# Patient Record
Sex: Female | Born: 1960 | Race: White | Hispanic: No | Marital: Married | Smoking: Former smoker
Health system: Southern US, Community
[De-identification: ages and names within clinical notes are randomized; demographics above are authoritative.]

## PROBLEM LIST (undated history)

## (undated) DIAGNOSIS — E739 Lactose intolerance, unspecified: Secondary | ICD-10-CM

## (undated) DIAGNOSIS — D649 Anemia, unspecified: Secondary | ICD-10-CM

## (undated) DIAGNOSIS — R0602 Shortness of breath: Secondary | ICD-10-CM

## (undated) DIAGNOSIS — E039 Hypothyroidism, unspecified: Secondary | ICD-10-CM

## (undated) DIAGNOSIS — E785 Hyperlipidemia, unspecified: Secondary | ICD-10-CM

## (undated) DIAGNOSIS — E538 Deficiency of other specified B group vitamins: Secondary | ICD-10-CM

## (undated) DIAGNOSIS — J189 Pneumonia, unspecified organism: Secondary | ICD-10-CM

## (undated) DIAGNOSIS — R011 Cardiac murmur, unspecified: Secondary | ICD-10-CM

## (undated) DIAGNOSIS — F410 Panic disorder [episodic paroxysmal anxiety] without agoraphobia: Secondary | ICD-10-CM

## (undated) DIAGNOSIS — E559 Vitamin D deficiency, unspecified: Secondary | ICD-10-CM

## (undated) DIAGNOSIS — R002 Palpitations: Secondary | ICD-10-CM

## (undated) DIAGNOSIS — I739 Peripheral vascular disease, unspecified: Secondary | ICD-10-CM

## (undated) DIAGNOSIS — M1990 Unspecified osteoarthritis, unspecified site: Secondary | ICD-10-CM

## (undated) DIAGNOSIS — I491 Atrial premature depolarization: Secondary | ICD-10-CM

## (undated) DIAGNOSIS — T4145XA Adverse effect of unspecified anesthetic, initial encounter: Secondary | ICD-10-CM

## (undated) DIAGNOSIS — K635 Polyp of colon: Secondary | ICD-10-CM

## (undated) DIAGNOSIS — G4730 Sleep apnea, unspecified: Secondary | ICD-10-CM

## (undated) DIAGNOSIS — I10 Essential (primary) hypertension: Secondary | ICD-10-CM

## (undated) HISTORY — DX: Palpitations: R00.2

## (undated) HISTORY — DX: Polyp of colon: K63.5

## (undated) HISTORY — DX: Hyperlipidemia, unspecified: E78.5

## (undated) HISTORY — PX: COLONOSCOPY: SHX174

## (undated) HISTORY — PX: OOPHORECTOMY: SHX86

## (undated) HISTORY — DX: Vitamin D deficiency, unspecified: E55.9

## (undated) HISTORY — DX: Deficiency of other specified B group vitamins: E53.8

## (undated) HISTORY — DX: Atrial premature depolarization: I49.1

---

## 1983-07-12 HISTORY — PX: APPENDECTOMY: SHX54

## 1989-07-11 DIAGNOSIS — T8859XA Other complications of anesthesia, initial encounter: Secondary | ICD-10-CM

## 1989-07-11 DIAGNOSIS — T4145XA Adverse effect of unspecified anesthetic, initial encounter: Secondary | ICD-10-CM

## 1989-07-11 HISTORY — DX: Adverse effect of unspecified anesthetic, initial encounter: T41.45XA

## 1989-07-11 HISTORY — DX: Other complications of anesthesia, initial encounter: T88.59XA

## 1997-07-11 HISTORY — PX: PLANTAR FASCIA SURGERY: SHX746

## 1999-07-12 HISTORY — PX: VAGINAL HYSTERECTOMY: SUR661

## 2005-04-28 ENCOUNTER — Ambulatory Visit: Payer: Self-pay | Admitting: Internal Medicine

## 2005-10-10 ENCOUNTER — Ambulatory Visit: Payer: Self-pay | Admitting: Internal Medicine

## 2006-03-02 ENCOUNTER — Ambulatory Visit: Payer: Self-pay | Admitting: General Practice

## 2006-04-20 ENCOUNTER — Ambulatory Visit: Payer: Self-pay | Admitting: Internal Medicine

## 2006-06-10 HISTORY — PX: ROUX-EN-Y GASTRIC BYPASS: SHX1104

## 2007-08-07 ENCOUNTER — Ambulatory Visit: Payer: Self-pay | Admitting: General Practice

## 2007-08-13 ENCOUNTER — Ambulatory Visit: Payer: Self-pay | Admitting: General Practice

## 2008-01-31 ENCOUNTER — Ambulatory Visit: Payer: Self-pay | Admitting: General Practice

## 2008-08-26 ENCOUNTER — Ambulatory Visit: Payer: Self-pay | Admitting: General Practice

## 2009-09-02 ENCOUNTER — Ambulatory Visit: Payer: Self-pay | Admitting: Internal Medicine

## 2010-03-24 ENCOUNTER — Ambulatory Visit: Payer: Self-pay | Admitting: Internal Medicine

## 2010-09-07 ENCOUNTER — Ambulatory Visit: Payer: Self-pay | Admitting: Internal Medicine

## 2010-12-09 ENCOUNTER — Ambulatory Visit: Payer: Self-pay | Admitting: General Practice

## 2010-12-30 ENCOUNTER — Encounter: Payer: Self-pay | Admitting: Rehabilitation

## 2011-01-09 ENCOUNTER — Encounter: Payer: Self-pay | Admitting: Rehabilitation

## 2011-02-09 ENCOUNTER — Encounter: Payer: Self-pay | Admitting: Rehabilitation

## 2011-05-03 ENCOUNTER — Ambulatory Visit: Payer: Self-pay | Admitting: Gastroenterology

## 2011-05-05 LAB — PATHOLOGY REPORT

## 2011-10-31 ENCOUNTER — Encounter: Payer: Self-pay | Admitting: Cardiovascular Disease

## 2011-11-14 ENCOUNTER — Encounter: Payer: Self-pay | Admitting: Cardiovascular Disease

## 2011-11-15 ENCOUNTER — Ambulatory Visit: Payer: Self-pay | Admitting: General Practice

## 2011-11-15 ENCOUNTER — Encounter: Payer: Self-pay | Admitting: Cardiovascular Disease

## 2011-11-17 ENCOUNTER — Encounter: Payer: Self-pay | Admitting: Cardiovascular Disease

## 2011-11-17 ENCOUNTER — Ambulatory Visit: Payer: Self-pay | Admitting: General Practice

## 2011-12-14 ENCOUNTER — Ambulatory Visit: Payer: Self-pay | Admitting: Internal Medicine

## 2012-01-02 ENCOUNTER — Encounter: Payer: Self-pay | Admitting: Cardiovascular Disease

## 2012-01-02 ENCOUNTER — Ambulatory Visit (INDEPENDENT_AMBULATORY_CARE_PROVIDER_SITE_OTHER): Payer: BC Managed Care – PPO | Admitting: Cardiovascular Disease

## 2012-01-02 VITALS — BP 122/78 | HR 67 | Ht 66.0 in | Wt 262.0 lb

## 2012-01-02 DIAGNOSIS — R609 Edema, unspecified: Secondary | ICD-10-CM

## 2012-01-02 DIAGNOSIS — R600 Localized edema: Secondary | ICD-10-CM

## 2012-01-02 DIAGNOSIS — R002 Palpitations: Secondary | ICD-10-CM

## 2012-01-02 NOTE — Progress Notes (Signed)
History of Present Illness: 51 yo WF with history of palpitations who is here today as a new patient for evaluation of her palpitations.  She has recently undergone cardiac testing arranged by her primary doctor in [place]. She has not seen cardiology at [facility]. She has undergone a Holter monitor on 11/17/11 which showed NSR with occasional PACs. Exercise stress myoview on 11/17/11 with no evidence of ischemia. LVEF was 72%. She exercised for 6 minutes achieving target heart rate. D-dimer was elevated at 0.7. Lower extremity venous dopplers were negative for DVT.   She endorses palpitations for many years. Feels her heart racing. Has worn several monitors over the years, most recently this showed PACs. No chest pain or SOB. No near syncope or syncope. She does endorse chronic, daily lower ext edema that improves at night.   Primary Care Physician: Jabier Mutton  Last Lipid Profile:10/31/11:  TC: 278 HDL: 73 LDL: 180 TG: 125  Past Medical History  Diagnosis Date  . Palpitations   . Hyperlipidemia     Past Surgical History  Procedure Date  . 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 Plantar fascia surgery 1999  . Cesarean section 1987    twins  . Appendectomy 1985  . Vaginal hysterectomy 2001    has 1 ovary left    Current Outpatient Prescriptions  Medication Sig Dispense Refill  . diltiazem (CARDIZEM CD) 180 MG 24 hr capsule Take 180 mg by mouth daily.      Marland Kitchen levothyroxine (SYNTHROID, LEVOTHROID) 100 MCG tablet Take 1 tablet by mouth daily.      . naproxen sodium (ANAPROX) 220 MG tablet Take 220 mg by mouth as needed.      . traMADol (ULTRAM) 50 MG tablet Take 1 tablet by mouth Three times daily as needed.      . Vitamin D, Ergocalciferol, (DRISDOL) 50000 UNITS CAPS Take 1 capsule by mouth Once a week.      . zolpidem (AMBIEN) 10 MG tablet Take 1 tablet by mouth as needed.        No Known Allergies  History   Social History  . Marital Status: Married    Spouse Name: N/A      Number of Children: 2  . Years of Education: N/A   Occupational History  . parking enforcement [employer]   Social History Main Topics  . Smoking status: Former Smoker -- 0.3 packs/day for 4 years    Quit date: 07/11/2002  . Smokeless tobacco: Not on file  . Alcohol Use: 0.5 oz/week    1 drink(s) per week     occasional beer and wine  . Drug Use: No  . Sexually Active: Not on file   Other Topics Concern  . Not on file   Social History Narrative  . No narrative on file    Family History  Problem Relation Age of Onset  . Heart disease Mother   . Heart disease Father   . Lung cancer Mother   . Arrhythmia Sister   . Diabetes Mother     later in life  . Coronary artery disease Mother     Review of Systems:  As stated in the HPI and otherwise negative.   BP 122/78  Pulse 67  Ht 5\' 6"  (1.676 m)  Wt 262 lb (118.842 kg)  BMI 42.29 kg/m2  Physical Examination: General: Well developed, well nourished, NAD HEENT: OP clear, mucus membranes moist SKIN: warm, dry. No rashes. Neuro: No focal deficits Musculoskeletal:  Muscle strength 5/5 all ext Psychiatric: Mood and affect normal Neck: No JVD, no carotid bruits, no thyromegaly, no lymphadenopathy. Lungs:Clear bilaterally, no wheezes, rhonci, crackles Cardiovascular: Regular rate and rhythm. No murmurs, gallops or rubs. Abdomen:Soft. Bowel sounds present. Non-tender.  Extremities: No lower extremity edema. Pulses are 2 + in the bilateral DP/PT.  EKG: NSR, rate 67 bpm.

## 2012-01-02 NOTE — Assessment & Plan Note (Signed)
Likely dependent edema. I have asked her to avoid salty foods, elevated legs and consider compression stockings. LE venous dopplers negative for DVT.

## 2012-01-02 NOTE — Patient Instructions (Addendum)
Your physician wants you to follow-up in:  6 months. You will receive a reminder letter in the mail two months in advance. If you don't receive a letter, please call our office to schedule the follow-up appointment.   

## 2012-01-02 NOTE — Assessment & Plan Note (Signed)
Likely due to PACs. She did not have any episodes while wearing her monitor so she could be having SVT. Symptoms are much better since she started Cardizem. LV function is normal on stress myoview. I have reassured her. She is asked to avoid any stimulants including caffeine, nicotine, over the conter medications with stimulants. If she has recurrence of symptoms, will set her up to wear a 21 day monitor.

## 2012-08-09 ENCOUNTER — Telehealth: Payer: Self-pay | Admitting: Cardiovascular Disease

## 2012-08-09 DIAGNOSIS — R002 Palpitations: Secondary | ICD-10-CM

## 2012-08-09 NOTE — Telephone Encounter (Signed)
Spoke with pt. She last saw Dr. Clifton James in June 2013. Plan was to have pt wear 21 day event monitor if symptoms reoccur and see her in 6 months.  She reports palpitations have increased in last couple of months.  Will arrange 21 day event monitor.  Will cancel appt scheduled for 6 month follow up on Feb. 5th and schedule for time when results of monitor will be available.

## 2012-08-09 NOTE — Telephone Encounter (Signed)
Agree. Thanks, chris 

## 2012-08-09 NOTE — Telephone Encounter (Signed)
Pt calling re poss having a event monitor, has dental appt at three so can be reached after 4p or before 300pm

## 2012-08-14 ENCOUNTER — Ambulatory Visit: Payer: BC Managed Care – PPO | Admitting: Cardiovascular Disease

## 2012-08-14 NOTE — Telephone Encounter (Signed)
Monitor appt --Feb. 6, 2014. Appt with Dr. Clifton James on September 17, 2012

## 2012-08-15 ENCOUNTER — Ambulatory Visit: Payer: BC Managed Care – PPO | Admitting: Cardiovascular Disease

## 2012-08-16 ENCOUNTER — Ambulatory Visit (INDEPENDENT_AMBULATORY_CARE_PROVIDER_SITE_OTHER): Payer: BC Managed Care – PPO

## 2012-08-16 DIAGNOSIS — R002 Palpitations: Secondary | ICD-10-CM

## 2012-08-16 NOTE — Progress Notes (Signed)
Placed a 30 day event monitor on patient and went over instructions on how to use it and when to return it 

## 2012-09-13 ENCOUNTER — Ambulatory Visit: Payer: BC Managed Care – PPO | Admitting: Cardiology

## 2012-09-17 ENCOUNTER — Ambulatory Visit (INDEPENDENT_AMBULATORY_CARE_PROVIDER_SITE_OTHER): Payer: BC Managed Care – PPO | Admitting: Cardiovascular Disease

## 2012-09-17 ENCOUNTER — Encounter: Payer: Self-pay | Admitting: Cardiovascular Disease

## 2012-09-17 VITALS — BP 118/80 | HR 64 | Ht 66.0 in | Wt 256.0 lb

## 2012-09-17 DIAGNOSIS — R600 Localized edema: Secondary | ICD-10-CM

## 2012-09-17 DIAGNOSIS — R002 Palpitations: Secondary | ICD-10-CM

## 2012-09-17 DIAGNOSIS — I491 Atrial premature depolarization: Secondary | ICD-10-CM

## 2012-09-17 DIAGNOSIS — R609 Edema, unspecified: Secondary | ICD-10-CM

## 2012-09-17 NOTE — Progress Notes (Signed)
History of Present Illness: 52 yo WF with history of palpitations who is here today for cardiac follow up. I saw her as a new patient for evaluation of her palpitations in June 2013. She had undergone cardiac testing arranged by her primary doctor in [place] in the spring of 2013. She had not seen cardiology at [facility]. Holter monitor on 11/17/11 showed NSR with occasional PACs. Exercise stress myoview on 11/17/11 with no evidence of ischemia. LVEF was 72%. She exercised for 6 minutes achieving target heart rate. D-dimer was elevated at 0.7. Lower extremity venous dopplers were negative for DVT.  She endorsed palpitations for many years. We planned to continue Cardizem and only pursue a 21 day event monitor if symptoms increased in frequency or severity.   She is here today for follow up. She called in last month with c/o worsened symptoms and we arranged an event monitor. This is not available yet for review. She describes almost daily symptoms, lasting 30 minutes with HR 140s. Mild chest tightness with palpitations. No near syncope or syncope.   Primary Care Physician: Jabier Mutton    Past Medical History  Diagnosis Date  . Palpitations   . Hyperlipidemia   . PAC (premature atrial contraction)     Past Surgical History  Procedure Laterality Date  . 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 Plantar fascia surgery  1999  . Cesarean section  1987    twins  . Appendectomy  1985  . Vaginal hysterectomy  2001    has 1 ovary left    Current Outpatient Prescriptions  Medication Sig Dispense Refill  . diltiazem (CARDIZEM CD) 180 MG 24 hr capsule Take 180 mg by mouth daily.      Marland Kitchen levothyroxine (SYNTHROID, LEVOTHROID) 100 MCG tablet Take 1 tablet by mouth daily.      . naproxen sodium (ANAPROX) 220 MG tablet Take 220 mg by mouth as needed.      . traMADol (ULTRAM) 50 MG tablet Take 1 tablet by mouth Three times daily as needed.      . traZODone (DESYREL) 50 MG tablet Take 50 mg by mouth  at bedtime.      . Vitamin D, Ergocalciferol, (DRISDOL) 50000 UNITS CAPS Take 1 capsule by mouth Once a week.      . zolpidem (AMBIEN) 5 MG tablet Take 5 mg by mouth at bedtime as needed for sleep.       No current facility-administered medications for this visit.    No Known Allergies  History   Social History  . Marital Status: Married    Spouse Name: N/A    Number of Children: 2  . Years of Education: N/A   Occupational History  . parking enforcement [employer]   Social History Main Topics  . Smoking status: Former Smoker -- 0.30 packs/day for 4 years    Quit date: 07/11/2002  . Smokeless tobacco: Not on file  . Alcohol Use: 0.5 oz/week    1 drink(s) per week     Comment: occasional beer and wine  . Drug Use: No  . Sexually Active: Not on file   Other Topics Concern  . Not on file   Social History Narrative  . No narrative on file    Family History  Problem Relation Age of Onset  . Heart disease Mother   . Heart disease Father   . Lung cancer Mother   . Arrhythmia Sister   . Diabetes Mother  later in life  . Coronary artery disease Mother     Review of Systems:  As stated in the HPI and otherwise negative.   BP 118/80  Pulse 64  Ht 5\' 6"  (1.676 m)  Wt 256 lb (116.121 kg)  BMI 41.34 kg/m2  Physical Examination: General: Well developed, well nourished, NAD HEENT: OP clear, mucus membranes moist SKIN: warm, dry. No rashes. Neuro: No focal deficits Musculoskeletal: Muscle strength 5/5 all ext Psychiatric: Mood and affect normal Neck: No JVD, no carotid bruits, no thyromegaly, no lymphadenopathy. Lungs:Clear bilaterally, no wheezes, rhonci, crackles Cardiovascular: Regular rate and rhythm. No murmurs, gallops or rubs. Abdomen:Soft. Bowel sounds present. Non-tender.  Extremities: No lower extremity edema. Pulses are 2 + in the bilateral DP/PT.  Assessment and Plan:   1. Palpitations: Felt to be due to PACs.  She has had recurrence of  symptoms with almost daily palpitations. Her pulse has been 140 to 150 when she is noticing palpitations. LV function is normal on stress myoview 2013. She called last month with c/o worsened symptoms and we arranged an event monitor. Unfortunately, she turned this in last week after multiple activations but the results are not available at this time. She will continue Cardizem for now. I will call her with the results of the monitor and we will plan medication adjustments or EP referral based on findings.   2. Lower extremity edema: Resolved. Likely dependent edema.  No charge today since she came to review her monitor but by our mistake it is not available.

## 2012-09-20 ENCOUNTER — Telehealth: Payer: Self-pay | Admitting: Cardiovascular Disease

## 2012-09-20 NOTE — Telephone Encounter (Signed)
New Problem:    Patient called in wanting to know if her Monitor Results were in.  Please call back

## 2012-09-20 NOTE — Telephone Encounter (Signed)
Thanks, chris 

## 2012-09-20 NOTE — Telephone Encounter (Signed)
I just received monitor results. I spoke with pt and gave her this information and told her Dr. Clifton James would need to review. I made pt aware Dr. Clifton James would be back in office on September 26, 2012. She would like Dr. Clifton James to call her after reviewing monitor.

## 2012-09-26 NOTE — Telephone Encounter (Signed)
Spoke with pt and told her Dr. Clifton James has report and will call her back later today

## 2012-09-26 NOTE — Telephone Encounter (Signed)
I spoke to the patient and gave her the results of her 21 day event monitor. cdm

## 2012-09-26 NOTE — Telephone Encounter (Signed)
Follow-up:    Patient called in wanting to know when Dr. Clifton James was going to call her back today.  Please call back.

## 2013-01-09 ENCOUNTER — Ambulatory Visit: Payer: Self-pay | Admitting: Internal Medicine

## 2013-03-18 ENCOUNTER — Ambulatory Visit: Payer: Self-pay | Admitting: General Practice

## 2013-04-05 ENCOUNTER — Ambulatory Visit: Payer: Self-pay | Admitting: General Practice

## 2013-04-10 ENCOUNTER — Ambulatory Visit: Payer: Self-pay | Admitting: Physician Assistant

## 2013-05-27 ENCOUNTER — Other Ambulatory Visit: Payer: Self-pay | Admitting: Neurological Surgery

## 2013-06-12 ENCOUNTER — Other Ambulatory Visit: Payer: Self-pay | Admitting: Neurological Surgery

## 2013-06-12 DIAGNOSIS — M4316 Spondylolisthesis, lumbar region: Secondary | ICD-10-CM

## 2013-06-14 ENCOUNTER — Ambulatory Visit
Admission: RE | Admit: 2013-06-14 | Discharge: 2013-06-14 | Disposition: A | Discharge status: Home / Self Care | Payer: BC Managed Care – PPO | Source: Ambulatory Visit | Attending: Neurological Surgery | Admitting: Neurological Surgery

## 2013-06-14 DIAGNOSIS — M4316 Spondylolisthesis, lumbar region: Secondary | ICD-10-CM

## 2013-06-26 ENCOUNTER — Encounter (HOSPITAL_COMMUNITY): Payer: Self-pay | Admitting: Pharmacy Technician

## 2013-07-01 ENCOUNTER — Encounter (HOSPITAL_COMMUNITY): Payer: Self-pay

## 2013-07-01 ENCOUNTER — Other Ambulatory Visit (HOSPITAL_COMMUNITY): Payer: Self-pay | Admitting: *Deleted

## 2013-07-01 ENCOUNTER — Encounter (HOSPITAL_COMMUNITY)
Admission: RE | Admit: 2013-07-01 | Discharge: 2013-07-01 | Disposition: A | Discharge status: Home / Self Care | Payer: BC Managed Care – PPO | Source: Ambulatory Visit | Attending: Neurological Surgery | Admitting: Neurological Surgery

## 2013-07-01 DIAGNOSIS — Z01810 Encounter for preprocedural cardiovascular examination: Secondary | ICD-10-CM | POA: Insufficient documentation

## 2013-07-01 DIAGNOSIS — Z01818 Encounter for other preprocedural examination: Secondary | ICD-10-CM | POA: Insufficient documentation

## 2013-07-01 DIAGNOSIS — Z01812 Encounter for preprocedural laboratory examination: Secondary | ICD-10-CM | POA: Insufficient documentation

## 2013-07-01 HISTORY — DX: Shortness of breath: R06.02

## 2013-07-01 HISTORY — DX: Peripheral vascular disease, unspecified: I73.9

## 2013-07-01 HISTORY — DX: Pneumonia, unspecified organism: J18.9

## 2013-07-01 HISTORY — DX: Sleep apnea, unspecified: G47.30

## 2013-07-01 HISTORY — DX: Hypothyroidism, unspecified: E03.9

## 2013-07-01 HISTORY — DX: Lactose intolerance, unspecified: E73.9

## 2013-07-01 HISTORY — DX: Unspecified osteoarthritis, unspecified site: M19.90

## 2013-07-01 HISTORY — DX: Anemia, unspecified: D64.9

## 2013-07-01 LAB — CBC WITH DIFFERENTIAL/PLATELET
Basophils Absolute: 0 10*3/uL (ref 0.0–0.1)
Basophils Relative: 0 % (ref 0–1)
Eosinophils Absolute: 0.1 10*3/uL (ref 0.0–0.7)
Eosinophils Relative: 1 % (ref 0–5)
HCT: 39.4 % (ref 36.0–46.0)
Hemoglobin: 12.4 g/dL (ref 12.0–15.0)
Lymphocytes Relative: 28 % (ref 12–46)
Lymphs Abs: 2.6 10*3/uL (ref 0.7–4.0)
MCH: 25.6 pg — ABNORMAL LOW (ref 26.0–34.0)
MCHC: 31.5 g/dL (ref 30.0–36.0)
MCV: 81.4 fL (ref 78.0–100.0)
Monocytes Absolute: 0.8 10*3/uL (ref 0.1–1.0)
Monocytes Relative: 8 % (ref 3–12)
Neutro Abs: 5.7 10*3/uL (ref 1.7–7.7)
Neutrophils Relative %: 62 % (ref 43–77)
Platelets: 270 10*3/uL (ref 150–400)
RBC: 4.84 MIL/uL (ref 3.87–5.11)
RDW: 16.8 % — ABNORMAL HIGH (ref 11.5–15.5)
WBC: 9.1 10*3/uL (ref 4.0–10.5)

## 2013-07-01 LAB — TYPE AND SCREEN
ABO/RH(D): B POS
Antibody Screen: NEGATIVE

## 2013-07-01 LAB — PROTIME-INR
INR: 0.97 (ref 0.00–1.49)
Prothrombin Time: 12.7 seconds (ref 11.6–15.2)

## 2013-07-01 LAB — BASIC METABOLIC PANEL
BUN: 13 mg/dL (ref 6–23)
CO2: 26 mEq/L (ref 19–32)
Calcium: 9 mg/dL (ref 8.4–10.5)
Chloride: 101 mEq/L (ref 96–112)
Creatinine, Ser: 0.67 mg/dL (ref 0.50–1.10)
GFR calc Af Amer: >90 mL/min (ref >90)
GFR calc non Af Amer: >90 mL/min (ref >90)
Glucose, Bld: 100 mg/dL — ABNORMAL HIGH (ref 70–99)
Potassium: 3.7 mEq/L (ref 3.5–5.1)
Sodium: 136 mEq/L (ref 135–145)

## 2013-07-01 LAB — ABO/RH: ABO/RH(D): B POS

## 2013-07-01 LAB — SURGICAL PCR SCREEN
MRSA, PCR: NEGATIVE
Staphylococcus aureus: NEGATIVE

## 2013-07-01 NOTE — Pre-Procedure Instructions (Signed)
Mary [name]  07/01/2013   Your procedure is scheduled on:  Wednesday, July 10, 2013 at 8:30 AM.   Report to [facility] Entrance "A" Admitting Office at 6:30 AM.   Call this number if you have problems the morning of surgery: [phone]   Remember:   Do not eat food or drink liquids after midnight Tuesday, 07/09/13.   Take these medicines the morning of surgery with A SIP OF WATER: levothyroxine (SYNTHROID, LEVOTHROID), traMADol (ULTRAM) - if needed.   Stop Mobic (Meloxicam) on Wednesday, 07/03/13.     Do not wear jewelry, make-up or nail polish.  Do not wear lotions, powders, or perfumes. You may wear deodorant.  Do not shave 48 hours prior to surgery.   Do not bring valuables to the hospital.  [facility] is not responsible                  for any belongings or valuables.               Contacts, dentures or bridgework may not be worn into surgery.  Leave suitcase in the car. After surgery it may be brought to your room.  For patients admitted to the hospital, discharge time is determined by your                treatment team.            Special Instructions: Shower using CHG 2 nights before surgery and the night before surgery.  If you shower the day of surgery use CHG.  Use special wash - you have one bottle of CHG for all showers.  You should use approximately 1/3 of the bottle for each shower.   Please read over the following fact sheets that you were given: Pain Booklet, Coughing and Deep Breathing, Blood Transfusion Information, MRSA Information and Surgical Site Infection Prevention

## 2013-07-09 MED ORDER — CEFAZOLIN SODIUM-DEXTROSE 2-3 GM-% IV SOLR
2.0000 g | INTRAVENOUS | Status: AC
  Administered 2013-07-10: 2 g via INTRAVENOUS
  Filled 2013-07-09: qty 50

## 2013-07-10 ENCOUNTER — Encounter (HOSPITAL_COMMUNITY): Payer: Self-pay | Admitting: *Deleted

## 2013-07-10 ENCOUNTER — Encounter (HOSPITAL_COMMUNITY)
Admission: RE | Disposition: A | Discharge status: Home / Self Care | Payer: Self-pay | Source: Ambulatory Visit | Attending: Neurological Surgery

## 2013-07-10 ENCOUNTER — Encounter (HOSPITAL_COMMUNITY): Payer: BC Managed Care – PPO | Admitting: Anesthesiology

## 2013-07-10 ENCOUNTER — Inpatient Hospital Stay (HOSPITAL_COMMUNITY)
Admission: RE | Admit: 2013-07-10 | Discharge: 2013-07-12 | DRG: 460 | Disposition: A | Discharge status: Home / Self Care | Payer: BC Managed Care – PPO | Source: Ambulatory Visit | Attending: Neurological Surgery | Admitting: Neurological Surgery

## 2013-07-10 ENCOUNTER — Inpatient Hospital Stay (HOSPITAL_COMMUNITY): Payer: BC Managed Care – PPO

## 2013-07-10 ENCOUNTER — Inpatient Hospital Stay (HOSPITAL_COMMUNITY): Payer: BC Managed Care – PPO | Admitting: Anesthesiology

## 2013-07-10 DIAGNOSIS — Z981 Arthrodesis status: Secondary | ICD-10-CM | POA: Insufficient documentation

## 2013-07-10 DIAGNOSIS — M4310 Spondylolisthesis, site unspecified: Principal | ICD-10-CM | POA: Diagnosis present

## 2013-07-10 HISTORY — DX: Panic disorder (episodic paroxysmal anxiety): F41.0

## 2013-07-10 HISTORY — PX: MAXIMUM ACCESS (MAS)POSTERIOR LUMBAR INTERBODY FUSION (PLIF) 1 LEVEL: SHX6368

## 2013-07-10 HISTORY — PX: LAMINECTOMY: SHX219

## 2013-07-10 HISTORY — DX: Adverse effect of unspecified anesthetic, initial encounter: T41.45XA

## 2013-07-10 SURGERY — FOR MAXIMUM ACCESS (MAS) POSTERIOR LUMBAR INTERBODY FUSION (PLIF) 1 LEVEL
Anesthesia: General | Site: Back

## 2013-07-10 MED ORDER — HYDROMORPHONE HCL PF 1 MG/ML IJ SOLN
INTRAMUSCULAR | Status: AC
  Administered 2013-07-10: 0.5 mg
  Filled 2013-07-10: qty 1

## 2013-07-10 MED ORDER — POTASSIUM CHLORIDE IN NACL 20-0.9 MEQ/L-% IV SOLN
INTRAVENOUS | Status: DC
  Administered 2013-07-10: 17:00:00 via INTRAVENOUS
  Filled 2013-07-10 (×5): qty 1000

## 2013-07-10 MED ORDER — ACETAMINOPHEN 650 MG RE SUPP: 650.0000 mg | RECTAL | Status: DC | PRN

## 2013-07-10 MED ORDER — LEVOTHYROXINE SODIUM 100 MCG PO TABS
100.0000 ug | ORAL_TABLET | Freq: Every day | ORAL | Status: DC
  Administered 2013-07-11 – 2013-07-12 (×2): 100 ug via ORAL
  Filled 2013-07-10 (×3): qty 1

## 2013-07-10 MED ORDER — THROMBIN 5000 UNITS EX SOLR
OROMUCOSAL | Status: DC | PRN
  Administered 2013-07-10: 09:00:00 via TOPICAL

## 2013-07-10 MED ORDER — CEFAZOLIN SODIUM 1-5 GM-% IV SOLN
1.0000 g | Freq: Three times a day (TID) | INTRAVENOUS | Status: AC
  Administered 2013-07-10 (×2): 1 g via INTRAVENOUS
  Filled 2013-07-10 (×2): qty 50

## 2013-07-10 MED ORDER — FENTANYL CITRATE 0.05 MG/ML IJ SOLN
INTRAMUSCULAR | Status: DC | PRN
  Administered 2013-07-10: 25 ug via INTRAVENOUS
  Administered 2013-07-10 (×2): 50 ug via INTRAVENOUS
  Administered 2013-07-10: 25 ug via INTRAVENOUS
  Administered 2013-07-10: 50 ug via INTRAVENOUS
  Administered 2013-07-10: 100 ug via INTRAVENOUS
  Administered 2013-07-10 (×2): 50 ug via INTRAVENOUS

## 2013-07-10 MED ORDER — SODIUM CHLORIDE 0.9 % IJ SOLN: 3.0000 mL | INTRAMUSCULAR | Status: DC | PRN

## 2013-07-10 MED ORDER — DEXAMETHASONE 4 MG PO TABS
4.0000 mg | ORAL_TABLET | Freq: Four times a day (QID) | ORAL | Status: DC
  Administered 2013-07-10 – 2013-07-12 (×4): 4 mg via ORAL
  Filled 2013-07-10 (×12): qty 1

## 2013-07-10 MED ORDER — PROPOFOL 10 MG/ML IV BOLUS
INTRAVENOUS | Status: DC | PRN
  Administered 2013-07-10: 180 mg via INTRAVENOUS

## 2013-07-10 MED ORDER — HYDROMORPHONE HCL PF 1 MG/ML IJ SOLN
INTRAMUSCULAR | Status: AC
  Filled 2013-07-10: qty 1

## 2013-07-10 MED ORDER — DEXAMETHASONE SODIUM PHOSPHATE 4 MG/ML IJ SOLN
4.0000 mg | Freq: Four times a day (QID) | INTRAMUSCULAR | Status: DC
  Administered 2013-07-11 – 2013-07-12 (×4): 4 mg via INTRAVENOUS
  Filled 2013-07-10 (×10): qty 1

## 2013-07-10 MED ORDER — LACTATED RINGERS IV SOLN
INTRAVENOUS | Status: DC | PRN
  Administered 2013-07-10 (×2): via INTRAVENOUS

## 2013-07-10 MED ORDER — ONDANSETRON HCL 4 MG/2ML IJ SOLN: 4.0000 mg | INTRAMUSCULAR | Status: DC | PRN

## 2013-07-10 MED ORDER — SUCCINYLCHOLINE CHLORIDE 20 MG/ML IJ SOLN
INTRAMUSCULAR | Status: DC | PRN
  Administered 2013-07-10: 100 mg via INTRAVENOUS

## 2013-07-10 MED ORDER — BUPIVACAINE HCL (PF) 0.25 % IJ SOLN
INTRAMUSCULAR | Status: DC | PRN
  Administered 2013-07-10: 5 mL

## 2013-07-10 MED ORDER — DEXAMETHASONE SODIUM PHOSPHATE 10 MG/ML IJ SOLN
10.0000 mg | INTRAMUSCULAR | Status: AC
  Administered 2013-07-10: 10 mg via INTRAVENOUS

## 2013-07-10 MED ORDER — PHENOL 1.4 % MT LIQD: 1.0000 | OROMUCOSAL | Status: DC | PRN

## 2013-07-10 MED ORDER — ONDANSETRON HCL 4 MG/2ML IJ SOLN: 4.0000 mg | Freq: Four times a day (QID) | INTRAMUSCULAR | Status: DC | PRN

## 2013-07-10 MED ORDER — DEXAMETHASONE SODIUM PHOSPHATE 10 MG/ML IJ SOLN
INTRAMUSCULAR | Status: AC
  Filled 2013-07-10: qty 1

## 2013-07-10 MED ORDER — CYCLOBENZAPRINE HCL 10 MG PO TABS
ORAL_TABLET | ORAL | Status: AC
  Filled 2013-07-10: qty 1

## 2013-07-10 MED ORDER — HYDROMORPHONE HCL PF 1 MG/ML IJ SOLN
0.5000 mg | INTRAMUSCULAR | Status: DC | PRN
  Administered 2013-07-10: 0.5 mg via INTRAVENOUS

## 2013-07-10 MED ORDER — SENNA 8.6 MG PO TABS
1.0000 | ORAL_TABLET | Freq: Two times a day (BID) | ORAL | Status: DC
  Administered 2013-07-10 – 2013-07-12 (×4): 8.6 mg via ORAL
  Filled 2013-07-10 (×5): qty 1

## 2013-07-10 MED ORDER — MORPHINE SULFATE 2 MG/ML IJ SOLN
1.0000 mg | INTRAMUSCULAR | Status: DC | PRN
  Administered 2013-07-10: 2 mg via INTRAVENOUS
  Filled 2013-07-10: qty 1

## 2013-07-10 MED ORDER — ARTIFICIAL TEARS OP OINT
TOPICAL_OINTMENT | OPHTHALMIC | Status: DC | PRN
  Administered 2013-07-10: 1 via OPHTHALMIC

## 2013-07-10 MED ORDER — ZOLPIDEM TARTRATE 5 MG PO TABS
5.0000 mg | ORAL_TABLET | Freq: Every day | ORAL | Status: DC
  Administered 2013-07-11: 5 mg via ORAL
  Filled 2013-07-10: qty 1

## 2013-07-10 MED ORDER — ACETAMINOPHEN 325 MG PO TABS
650.0000 mg | ORAL_TABLET | ORAL | Status: DC | PRN
  Administered 2013-07-11: 650 mg via ORAL
  Filled 2013-07-10: qty 2

## 2013-07-10 MED ORDER — OXYCODONE-ACETAMINOPHEN 5-325 MG PO TABS
1.0000 | ORAL_TABLET | ORAL | Status: DC | PRN
  Administered 2013-07-10 – 2013-07-12 (×7): 2 via ORAL
  Filled 2013-07-10 (×7): qty 2

## 2013-07-10 MED ORDER — OXYCODONE HCL 5 MG/5ML PO SOLN: 5.0000 mg | Freq: Once | ORAL | Status: AC | PRN

## 2013-07-10 MED ORDER — VITAMIN D (ERGOCALCIFEROL) 1.25 MG (50000 UNIT) PO CAPS: 50000.0000 [IU] | ORAL_CAPSULE | ORAL | Status: DC

## 2013-07-10 MED ORDER — HYDROMORPHONE HCL PF 1 MG/ML IJ SOLN
0.2500 mg | INTRAMUSCULAR | Status: DC | PRN
  Administered 2013-07-10 (×6): 0.5 mg via INTRAVENOUS

## 2013-07-10 MED ORDER — MIDAZOLAM HCL 5 MG/5ML IJ SOLN
INTRAMUSCULAR | Status: DC | PRN
  Administered 2013-07-10 (×2): 1 mg via INTRAVENOUS

## 2013-07-10 MED ORDER — SODIUM CHLORIDE 0.9 % IV SOLN: 250.0000 mL | INTRAVENOUS | Status: DC

## 2013-07-10 MED ORDER — CYCLOBENZAPRINE HCL 10 MG PO TABS
10.0000 mg | ORAL_TABLET | Freq: Three times a day (TID) | ORAL | Status: DC | PRN
  Administered 2013-07-10: 10 mg via ORAL
  Filled 2013-07-10: qty 1

## 2013-07-10 MED ORDER — CELECOXIB 200 MG PO CAPS
200.0000 mg | ORAL_CAPSULE | Freq: Two times a day (BID) | ORAL | Status: DC
  Administered 2013-07-10 – 2013-07-12 (×5): 200 mg via ORAL
  Filled 2013-07-10 (×6): qty 1

## 2013-07-10 MED ORDER — ONDANSETRON HCL 4 MG/2ML IJ SOLN
INTRAMUSCULAR | Status: DC | PRN
  Administered 2013-07-10: 4 mg via INTRAVENOUS

## 2013-07-10 MED ORDER — SODIUM CHLORIDE 0.9 % IJ SOLN
3.0000 mL | Freq: Two times a day (BID) | INTRAMUSCULAR | Status: DC
  Administered 2013-07-11: 3 mL via INTRAVENOUS

## 2013-07-10 MED ORDER — THROMBIN 20000 UNITS EX SOLR
CUTANEOUS | Status: DC | PRN
  Administered 2013-07-10: 09:00:00 via TOPICAL

## 2013-07-10 MED ORDER — MENTHOL 3 MG MT LOZG: 1.0000 | LOZENGE | OROMUCOSAL | Status: DC | PRN

## 2013-07-10 MED ORDER — LIDOCAINE HCL (CARDIAC) 20 MG/ML IV SOLN
INTRAVENOUS | Status: DC | PRN
  Administered 2013-07-10: 80 mg via INTRAVENOUS

## 2013-07-10 MED ORDER — ACETAMINOPHEN 10 MG/ML IV SOLN
INTRAVENOUS | Status: AC
  Administered 2013-07-10: 1000 mg via INTRAVENOUS
  Filled 2013-07-10: qty 100

## 2013-07-10 MED ORDER — TRAZODONE HCL 50 MG PO TABS
50.0000 mg | ORAL_TABLET | Freq: Every evening | ORAL | Status: DC | PRN
  Filled 2013-07-10: qty 1

## 2013-07-10 MED ORDER — 0.9 % SODIUM CHLORIDE (POUR BTL) OPTIME
TOPICAL | Status: DC | PRN
  Administered 2013-07-10: 1000 mL

## 2013-07-10 MED ORDER — OXYCODONE HCL 5 MG PO TABS
ORAL_TABLET | ORAL | Status: AC
  Filled 2013-07-10: qty 1

## 2013-07-10 MED ORDER — OXYCODONE HCL 5 MG PO TABS
5.0000 mg | ORAL_TABLET | Freq: Once | ORAL | Status: AC | PRN
  Administered 2013-07-10: 5 mg via ORAL

## 2013-07-10 MED ORDER — SODIUM CHLORIDE 0.9 % IR SOLN
Status: DC | PRN
  Administered 2013-07-10: 09:00:00

## 2013-07-10 SURGICAL SUPPLY — 67 items
BAG DECANTER FOR FLEXI CONT (MISCELLANEOUS) ×3 IMPLANT
BENZOIN TINCTURE PRP APPL 2/3 (GAUZE/BANDAGES/DRESSINGS) ×3 IMPLANT
BLADE SURG ROTATE 9660 (MISCELLANEOUS) IMPLANT
BONE MATRIX OSTEOCEL PRO MED (Bone Implant) ×3 IMPLANT
BUR MATCHSTICK NEURO 3.0 LAGG (BURR) ×3 IMPLANT
CAGE COROENT 9X9X23-4 (Cage) ×6 IMPLANT
CANISTER SUCT 3000ML (MISCELLANEOUS) ×3 IMPLANT
CLIP NEUROVISION LG (CLIP) ×3 IMPLANT
CONT SPEC 4OZ CLIKSEAL STRL BL (MISCELLANEOUS) ×6 IMPLANT
COVER BACK TABLE 24X17X13 BIG (DRAPES) IMPLANT
COVER TABLE BACK 60X90 (DRAPES) ×3 IMPLANT
DRAPE C-ARM 42X72 X-RAY (DRAPES) ×6 IMPLANT
DRAPE C-ARMOR (DRAPES) ×3 IMPLANT
DRAPE LAPAROTOMY 100X72X124 (DRAPES) ×3 IMPLANT
DRAPE POUCH INSTRU U-SHP 10X18 (DRAPES) ×3 IMPLANT
DRAPE SURG 17X23 STRL (DRAPES) ×3 IMPLANT
DRESSING TELFA 8X3 (GAUZE/BANDAGES/DRESSINGS) ×3 IMPLANT
DRSG OPSITE 4X5.5 SM (GAUZE/BANDAGES/DRESSINGS) ×3 IMPLANT
DURAPREP 26ML APPLICATOR (WOUND CARE) ×3 IMPLANT
ELECT REM PT RETURN 9FT ADLT (ELECTROSURGICAL) ×3
ELECTRODE REM PT RTRN 9FT ADLT (ELECTROSURGICAL) ×2 IMPLANT
EVACUATOR 1/8 PVC DRAIN (DRAIN) ×3 IMPLANT
GAUZE SPONGE 4X4 16PLY XRAY LF (GAUZE/BANDAGES/DRESSINGS) IMPLANT
GLOVE BIO SURGEON STRL SZ8 (GLOVE) ×6 IMPLANT
GLOVE BIOGEL PI IND STRL 7.0 (GLOVE) ×2 IMPLANT
GLOVE BIOGEL PI IND STRL 7.5 (GLOVE) ×4 IMPLANT
GLOVE BIOGEL PI IND STRL 8 (GLOVE) ×2 IMPLANT
GLOVE BIOGEL PI INDICATOR 7.0 (GLOVE) ×1
GLOVE BIOGEL PI INDICATOR 7.5 (GLOVE) ×2
GLOVE BIOGEL PI INDICATOR 8 (GLOVE) ×1
GLOVE ECLIPSE 7.0 STRL STRAW (GLOVE) ×3 IMPLANT
GLOVE ECLIPSE 7.5 STRL STRAW (GLOVE) ×9 IMPLANT
GLOVE SS BIOGEL STRL SZ 6.5 (GLOVE) ×6 IMPLANT
GLOVE SUPERSENSE BIOGEL SZ 6.5 (GLOVE) ×3
GOWN BRE IMP SLV AUR LG STRL (GOWN DISPOSABLE) ×6 IMPLANT
GOWN BRE IMP SLV AUR XL STRL (GOWN DISPOSABLE) ×3 IMPLANT
GOWN STRL REIN 2XL LVL4 (GOWN DISPOSABLE) IMPLANT
HEMOSTAT POWDER KIT SURGIFOAM (HEMOSTASIS) ×3 IMPLANT
KIT BASIN OR (CUSTOM PROCEDURE TRAY) ×3 IMPLANT
KIT NEEDLE NVM5 EMG ELECT (KITS) ×2 IMPLANT
KIT NEEDLE NVM5 EMG ELECTRODE (KITS) ×1
KIT ROOM TURNOVER OR (KITS) ×3 IMPLANT
MILL MEDIUM DISP (BLADE) ×3 IMPLANT
NEEDLE HYPO 25X1 1.5 SAFETY (NEEDLE) ×3 IMPLANT
NS IRRIG 1000ML POUR BTL (IV SOLUTION) ×3 IMPLANT
PACK LAMINECTOMY NEURO (CUSTOM PROCEDURE TRAY) ×3 IMPLANT
PAD ARMBOARD 7.5X6 YLW CONV (MISCELLANEOUS) ×9 IMPLANT
ROD 35MM (Rod) ×6 IMPLANT
SCREW LOCK (Screw) ×4 IMPLANT
SCREW LOCK FXNS SPNE MAS PL (Screw) ×8 IMPLANT
SCREW MAS PLIF 5.5X30 (Screw) ×2 IMPLANT
SCREW PAS PLIF 5X30 (Screw) ×4 IMPLANT
SCREW SHANK 5.0X35 (Screw) ×6 IMPLANT
SCREW TULIP 5.5 (Screw) ×6 IMPLANT
SPONGE LAP 4X18 X RAY DECT (DISPOSABLE) IMPLANT
SPONGE SURGIFOAM ABS GEL 100 (HEMOSTASIS) ×3 IMPLANT
STRIP CLOSURE SKIN 1/2X4 (GAUZE/BANDAGES/DRESSINGS) ×3 IMPLANT
SUT VIC AB 0 CT1 18XCR BRD8 (SUTURE) ×2 IMPLANT
SUT VIC AB 0 CT1 8-18 (SUTURE) ×1
SUT VIC AB 2-0 CP2 18 (SUTURE) ×3 IMPLANT
SUT VIC AB 3-0 SH 8-18 (SUTURE) ×3 IMPLANT
SYR 20ML ECCENTRIC (SYRINGE) ×3 IMPLANT
TOWEL OR 17X24 6PK STRL BLUE (TOWEL DISPOSABLE) ×3 IMPLANT
TOWEL OR 17X26 10 PK STRL BLUE (TOWEL DISPOSABLE) ×3 IMPLANT
TRAP SPECIMEN MUCOUS 40CC (MISCELLANEOUS) IMPLANT
TRAY FOLEY CATH 14FRSI W/METER (CATHETERS) ×3 IMPLANT
WATER STERILE IRR 1000ML POUR (IV SOLUTION) ×3 IMPLANT

## 2013-07-10 NOTE — Progress Notes (Signed)
Utilization Review Completed.Mary [name] T12/31/2014  

## 2013-07-10 NOTE — Anesthesia Postprocedure Evaluation (Signed)
Anesthesia Post Note  Patient: Mary [name]  Procedure(s) Performed: Procedure(s): FOR MAXIMUM ACCESS (MAS) POSTERIOR LUMBAR INTERBODY FUSION (PLIF) LUMBAR FOUR-FIVE  Anesthesia type: General  Patient location: PACU  Post pain: Pain level controlled and Adequate analgesia  Post assessment: Post-op Vital signs reviewed, Patient's Cardiovascular Status Stable, Respiratory Function Stable, Patent Airway and Pain level controlled  Last Vitals:  Filed Vitals:   07/10/13 1134  BP: 121/75  Pulse: 70  Temp:   Resp: 12    Post vital signs: Reviewed and stable  Level of consciousness: awake, alert  and oriented  Complications: No apparent anesthesia complications

## 2013-07-10 NOTE — Anesthesia Procedure Notes (Signed)
Procedure Name: Intubation Date/Time: 07/10/2013 8:24 AM Performed by: Gayla Medicus Pre-anesthesia Checklist: Patient identified, Patient being monitored, Emergency Drugs available, Timeout performed and Suction available Patient Re-evaluated:Patient Re-evaluated prior to inductionOxygen Delivery Method: Circle system utilized Preoxygenation: Pre-oxygenation with 100% oxygen Intubation Type: IV induction Ventilation: Mask ventilation without difficulty and Oral airway inserted - appropriate to patient size Laryngoscope Size: Mac and 3 Grade View: Grade I Tube type: Oral Tube size: 7.5 mm Number of attempts: 1 Airway Equipment and Method: Stylet Placement Confirmation: ETT inserted through vocal cords under direct vision,  positive ETCO2 and breath sounds checked- equal and bilateral Secured at: 21 cm Tube secured with: Tape Dental Injury: Teeth and Oropharynx as per pre-operative assessment

## 2013-07-10 NOTE — Transfer of Care (Signed)
Immediate Anesthesia Transfer of Care Note  Patient: Mary [name]  Procedure(s) Performed: Procedure(s) with comments: FOR MAXIMUM ACCESS (MAS) POSTERIOR LUMBAR INTERBODY FUSION (PLIF) LUMBAR FOUR-FIVE - FOR MAXIMUM ACCESS (MAS) POSTERIOR LUMBAR INTERBODY FUSION (PLIF) LUMBAR FOUR-FIVE  Patient Location: PACU  Anesthesia Type:General  Level of Consciousness: awake, alert  and oriented  Airway & Oxygen Therapy: Patient Spontanous Breathing and Patient connected to nasal cannula oxygen  Post-op Assessment: Report given to PACU RN, Post -op Vital signs reviewed and stable and Patient moving all extremities X 4  Post vital signs: Reviewed and stable  Complications: No apparent anesthesia complications

## 2013-07-10 NOTE — Anesthesia Preprocedure Evaluation (Signed)
Anesthesia Evaluation  Patient identified by MRN, date of birth, ID band Patient awake    Reviewed: Allergy & Precautions, H&P , NPO status , Patient's Chart, lab work & pertinent test results  Airway Mallampati: III  Neck ROM: full    Dental   Pulmonary shortness of breath, sleep apnea , former smoker,          Cardiovascular + Peripheral Vascular Disease     Neuro/Psych Anxiety    GI/Hepatic   Endo/Other  Hypothyroidism Morbid obesity  Renal/GU      Musculoskeletal   Abdominal   Peds  Hematology   Anesthesia Other Findings   Reproductive/Obstetrics                           Anesthesia Physical Anesthesia Plan  ASA: II  Anesthesia Plan: General   Post-op Pain Management:    Induction: Intravenous  Airway Management Planned: Oral ETT  Additional Equipment:   Intra-op Plan:   Post-operative Plan: Extubation in OR  Informed Consent: I have reviewed the patients History and Physical, chart, labs and discussed the procedure including the risks, benefits and alternatives for the proposed anesthesia with the patient or authorized representative who has indicated his/her understanding and acceptance.     Plan Discussed with: CRNA, Anesthesiologist and Surgeon  Anesthesia Plan Comments:         Anesthesia Quick Evaluation

## 2013-07-10 NOTE — Preoperative (Signed)
Beta Blockers   Reason not to administer Beta Blockers:Not Applicable 

## 2013-07-10 NOTE — Evaluation (Signed)
Occupational Therapy Evaluation Patient Details Name: Mary [name] MRN: [id number] DOB: 07-21-1960 Today's Date: 07/10/2013 Time: 4098-1191 OT Time Calculation (min): 23 min  OT Assessment / Plan / Recommendation History of present illness 52 y.o. s/p FOR MAXIMUM ACCESS (MAS) POSTERIOR LUMBAR INTERBODY FUSION (PLIF) LUMBAR FOUR-FIVE   Clinical Impression   Pt presents with below problem list. Pt independent with ADLs, PTA. Pt will benefit from acute OT to increase independence prior to d/c.     OT Assessment  Patient needs continued OT Services    Follow Up Recommendations  No OT follow up;Supervision/Assistance - 24 hour    Barriers to Discharge      Equipment Recommendations  3 in 1 bedside comode;Other (comment) (AE kit)    Recommendations for Other Services    Frequency  Min 2X/week    Precautions / Restrictions Precautions Precautions: Back;Fall Precaution Booklet Issued: Yes (comment) Precaution Comments: Reviewed precautions with pt Required Braces or Orthoses: Spinal Brace Spinal Brace: Lumbar corset;Applied in sitting position   Pertinent Vitals/Pain Pain 8/10 at end of session. Repositioned.     ADL  Eating/Feeding: Independent Where Assessed - Eating/Feeding: Chair Grooming: Set up;Supervision/safety Where Assessed - Grooming: Supported sitting Lower Body Dressing: Minimal assistance Where Assessed - Lower Body Dressing: Supported sit to stand Toilet Transfer: Hydrographic surveyor Method: Sit to Barista: Comfort height toilet;Grab bars Toileting - Architect and Hygiene: Minimal assistance Where Assessed - Engineer, mining and Hygiene: Sit to stand from 3-in-1 or toilet Tub/Shower Transfer Method: Not assessed Equipment Used: Back brace;Gait belt;Sock aid;Reacher;Long-handled shoe horn;Long-handled sponge Transfers/Ambulation Related to ADLs: Pt using IV pole for ambulation. Min A for sit to stand  from bed and Min guard for sit <> stand to/from toilet. ADL Comments: Educated on AE for LB ADLs. Pt practiced with reacher and sockaid to don/doff sock. Educated on use of two cups for teeth care and placing items on right side of sink to avoid breaking precautions. Educated on donning back brace. Also, showed pt toilet aid for hygiene. Min A to help pt clean more thoroughly after hygiene-cues for precautions.    OT Diagnosis: Acute pain  OT Problem List: Decreased strength;Decreased range of motion;Decreased activity tolerance;Impaired balance (sitting and/or standing);Decreased knowledge of use of DME or AE;Decreased knowledge of precautions;Pain OT Treatment Interventions: Self-care/ADL training;DME and/or AE instruction;Therapeutic activities;Patient/family education;Balance training   OT Goals(Current goals can be found in the care plan section) Acute Rehab OT Goals Patient Stated Goal: not stated OT Goal Formulation: With patient Time For Goal Achievement: 07/17/13 Potential to Achieve Goals: Good ADL Goals Pt Will Perform Grooming: with modified independence;standing Pt Will Perform Lower Body Bathing: with modified independence;sit to/from stand;with adaptive equipment Pt Will Perform Lower Body Dressing: with modified independence;with adaptive equipment;sit to/from stand Pt Will Transfer to Toilet: with modified independence;ambulating (3 in 1 over commode) Pt Will Perform Toileting - Clothing Manipulation and hygiene: with modified independence;sit to/from stand Pt Will Perform Tub/Shower Transfer: Tub transfer;with supervision;ambulating;rolling walker (tub equipment tbd) Additional ADL Goal #1: Pt will don/doff brace independently while maintaining back precautions.  Additional ADL Goal #2: Pt will independently verbalize and demonstrate 3/3 back precautions.   Visit Information  Last OT Received On: 07/10/13 Assistance Needed: +1 History of Present Illness: 52 y.o. s/p FOR  MAXIMUM ACCESS (MAS) POSTERIOR LUMBAR INTERBODY FUSION (PLIF) LUMBAR FOUR-FIVE       Prior Functioning     Home Living Family/patient expects to be discharged to:: Private residence Living  Arrangements: Spouse/significant other Available Help at Discharge: Family;Available 24 hours/day Type of Home: House Home Access: Stairs to enter [employer] of Steps: 3 Entrance Stairs-Rails: Right;Left;Can reach both Home Layout: One level Home Equipment:  (reports she has access to some DME) Prior Function Level of Independence: Independent Communication Communication: No difficulties Dominant Hand: Right         Vision/Perception     Cognition  Cognition Arousal/Alertness: Awake/alert Behavior During Therapy: WFL for tasks assessed/performed Overall Cognitive Status: Within Functional Limits for tasks assessed    Extremity/Trunk Assessment Upper Extremity Assessment Upper Extremity Assessment: Overall WFL for tasks assessed Lower Extremity Assessment Lower Extremity Assessment: Defer to PT evaluation     Mobility Bed Mobility Bed Mobility: Rolling Left;Left Sidelying to Sit;Sitting - Scoot to Edge of Bed Rolling Left: 4: Min assist;With rail Left Sidelying to Sit: 4: Min assist;HOB flat Sitting - Scoot to Edge of Bed: 4: Min guard Details for Bed Mobility Assistance: Cues for technique. Assist to lift trunk and to help roll. Transfers Transfers: Sit to Stand;Stand to Sit Sit to Stand: 4: Min assist;4: Min guard;With upper extremity assist;From bed;From toilet Stand to Sit: 4: Min guard;With upper extremity assist;To chair/3-in-1;To toilet Details for Transfer Assistance: Min A for sit to stand from bed. Cues for technique.     Exercise     Balance     End of Session OT - End of Session Equipment Utilized During Treatment: Gait belt Activity Tolerance: Patient tolerated treatment well Patient left: in chair;with call bell/phone within reach;with  family/visitor present  GO     Earlie Raveling OTR/L [phone] 07/10/2013, 5:49 PM

## 2013-07-10 NOTE — H&P (Signed)
Subjective: Patient is a 52 y.o. female admitted for PLIF L4-5. Onset of symptoms was several months ago, gradually worsening since that time.  The pain is rated severe, and is located at the across the lower back and radiates to legs. The pain is described as aching and occurs all day. The symptoms have been progressive. Symptoms are exacerbated by exercise. 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   Past Medical History  Diagnosis Date  . Palpitations   . Hyperlipidemia   . PAC (premature atrial contraction)   . Hypothyroidism   . Pneumonia     "walking pneumonia"  . Shortness of breath     going upstairs (due to weight)  . Sleep apnea     after weight loss does not have to use cpap  . Peripheral vascular disease     "small" clot after thermal ablasion  . Arthritis   . Anemia     low iron at times  . Lactose intolerance   . Complication of anesthesia     Pt reports she awoke during plantar fascia surgery and felt "everything"  . Panic attacks     Pt reports restrictive clothing/areas and dry mouth cause her to have attack.    Past Surgical History  Procedure Laterality Date  . 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 Plantar fascia surgery  1999  . Cesarean section  1987    twins  . Appendectomy  1985  . Vaginal hysterectomy  2001    has 1 ovary left  . Colonoscopy      Prior to Admission medications   Medication Sig Start Date End Date Taking? Authorizing Provider  levothyroxine (SYNTHROID, LEVOTHROID) 100 MCG tablet Take 100 mcg by mouth daily before breakfast.   Yes Historical Provider, MD  meloxicam (MOBIC) 7.5 MG tablet Take 7.5 mg by mouth 2 (two) times daily as needed for pain.   Yes Historical Provider, MD  oxyCODONE-acetaminophen (PERCOCET/ROXICET) 5-325 MG per tablet Take 1 tablet by mouth at bedtime as needed for severe pain.   Yes Historical Provider, MD  traMADol (ULTRAM) 50 MG tablet Take by mouth every 6 (six) hours as needed.   Yes Historical  Provider, MD  traMADol (ULTRAM) 50 MG tablet Take 50 mg by mouth 3 (three) times daily as needed for moderate pain.   Yes Historical Provider, MD  traZODone (DESYREL) 50 MG tablet Take 50 mg by mouth at bedtime as needed for sleep.   Yes Historical Provider, MD  Vitamin D, Ergocalciferol, (DRISDOL) 50000 UNITS CAPS capsule Take 50,000 Units by mouth every Monday.   Yes Historical Provider, MD  zolpidem (AMBIEN) 10 MG tablet Take 10 mg by mouth at bedtime.   Yes Historical Provider, MD   No Known Allergies  History  Substance Use Topics  . Smoking status: Former Smoker -- 0.30 packs/day for 4 years    Quit date: 07/11/2002  . Smokeless tobacco: Never Used  . Alcohol Use: 0.5 oz/week    1 drink(s) per week     Comment: occasional beer and wine    Family History  Problem Relation Age of Onset  . Heart disease Mother   . Lung cancer Mother   . Diabetes Mother     later in life  . Coronary artery disease Mother   . Heart disease Father   . Arrhythmia Sister      Review of Systems  Positive ROS: neg  All other systems have been reviewed and were otherwise negative  with the exception of those mentioned in the HPI and as above.  Objective: Vital signs in last 24 hours: Temp:  [98.5 F (36.9 C)] 98.5 F (36.9 C) (12/31 0703) Pulse Rate:  [64] 64 (12/31 0703) Resp:  [18] 18 (12/31 0703) BP: (132)/(60) 132/60 mmHg (12/31 0703) SpO2:  [98 %] 98 % (12/31 0703)  General Appearance: Alert, cooperative, no distress, appears stated age Head: Normocephalic, without obvious abnormality, atraumatic Eyes: PERRL, conjunctiva/corneas clear, EOM's intact    Neck: Supple, symmetrical, trachea midline Back: Symmetric, no curvature, ROM normal, no CVA tenderness Lungs:  respirations unlabored Heart: Regular rate and rhythm Abdomen: Soft, non-tender Extremities: Extremities normal, atraumatic, no cyanosis or edema Pulses: 2+ and symmetric all extremities Skin: Skin color, texture, turgor  normal, no rashes or lesions  NEUROLOGIC:   Mental status: Alert and oriented x4,  no aphasia, good attention span, fund of knowledge, and memory Motor Exam - grossly normal Sensory Exam - grossly normal Reflexes: 1+ Coordination - grossly normal Gait - grossly normal Balance - grossly normal Cranial Nerves: I: smell Not tested  II: visual acuity  OS: nl    OD: nl  II: visual fields Full to confrontation  II: pupils Equal, round, reactive to light  III,VII: ptosis None  III,IV,VI: extraocular muscles  Full ROM  V: mastication Normal  V: facial light touch sensation  Normal  V,VII: corneal reflex  Present  VII: facial muscle function - upper  Normal  VII: facial muscle function - lower Normal  VIII: hearing Not tested  IX: soft palate elevation  Normal  IX,X: gag reflex Present  XI: trapezius strength  5/5  XI: sternocleidomastoid strength 5/5  XI: neck flexion strength  5/5  XII: tongue strength  Normal    Data Review Lab Results  Component Value Date   WBC 9.1 07/01/2013   HGB 12.4 07/01/2013   HCT 39.4 07/01/2013   MCV 81.4 07/01/2013   PLT 270 07/01/2013   Lab Results  Component Value Date   NA 136 07/01/2013   K 3.7 07/01/2013   CL 101 07/01/2013   CO2 26 07/01/2013   BUN 13 07/01/2013   CREATININE 0.67 07/01/2013   GLUCOSE 100* 07/01/2013   Lab Results  Component Value Date   INR 0.97 07/01/2013    Assessment/Plan: Patient admitted for PLIF L4-5. Patient has failed a reasonable attempt at conservative therapy.  I explained the condition and procedure to the patient and answered any questions.  Patient wishes to proceed with procedure as planned. Understands risks/ benefits and typical outcomes of procedure.   Geral Coker S 07/10/2013 7:50 AM

## 2013-07-10 NOTE — Progress Notes (Signed)
Foley cath removed. Pt has voided twice since removal.

## 2013-07-10 NOTE — Progress Notes (Signed)
Patient ID: Mary [name], female   DOB: 1960/09/13, 52 y.o.   MRN: [id number] Looks good post-op. Back sore, no leg pain, good strength

## 2013-07-10 NOTE — Op Note (Signed)
07/10/2013  11:25 AM  PATIENT:  Mary [name]  52 y.o. female  PRE-OPERATIVE DIAGNOSIS:  L4-5 spondylolisthesis with stenosis, back and leg pain  POST-OPERATIVE DIAGNOSIS:  Same  PROCEDURE:   1. Decompressive lumbar laminectomy L4-5  (gill-type)requiring more work than would be required for a simple exposure of the disk for PLIF in order to adequately decompress the neural elements and address the spinal stenosis 2. Posterior lumbar interbody fusion L4-5 using PEEK interbody cages packed with morcellized allograft and autograft 3. Posterior fixation L4-5 using cortical pedicle screws.    SURGEON:  Marikay Alar, MD  ASSISTANTS: Dr. Conchita Paris  ANESTHESIA:  General  EBL: 250 ml  Total I/O In: 1300 [I.V.:1300] Out: 760 [Urine:510; Blood:250]  BLOOD ADMINISTERED:none  DRAINS: Hemovac   INDICATION FOR PROCEDURE: This patient presented with severe back pain and bilateral leg pain. She tried medical management for quite some time without relief. She had an MRI CT scan which showed grade 1 spondylolisthesis L4-5 with severe facet arthropathy and spinal stenosis. I recommended a decompression instrumented fusion to address her segmental instability as well as her spinal stenosis. Patient understood the risks, benefits, and alternatives and potential outcomes and wished to proceed.  PROCEDURE DETAILS:  The patient was brought to the operating room. After induction of generalized endotracheal anesthesia the patient was rolled into the prone position on chest rolls and all pressure points were padded. The patient's lumbar region was cleaned and then prepped with DuraPrep and draped in the usual sterile fashion. Anesthesia was injected and then a dorsal midline incision was made and carried down to the lumbosacral fascia. The fascia was opened and the paraspinous musculature was taken down in a subperiosteal fashion to expose L45. A self-retaining retractor was placed. Intraoperative fluoroscopy  confirmed my level, and I started with placement of the L4 cortical pedicle screws. The pedicle screw entry zones were identified utilizing surface landmarks and  AP and lateral fluoroscopy. I scored the cortex with the high-speed drill and then used the hand drill and EMG monitoring to drill an upward and outward direction into the pedicle. I then tapped line to line, and the tap was also monitored. I then placed a 5-0 x 35 mm cortical pedicle screw into the pedicles of L4 bilaterally. I then turned my attention to the decompression and the spinous process was removed and complete lumbar laminectomies, hemi- facetectomies, and foraminotomies were performed at L4-5. The patient had significant spinal stenosis and this required more work than would be required for a simple exposure of the disc for posterior lumbar interbody fusion. Much more generous decompression was undertaken in order to adequately decompress the neural elements and address the patient's leg pain. The yellow ligament was removed to expose the underlying dura and nerve roots, and generous foraminotomies were performed to adequately decompress the neural elements. Both the exiting and traversing nerve roots were decompressed on both sides until a coronary dilator passed easily along the nerve roots. Once the decompression was complete, I turned my attention to the posterior lower lumbar interbody fusion. The epidural venous vasculature was coagulated and cut sharply. Disc space was incised and the initial discectomy was performed with pituitary rongeurs. The disc space was distracted with sequential distractors to a height of 9 mm. We then used a series of scrapers and shavers to prepare the endplates for fusion. The midline was prepared with Epstein curettes. Once the complete discectomy was finished, we packed an appropriate sized peek interbody cage with local autograft and morcellized  allograft, gently retracted the nerve root, and tapped the  cage into position at L4-5.  The midline between the cages was packed with morselized autograft and allograft. We then turned our attention to the placement of the lower pedicle screws. The pedicle screw entry zones were identified utilizing surface landmarks and fluoroscopy. I drilled into each pedicle utilizing the hand drill and EMG monitoring, and tapped each pedicle with the appropriate tap. We palpated with a ball probe to assure no break in the cortex. We then placed 5-0 x 30 mm cortical pedicle screws into the pedicles bilaterally at L5.  We then placed lordotic rods into the multiaxial screw heads of the pedicle screws and locked these in position with the locking caps and anti-torque device. We then checked our construct with AP and lateral fluoroscopy. Irrigated with copious amounts of bacitracin-containing saline solution. Placed a medium Hemovac drain through separate stab incision. Inspected the nerve roots once again to assure adequate decompression, lined to the dura with Gelfoam, and closed the muscle and the fascia with 0 Vicryl. Closed the subcutaneous tissues with 2-0 Vicryl and subcuticular tissues with 3-0 Vicryl. The skin was closed with benzoin and Steri-Strips. Dressing was then applied, the patient was awakened from general anesthesia and transported to the recovery room in stable condition. At the end of the procedure all sponge, needle and instrument counts were correct.   PLAN OF CARE: Admit to inpatient   PATIENT DISPOSITION:  PACU - hemodynamically stable.   Delay start of Pharmacological VTE agent (>24hrs) due to surgical blood loss or risk of bleeding:  yes

## 2013-07-10 NOTE — OR Nursing (Signed)
Ms. Gordin with 9/10 back pain, gripping the handrail of stretcher despite Dilaudid 2mg  IV, Oxy IR 5mg , and Flexeril 10 mg.  Dr. Chaney Malling notified and additional Dilaudid ordered up to a total of 4mg  IV.

## 2013-07-11 ENCOUNTER — Encounter (HOSPITAL_COMMUNITY): Payer: Self-pay | Admitting: General Practice

## 2013-07-11 MED ORDER — MAGNESIUM HYDROXIDE 400 MG/5ML PO SUSP
15.0000 mL | Freq: Two times a day (BID) | ORAL | Status: DC | PRN
  Administered 2013-07-11: 15 mL via ORAL
  Filled 2013-07-11: qty 30

## 2013-07-11 NOTE — Care Management Note (Signed)
CARE MANAGEMENT NOTE 07/11/2013    Patient:  Mary [name],Mary [name]   Account Number:  [id number]  Date Initiated:  07/11/2013  Documentation initiated by:  Ricki Miller  Subjective/Objective Assessment:   53 yr old female s/p L4-5 Lumbar laminectomy.     Action/Plan:   DME ordered, will need orders for [facility] from MD   Anticipated DC Date:     Anticipated DC Plan:  [place]         Choice offered to / List presented to:     DME arranged  [place]  3-N-1           Status of service:  In process, will continue to follow   Discharge Disposition:    Per UR Regulation:    If discussed at Long Length of Stay Meetings, dates discussed:    Comments:

## 2013-07-11 NOTE — Progress Notes (Signed)
Patient ID: Mary [name], female   DOB: 1960-11-25, 53 y.o.   MRN: [id number] Subjective:  The patient is alert and pleasant. She looks well. She wants to stay until tomorrow.  Objective: Vital signs in last 24 hours: Temp:  [97 F (36.1 C)-98.1 F (36.7 C)] 98.1 F (36.7 C) (01/01 0518) Pulse Rate:  [51-79] 52 (01/01 0518) Resp:  [12-20] 18 (01/01 0518) BP: (108-137)/(55-87) 124/59 mmHg (01/01 0518) SpO2:  [94 %-99 %] 97 % (01/01 0518)  Intake/Output from previous day: 12/31 0701 - 01/01 0700 In: 2117.5 [I.V.:1802.5] Out: 853 [Urine:513; Drains:90; Blood:250] Intake/Output this shift:    Physical exam patient is alert and oriented. She is moving her lower extremities well.  Lab Results: No results found for this basename: WBC, HGB, HCT, PLT,  in the last 72 hours BMET No results found for this basename: NA, K, CL, CO2, GLUCOSE, BUN, CREATININE, CALCIUM,  in the last 72 hours  Studies/Results: Dg Lumbar Spine 2-3 Views  07/10/2013   CLINICAL DATA:  L4-5 posterior fusion.  EXAM: LUMBAR SPINE - 2-3 VIEW; DG C-ARM 1-60 MIN  COMPARISON:  CT 06/14/2013  FINDINGS: 2 intraoperative spot images demonstrate changes of posterior fusion at L4-5. No hardware or bony complicating feature. Normal alignment.  IMPRESSION: Posterior fusion L4-5.   Electronically Signed   By: Rolm Baptise M.D.   On: 07/10/2013 14:34   Dg C-arm 1-60 Min  07/10/2013   CLINICAL DATA:  L4-5 posterior fusion.  EXAM: LUMBAR SPINE - 2-3 VIEW; DG C-ARM 1-60 MIN  COMPARISON:  CT 06/14/2013  FINDINGS: 2 intraoperative spot images demonstrate changes of posterior fusion at L4-5. No hardware or bony complicating feature. Normal alignment.  IMPRESSION: Posterior fusion L4-5.   Electronically Signed   By: Rolm Baptise M.D.   On: 07/10/2013 14:34    Assessment/Plan:  postop day #1: The patient is doing well. We will mobilize her with PT. We will discontinue her Hemovac drain and Hep-Lock her IV.   LOS: 1 day      Mary [name] D 07/11/2013, 10:09 AM

## 2013-07-11 NOTE — Evaluation (Signed)
Physical Therapy Evaluation Patient Details Name: Mary [name] MRN: [id number] DOB: 1960-11-27 Today's Date: 07/11/2013 Time: 4098-1191 PT Time Calculation (min): 26 min  PT Assessment / Plan / Recommendation History of Present Illness  53 y.o. s/p FOR MAXIMUM ACCESS (MAS) POSTERIOR LUMBAR INTERBODY FUSION (PLIF) LUMBAR FOUR-FIVE  Clinical Impression  Pt mobilizing very well, requires ~min assist for most tasks including ambulation and stairs. Pt motivated to improve. Will benefit from acute PT to address deficits listed below. Husband to provide 24 hour supervision upon D/C home.     PT Assessment  Patient needs continued PT services    Follow Up Recommendations  Home health PT;Supervision/Assistance - 24 hour          Equipment Recommendations  Rolling walker with 5" wheels;3in1 (PT)       Frequency Min 6X/week    Precautions / Restrictions Precautions Precautions: Back;Fall Precaution Booklet Issued: Yes (comment) Precaution Comments: Reviewed precautions with pt, pt recalled 2/3 Required Braces or Orthoses: Spinal Brace Spinal Brace: Lumbar corset;Applied in sitting position   Pertinent Vitals/Pain 4/10 low back pain, surgical. Premedicated       Mobility  Bed Mobility Bed Mobility: Sitting - Scoot to Edge of Bed;Rolling Right;Right Sidelying to Sit Rolling Right: 5: Supervision Right Sidelying to Sit: 5: Supervision;With rails Sitting - Scoot to Edge of Bed: 4: Min guard Details for Bed Mobility Assistance: Cues for log roll technique.  Transfers Transfers: Sit to Stand;Stand to Sit Sit to Stand: 4: Min guard;With upper extremity assist;From bed Stand to Sit: 4: Min guard;With upper extremity assist;To chair/3-in-1 Details for Transfer Assistance: Cues for precautions, cues for pre-stand mechanics. Min-guard to steady pt.  Ambulation/Gait Ambulation/Gait Assistance: 4: Min guard Ambulation Distance (Feet): 350 Feet Assistive device: Rolling walker Gait  Pattern: Step-through pattern (min foot clearance.) Stairs: Yes Stairs Assistance: 4: Min guard (cues for sequencing and safety. Husband educated on positioning and RW management) Stair Management Technique: Step to pattern;Two rails;Forwards        PT Diagnosis: Difficulty walking;Abnormality of gait;Generalized weakness;Acute pain  PT Problem List: Decreased strength;Decreased activity tolerance;Decreased balance;Decreased mobility;Decreased knowledge of precautions;Pain PT Treatment Interventions: DME instruction;Gait training;Stair training;Functional mobility training;Therapeutic activities;Therapeutic exercise;Balance training;Neuromuscular re-education;Patient/family education     PT Goals(Current goals can be found in the care plan section) Acute Rehab PT Goals Patient Stated Goal: Go home soon as possible PT Goal Formulation: With patient Time For Goal Achievement: 07/18/13 Potential to Achieve Goals: Good  Visit Information  Last PT Received On: 07/11/13 Assistance Needed: +1 History of Present Illness: 53 y.o. s/p FOR MAXIMUM ACCESS (MAS) POSTERIOR LUMBAR INTERBODY FUSION (PLIF) LUMBAR FOUR-FIVE       Prior [place] expects to be discharged to:: Private residence Living Arrangements: Spouse/significant other Available Help at Discharge: Family;Available 24 hours/day Type of Home: House Home Access: Stairs to enter [employer] of Steps: 3 Entrance Stairs-Rails: Right;Left;Can reach both Home Layout: One level Home Equipment:  (reports she has access to some DME) Prior Function Level of Independence: Independent Communication Communication: No difficulties Dominant Hand: Right    Cognition  Cognition Arousal/Alertness: Awake/alert Behavior During Therapy: WFL for tasks assessed/performed Overall Cognitive Status: Within Functional Limits for tasks assessed    Extremity/Trunk Assessment Upper Extremity  Assessment Upper Extremity Assessment: Defer to OT evaluation Lower Extremity Assessment Lower Extremity Assessment: Generalized weakness Cervical / Trunk Assessment Cervical / Trunk Assessment: Normal   Balance Balance Balance Assessed: Yes Dynamic Standing Balance Dynamic Standing - Balance Support: No upper extremity  supported Dynamic Standing - Level of Assistance: 4: Min assist  End of Session PT - End of Session Equipment Utilized During Treatment: Gait belt;Back brace Activity Tolerance: Patient tolerated treatment well Patient left: in chair;with call bell/phone within reach;with family/visitor present Nurse Communication: Mobility status  GP     Mary [name] 07/11/2013, 10:00 AM

## 2013-07-12 MED ORDER — CYCLOBENZAPRINE HCL 10 MG PO TABS: 10.0000 mg | ORAL_TABLET | Freq: Three times a day (TID) | ORAL | Status: DC | PRN

## 2013-07-12 MED ORDER — OXYCODONE-ACETAMINOPHEN 5-325 MG PO TABS: 1.0000 | ORAL_TABLET | Freq: Four times a day (QID) | ORAL | Status: DC | PRN

## 2013-07-12 NOTE — Discharge Summary (Signed)
Physician Discharge Summary  Patient ID: Mary [name] MRN: [id number] DOB/AGE: 12/02/1960 53 y.o.  Admit date: 07/10/2013 Discharge date: 07/12/2013  Admission Diagnoses: spondylolisthesis L4-5   Discharge Diagnoses: same   Discharged Condition: good  Hospital Course: The patient was admitted on 07/10/2013 and taken to the operating room where the patient underwent PLIF L4-5. The patient tolerated the procedure well and was taken to the recovery room and then to the floor in stable condition. The hospital course was routine. There were no complications. The wound remained clean dry and intact. Pt had appropriate back soreness. No complaints of leg pain or new N/T/W. The patient remained afebrile with stable vital signs, and tolerated a regular diet. The patient continued to increase activities, and pain was well controlled with oral pain medications.   Consults: None  Significant Diagnostic Studies:  Results for orders placed during the hospital encounter of 07/01/13  SURGICAL PCR SCREEN      Result Value Range   MRSA, PCR NEGATIVE  NEGATIVE   Staphylococcus aureus NEGATIVE  NEGATIVE  BASIC METABOLIC PANEL      Result Value Range   Sodium 136  135 - 145 mEq/L   Potassium 3.7  3.5 - 5.1 mEq/L   Chloride 101  96 - 112 mEq/L   CO2 26  19 - 32 mEq/L   Glucose, Bld 100 (*) 70 - 99 mg/dL   BUN 13  6 - 23 mg/dL   Creatinine, Ser 0.67  0.50 - 1.10 mg/dL   Calcium 9.0  8.4 - 10.5 mg/dL   GFR calc non Af Amer >90  >90 mL/min   GFR calc Af Amer >90  >90 mL/min  CBC WITH DIFFERENTIAL      Result Value Range   WBC 9.1  4.0 - 10.5 K/uL   RBC 4.84  3.87 - 5.11 MIL/uL   Hemoglobin 12.4  12.0 - 15.0 g/dL   HCT 39.4  36.0 - 46.0 %   MCV 81.4  78.0 - 100.0 fL   MCH 25.6 (*) 26.0 - 34.0 pg   MCHC 31.5  30.0 - 36.0 g/dL   RDW 16.8 (*) 11.5 - 15.5 %   Platelets 270  150 - 400 K/uL   Neutrophils Relative % 62  43 - 77 %   Neutro Abs 5.7  1.7 - 7.7 K/uL   Lymphocytes Relative 28  12 - 46 %    Lymphs Abs 2.6  0.7 - 4.0 K/uL   Monocytes Relative 8  3 - 12 %   Monocytes Absolute 0.8  0.1 - 1.0 K/uL   Eosinophils Relative 1  0 - 5 %   Eosinophils Absolute 0.1  0.0 - 0.7 K/uL   Basophils Relative 0  0 - 1 %   Basophils Absolute 0.0  0.0 - 0.1 K/uL  PROTIME-INR      Result Value Range   Prothrombin Time 12.7  11.6 - 15.2 seconds   INR 0.97  0.00 - 1.49  TYPE AND SCREEN      Result Value Range   ABO/RH(D) B POS     Antibody Screen NEG     Sample Expiration 07/15/2013    ABO/RH      Result Value Range   ABO/RH(D) B POS      Chest 2 View  07/01/2013   CLINICAL DATA:  Spondylolisthesis. Palpitations. History shortness of breath.  EXAM: CHEST  2 VIEW  COMPARISON:  None.  FINDINGS: Mediastinum and hilar structures are normal. The lungs are  clear. No evidence of pleural effusion or pneumothorax. Heart size and pulmonary vascularity normal. Surgical clips upper abdomen. No acute osseous abnormality.  IMPRESSION: No active cardiopulmonary disease.   Electronically Signed   By: Marcello Moores  Register   On: 07/01/2013 16:36   Dg Lumbar Spine 2-3 Views  07/10/2013   CLINICAL DATA:  L4-5 posterior fusion.  EXAM: LUMBAR SPINE - 2-3 VIEW; DG C-ARM 1-60 MIN  COMPARISON:  CT 06/14/2013  FINDINGS: 2 intraoperative spot images demonstrate changes of posterior fusion at L4-5. No hardware or bony complicating feature. Normal alignment.  IMPRESSION: Posterior fusion L4-5.   Electronically Signed   By: Rolm Baptise M.D.   On: 07/10/2013 14:34   Ct Lumbar Spine Wo Contrast  06/14/2013   CLINICAL DATA:  Low back and bilateral buttock pain. Preop for lumbar surgery.  EXAM: CT LUMBAR SPINE WITHOUT CONTRAST  TECHNIQUE: Multidetector CT imaging of the lumbar spine was performed without intravenous contrast administration. Multiplanar CT image reconstructions were also generated.  COMPARISON:  Lumbar spine MRI 03/24/2010.  FINDINGS: Stable alignment of the lumbar vertebral bodies with degenerative anterolisthesis  of L4 due to advanced facet disease. No pars defects. Advanced facet disease at the other levels are also noted. The vertebral bodies are maintained in the disc spaces are fairly well maintained. Moderate degenerative disc disease noted at T12-L1 along with a rim calcified disc protrusion.  L1-2:  No significant findings.  Very generous spinal canal.  L2-3:  No significant findings.  Very generous spinal canal.  L3-4: Minimal diffuse annular bulge but the spinal canal is fairly generous and there is no significant spinal or foraminal stenosis.  L4-5: Broad-based bulging uncovered disc with mild flattening of the ventral thecal sac. There is severe facet disease but no significant spinal or foraminal stenosis.  L5-S1: Mild degenerative disc disease and advanced facet disease but no focal disc protrusions, spinal or foraminal stenosis.  IMPRESSION: 1. Degenerative anterolisthesis of L4 with a broad-based bulging uncovered disc and mild flattening of the ventral thecal sac but no significant spinal, lateral recess or foraminal stenosis. Severe facet disease. 2. Generous spinal canal without spinal or foraminal stenosis at L1-2, L2-3, L3-4 or L5-S1. 3. Stable degenerative disc disease at T12-L1 with a rim calcified disc protrusion and osteophytic ridging.   Electronically Signed   By: Kalman Jewels M.D.   On: 06/14/2013 16:13   Dg C-arm 1-60 Min  07/10/2013   CLINICAL DATA:  L4-5 posterior fusion.  EXAM: LUMBAR SPINE - 2-3 VIEW; DG C-ARM 1-60 MIN  COMPARISON:  CT 06/14/2013  FINDINGS: 2 intraoperative spot images demonstrate changes of posterior fusion at L4-5. No hardware or bony complicating feature. Normal alignment.  IMPRESSION: Posterior fusion L4-5.   Electronically Signed   By: Rolm Baptise M.D.   On: 07/10/2013 14:34    Antibiotics:  Anti-infectives   Start     Dose/Rate Route Frequency Ordered Stop   07/10/13 1345  ceFAZolin (ANCEF) IVPB 1 g/50 mL premix     1 g 100 mL/hr over 30 Minutes  Intravenous Every 8 hours 07/10/13 1336 07/10/13 2342   07/10/13 0854  bacitracin 50,000 Units in sodium chloride irrigation 0.9 % 500 mL irrigation  Status:  Discontinued       As needed 07/10/13 0854 07/10/13 1109   07/10/13 0600  ceFAZolin (ANCEF) IVPB 2 g/50 mL premix     2 g 100 mL/hr over 30 Minutes Intravenous On call to O.R. 07/09/13 1414 07/10/13 0840  Discharge Exam: Blood pressure 140/73, pulse 60, temperature 97.9 F (36.6 C), temperature source Oral, resp. rate 18, height 5\' 6"  (1.676 m), weight 115 kg (253 lb 8.5 oz), SpO2 100.00%. Neurologic: Grossly normal Incision CDI  Discharge Medications:     Medication List    STOP taking these medications       meloxicam 7.5 MG tablet  Commonly known as:  MOBIC      TAKE these medications       cyclobenzaprine 10 MG tablet  Commonly known as:  FLEXERIL  Take 1 tablet (10 mg total) by mouth 3 (three) times daily as needed for muscle spasms.     levothyroxine 100 MCG tablet  Commonly known as:  SYNTHROID, LEVOTHROID  Take 100 mcg by mouth daily before breakfast.     oxyCODONE-acetaminophen 5-325 MG per tablet  Commonly known as:  PERCOCET/ROXICET  Take 1-2 tablets by mouth every 6 (six) hours as needed for moderate pain or severe pain.     traMADol 50 MG tablet  Commonly known as:  ULTRAM  Take by mouth every 6 (six) hours as needed.     traMADol 50 MG tablet  Commonly known as:  ULTRAM  Take 50 mg by mouth 3 (three) times daily as needed for moderate pain.     traZODone 50 MG tablet  Commonly known as:  DESYREL  Take 50 mg by mouth at bedtime as needed for sleep.     Vitamin D (Ergocalciferol) 50000 UNITS Caps capsule  Commonly known as:  DRISDOL  Take 50,000 Units by mouth every Monday.     zolpidem 10 MG tablet  Commonly known as:  AMBIEN  Take 10 mg by mouth at bedtime.        Disposition: home   Final Dx: PLIF L4-5      Discharge Orders   Future Orders Complete By Expires   Call MD  for:  difficulty breathing, headache or visual disturbances  As directed    Call MD for:  persistant nausea and vomiting  As directed    Call MD for:  redness, tenderness, or signs of infection (pain, swelling, redness, odor or green/yellow discharge around incision site)  As directed    Call MD for:  severe uncontrolled pain  As directed    Call MD for:  temperature >100.4  As directed    Diet - low sodium heart healthy  As directed    Discharge instructions  As directed    Comments:     No prolonged sitting, no bending or twisting, no driving, may shower   Increase activity slowly  As directed       Follow-up Information   Follow up with Pearlina Friedly S, MD In 2 weeks.   Specialty:  Neurosurgery   Contact information:   [address] [place] [address] [phone]        Signed: Eustace Moore 07/12/2013, 10:01 AM

## 2013-07-12 NOTE — Progress Notes (Signed)
Occupational Therapy Treatment Patient Details Name: Mary [name] MRN: [id number] DOB: Jan 23, 1961 Today's Date: 07/12/2013 Time: 9371-6967 OT Time Calculation (min): 36 min  OT Assessment / Plan / Recommendation  History of present illness 53 y.o. s/p FOR MAXIMUM ACCESS (MAS) POSTERIOR LUMBAR INTERBODY FUSION (PLIF) LUMBAR FOUR-FIVE   OT comments  Pt progressing toward goals.  Able to perform toileting tasks and functional mobility at supervision level. She is hopeful to return home today.  Follow Up Recommendations  No OT follow up;Supervision/Assistance - 24 hour    Barriers to Discharge       Equipment Recommendations  3 in 1 bedside comode;Other (comment) (AE kit)    Recommendations for Other Services    Frequency Min 2X/week   Progress towards OT Goals Progress towards OT goals: Progressing toward goals  Plan Discharge plan remains appropriate    Precautions / Restrictions Precautions Precautions: Back;Fall Precaution Comments: Pt able to recall 3/3 back precautions. Required Braces or Orthoses: Spinal Brace Spinal Brace: Lumbar corset;Applied in sitting position Restrictions Weight Bearing Restrictions: No   Pertinent Vitals/Pain See vitals    ADL  Grooming: Performed;Wash/dry hands;Supervision/safety Where Assessed - Grooming: Unsupported standing Upper Body Dressing: Performed;Supervision/safety Where Assessed - Upper Body Dressing: Unsupported sitting Toilet Transfer: Performed;Supervision/safety Toilet Transfer Method: Sit to Loss adjuster, chartered: Comfort height toilet Toileting - Clothing Manipulation and Hygiene: Performed;Supervision/safety Where Assessed - Best boy and Hygiene: Sit to stand from 3-in-1 or toilet Tub/Shower Transfer: Performed;Supervision/safety Tub/Shower Transfer Method: Therapist, art: IT consultant Used: Back brace;Gait belt;Rolling walker Transfers/Ambulation  Related to ADLs: Supervision with RW ADL Comments: Pt verbalized understanding of AE, including toilet aid.  She reports her husband is going to hospital gift shop today to purchase AE.  Pt also stated that she has a tub bench at home that she borrowed from a friend.  Demo'd tub transfer with bench at supervision level.    OT Diagnosis:    OT Problem List:   OT Treatment Interventions:     OT Goals(current goals can now be found in the care plan section) Acute Rehab OT Goals Patient Stated Goal: Go home soon as possible OT Goal Formulation: With patient Time For Goal Achievement: 07/17/13 Potential to Achieve Goals: Good ADL Goals Pt Will Perform Grooming: with modified independence;standing Pt Will Perform Lower Body Bathing: with modified independence;sit to/from stand;with adaptive equipment Pt Will Perform Lower Body Dressing: with modified independence;with adaptive equipment;sit to/from stand Pt Will Transfer to Toilet: with modified independence;ambulating Pt Will Perform Toileting - Clothing Manipulation and hygiene: with modified independence;sit to/from stand Pt Will Perform Tub/Shower Transfer: Tub transfer;with supervision;ambulating;rolling walker Additional ADL Goal #1: Pt will don/doff brace independently while maintaining back precautions.  Additional ADL Goal #2: Pt will independently verbalize and demonstrate 3/3 back precautions.   Visit Information  Last OT Received On: 07/12/13 Assistance Needed: +1 History of Present Illness: 53 y.o. s/p FOR MAXIMUM ACCESS (MAS) POSTERIOR LUMBAR INTERBODY FUSION (PLIF) LUMBAR FOUR-FIVE    Subjective Data      Prior Functioning       Cognition  Cognition Arousal/Alertness: Awake/alert Behavior During Therapy: WFL for tasks assessed/performed Overall Cognitive Status: Within Functional Limits for tasks assessed    Mobility  Bed Mobility Bed Mobility: Sit to Sidelying Left Sit to Sidelying Left: 5: Supervision Details  for Bed Mobility Assistance: Incr time but no physical assist needed. Transfers Transfers: Sit to Stand;Stand to Sit Sit to Stand: 5: Supervision;From chair/3-in-1;From toilet;With upper extremity assist Stand  to Sit: 5: Supervision;To bed;To toilet;With upper extremity assist    Exercises      Balance     End of Session OT - End of Session Equipment Utilized During Treatment: Rolling walker;Back brace Activity Tolerance: Patient tolerated treatment well Patient left: in bed;with call bell/phone within reach;with family/visitor present  GO   07/12/2013 Darrol Jump OTR/L Pager [phone] Office [phone]   Darrol Jump 07/12/2013, 9:47 AM

## 2013-07-12 NOTE — Progress Notes (Signed)
Physical Therapy Note   07/12/13 0800  PT Visit Information  Last PT Received On 07/12/13  Assistance Needed +1  History of Present Illness 53 y.o. s/p FOR MAXIMUM ACCESS (MAS) POSTERIOR LUMBAR INTERBODY FUSION (PLIF) LUMBAR FOUR-FIVE  PT Time Calculation  PT Start Time 0810  PT Stop Time 0837  PT Time Calculation (min) 27 min  Subjective Data  Patient Stated Goal Go home soon as possible  Precautions  Precautions Back  Precaution Comments pt verbalized 3/3 back precautions.    Required Braces or Orthoses Spinal Brace  Spinal Brace Lumbar corset;Applied in sitting position  Restrictions  Weight Bearing Restrictions No  Cognition  Arousal/Alertness Awake/alert  Behavior During Therapy WFL for tasks assessed/performed  Overall Cognitive Status Within Functional Limits for tasks assessed  Bed Mobility  Bed Mobility Rolling Right;Right Sidelying to Sit;Sitting - Scoot to Edge of Bed  Rolling Right 5: Supervision  Right Sidelying to Sit 5: Supervision;With rails  Sitting - Scoot to Edge of Bed 5: Supervision  Details for Bed Mobility Assistance Demos good technique with increased time.    Transfers  Transfers Sit to Stand;Stand to Sit  Sit to Stand 5: Supervision;With upper extremity assist;From bed;From toilet  Stand to Sit 5: Supervision;With upper extremity assist;To toilet;To chair/3-in-1  Details for Transfer Assistance Demos good use of UEs and use of grab bar in bathroom.    Ambulation/Gait  Ambulation/Gait Assistance 5: Supervision  Ambulation Distance (Feet) 400 Feet  Assistive device Rolling walker  Ambulation/Gait Assistance Details pt demos good use of RW and safety with mobility.    Gait Pattern Step-through pattern;Decreased stride length  Stairs Yes  Stairs Assistance 4: Min guard  Stair Management Technique Two rails;Step to pattern;Forwards  Number of Stairs 5  Doctor, hospital No  Balance  Balance Assessed No  PT - End of Session   Equipment Utilized During Treatment Gait belt;Back brace  Activity Tolerance Patient tolerated treatment well  Patient left in chair;with call bell/phone within reach  Nurse Communication Mobility status  PT - Assessment/Plan  PT Plan Frequency needs to be updated  PT Frequency Min 5X/week  Follow Up Recommendations Home health PT;Supervision/Assistance - 24 hour  PT equipment Rolling walker with 5" wheels;3in1 (PT)  PT Goal Progression  Progress towards PT goals Progressing toward goals  Acute Rehab PT Goals  Time For Goal Achievement 07/18/13  Potential to Achieve Goals Good  PT General Charges  $$ ACUTE PT VISIT 1 Procedure  PT Treatments  $Gait Training 23-37 mins   [place], [place] [phone]

## 2013-07-12 NOTE — Care Management Note (Signed)
    Page 1 of 1   07/12/2013     10:51:46 AM   CARE MANAGEMENT NOTE 07/12/2013  Patient:  Buddenhagen,Cherree   Account Number:  [id number]  Date Initiated:  07/11/2013  Documentation initiated by:  Ricki Miller  Subjective/Objective Assessment:   53 yr old female s/p L4-5 Lumbar laminectomy.     Action/Plan:   DME ordered, will need orders for [facility] from MD   Anticipated DC Date:     Anticipated DC Plan:  [place]  CM consult      Choice offered to / List presented to:     DME arranged  [place]  3-N-1        [place] arranged  [place].   Status of service:  Completed, signed off Medicare Important Message given?   (If response is "NO", the following Medicare IM given date fields will be blank) Date Medicare IM given:   Date Additional Medicare IM given:    Discharge Disposition:  [place]  Per UR Regulation:  Reviewed for med. necessity/level of care/duration of stay  If discussed at [place] of Stay Meetings, dates discussed:    Comments:  07/12/13 [place], MSN, CM- Met with patient to discuss home health PT/OT. Patient is agreeable and has chosen [facility].  Debbie with [facility] was notified and has accepted the referral.  Per patient, DME was delivered yesterday.  Patient's RN was updated.

## 2013-07-12 NOTE — Progress Notes (Signed)
Discharge orders received, pt for discharge home today with home health PT .  IV D/C with dressing CDI to lower back.  D/C instructions and Rx given with verbalized understanding.  Family at bedside to assist pt with discharge. Staff brought pt downstairs via wheelchair.

## 2013-07-15 ENCOUNTER — Encounter (HOSPITAL_COMMUNITY): Payer: Self-pay | Admitting: Neurological Surgery

## 2013-11-09 ENCOUNTER — Emergency Department: Payer: Self-pay | Admitting: Emergency Medicine

## 2013-11-09 LAB — BASIC METABOLIC PANEL
Anion Gap: 6 — ABNORMAL LOW (ref 7–16)
BUN: 16 mg/dL (ref 7–18)
Calcium, Total: 8.8 mg/dL (ref 8.5–10.1)
Chloride: 106 mmol/L (ref 98–107)
Co2: 26 mmol/L (ref 21–32)
Creatinine: 0.65 mg/dL (ref 0.60–1.30)
EGFR (African American): >60
EGFR (Non-African Amer.): >60
Glucose: 100 mg/dL — ABNORMAL HIGH (ref 65–99)
Osmolality: 277 (ref 275–301)
Potassium: 3.9 mmol/L (ref 3.5–5.1)
Sodium: 138 mmol/L (ref 136–145)

## 2013-11-09 LAB — CBC WITH DIFFERENTIAL/PLATELET
Basophil #: 0.1 10*3/uL (ref 0.0–0.1)
Basophil %: 1 %
Eosinophil #: 0.1 10*3/uL (ref 0.0–0.7)
Eosinophil %: 0.6 %
HCT: 37.8 % (ref 35.0–47.0)
HGB: 12.2 g/dL (ref 12.0–16.0)
Lymphocyte #: 2.5 10*3/uL (ref 1.0–3.6)
Lymphocyte %: 26.2 %
MCH: 24.8 pg — ABNORMAL LOW (ref 26.0–34.0)
MCHC: 32.2 g/dL (ref 32.0–36.0)
MCV: 77 fL — ABNORMAL LOW (ref 80–100)
Monocyte #: 1.1 x10 3/mm — ABNORMAL HIGH (ref 0.2–0.9)
Monocyte %: 10.8 %
Neutrophil #: 6 10*3/uL (ref 1.4–6.5)
Neutrophil %: 61.4 %
Platelet: 215 10*3/uL (ref 150–440)
RBC: 4.9 10*6/uL (ref 3.80–5.20)
RDW: 17.7 % — ABNORMAL HIGH (ref 11.5–14.5)
WBC: 9.7 10*3/uL (ref 3.6–11.0)

## 2014-01-06 ENCOUNTER — Ambulatory Visit: Payer: Self-pay | Admitting: Internal Medicine

## 2014-01-30 ENCOUNTER — Ambulatory Visit: Payer: Self-pay | Admitting: Neurosurgery

## 2014-02-04 ENCOUNTER — Ambulatory Visit: Payer: Self-pay | Admitting: Unknown Physician Specialty

## 2014-02-19 ENCOUNTER — Ambulatory Visit: Payer: Self-pay | Admitting: General Practice

## 2014-03-05 ENCOUNTER — Ambulatory Visit (INDEPENDENT_AMBULATORY_CARE_PROVIDER_SITE_OTHER): Payer: BC Managed Care – PPO | Admitting: Gynecology

## 2014-03-05 ENCOUNTER — Encounter: Payer: Self-pay | Admitting: Gynecology

## 2014-03-05 VITALS — BP 130/80 | Ht 65.0 in | Wt 253.0 lb

## 2014-03-05 DIAGNOSIS — N83209 Unspecified ovarian cyst, unspecified side: Secondary | ICD-10-CM

## 2014-03-05 NOTE — Progress Notes (Signed)
Mary [name] Apr 30, 1961 [id number]        53 y.o.  [id number] new patient referred in consultation due to discovery of a new pelvic mass. Patient with a history of TVH and  LSO for bleeding and ovarian cyst in the 2001.  Doing well from a gynecologic standpoint receiving her routine health care through her primary physician. Does relate having blood work confirming menopause several years ago. Had CT scan for her lumbar spine where a" fluid density lesion with a lobular right upper margin in the central pelvis" was noted.  A followup abdominal/vaginal ultrasound was performed which showed a" midline cystic mass 8.7 x 6.8 x 3.5 cm. Thin septations noted but no mention in the report of vascularity although review of the CD that she brought with her they appear to have some flow at the base of the septation.  Patient relates having no pain or symptoms from this.  Past medical history,surgical history, problem list, medications, allergies, family history and social history were all reviewed and documented in the EPIC chart.  Directed ROS with pertinent positives and negatives documented in the history of present illness/assessment and plan.  Exam: Mary [name] General appearance:  Normal Abdomen soft nontender without masses guarding rebound organomegaly. Pelvic external BUS vagina normal. Bimanual with hint of a mass at the upper vaginal cuff but difficult to outline on bimanual secondary to abdominal girth. Rectal exam normal.  Assessment/Plan:  53 y.o. 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 Will check baseline CA 125. Reviewed differential with the patient and her husband to include benign cyst versus early malignancy such as borderline LMP tumor or frank ovarian cancer. Given the size and postmenopausal status of the patient I think the prudent course would be to refer to gynecologic oncologist for surgery and removal to allow for the possibility of staging if early carcinoma  encountered. I did review the possibility for expectant management as she is asymptomatic but the patient is not interested in this and would definitely want this removed regardless. I will followup on the CA 125 results but regardless we'll refer to the gynecologic oncologist and the patient agrees with this.   Note: This document was prepared with digital dictation and possible smart phrase technology. Any transcriptional errors that result from this process are unintentional.   Anastasio Auerbach MD, 4:57 PM 03/05/2014

## 2014-03-06 ENCOUNTER — Telehealth: Payer: Self-pay | Admitting: *Deleted

## 2014-03-06 LAB — CA 125: CA 125: 35 U/mL — ABNORMAL HIGH (ref <35)

## 2014-03-06 NOTE — Telephone Encounter (Signed)
Message copied by Thamas Jaegers on Thu Mar 06, 2014  8:58 AM ------      Message from: Anastasio Auerbach      Created: Wed Mar 05, 2014  4:57 PM       Patient needs referral to [facility]/GYN oncologist reference 8-9 cm cystic septated right ovarian pelvic mass, postmenopausal woman. Status post hysterectomy LSO in the past.  CA 125 pending ------

## 2014-03-06 NOTE — Telephone Encounter (Signed)
Appointment 03/10/14 @ 11:45 am check in with Dr.Emma Denman George pt informed with the below note.

## 2014-03-07 ENCOUNTER — Telehealth: Payer: Self-pay | Admitting: *Deleted

## 2014-03-07 NOTE — Telephone Encounter (Signed)
Pt calling requesting CA-125 results. Please advise

## 2014-03-10 ENCOUNTER — Encounter: Payer: Self-pay | Admitting: Gynecologic Oncology

## 2014-03-10 ENCOUNTER — Ambulatory Visit: Payer: BC Managed Care – PPO | Attending: Gynecologic Oncology | Admitting: Gynecologic Oncology

## 2014-03-10 VITALS — BP 141/76 | HR 60 | Temp 98.3°F | Resp 20 | Ht 65.0 in | Wt 254.3 lb

## 2014-03-10 DIAGNOSIS — D649 Anemia, unspecified: Secondary | ICD-10-CM | POA: Insufficient documentation

## 2014-03-10 DIAGNOSIS — N83201 Unspecified ovarian cyst, right side: Secondary | ICD-10-CM | POA: Insufficient documentation

## 2014-03-10 DIAGNOSIS — R1900 Intra-abdominal and pelvic swelling, mass and lump, unspecified site: Secondary | ICD-10-CM | POA: Insufficient documentation

## 2014-03-10 DIAGNOSIS — E039 Hypothyroidism, unspecified: Secondary | ICD-10-CM | POA: Insufficient documentation

## 2014-03-10 DIAGNOSIS — E785 Hyperlipidemia, unspecified: Secondary | ICD-10-CM | POA: Diagnosis not present

## 2014-03-10 DIAGNOSIS — I739 Peripheral vascular disease, unspecified: Secondary | ICD-10-CM | POA: Diagnosis not present

## 2014-03-10 DIAGNOSIS — Z87891 Personal history of nicotine dependence: Secondary | ICD-10-CM | POA: Insufficient documentation

## 2014-03-10 DIAGNOSIS — E739 Lactose intolerance, unspecified: Secondary | ICD-10-CM | POA: Diagnosis not present

## 2014-03-10 DIAGNOSIS — Z79899 Other long term (current) drug therapy: Secondary | ICD-10-CM | POA: Diagnosis not present

## 2014-03-10 DIAGNOSIS — N83209 Unspecified ovarian cyst, unspecified side: Secondary | ICD-10-CM

## 2014-03-10 NOTE — Patient Instructions (Signed)
Plan for surgery on October 8 in the morning at [facility] with Dr. Denman George.                Preparing for your Surgery  Pre-operative Testing -You will receive a phone call from presurgical testing at [facility] to arrange for a pre-operative testing appointment before your surgery.  This appointment normally occurs one to two weeks before your scheduled surgery.   -Bring your insurance card, copy of an advanced directive if applicable, medication list  -At that visit, you will be asked to sign a consent for a possible blood transfusion in case a transfusion becomes necessary during surgery.  The need for a blood transfusion is rare but having consent is a necessary part of your care.     Day Before Surgery at [place] will be asked to take in only clear liquids the day before surgery.  Examples of clear liquids include broths, jello, and clear juices.  You will be advised to have nothing to eat or drink after midnight the evening before.    Your role in recovery Your role is to become active as soon as directed by your doctor, while still giving yourself time to heal.  Rest when you feel tired. You will be asked to do the following in order to speed your recovery:  - Cough and breathe deeply. This helps toclear and expand your lungs and can prevent pneumonia. You may be given a spirometer to practice deep breathing. A staff member will show you how to use the spirometer. - Do mild physical activity. Walking or moving your legs help your circulation and body functions return to normal. A staff member will help you when you try to walk and will provide you with simple exercises. Do not try to get up or walk alone the first time. - Actively manage your pain. Managing your pain lets you move in comfort. We will ask you to rate your pain on a scale of zero to 10. It is your responsibility to tell your doctor or nurse where and how much you hurt so your pain can be  treated.  Special Considerations -If you are diabetic, you may be placed on insulin after surgery to have closer control over your blood sugars to promote healing and recovery.  This does not mean that you will be discharged on insulin.  If applicable, your oral antidiabetics will be resumed when you are tolerating a solid diet.  -Your final pathology results from surgery should be available by the Friday after surgery and the results will be relayed to you when available.

## 2014-03-10 NOTE — Progress Notes (Signed)
Consult Note: Gyn-Onc  Consult was requested by Dr. Phineas Real for the evaluation of Mary [name] 53 y.o. female with an 8x9cm cystic pelvic mass  CC: No chief complaint on file.   Assessment/Plan:  Ms. Mary [name]  is a 53 y.o.  year old woman with an asymptomatic 8 x 9 cm mostly cystic (with thin septation) central pelvic mass, likely associated with her right ovary.  At a low suspicion overall that this mass represents malignancy, however her given her borderline CA 125 (35 international units per milliliter) and the septation within given that she is postmenopausal removal of this cyst is reasonable. I discussed alternatives to surgery including close followup with serial imaging. I discussed that following transiently on 25 can also aid in ascertaining the likelihood of malignancy. I discussed that the only definitive way to determine if it was malignant benign would be for surgical excision. Overall she is a good surgical candidate. I did discuss with the patient that she has increased surgical risks compared to an average patient due to her her significant obesity (weight 254 pounds), and history of multiple prior surgeries which substantially increases her risk for perioperative complication including damage to internal organs necessitating laparotomy or reoperation. I discussed that the benefit to surgical removal would be definitive pathologic ascertainment, and if right salpingo-oophorectomy is performed at the time of surgery she would avoid a one in 10 risk for subsequent surgery for an ovarian cyst. She is an average risk for ovarian cancer with no first or second degree relatives with a history of ovarian or breast cancer.  The patient is determined to proceed with surgical exploration and right salpingo-oophorectomy. We'll attempt this laparoscopically. We will perform surgical staging if ovarian malignancy is identified. If this is determined to be perineal inclusion cyst, or  hydrosalpinx, the patient would elect for a right salpingo-oophorectomy to reduce the likelihood of requiring additional surgery in the future on the right ovary. I counseled her regarding the increase risks for early death in women who undergo BSO for benign indications younger than the age of 28. She understands this but would prefer to have definitive removal of ovaries at the time of the surgery regardless of whether or not the cystic mass originates from the ovary. I offered her an earlier surgical date at [facility] in [place] versus surgery here at in [place] in October 2015 (which are believe is a safe timeframe given a the low clinical suspicion for malignancy and a half based on her imaging and CA 125 evaluation). The patient is electing for a surgery performed here in [place] in October.   HPI: She now is a 53 year old G1P1 who is seen in consultation at the request of Dr. Phineas Real for an asymptomatic 9 cm central pelvic mostly simple cystic mass abutting the bladder. She has a history of a vaginal hysterectomy and left salpingo-oophorectomy in 2001 for a left ovarian cyst and benign disease in the uterus. She also has history of multiple other abdominal surgeries including an open appendectomy, cesarean section, and gastric bypass surgery. She is obese.  This mass was found incidentally on the CT scan of the lumbar spine performed in July 2015 [facility] for low back pain. It was followed up by a transvaginal ultrasound which identified a midline cystic mass measuring 8.7 x 6.8 x 3.5 cm with a thin internal septations versus fold. The right ovary was seen to measure 5.7 x 4.4 x 8.3 cm it is unclear if  this mass originated from the right ovary was separate to it. A hydrosalpinx was considered to be less likely on the differential.   on 03/05/2014 CA 125 was borderline elevated at 35 units per milliliter.  She denies urinary frequency, though does report occasionally having  to to her position to completely avoid her bladder. She denies a change in bowel habit, or narrowing of stools. She denies pelvic or abdominal pain. She denies dyspareunia.   Interval History: She continues to be asymptomatic from this mass.  Current Meds:  Outpatient Encounter Prescriptions as of 03/10/2014  Medication Sig  . cyclobenzaprine (FLEXERIL) 10 MG tablet Take 1 tablet (10 mg total) by mouth 3 (three) times daily as needed for muscle spasms.  Marland Kitchen levothyroxine (SYNTHROID, LEVOTHROID) 100 MCG tablet Take 100 mcg by mouth daily before breakfast.  . oxyCODONE-acetaminophen (PERCOCET/ROXICET) 5-325 MG per tablet Take 1-2 tablets by mouth every 6 (six) hours as needed for moderate pain or severe pain.  . traMADol (ULTRAM) 50 MG tablet Take by mouth every 6 (six) hours as needed.  . traZODone (DESYREL) 50 MG tablet Take 50 mg by mouth at bedtime as needed for sleep.  . Vitamin D, Ergocalciferol, (DRISDOL) 50000 UNITS CAPS capsule Take 50,000 Units by mouth every Monday.  . zolpidem (AMBIEN) 10 MG tablet Take 10 mg by mouth at bedtime.    Allergy: No Known Allergies  Social Hx:   History   Social History  . Marital Status: Married    Spouse Name: N/A    Number of Children: 2  . Years of Education: N/A   Occupational History  . [employer]  . Smoking status: Former Smoker -- 0.30 packs/day for 4 years    Quit date: 07/11/2002  . Smokeless tobacco: Never Used  . Alcohol Use: 0.5 oz/week    1 drink(s) per week     Comment: occasional beer and wine  . Drug Use: No  . Sexual Activity: Yes    Birth Control/ Protection: Surgical     Comment: HYST   Other Topics Concern  . Not on file   Social History Narrative  . No narrative on file    Past Surgical Hx:  Past Surgical History  Procedure Laterality Date  . 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 Plantar fascia surgery  1999  . Cesarean section  1987    twins  .  Appendectomy  1985  . Colonoscopy    . Laminectomy  07/10/2013    L4   L5   DR Ronnald Ramp  . Maximum access (mas)posterior lumbar interbody fusion (plif) 1 level  07/10/2013    Procedure: FOR MAXIMUM ACCESS (MAS) POSTERIOR LUMBAR INTERBODY FUSION (PLIF) LUMBAR FOUR-FIVE;  Surgeon: Eustace Moore, MD;  Location: [facility];  Service: Neurosurgery;;  FOR MAXIMUM ACCESS (MAS) POSTERIOR LUMBAR INTERBODY FUSION (PLIF) LUMBAR FOUR-FIVE  . Oophorectomy      LSO  . Vaginal hysterectomy  2001    for bleeding and LSO for cyst    Past Medical Hx:  Past Medical History  Diagnosis Date  . Palpitations   . Hyperlipidemia   . PAC (premature atrial contraction)   . Hypothyroidism   . Pneumonia     "walking pneumonia"  . Shortness of breath     going upstairs (due to weight)  . Sleep apnea     after weight loss does not have to use cpap  . Peripheral vascular disease     "  small" clot after thermal ablasion  . Arthritis   . Anemia     low iron at times  . Lactose intolerance   . Complication of anesthesia     Pt reports she awoke during plantar fascia surgery and felt "everything"  . Panic attacks     Pt reports restrictive clothing/areas and dry mouth cause her to have attack.    Past Gynecological History:  G1P1 (cesarean section)  No LMP recorded. Patient has had a hysterectomy.  Family Hx:  Family History  Problem Relation Age of Onset  . Heart disease Mother   . Lung cancer Mother   . Diabetes Mother     later in life  . Coronary artery disease Mother   . Heart disease Father   . Arrhythmia Sister     Review of Systems:  Constitutional  Feels well,    ENT Normal appearing ears and nares bilaterally Skin/Breast  No rash, sores, jaundice, itching, dryness Cardiovascular  No chest pain, shortness of breath, or edema  Pulmonary  No cough or wheeze.  Gastro Intestinal  No nausea, vomitting, or diarrhoea. No bright red blood per rectum, no abdominal pain, change in bowel  movement, or constipation.  Genito Urinary  No frequency, urgency, dysuria, see HPI Musculo Skeletal  No myalgia, arthralgia, joint swelling or pain  Neurologic  No weakness, numbness, change in gait,  Psychology  No depression, anxiety, insomnia.   Vitals:  Blood pressure 141/76, pulse 60, temperature 98.3 F (36.8 C), temperature source Oral, resp. rate 20, height 5\' 5"  (1.651 m), weight 254 lb 4.8 oz (115.35 kg).  Physical Exam: WD in NAD Neck  Supple NROM, without any enlargements.  Lymph Node Survey No cervical supraclavicular or inguinal adenopathy Cardiovascular  Pulse normal rate, regularity and rhythm. S1 and S2 normal.  Lungs  Clear to auscultation bilateraly, without wheezes/crackles/rhonchi. Good air movement.  Skin  No rash/lesions/breakdown  Psychiatry  Alert and oriented to person, place, and time  Abdomen  Normoactive bowel sounds, abdomen soft, non-tender and obese without evidence of hernia. Well healed laparoscopic and low transverse incisions from prior surgeries. Back No CVA tenderness Genito Urinary  Vulva/vagina: Normal external female genitalia.  No lesions. No discharge or bleeding.  Bladder/urethra:  No lesions or masses, well supported bladder  Vagina: normal with normal support  Cervix: surgically absent  Uterus: surgically absent   Adnexa: no palpable discrete masses though there is cystic fullness anteriorally. Exam limited by body habitus.. Rectal  Good tone, no masses no cul de sac nodularity.  Extremities  No bilateral cyanosis, clubbing or edema.   Donaciano Eva, MD   03/10/2014, 1:18 PM

## 2014-03-10 NOTE — Telephone Encounter (Signed)
Pt informed with the below note. 

## 2014-03-10 NOTE — Telephone Encounter (Signed)
Tell patient that her CA 125 returned at 36. Less than 35 is technically normal. I really would consider this a negative result. Ovarian tumors normally had high numbers like 100 200. But this is certainly another reason to proceed with surgery.

## 2014-03-19 ENCOUNTER — Encounter: Payer: Self-pay | Admitting: Neurosurgery

## 2014-03-19 ENCOUNTER — Encounter: Payer: Self-pay | Admitting: General Practice

## 2014-04-03 ENCOUNTER — Encounter (HOSPITAL_COMMUNITY): Payer: Self-pay | Admitting: Pharmacy Technician

## 2014-04-10 NOTE — Patient Instructions (Addendum)
Mary [name]  04/10/2014   Your procedure is scheduled on:  04/17/2014    Report to [facility].  Follow the Signs to [place] at  0530      am  Call this number if you have problems the morning of surgery: [phone]   Remember:   Do not eat food or drink liquids after midnight.   Take these medicines the morning of surgery with A SIP OF WATER: Synthroid    Do not wear jewelry, make-up or nail polish.  Do not wear lotions, powders, or perfumes. deodorant.  Do not shave 48 hours prior to surgery.  Do not bring valuables to the hospital.  Contacts, dentures or bridgework may not be worn into surgery.      Patients discharged the day of surgery will not be allowed to drive  home.  Name and phone number of your driver:       Please read over the following fact sheets that you were given:[facility] - Preparing for Surgery Before surgery, you can play an important role.  Because skin is not sterile, your skin needs to be as free of germs as possible.  You can reduce the number of germs on your skin by washing with CHG (chlorahexidine gluconate) soap before surgery.  CHG is an antiseptic cleaner which kills germs and bonds with the skin to continue killing germs even after washing. Please DO NOT use if you have an allergy to CHG or antibacterial soaps.  If your skin becomes reddened/irritated stop using the CHG and inform your nurse when you arrive at Short Stay. Do not shave (including legs and underarms) for at least 48 hours prior to the first CHG shower.  You may shave your face/neck. Please follow these instructions carefully:  1.  Shower with CHG Soap the night before surgery and the  morning of Surgery.  2.  If you choose to wash your hair, wash your hair first as usual with your  normal  shampoo.  3.  After you shampoo, rinse your hair and body thoroughly to remove the  shampoo.                           4.  Use CHG as you would any other liquid soap.  You can  apply chg directly  to the skin and wash                       Gently with a scrungie or clean washcloth.  5.  Apply the CHG Soap to your body ONLY FROM THE NECK DOWN.   Do not use on face/ open                           Wound or open sores. Avoid contact with eyes, ears mouth and genitals (private parts).                       Wash face,  Genitals (private parts) with your normal soap.             6.  Wash thoroughly, paying special attention to the area where your surgery  will be performed.  7.  Thoroughly rinse your body with warm water from the neck down.  8.  DO NOT shower/wash with your normal soap after using and rinsing off  the CHG Soap.  9.  Pat yourself dry with a clean towel.            10.  Wear clean pajamas.            11.  Place clean sheets on your bed the night of your first shower and do not  sleep with pets. Day of Surgery : Do not apply any lotions/deodorants the morning of surgery.  Please wear clean clothes to the hospital/surgery center.  FAILURE TO FOLLOW THESE INSTRUCTIONS MAY RESULT IN THE CANCELLATION OF YOUR SURGERY PATIENT SIGNATURE_________________________________  NURSE SIGNATURE__________________________________  ________________________________________________________________________  WHAT IS A BLOOD TRANSFUSION? Blood Transfusion Information  A transfusion is the replacement of blood or some of its parts. Blood is made up of multiple cells which provide different functions.  Red blood cells carry oxygen and are used for blood loss replacement.  White blood cells fight against infection.  Platelets control bleeding.  Plasma helps clot blood.  Other blood products are available for specialized needs, such as hemophilia or other clotting disorders. BEFORE THE TRANSFUSION  Who gives blood for transfusions?   Healthy volunteers who are fully evaluated to make sure their blood is safe. This is blood bank blood. Transfusion therapy is  the safest it has ever been in the practice of medicine. Before blood is taken from a donor, a complete history is taken to make sure that person has no history of diseases nor engages in risky social behavior (examples are intravenous drug use or sexual activity with multiple partners). The donor's travel history is screened to minimize risk of transmitting infections, such as malaria. The donated blood is tested for signs of infectious diseases, such as HIV and hepatitis. The blood is then tested to be sure it is compatible with you in order to minimize the chance of a transfusion reaction. If you or a relative donates blood, this is often done in anticipation of surgery and is not appropriate for emergency situations. It takes many days to process the donated blood. RISKS AND COMPLICATIONS Although transfusion therapy is very safe and saves many lives, the main dangers of transfusion include:   Getting an infectious disease.  Developing a transfusion reaction. This is an allergic reaction to something in the blood you were given. Every precaution is taken to prevent this. The decision to have a blood transfusion has been considered carefully by your caregiver before blood is given. Blood is not given unless the benefits outweigh the risks. AFTER THE TRANSFUSION  Right after receiving a blood transfusion, you will usually feel much better and more energetic. This is especially true if your red blood cells have gotten low (anemic). The transfusion raises the level of the red blood cells which carry oxygen, and this usually causes an energy increase.  The nurse administering the transfusion will monitor you carefully for complications. HOME CARE INSTRUCTIONS  No special instructions are needed after a transfusion. You may find your energy is better. Speak with your caregiver about any limitations on activity for underlying diseases you may have. SEEK MEDICAL CARE IF:   Your condition is not  improving after your transfusion.  You develop redness or irritation at the intravenous (IV) site. SEEK IMMEDIATE MEDICAL CARE IF:  Any of the following symptoms occur over the next 12 hours:  Shaking chills.  You have a temperature by mouth above 102 F (38.9 C), not controlled by medicine.  Chest, back, or muscle pain.  People around you feel you are not acting correctly or  are confused.  Shortness of breath or difficulty breathing.  Dizziness and fainting.  You get a rash or develop hives.  You have a decrease in urine output.  Your urine turns a dark color or changes to pink, red, or brown. Any of the following symptoms occur over the next 10 days:  You have a temperature by mouth above 102 F (38.9 C), not controlled by medicine.  Shortness of breath.  Weakness after normal activity.  The white part of the eye turns yellow (jaundice).  You have a decrease in the amount of urine or are urinating less often.  Your urine turns a dark color or changes to pink, red, or brown. Document Released: 06/24/2000 Document Revised: 09/19/2011 Document Reviewed: 02/11/2008 ExitCare Patient Information 2014 [place].  _______________________________________________________________________ coughing and deep breathing exercises, leg exercises

## 2014-04-11 ENCOUNTER — Encounter (HOSPITAL_COMMUNITY)
Admission: RE | Admit: 2014-04-11 | Discharge: 2014-04-11 | Disposition: A | Discharge status: Home / Self Care | Payer: BC Managed Care – PPO | Source: Ambulatory Visit | Attending: Gynecologic Oncology | Admitting: Gynecologic Oncology

## 2014-04-11 ENCOUNTER — Encounter (HOSPITAL_COMMUNITY): Payer: Self-pay

## 2014-04-11 ENCOUNTER — Encounter: Payer: Self-pay | Admitting: Gynecologic Oncology

## 2014-04-11 ENCOUNTER — Ambulatory Visit: Payer: BC Managed Care – PPO | Attending: Gynecologic Oncology | Admitting: Gynecologic Oncology

## 2014-04-11 VITALS — BP 129/70 | HR 74 | Temp 98.2°F | Resp 20 | Ht 65.0 in | Wt 255.7 lb

## 2014-04-11 DIAGNOSIS — Z01818 Encounter for other preprocedural examination: Secondary | ICD-10-CM | POA: Insufficient documentation

## 2014-04-11 DIAGNOSIS — Z90721 Acquired absence of ovaries, unilateral: Secondary | ICD-10-CM | POA: Diagnosis not present

## 2014-04-11 DIAGNOSIS — Z87891 Personal history of nicotine dependence: Secondary | ICD-10-CM | POA: Insufficient documentation

## 2014-04-11 DIAGNOSIS — E039 Hypothyroidism, unspecified: Secondary | ICD-10-CM | POA: Insufficient documentation

## 2014-04-11 DIAGNOSIS — Z780 Asymptomatic menopausal state: Secondary | ICD-10-CM | POA: Insufficient documentation

## 2014-04-11 DIAGNOSIS — Z8249 Family history of ischemic heart disease and other diseases of the circulatory system: Secondary | ICD-10-CM | POA: Insufficient documentation

## 2014-04-11 DIAGNOSIS — R1900 Intra-abdominal and pelvic swelling, mass and lump, unspecified site: Secondary | ICD-10-CM | POA: Diagnosis present

## 2014-04-11 DIAGNOSIS — Z801 Family history of malignant neoplasm of trachea, bronchus and lung: Secondary | ICD-10-CM | POA: Insufficient documentation

## 2014-04-11 DIAGNOSIS — N83201 Unspecified ovarian cyst, right side: Secondary | ICD-10-CM

## 2014-04-11 DIAGNOSIS — Z90710 Acquired absence of both cervix and uterus: Secondary | ICD-10-CM | POA: Diagnosis not present

## 2014-04-11 DIAGNOSIS — I739 Peripheral vascular disease, unspecified: Secondary | ICD-10-CM | POA: Diagnosis not present

## 2014-04-11 DIAGNOSIS — Z01812 Encounter for preprocedural laboratory examination: Secondary | ICD-10-CM | POA: Diagnosis present

## 2014-04-11 DIAGNOSIS — R350 Frequency of micturition: Secondary | ICD-10-CM

## 2014-04-11 DIAGNOSIS — Z79899 Other long term (current) drug therapy: Secondary | ICD-10-CM | POA: Insufficient documentation

## 2014-04-11 DIAGNOSIS — N8320 Unspecified ovarian cysts: Secondary | ICD-10-CM | POA: Diagnosis not present

## 2014-04-11 HISTORY — DX: Cardiac murmur, unspecified: R01.1

## 2014-04-11 LAB — CBC
HCT: 39.6 % (ref 36.0–46.0)
Hemoglobin: 12.2 g/dL (ref 12.0–15.0)
MCH: 24.2 pg — ABNORMAL LOW (ref 26.0–34.0)
MCHC: 30.8 g/dL (ref 30.0–36.0)
MCV: 78.4 fL (ref 78.0–100.0)
Platelets: 276 10*3/uL (ref 150–400)
RBC: 5.05 MIL/uL (ref 3.87–5.11)
RDW: 17.7 % — ABNORMAL HIGH (ref 11.5–15.5)
WBC: 9.3 10*3/uL (ref 4.0–10.5)

## 2014-04-11 LAB — BASIC METABOLIC PANEL
Anion gap: 12 (ref 5–15)
BUN: 14 mg/dL (ref 6–23)
CO2: 23 mEq/L (ref 19–32)
Calcium: 9.3 mg/dL (ref 8.4–10.5)
Chloride: 104 mEq/L (ref 96–112)
Creatinine, Ser: 0.69 mg/dL (ref 0.50–1.10)
GFR calc Af Amer: >90 mL/min (ref >90)
GFR calc non Af Amer: >90 mL/min (ref >90)
Glucose, Bld: 92 mg/dL (ref 70–99)
Potassium: 4.1 mEq/L (ref 3.7–5.3)
Sodium: 139 mEq/L (ref 137–147)

## 2014-04-11 NOTE — Progress Notes (Signed)
Consult Note: Gyn-Onc  Consult had been requested by Dr. Phineas Real for the evaluation of Mary [name] 53 y.o. female with an 8x9cm minimally complex central cystic pelvic mass and borderline CA 125 (35).  CC:  Chief Complaint  Patient presents with  . Right ovarian cyst  Preoperative evaluation  Assessment/Plan:  Mary [name]  is a 53 y.o.  year old woman with an asymptomatic 8 x 9 cm mostly cystic (with thin septation) central pelvic mass, likely associated with her right ovary. Surgery has been recommended (laparoscopic RSO) and is scheduled for 04/17/14.  I again discussed that I have a low suspicion that this mass represents malignancy, however her given her borderline CA 125 (35 international units per milliliter) and the septation within given that she is postmenopausal removal of this cyst is reasonable. I discussed alternatives to surgery including close followup with serial imaging. I discussed that following transiently on 25 can also aid in ascertaining the likelihood of malignancy. I discussed that the only definitive way to determine if it was malignant benign would be for surgical excision. Overall she is a good surgical candidate. I did discuss with the patient that she has increased surgical risks compared to an average patient due to her her significant obesity (weight 254 pounds), and history of multiple prior surgeries which substantially increases her risk for perioperative complication including damage to internal organs necessitating laparotomy or reoperation. I discussed that the benefit to surgical removal would be definitive pathologic ascertainment, and if right salpingo-oophorectomy is performed at the time of surgery she would avoid a one in 10 risk for subsequent surgery for an ovarian cyst. She is an average risk for ovarian cancer with no first or second degree relatives with a history of ovarian or breast cancer.  The patient is determined to proceed with  surgical exploration and right salpingo-oophorectomy. We'll attempt this laparoscopically. We will perform surgical staging if ovarian malignancy is identified. If this is determined to be perineal inclusion cyst, or hydrosalpinx, the patient would elect for a right salpingo-oophorectomy to reduce the likelihood of requiring additional surgery in the future on the right ovary. I counseled her regarding the increase risks for early death in women who undergo BSO for benign indications younger than the age of 53. She understands this but would prefer to have definitive removal of ovaries at the time of the surgery regardless of whether or not the cystic mass originates from the ovary. I offered her an earlier surgical date at [facility] in [place] versus surgery here at in [place] in October 2015 (which are believe is a safe timeframe given a the low clinical suspicion for malignancy and a half based on her imaging and CA 125 evaluation). The patient is electing for a surgery performed here in [place] in October.   HPI: She now is a 53 year old G1P1 who was seen in consultation at the request of Dr. Phineas Real for an asymptomatic 9 cm central pelvic mostly simple cystic mass abutting the bladder. She has a history of a vaginal hysterectomy and left salpingo-oophorectomy in 2001 for a left ovarian cyst and benign disease in the uterus. She also has history of multiple other abdominal surgeries including an open appendectomy, cesarean section, and gastric bypass surgery. She is obese.  This mass was found incidentally on the CT scan of the lumbar spine performed in July 2015 [facility] for low back pain. It was followed up by a transvaginal ultrasound which identified a midline  cystic mass measuring 8.7 x 6.8 x 3.5 cm with a thin internal septations versus fold. The right ovary was seen to measure 5.7 x 4.4 x 8.3 cm it is unclear if this mass originated from the right ovary was separate to  it. A hydrosalpinx was considered to be less likely on the differential.   on 03/05/2014 CA 125 was borderline elevated at 35 units per milliliter.  She denies urinary frequency, though does report occasionally having to to her position to completely avoid her bladder. She denies a change in bowel habit, or narrowing of stools. She denies pelvic or abdominal pain. She denies dyspareunia.   Interval History: She continues to be asymptomatic from this mass with the exception of urinary frequency.  Current Meds:  Outpatient Encounter Prescriptions as of 04/11/2014  Medication Sig  . CYANOCOBALAMIN SL Place 1 each under the tongue daily.  Marland Kitchen EPINEPHrine (EPIPEN 2-PAK) 0.3 mg/0.3 mL IJ SOAJ injection 0.3 mg/0.3 mL  prn as needed  . HYDROcodone-acetaminophen (NORCO/VICODIN) 5-325 MG per tablet   . levothyroxine (SYNTHROID, LEVOTHROID) 100 MCG tablet Take 100 mcg by mouth daily before breakfast.  . oxyCODONE-acetaminophen (PERCOCET/ROXICET) 5-325 MG per tablet Take 1-2 tablets by mouth every 6 (six) hours as needed for moderate pain or severe pain.  Marland Kitchen Specialty Vitamins Products (CENTRUM SPECIALIST ENERGY PO) Take 1 tablet by mouth daily.  . traMADol (ULTRAM) 50 MG tablet Take by mouth every 6 (six) hours as needed.  . traZODone (DESYREL) 50 MG tablet Take 50 mg by mouth at bedtime as needed for sleep.  . Vitamin D, Ergocalciferol, (DRISDOL) 50000 UNITS CAPS capsule Take 50,000 Units by mouth every Monday.  . zolpidem (AMBIEN) 10 MG tablet Take 10 mg by mouth at bedtime.  . diazepam (VALIUM) 5 MG tablet   . meloxicam (MOBIC) 7.5 MG tablet   . [DISCONTINUED] cyclobenzaprine (FLEXERIL) 10 MG tablet Take 10 mg by mouth 3 (three) times daily as needed for muscle spasms.    Allergy:  Allergies  Allergen Reactions  . Citalopram Nausea Only    Social Hx:   History   Social History  . Marital Status: Married    Spouse Name: N/A    Number of Children: 2  . Years of Education: N/A   Occupational  History  . [employer]  . Smoking status: Former Smoker -- 0.30 packs/day for 4 years    Quit date: 07/11/2002  . Smokeless tobacco: Never Used  . Alcohol Use: 0.5 oz/week    1 drink(s) per week     Comment: occasional beer and wine  . Drug Use: No  . Sexual Activity: Yes    Birth Control/ Protection: Surgical     Comment: HYST   Other Topics Concern  . Not on file   Social History Narrative  . No narrative on file    Past Surgical Hx:  Past Surgical History  Procedure Laterality Date  . 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 Plantar fascia surgery  1999  . Cesarean section  1987    twins  . Appendectomy  1985  . Colonoscopy    . Laminectomy  07/10/2013    L4   L5   DR Ronnald Ramp  . Maximum access (mas)posterior lumbar interbody fusion (plif) 1 level  07/10/2013    Procedure: FOR MAXIMUM ACCESS (MAS) POSTERIOR LUMBAR INTERBODY FUSION (PLIF) LUMBAR FOUR-FIVE;  Surgeon: Eustace Moore, MD;  Location: [facility];  Service:  Neurosurgery;;  FOR MAXIMUM ACCESS (MAS) POSTERIOR LUMBAR INTERBODY FUSION (PLIF) LUMBAR FOUR-FIVE  . Oophorectomy      LSO  . Vaginal hysterectomy  2001    for bleeding and LSO for cyst    Past Medical Hx:  Past Medical History  Diagnosis Date  . Palpitations   . Hyperlipidemia   . PAC (premature atrial contraction)   . Hypothyroidism   . Pneumonia     "walking pneumonia"  . Shortness of breath     going upstairs (due to weight)  . Sleep apnea     after weight loss does not have to use cpap  . Peripheral vascular disease     "small" clot after thermal ablasion  . Arthritis   . Anemia     low iron at times  . Lactose intolerance   . Complication of anesthesia     Pt reports she awoke during plantar fascia surgery and felt "everything"  . Panic attacks     Pt reports restrictive clothing/areas and dry mouth cause her to have attack.  Marland Kitchen Heart murmur     " small per patient"     Past  Gynecological History:  G1P1 (cesarean section)  No LMP recorded. Patient has had a hysterectomy.  Family Hx:  Family History  Problem Relation Age of Onset  . Heart disease Mother   . Lung cancer Mother   . Diabetes Mother     later in life  . Coronary artery disease Mother   . Heart disease Father   . Arrhythmia Sister     Review of Systems:  Constitutional  Feels well,    ENT Normal appearing ears and nares bilaterally Skin/Breast  No rash, sores, jaundice, itching, dryness Cardiovascular  No chest pain, shortness of breath, or edema  Pulmonary  No cough or wheeze.  Gastro Intestinal  No nausea, vomitting, or diarrhoea. No bright red blood per rectum, no abdominal pain, change in bowel movement, or constipation.  Genito Urinary  No frequency, urgency, dysuria, see HPI Musculo Skeletal  No myalgia, arthralgia, joint swelling or pain  Neurologic  No weakness, numbness, change in gait,  Psychology  No depression, anxiety, insomnia.   Vitals:  Blood pressure 129/70, pulse 74, temperature 98.2 F (36.8 C), temperature source Oral, resp. rate 20, height 5\' 5"  (1.651 m), weight 255 lb 11.2 oz (115.985 kg).  Physical Exam: WD in NAD Neck  Supple NROM, without any enlargements.  Lymph Node Survey No cervical supraclavicular or inguinal adenopathy Cardiovascular  Pulse normal rate, regularity and rhythm. S1 and S2 normal.  Lungs  Clear to auscultation bilateraly, without wheezes/crackles/rhonchi. Good air movement.  Skin  No rash/lesions/breakdown  Psychiatry  Alert and oriented to person, place, and time  Abdomen  Normoactive bowel sounds, abdomen soft, non-tender and obese without evidence of hernia. Well healed laparoscopic and low transverse incisions from prior surgeries. Back No CVA tenderness Genito Urinary  Vulva/vagina: Normal external female genitalia.  No lesions. No discharge or bleeding.  Bladder/urethra:  No lesions or masses, well supported  bladder  Vagina: normal with normal support  Cervix: surgically absent  Uterus: surgically absent   Adnexa: no palpable discrete masses though there is cystic fullness anteriorally. Exam limited by body habitus.. Rectal  Good tone, no masses no cul de sac nodularity.  Extremities  No bilateral cyanosis, clubbing or edema.   Donaciano Eva, MD   04/11/2014, 2:39 PM

## 2014-04-11 NOTE — Progress Notes (Signed)
Patient would like to receive ice chips as soon after surgery as possible because when mouth gets dry she goes into a panic attack per patient and husband.

## 2014-04-11 NOTE — Progress Notes (Signed)
Patient does not want top of hand used for IV or blood draws due to previous injury to right hand.

## 2014-04-11 NOTE — Patient Instructions (Addendum)
Plan for surgery on Oct 8 with Dr. Denman George.  You will need to drink a bottle of magnesium citrate at 4 pm the evening before surgery.                Preparing for your Surgery  Pre-operative Testing  -Bring your insurance card, copy of an advanced directive if applicable, medication list  -At that visit, you will be asked to sign a consent for a possible blood transfusion in case a transfusion becomes necessary during surgery.  The need for a blood transfusion is rare but having consent is a necessary part of your care.     -You should not be taking blood thinners or aspirin at least ten days prior to surgery unless instructed by your surgeon.  Day Before Surgery at [facility] will be asked to take in only clear liquids the day before surgery.  Examples of clear liquids include broths, jello, and clear juices.  You will be advised to have nothing to eat or drink after midnight the evening before.    Your role in recovery Your role is to become active as soon as directed by your doctor, while still giving yourself time to heal.  Rest when you feel tired. You will be asked to do the following in order to speed your recovery:  - Cough and breathe deeply. This helps toclear and expand your lungs and can prevent pneumonia. You may be given a spirometer to practice deep breathing. A staff member will show you how to use the spirometer. - Do mild physical activity. Walking or moving your legs help your circulation and body functions return to normal. A staff member will help you when you try to walk and will provide you with simple exercises. Do not try to get up or walk alone the first time. - Actively manage your pain. Managing your pain lets you move in comfort. We will ask you to rate your pain on a scale of zero to 10. It is your responsibility to tell your doctor or nurse where and how much you hurt so your pain can be treated.  Special Considerations -If you are diabetic, you may be placed on  insulin after surgery to have closer control over your blood sugars to promote healing and recovery.  This does not mean that you will be discharged on insulin.  If applicable, your oral antidiabetics will be resumed when you are tolerating a solid diet.  -Your final pathology results from surgery should be available by the Friday after surgery and the results will be relayed to you when available.

## 2014-04-11 NOTE — Progress Notes (Signed)
EKG-07/13/13 EPIC  CXR- 07/01/13 EPIC  Stress 11/17/11 EPIC  LOV with Dr Angelena Form- 09/17/12 EPIC

## 2014-04-16 ENCOUNTER — Telehealth: Payer: Self-pay | Admitting: Gynecologic Oncology

## 2014-04-16 NOTE — Telephone Encounter (Signed)
Telephone call to check on pre-operative status.  Patient complaint with pre-operative instructions.  Reinforced NPO after midnight.  No questions or concerns voiced.  Instructed to call for any needs.

## 2014-04-17 ENCOUNTER — Encounter (HOSPITAL_COMMUNITY): Payer: Self-pay | Admitting: *Deleted

## 2014-04-17 ENCOUNTER — Ambulatory Visit (HOSPITAL_COMMUNITY): Payer: BC Managed Care – PPO | Admitting: Certified Registered"

## 2014-04-17 ENCOUNTER — Encounter (HOSPITAL_COMMUNITY): Payer: BC Managed Care – PPO | Admitting: Certified Registered"

## 2014-04-17 ENCOUNTER — Encounter (HOSPITAL_COMMUNITY)
Admission: RE | Disposition: A | Discharge status: Home / Self Care | Payer: Self-pay | Source: Ambulatory Visit | Attending: Gynecologic Oncology

## 2014-04-17 ENCOUNTER — Ambulatory Visit (HOSPITAL_COMMUNITY)
Admission: RE | Admit: 2014-04-17 | Discharge: 2014-04-17 | Disposition: A | Discharge status: Home / Self Care | Payer: BC Managed Care – PPO | Source: Ambulatory Visit | Attending: Gynecologic Oncology | Admitting: Gynecologic Oncology

## 2014-04-17 DIAGNOSIS — E039 Hypothyroidism, unspecified: Secondary | ICD-10-CM | POA: Insufficient documentation

## 2014-04-17 DIAGNOSIS — E669 Obesity, unspecified: Secondary | ICD-10-CM | POA: Insufficient documentation

## 2014-04-17 DIAGNOSIS — R002 Palpitations: Secondary | ICD-10-CM | POA: Diagnosis not present

## 2014-04-17 DIAGNOSIS — D270 Benign neoplasm of right ovary: Secondary | ICD-10-CM | POA: Diagnosis not present

## 2014-04-17 DIAGNOSIS — Z6841 Body Mass Index (BMI) 40.0 and over, adult: Secondary | ICD-10-CM | POA: Diagnosis not present

## 2014-04-17 DIAGNOSIS — Z9884 Bariatric surgery status: Secondary | ICD-10-CM | POA: Insufficient documentation

## 2014-04-17 DIAGNOSIS — R011 Cardiac murmur, unspecified: Secondary | ICD-10-CM | POA: Diagnosis not present

## 2014-04-17 DIAGNOSIS — E785 Hyperlipidemia, unspecified: Secondary | ICD-10-CM | POA: Insufficient documentation

## 2014-04-17 DIAGNOSIS — Z87891 Personal history of nicotine dependence: Secondary | ICD-10-CM | POA: Diagnosis not present

## 2014-04-17 DIAGNOSIS — I491 Atrial premature depolarization: Secondary | ICD-10-CM | POA: Diagnosis not present

## 2014-04-17 DIAGNOSIS — R1900 Intra-abdominal and pelvic swelling, mass and lump, unspecified site: Secondary | ICD-10-CM | POA: Diagnosis present

## 2014-04-17 DIAGNOSIS — K660 Peritoneal adhesions (postprocedural) (postinfection): Secondary | ICD-10-CM | POA: Insufficient documentation

## 2014-04-17 DIAGNOSIS — N8320 Unspecified ovarian cysts: Secondary | ICD-10-CM

## 2014-04-17 DIAGNOSIS — F410 Panic disorder [episodic paroxysmal anxiety] without agoraphobia: Secondary | ICD-10-CM | POA: Insufficient documentation

## 2014-04-17 DIAGNOSIS — N83201 Unspecified ovarian cyst, right side: Secondary | ICD-10-CM | POA: Diagnosis present

## 2014-04-17 DIAGNOSIS — G4730 Sleep apnea, unspecified: Secondary | ICD-10-CM | POA: Insufficient documentation

## 2014-04-17 DIAGNOSIS — R0602 Shortness of breath: Secondary | ICD-10-CM | POA: Diagnosis not present

## 2014-04-17 HISTORY — PX: LAPAROSCOPIC SALPINGO OOPHERECTOMY: SHX5927

## 2014-04-17 HISTORY — PX: LYSIS OF ADHESION: SHX5961

## 2014-04-17 LAB — ABO/RH: ABO/RH(D): B POS

## 2014-04-17 LAB — TYPE AND SCREEN
ABO/RH(D): B POS
Antibody Screen: NEGATIVE

## 2014-04-17 SURGERY — SALPINGO-OOPHORECTOMY, LAPAROSCOPIC
Anesthesia: General | Laterality: Right

## 2014-04-17 MED ORDER — LIDOCAINE HCL (CARDIAC) 20 MG/ML IV SOLN
INTRAVENOUS | Status: AC
  Filled 2014-04-17: qty 5

## 2014-04-17 MED ORDER — NEOSTIGMINE METHYLSULFATE 10 MG/10ML IV SOLN
INTRAVENOUS | Status: DC | PRN
  Administered 2014-04-17: 3 mg via INTRAVENOUS

## 2014-04-17 MED ORDER — GLYCOPYRROLATE 0.2 MG/ML IJ SOLN
INTRAMUSCULAR | Status: DC | PRN
  Administered 2014-04-17: 0.2 mg via INTRAVENOUS
  Administered 2014-04-17: 0.4 mg via INTRAVENOUS

## 2014-04-17 MED ORDER — HYDROMORPHONE HCL 1 MG/ML IJ SOLN
INTRAMUSCULAR | Status: DC | PRN
  Administered 2014-04-17 (×2): 1 mg via INTRAVENOUS

## 2014-04-17 MED ORDER — 0.9 % SODIUM CHLORIDE (POUR BTL) OPTIME
TOPICAL | Status: DC | PRN
  Administered 2014-04-17: 1000 mL

## 2014-04-17 MED ORDER — SUCCINYLCHOLINE CHLORIDE 20 MG/ML IJ SOLN
INTRAMUSCULAR | Status: DC | PRN
  Administered 2014-04-17: 100 mg via INTRAVENOUS

## 2014-04-17 MED ORDER — HEPARIN SODIUM (PORCINE) 1000 UNIT/ML IJ SOLN
INTRAMUSCULAR | Status: AC
  Filled 2014-04-17: qty 1

## 2014-04-17 MED ORDER — HYDROMORPHONE HCL 2 MG/ML IJ SOLN
INTRAMUSCULAR | Status: AC
  Filled 2014-04-17: qty 1

## 2014-04-17 MED ORDER — GLYCOPYRROLATE 0.2 MG/ML IJ SOLN
INTRAMUSCULAR | Status: AC
  Filled 2014-04-17: qty 1

## 2014-04-17 MED ORDER — PROPOFOL 10 MG/ML IV BOLUS
INTRAVENOUS | Status: AC
  Filled 2014-04-17: qty 20

## 2014-04-17 MED ORDER — ROCURONIUM BROMIDE 100 MG/10ML IV SOLN
INTRAVENOUS | Status: AC
  Filled 2014-04-17: qty 1

## 2014-04-17 MED ORDER — MIDAZOLAM HCL 2 MG/2ML IJ SOLN
INTRAMUSCULAR | Status: AC
  Filled 2014-04-17: qty 2

## 2014-04-17 MED ORDER — FENTANYL CITRATE 0.05 MG/ML IJ SOLN
INTRAMUSCULAR | Status: AC
  Filled 2014-04-17: qty 5

## 2014-04-17 MED ORDER — DOCUSATE SODIUM 100 MG PO CAPS: 100.0000 mg | ORAL_CAPSULE | Freq: Two times a day (BID) | ORAL | Status: DC

## 2014-04-17 MED ORDER — ONDANSETRON HCL 4 MG/2ML IJ SOLN
INTRAMUSCULAR | Status: DC | PRN
  Administered 2014-04-17: 4 mg via INTRAVENOUS

## 2014-04-17 MED ORDER — DEXAMETHASONE SODIUM PHOSPHATE 10 MG/ML IJ SOLN
INTRAMUSCULAR | Status: DC | PRN
  Administered 2014-04-17: 10 mg via INTRAVENOUS

## 2014-04-17 MED ORDER — PROPOFOL 10 MG/ML IV BOLUS
INTRAVENOUS | Status: DC | PRN
  Administered 2014-04-17: 150 mg via INTRAVENOUS

## 2014-04-17 MED ORDER — LACTATED RINGERS IR SOLN
Status: DC | PRN
  Administered 2014-04-17: 1000 mL

## 2014-04-17 MED ORDER — LIDOCAINE HCL (PF) 2 % IJ SOLN
INTRAMUSCULAR | Status: DC | PRN
  Administered 2014-04-17: 20 mg via INTRADERMAL

## 2014-04-17 MED ORDER — ACETAMINOPHEN 10 MG/ML IV SOLN
1000.0000 mg | Freq: Four times a day (QID) | INTRAVENOUS | Status: DC
  Administered 2014-04-17: 1000 mg via INTRAVENOUS
  Filled 2014-04-17 (×4): qty 100

## 2014-04-17 MED ORDER — LACTATED RINGERS IV SOLN: INTRAVENOUS | Status: DC

## 2014-04-17 MED ORDER — LACTATED RINGERS IV SOLN
INTRAVENOUS | Status: DC | PRN
  Administered 2014-04-17 (×2): via INTRAVENOUS

## 2014-04-17 MED ORDER — HYDROMORPHONE HCL 1 MG/ML IJ SOLN: 0.2500 mg | INTRAMUSCULAR | Status: DC | PRN

## 2014-04-17 MED ORDER — GLYCOPYRROLATE 0.2 MG/ML IJ SOLN
INTRAMUSCULAR | Status: AC
  Filled 2014-04-17: qty 2

## 2014-04-17 MED ORDER — ROCURONIUM BROMIDE 100 MG/10ML IV SOLN
INTRAVENOUS | Status: DC | PRN
  Administered 2014-04-17: 20 mg via INTRAVENOUS
  Administered 2014-04-17: 30 mg via INTRAVENOUS

## 2014-04-17 MED ORDER — BUPIVACAINE HCL (PF) 0.5 % IJ SOLN
INTRAMUSCULAR | Status: DC | PRN
  Administered 2014-04-17: 10 mL

## 2014-04-17 MED ORDER — BUPIVACAINE HCL (PF) 0.5 % IJ SOLN
INTRAMUSCULAR | Status: AC
  Filled 2014-04-17: qty 30

## 2014-04-17 MED ORDER — IBUPROFEN 800 MG PO TABS: 800.0000 mg | ORAL_TABLET | Freq: Three times a day (TID) | ORAL | Status: DC | PRN

## 2014-04-17 MED ORDER — DEXAMETHASONE SODIUM PHOSPHATE 10 MG/ML IJ SOLN
INTRAMUSCULAR | Status: AC
  Filled 2014-04-17: qty 1

## 2014-04-17 MED ORDER — MIDAZOLAM HCL 5 MG/5ML IJ SOLN
INTRAMUSCULAR | Status: DC | PRN
  Administered 2014-04-17: 2 mg via INTRAVENOUS

## 2014-04-17 MED ORDER — ONDANSETRON HCL 4 MG/2ML IJ SOLN
INTRAMUSCULAR | Status: AC
  Filled 2014-04-17: qty 2

## 2014-04-17 MED ORDER — FENTANYL CITRATE 0.05 MG/ML IJ SOLN
INTRAMUSCULAR | Status: DC | PRN
  Administered 2014-04-17 (×3): 50 ug via INTRAVENOUS
  Administered 2014-04-17: 100 ug via INTRAVENOUS

## 2014-04-17 MED ORDER — OXYCODONE-ACETAMINOPHEN 5-325 MG PO TABS: 1.0000 | ORAL_TABLET | ORAL | Status: DC | PRN

## 2014-04-17 SURGICAL SUPPLY — 41 items
APPLICATOR SURGIFLO ENDO (HEMOSTASIS) ×3 IMPLANT
CABLE HIGH FREQUENCY MONO STRZ (ELECTRODE) ×3 IMPLANT
CHLORAPREP W/TINT 26ML (MISCELLANEOUS) ×6 IMPLANT
CONT SPEC 4OZ CLIKSEAL STRL BL (MISCELLANEOUS) IMPLANT
DERMABOND ADVANCED (GAUZE/BANDAGES/DRESSINGS) ×1
DERMABOND ADVANCED .7 DNX12 (GAUZE/BANDAGES/DRESSINGS) ×2 IMPLANT
DEVICE SUTURE ENDOST 10MM (ENDOMECHANICALS) ×3 IMPLANT
DRAPE UTILITY XL STRL (DRAPES) IMPLANT
ELECT REM PT RETURN 9FT ADLT (ELECTROSURGICAL) ×3
ELECTRODE REM PT RTRN 9FT ADLT (ELECTROSURGICAL) ×2 IMPLANT
ENDOSTITCH 0 SINGLE 48 (SUTURE) ×3 IMPLANT
FLOSEAL 10ML (HEMOSTASIS) ×3 IMPLANT
GLOVE BIO SURGEON STRL SZ 6 (GLOVE) ×6 IMPLANT
GLOVE BIO SURGEON STRL SZ 6.5 (GLOVE) ×9 IMPLANT
GOWN STRL NON-REIN LRG LVL3 (GOWN DISPOSABLE) ×6 IMPLANT
HOLDER FOLEY CATH W/STRAP (MISCELLANEOUS) IMPLANT
HOVERMATT HALF SINGLE USE (PATIENT TRANSFER) ×3 IMPLANT
KIT BASIN OR (CUSTOM PROCEDURE TRAY) ×3 IMPLANT
MANIPULATOR UTERINE 4.5 ZUMI (MISCELLANEOUS) IMPLANT
POSITIONER SURGICAL ARM (MISCELLANEOUS) ×6 IMPLANT
POUCH SPECIMEN RETRIEVAL 10MM (ENDOMECHANICALS) ×3 IMPLANT
SCISSORS LAP 5X35 DISP (ENDOMECHANICALS) ×6 IMPLANT
SEALER TISSUE G2 CVD JAW 35 (ENDOMECHANICALS) ×2 IMPLANT
SEALER TISSUE G2 CVD JAW 45CM (ENDOMECHANICALS) ×1
SET IRRIG TUBING LAPAROSCOPIC (IRRIGATION / IRRIGATOR) IMPLANT
SHEET LAVH (DRAPES) ×3 IMPLANT
SLEEVE XCEL OPT CAN 5 100 (ENDOMECHANICALS) ×6 IMPLANT
SUT VIC AB 3-0 PS2 18 (SUTURE)
SUT VIC AB 3-0 PS2 18XBRD (SUTURE) IMPLANT
SUT VIC AB 4-0 PS2 27 (SUTURE) ×6 IMPLANT
TOWEL OR 17X26 10 PK STRL BLUE (TOWEL DISPOSABLE) ×3 IMPLANT
TOWEL OR NON WOVEN STRL DISP B (DISPOSABLE) ×3 IMPLANT
TRAP SPECIMEN MUCOUS 40CC (MISCELLANEOUS) ×3 IMPLANT
TRAY FOLEY CATH 14FRSI W/METER (CATHETERS) ×3 IMPLANT
TRAY LAP CHOLE (CUSTOM PROCEDURE TRAY) IMPLANT
TRAY LAPAROSCOPIC (CUSTOM PROCEDURE TRAY) ×3 IMPLANT
TROCAR BLADELESS OPT 5 100 (ENDOMECHANICALS) ×3 IMPLANT
TROCAR BLADELESS OPT 5 75 (ENDOMECHANICALS) IMPLANT
TROCAR XCEL 12X100 BLDLESS (ENDOMECHANICALS) ×3 IMPLANT
TROCAR XCEL BLUNT TIP 100MML (ENDOMECHANICALS) IMPLANT
TUBING NON-CON 1/4 X 20 CONN (TUBING) IMPLANT

## 2014-04-17 NOTE — Interval H&P Note (Signed)
History and Physical Interval Note:  04/17/2014 7:13 AM  Mary [name]  has presented today for surgery, with the diagnosis of PELVIC MASS  The various methods of treatment have been discussed with the patient and family. After consideration of risks, benefits and other options for treatment, the patient has consented to  Procedure(s): LAPAROSCOPIC SALPINGO OOPHORECTOMY RIGHT (Right) as a surgical intervention .  The patient's history has been reviewed, patient examined, no change in status, stable for surgery.  I have reviewed the patient's chart and labs.  Questions were answered to the patient's satisfaction.     Donaciano Eva

## 2014-04-17 NOTE — H&P (View-Only) (Signed)
Consult Note: Gyn-Onc  Consult had been requested by Dr. Phineas Real for the evaluation of Mary [name] 53 y.o. female with an 8x9cm minimally complex central cystic pelvic mass and borderline CA 125 (35).  CC:  Chief Complaint  Patient presents with  . Right ovarian cyst  Preoperative evaluation  Assessment/Plan:  Mary [name]  is a 53 y.o.  year old woman with an asymptomatic 8 x 9 cm mostly cystic (with thin septation) central pelvic mass, likely associated with her right ovary. Surgery has been recommended (laparoscopic RSO) and is scheduled for 04/17/14.  I again discussed that I have a low suspicion that this mass represents malignancy, however her given her borderline CA 125 (35 international units per milliliter) and the septation within given that she is postmenopausal removal of this cyst is reasonable. I discussed alternatives to surgery including close followup with serial imaging. I discussed that following transiently on 25 can also aid in ascertaining the likelihood of malignancy. I discussed that the only definitive way to determine if it was malignant benign would be for surgical excision. Overall she is a good surgical candidate. I did discuss with the patient that she has increased surgical risks compared to an average patient due to her her significant obesity (weight 254 pounds), and history of multiple prior surgeries which substantially increases her risk for perioperative complication including damage to internal organs necessitating laparotomy or reoperation. I discussed that the benefit to surgical removal would be definitive pathologic ascertainment, and if right salpingo-oophorectomy is performed at the time of surgery she would avoid a one in 10 risk for subsequent surgery for an ovarian cyst. She is an average risk for ovarian cancer with no first or second degree relatives with a history of ovarian or breast cancer.  The patient is determined to proceed with  surgical exploration and right salpingo-oophorectomy. We'll attempt this laparoscopically. We will perform surgical staging if ovarian malignancy is identified. If this is determined to be perineal inclusion cyst, or hydrosalpinx, the patient would elect for a right salpingo-oophorectomy to reduce the likelihood of requiring additional surgery in the future on the right ovary. I counseled her regarding the increase risks for early death in women who undergo BSO for benign indications younger than the age of 38. She understands this but would prefer to have definitive removal of ovaries at the time of the surgery regardless of whether or not the cystic mass originates from the ovary. I offered her an earlier surgical date at [facility] in [place] versus surgery here at in [place] in October 2015 (which are believe is a safe timeframe given a the low clinical suspicion for malignancy and a half based on her imaging and CA 125 evaluation). The patient is electing for a surgery performed here in [place] in October.   HPI: She now is a 53 year old G1P1 who was seen in consultation at the request of Dr. Phineas Real for an asymptomatic 9 cm central pelvic mostly simple cystic mass abutting the bladder. She has a history of a vaginal hysterectomy and left salpingo-oophorectomy in 2001 for a left ovarian cyst and benign disease in the uterus. She also has history of multiple other abdominal surgeries including an open appendectomy, cesarean section, and gastric bypass surgery. She is obese.  This mass was found incidentally on the CT scan of the lumbar spine performed in July 2015 [facility] for low back pain. It was followed up by a transvaginal ultrasound which identified a midline  cystic mass measuring 8.7 x 6.8 x 3.5 cm with a thin internal septations versus fold. The right ovary was seen to measure 5.7 x 4.4 x 8.3 cm it is unclear if this mass originated from the right ovary was separate to  it. A hydrosalpinx was considered to be less likely on the differential.   on 03/05/2014 CA 125 was borderline elevated at 35 units per milliliter.  She denies urinary frequency, though does report occasionally having to to her position to completely avoid her bladder. She denies a change in bowel habit, or narrowing of stools. She denies pelvic or abdominal pain. She denies dyspareunia.   Interval History: She continues to be asymptomatic from this mass with the exception of urinary frequency.  Current Meds:  Outpatient Encounter Prescriptions as of 04/11/2014  Medication Sig  . CYANOCOBALAMIN SL Place 1 each under the tongue daily.  Marland Kitchen EPINEPHrine (EPIPEN 2-PAK) 0.3 mg/0.3 mL IJ SOAJ injection 0.3 mg/0.3 mL  prn as needed  . HYDROcodone-acetaminophen (NORCO/VICODIN) 5-325 MG per tablet   . levothyroxine (SYNTHROID, LEVOTHROID) 100 MCG tablet Take 100 mcg by mouth daily before breakfast.  . oxyCODONE-acetaminophen (PERCOCET/ROXICET) 5-325 MG per tablet Take 1-2 tablets by mouth every 6 (six) hours as needed for moderate pain or severe pain.  Marland Kitchen Specialty Vitamins Products (CENTRUM SPECIALIST ENERGY PO) Take 1 tablet by mouth daily.  . traMADol (ULTRAM) 50 MG tablet Take by mouth every 6 (six) hours as needed.  . traZODone (DESYREL) 50 MG tablet Take 50 mg by mouth at bedtime as needed for sleep.  . Vitamin D, Ergocalciferol, (DRISDOL) 50000 UNITS CAPS capsule Take 50,000 Units by mouth every Monday.  . zolpidem (AMBIEN) 10 MG tablet Take 10 mg by mouth at bedtime.  . diazepam (VALIUM) 5 MG tablet   . meloxicam (MOBIC) 7.5 MG tablet   . [DISCONTINUED] cyclobenzaprine (FLEXERIL) 10 MG tablet Take 10 mg by mouth 3 (three) times daily as needed for muscle spasms.    Allergy:  Allergies  Allergen Reactions  . Citalopram Nausea Only    Social Hx:   History   Social History  . Marital Status: Married    Spouse Name: N/A    Number of Children: 2  . Years of Education: N/A   Occupational  History  . [employer]  . Smoking status: Former Smoker -- 0.30 packs/day for 4 years    Quit date: 07/11/2002  . Smokeless tobacco: Never Used  . Alcohol Use: 0.5 oz/week    1 drink(s) per week     Comment: occasional beer and wine  . Drug Use: No  . Sexual Activity: Yes    Birth Control/ Protection: Surgical     Comment: HYST   Other Topics Concern  . Not on file   Social History Narrative  . No narrative on file    Past Surgical Hx:  Past Surgical History  Procedure Laterality Date  . 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 Plantar fascia surgery  1999  . Cesarean section  1987    twins  . Appendectomy  1985  . Colonoscopy    . Laminectomy  07/10/2013    L4   L5   DR Ronnald Ramp  . Maximum access (mas)posterior lumbar interbody fusion (plif) 1 level  07/10/2013    Procedure: FOR MAXIMUM ACCESS (MAS) POSTERIOR LUMBAR INTERBODY FUSION (PLIF) LUMBAR FOUR-FIVE;  Surgeon: Eustace Moore, MD;  Location: [facility];  Service:  Neurosurgery;;  FOR MAXIMUM ACCESS (MAS) POSTERIOR LUMBAR INTERBODY FUSION (PLIF) LUMBAR FOUR-FIVE  . Oophorectomy      LSO  . Vaginal hysterectomy  2001    for bleeding and LSO for cyst    Past Medical Hx:  Past Medical History  Diagnosis Date  . Palpitations   . Hyperlipidemia   . PAC (premature atrial contraction)   . Hypothyroidism   . Pneumonia     "walking pneumonia"  . Shortness of breath     going upstairs (due to weight)  . Sleep apnea     after weight loss does not have to use cpap  . Peripheral vascular disease     "small" clot after thermal ablasion  . Arthritis   . Anemia     low iron at times  . Lactose intolerance   . Complication of anesthesia     Pt reports she awoke during plantar fascia surgery and felt "everything"  . Panic attacks     Pt reports restrictive clothing/areas and dry mouth cause her to have attack.  Marland Kitchen Heart murmur     " small per patient"     Past  Gynecological History:  G1P1 (cesarean section)  No LMP recorded. Patient has had a hysterectomy.  Family Hx:  Family History  Problem Relation Age of Onset  . Heart disease Mother   . Lung cancer Mother   . Diabetes Mother     later in life  . Coronary artery disease Mother   . Heart disease Father   . Arrhythmia Sister     Review of Systems:  Constitutional  Feels well,    ENT Normal appearing ears and nares bilaterally Skin/Breast  No rash, sores, jaundice, itching, dryness Cardiovascular  No chest pain, shortness of breath, or edema  Pulmonary  No cough or wheeze.  Gastro Intestinal  No nausea, vomitting, or diarrhoea. No bright red blood per rectum, no abdominal pain, change in bowel movement, or constipation.  Genito Urinary  No frequency, urgency, dysuria, see HPI Musculo Skeletal  No myalgia, arthralgia, joint swelling or pain  Neurologic  No weakness, numbness, change in gait,  Psychology  No depression, anxiety, insomnia.   Vitals:  Blood pressure 129/70, pulse 74, temperature 98.2 F (36.8 C), temperature source Oral, resp. rate 20, height 5\' 5"  (1.651 m), weight 255 lb 11.2 oz (115.985 kg).  Physical Exam: WD in NAD Neck  Supple NROM, without any enlargements.  Lymph Node Survey No cervical supraclavicular or inguinal adenopathy Cardiovascular  Pulse normal rate, regularity and rhythm. S1 and S2 normal.  Lungs  Clear to auscultation bilateraly, without wheezes/crackles/rhonchi. Good air movement.  Skin  No rash/lesions/breakdown  Psychiatry  Alert and oriented to person, place, and time  Abdomen  Normoactive bowel sounds, abdomen soft, non-tender and obese without evidence of hernia. Well healed laparoscopic and low transverse incisions from prior surgeries. Back No CVA tenderness Genito Urinary  Vulva/vagina: Normal external female genitalia.  No lesions. No discharge or bleeding.  Bladder/urethra:  No lesions or masses, well supported  bladder  Vagina: normal with normal support  Cervix: surgically absent  Uterus: surgically absent   Adnexa: no palpable discrete masses though there is cystic fullness anteriorally. Exam limited by body habitus.. Rectal  Good tone, no masses no cul de sac nodularity.  Extremities  No bilateral cyanosis, clubbing or edema.   Donaciano Eva, MD   04/11/2014, 2:39 PM

## 2014-04-17 NOTE — Anesthesia Preprocedure Evaluation (Addendum)
Anesthesia Evaluation  Patient identified by MRN, date of birth, ID band Patient awake    Reviewed: Allergy & Precautions, H&P , NPO status , Patient's Chart, lab work & pertinent test results  Airway Mallampati: III TM Distance: >3 FB Neck ROM: full    Dental  (+) Caps, Dental Advisory Given All front teeth are capped:   Pulmonary neg pulmonary ROS, shortness of breath and with exertion, sleep apnea , former smoker,  Improvement in sleep apnea after weight loss breath sounds clear to auscultation  Pulmonary exam normal       Cardiovascular Exercise Tolerance: Good negative cardio ROS  Rhythm:regular Rate:Normal  Palpitations. PACs   Neuro/Psych Anxiety Panic attacks.  Feels like can't breath when mouth gets dry.negative neurological ROS  negative psych ROS   GI/Hepatic negative GI ROS, Neg liver ROS,   Endo/Other  negative endocrine ROSHypothyroidism Morbid obesity  Renal/GU negative Renal ROS  negative genitourinary   Musculoskeletal   Abdominal (+) + obese,   Peds  Hematology negative hematology ROS (+)   Anesthesia Other Findings   Reproductive/Obstetrics negative OB ROS                         Anesthesia Physical Anesthesia Plan  ASA: III  Anesthesia Plan: General   Post-op Pain Management:    Induction: Intravenous  Airway Management Planned: Oral ETT  Additional Equipment:   Intra-op Plan:   Post-operative Plan: Extubation in OR  Informed Consent: I have reviewed the patients History and Physical, chart, labs and discussed the procedure including the risks, benefits and alternatives for the proposed anesthesia with the patient or authorized representative who has indicated his/her understanding and acceptance.   Dental Advisory Given  Plan Discussed with: CRNA and Surgeon  Anesthesia Plan Comments:         Anesthesia Quick Evaluation

## 2014-04-17 NOTE — Op Note (Signed)
Surgeon: Donaciano Eva   Co-Surgeon: Dr Nancy Marus (an MD co-surgeon was necessary for tissue manipulation, management of laparoscopi instrumentation, retraction and positioning due to the complexity of the case and hospital policies).    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 Adhesions between omentum and anterior abdominal wall and between right tube and ovary and pelvic sidewall. Benign serous cystadenoma on frozen section. 1cm defect in vaginal cuff closed primarily with 0-vicryl   Procedure Details  The patient was seen in the Holding Room. The risks, benefits, complications, treatment options, and expected outcomes were discussed with the patient.  The patient concurred with the proposed plan, giving informed consent.  The site of surgery properly noted/marked. The patient was identified as Mary [name] and the procedure verified as a laparoscopic right salpingo-oophorectomy. A Time Out was held and the above information confirmed.  After induction of anesthesia, the patient was draped and prepped in the usual sterile manner. Pt was placed in supine position after anesthesia and draped and prepped in the usual sterile manner. The abdominal drape was placed after the CholoraPrep had been allowed to dry for 3 minutes.  Her arms were tucked to her side with all appropriate precautions.  The chest was secured to the table.  The patient was placed in the semi-lithotomy position in [place].  The perineum was prepped with Betadine.   Foley catheter was placed.  A sponge stick was placed in the vagina.  A second time-out was performed.  OG tube placement was confirmed and to suction.     Procedure:  Quarter percent marcaine was injected into all incision sites prior to making them with an 11 blade scalpel. A 5 mm skin incision was in palmer's point left upper quadrant. The 5 mm Optiview port and scope was used for direct entry.  Opening pressure was under 10 mm CO2.  The abdomen was insufflated and the findings were noted as above.   At this point and all points during the procedure, the patient's intra-abdominal pressure did not exceed 15 mmHg. The patient was placed in steep trendelenberg to visualize pelvic anatomy. Next, a 5 mm skin incision was made in the left lateral lower abdomen and a 5 mm disposable port was placed through this intraperitoneally under direct visualization.  A 58mm incision was made 2cm cephalad to the pubic symphysis in the midline and a 42mm port was placed through this under direct visualization. Through these ports adhesiolysis was performed sharply with monopolar scissors to free the omental adhesions to the anterior abdominal wall and between cecum and right lower quadrant. A 73mm incision was made in the right lower abdomen and a 5 mm disposable port was placed through this intraperitoneally under direct visualization. Bowel was away into the upper abdomen.    The right retroperitoneum was opened with monopolar shears parallel to the right IP ligament to enter the retroperitoneal space. Using careful dissection with the shears and a blunt grasper, the pararectal space was opened to identify the right ureter in the medial leaf of the broad ligament. A window was sharply created above the level  of the ureter to skeletonize both the right IP ligament which was then fulgarated to seal it with the enseal device, and then transected. Using sharp dissection for approximately 30 minutes, the dense adhesions between  the right ovarian mass and the right ovarian fossa peritoneum, bladder and vaginal cuff were taken down with sharp dissection. It was necessary to enter the vagina in the midline during this dissection in order to remove the intact ovary. This freed the right tube and ovary for specimen removal. The right ovary and tube was placed in an endocatch bag intact and retrieved from the suprapubic port. The specimen was sent for frozen section which revealed benign serous cystadenoma. A 0-vicryl on an endostitch was used to close the vaginal cuff in a figure of eight suture. Hemostasis was achieved at the surgical bed with bipolar energy to focal bleeding sites and floseal application to the raw surface area in the central pelvis.  At this point in the procedure was completed.  Ports were removed under direct visualization. All entry sites were hemostatic. CO2 insufflation was removed from the patient's abdomen.  The 10 mm port was closed with Vicryl on a UR-5 needle at the fascia.  The skin was closed with 4-0 Vicryl in a subcuticular manner.  Dermabond was applied to the incisions.  Sponge, lap and needle counts correct x 2.  The manipulator was retrieved from the uterus.The patient was taken to the recovery room in stable condition.  The vagina was swabbed with  minimal bleeding noted.   All instrument and needle counts were correct x  3.   The patient was transferred to the recovery room in a stable condition.  Estimated Blood Loss:  less than 50 mL      Total IV Fluids: 1,000 ml         Specimens: PATHOLOGY (right tube and ovary)         Complications:  None; patient tolerated the procedure well.         Disposition: PACU - hemodynamically stable.

## 2014-04-17 NOTE — Transfer of Care (Signed)
Immediate Anesthesia Transfer of Care Note  Patient: Mary [name]  Procedure(s) Performed: Procedure(s) (LRB): LAPAROSCOPIC SALPINGO OOPHORECTOMY RIGHT (Right) LYSIS OF ADHESION  Patient Location: PACU  Anesthesia Type: General  Level of Consciousness: sedated, patient cooperative and responds to stimulation  Airway & Oxygen Therapy: Patient Spontanous Breathing and Patient connected to face mask oxgen  Post-op Assessment: Report given to PACU RN and Post -op Vital signs reviewed and stable  Post vital signs: Reviewed and stable  Complications: No apparent anesthesia complications

## 2014-04-17 NOTE — Anesthesia Postprocedure Evaluation (Signed)
  Anesthesia Post-op Note  Patient: Mary [name]  Procedure(s) Performed: Procedure(s) (LRB): LAPAROSCOPIC SALPINGO OOPHORECTOMY RIGHT (Right) LYSIS OF ADHESION  Patient Location: PACU  Anesthesia Type: General  Level of Consciousness: awake and alert   Airway and Oxygen Therapy: Patient Spontanous Breathing  Post-op Pain: mild  Post-op Assessment: Post-op Vital signs reviewed, Patient's Cardiovascular Status Stable, Respiratory Function Stable, Patent Airway and No signs of Nausea or vomiting  Last Vitals:  Filed Vitals:   04/17/14 1000  BP: 173/89  Pulse: 51  Temp:   Resp: 12    Post-op Vital Signs: stable   Complications: No apparent anesthesia complications

## 2014-04-17 NOTE — Discharge Instructions (Signed)
Unilateral Salpingo-Oophorectomy, Care After Refer to this sheet in the next few weeks. These instructions provide you with information on caring for yourself after your procedure. Your health care provider may also give you more specific instructions. Your treatment has been planned according to current medical practices, but problems sometimes occur. Call your health care provider if you have any problems or questions after your procedure. WHAT TO EXPECT AFTER THE PROCEDURE After your procedure, it is typical to have the following:  Abdominal pain that can be controlled with pain medicine.  Vaginal spotting. Please call if this is heavier than a period. You will experience this because a small incision was made at the top of the vagina to separate the right ovary from the vagina.   Constipation. HOME CARE INSTRUCTIONS   Get plenty of rest and sleep.  Only take over-the-counter or prescription medicines as directed by your health care provider. Do not take aspirin. It can cause bleeding.  Keep incision areas clean and dry. Remove or change any bandages (dressings) only as directed by your health care provider.  Follow your health care provider's advice regarding diet.  Drink enough fluids to keep your urine clear or pale yellow.  Limit exercise and activities as directed by your health care provider (mild activity, no strenuous working out for 4 weeks). Do not lift anything heavier than 5 pounds (2.3 kg) until your health care provider approves (2 weeks).  You should expect to be able to return to work in approximately 2-4 weeks pending your level of pain (it is medically safe to return to work after 2 weeks).  Do not drive until your health care provider approves (1 week).  Do not drink alcohol until your health care provider approves.  Do not have sexual intercourse until your health care provider says it is OK (6 weeks).  Take your temperature twice a day and write it down.  If  you become constipated, you may:  Ask your health care provider about taking a mild laxative.  Add more fruit and bran to your diet.  Drink more fluids.  Follow up with your health care provider as directed. SEEK MEDICAL CARE IF:   You have swelling or redness in the incision areas.  You develop a rash.  You feel lightheaded.  You have pain that is not controlled with medicine.  You have pain, swelling, or redness where the IV access tube was placed.  SEEK IMMEDIATE MEDICAL CARE IF:  You have a fever.  You develop increasing abdominal pain.  You see pus coming out of the incision, or the incision is separating.  You notice a bad smell coming from the wound or dressing.  You have excessive vaginal bleeding.  You feel sick to your stomach (nauseous) and vomit.  You have leg or chest pain.  You have pain when you urinate.  You develop shortness of breath.  You pass out. Document Released: 04/23/2009 Document Revised: 04/17/2013 Document Reviewed: 12/19/2012 [facility] Patient Information 2015 [place], [place]. This information is not intended to replace advice given to you by your health care provider. Make sure you discuss any questions you have with your health care provider.  Post Anesthesia Home Care Instructions  Activity: Get plenty of rest for the remainder of the day. A responsible adult should stay with you for 24 hours following the procedure.  For the next 24 hours, DO NOT: -Drive a car -Paediatric nurse -Drink alcoholic beverages -Take any medication unless instructed by your physician -Make  any legal decisions or sign important papers.  Meals: Start with liquid foods such as gelatin or soup. Progress to regular foods as tolerated. Avoid greasy, spicy, heavy foods. If nausea and/or vomiting occur, drink only clear liquids until the nausea and/or vomiting subsides. Call your physician if vomiting continues.  Special Instructions/Symptoms: Your throat  may feel dry or sore from the anesthesia or the breathing tube placed in your throat during surgery. If this causes discomfort, gargle with warm salt water. The discomfort should disappear within 24 hours.

## 2014-04-18 ENCOUNTER — Telehealth: Payer: Self-pay | Admitting: Gynecologic Oncology

## 2014-04-18 ENCOUNTER — Encounter (HOSPITAL_COMMUNITY): Payer: Self-pay | Admitting: Gynecologic Oncology

## 2014-04-18 NOTE — Telephone Encounter (Signed)
Post op telephone call to check patient status.  Patient describes expected post operative status.  Adequate PO intake reported.  Bowels and bladder functioning without difficulty.  Pain minimal.  Reportable signs and symptoms reviewed.  Follow up appt given.  Final pathology reviewed.

## 2014-04-25 ENCOUNTER — Encounter: Payer: Self-pay | Admitting: Gynecologic Oncology

## 2014-04-25 ENCOUNTER — Ambulatory Visit: Payer: BC Managed Care – PPO | Attending: Gynecologic Oncology | Admitting: Gynecologic Oncology

## 2014-04-25 VITALS — BP 138/88 | HR 90 | Temp 98.3°F | Resp 20 | Ht 65.0 in | Wt 252.7 lb

## 2014-04-25 DIAGNOSIS — D270 Benign neoplasm of right ovary: Secondary | ICD-10-CM | POA: Diagnosis not present

## 2014-04-25 DIAGNOSIS — N8320 Unspecified ovarian cysts: Secondary | ICD-10-CM | POA: Diagnosis present

## 2014-04-25 DIAGNOSIS — Z7189 Other specified counseling: Secondary | ICD-10-CM | POA: Diagnosis not present

## 2014-04-25 DIAGNOSIS — N83201 Unspecified ovarian cyst, right side: Secondary | ICD-10-CM

## 2014-04-25 DIAGNOSIS — Z483 Aftercare following surgery for neoplasm: Secondary | ICD-10-CM

## 2014-04-25 MED ORDER — IBUPROFEN 800 MG PO TABS: 800.0000 mg | ORAL_TABLET | Freq: Three times a day (TID) | ORAL | Status: DC | PRN

## 2014-04-25 NOTE — Progress Notes (Signed)
HPI:  Mary [name] is a 53 y.o. year old [id number] initially seen in consultation on 03/10/14 (referred by Dr Phineas Real) for a right ovarian cyst.  She then underwent a laparoscopic right salpingo-oophorectomy and lysis of adhesions on 28/3/66  without complications.  A colpotomy was created during the procedure in order to facilitate resection of the cyst from the vaginal cuff. It was repaired with 0-vicryl. Her postoperative course was uncomplicated other than severe reaction to tape application on the forearms that resulted in desquamation.  Her final pathology revealed a benign serous cystademoa.  She is seen today for a postoperative check and to discuss her pathology results and ongoing plan.  Since discharge from the hospital, she is feeling well.  She has improving appetite, normal bowel and bladder function, and pain controlled with minimal PO medication. She has no other complaints today.    Review of systems: Constitutional:  She has no weight gain or weight loss. She has no fever or chills. Eyes: No blurred vision Ears, Nose, Mouth, Throat: No dizziness, headaches or changes in hearing. No mouth sores. Cardiovascular: No chest pain, palpitations or edema. Respiratory:  No shortness of breath, wheezing or cough Gastrointestinal: She has normal bowel movements without diarrhea or constipation. She denies any nausea or vomiting. She denies blood in her stool or heart burn. Genitourinary:  She denies pelvic pain, pelvic pressure or changes in her urinary function. She has no hematuria, dysuria, or incontinence. She has no irregular vaginal bleeding or vaginal discharge Musculoskeletal: Denies muscle weakness or joint pains.  Skin:  She has no skin changes, rashes or itching Neurological:  Denies dizziness or headaches. No neuropathy, no numbness or tingling. Psychiatric:  She denies depression or anxiety. Hematologic/Lymphatic:   No easy bruising or bleeding   Physical Exam: Blood  pressure 138/88, pulse 90, temperature 98.3 F (36.8 C), temperature source Oral, resp. rate 20, height 5\' 5"  (1.651 m), weight 252 lb 11.2 oz (114.624 kg). General: Well dressed, well nourished in no apparent distress.   HEENT:  Normocephalic and atraumatic, no lesions.  Extraocular muscles intact. Sclerae anicteric. Pupils equal, round, reactive. No mouth sores or ulcers. Thyroid is normal size, not nodular, midline. Skin:  No lesions or rashes. Breasts:  Soft, symmetric.  No skin or nipple changes.  No palpable LN or masses. Lungs:  Clear to auscultation bilaterally.  No wheezes. Cardiovascular:  Regular rate and rhythm.  No murmurs or rubs. Abdomen:  Soft, nontender, nondistended.  No palpable masses.  No hepatosplenomegaly.  No ascites. Normal bowel sounds.  No hernias.  Incisions are healing normal. Genitourinary: Normal EGBUS  Vaginal cuff intact.  No bleeding or discharge.  Sutures palpable. No cul de sac fullness. Extremities: No cyanosis, clubbing or edema.  No calf tenderness or erythema. No palpable cords. Psychiatric: Mood and affect are appropriate. Neurological: Awake, alert and oriented x 3. Sensation is intact, no neuropathy.  Musculoskeletal: No pain, normal strength and range of motion.  Assessment:    53 y.o. year old with a benign serous cystadenoma.   S/p laparoscopic RSO on 04/17/14.   Plan: 1) Pathology reports reviewed today 2) Treatment counseling - I discussed that she requires no further followup for her ovarian cystectomy. I discussed care options for her desquamated left forearm (neosporin and telfa dressings). She should abstain from intercourse for an additional 5 weeks given the incision that was made at the vaginal cuff. She was given the opportunity to ask questions, which were answered to her satisfaction,  and she is agreement with the above mentioned plan of care.  3)  Return to clinic prn basis  Donaciano Eva, MD

## 2014-04-25 NOTE — Patient Instructions (Signed)
Please call for any questions or concerns and follow up with your referring provider.

## 2014-05-07 ENCOUNTER — Telehealth: Payer: Self-pay | Admitting: *Deleted

## 2014-05-07 NOTE — Telephone Encounter (Signed)
Pt called with request for letter to return to work with restriction to work part time Oct 29-Nov2. Resuming full time November 3 . Letter faxed to [employer] Savin per pt request to Fax# [phone]

## 2014-05-12 ENCOUNTER — Encounter: Payer: Self-pay | Admitting: Gynecologic Oncology

## 2014-06-20 DIAGNOSIS — E7800 Pure hypercholesterolemia, unspecified: Secondary | ICD-10-CM | POA: Insufficient documentation

## 2014-06-20 DIAGNOSIS — I10 Essential (primary) hypertension: Secondary | ICD-10-CM | POA: Insufficient documentation

## 2014-06-24 ENCOUNTER — Ambulatory Visit: Payer: Self-pay | Admitting: Internal Medicine

## 2014-07-11 DIAGNOSIS — J189 Pneumonia, unspecified organism: Secondary | ICD-10-CM

## 2014-07-11 HISTORY — DX: Pneumonia, unspecified organism: J18.9

## 2014-09-29 ENCOUNTER — Ambulatory Visit: Payer: Self-pay | Admitting: Internal Medicine

## 2014-10-16 ENCOUNTER — Ambulatory Visit
Admit: 2014-10-16 | Disposition: A | Discharge status: Home / Self Care | Payer: Self-pay | Attending: Neurosurgery | Admitting: Neurosurgery

## 2014-12-28 IMAGING — MG MM CAD SCREENING MAMMO
1 series · 4 of 4 positions shown · non-contrast
Comparison: none

REASON FOR EXAM: SCR MAMMO NO ORDER
COMMENTS:

[R CC · right · 4 of 4 slices shown]
[im 1/4]
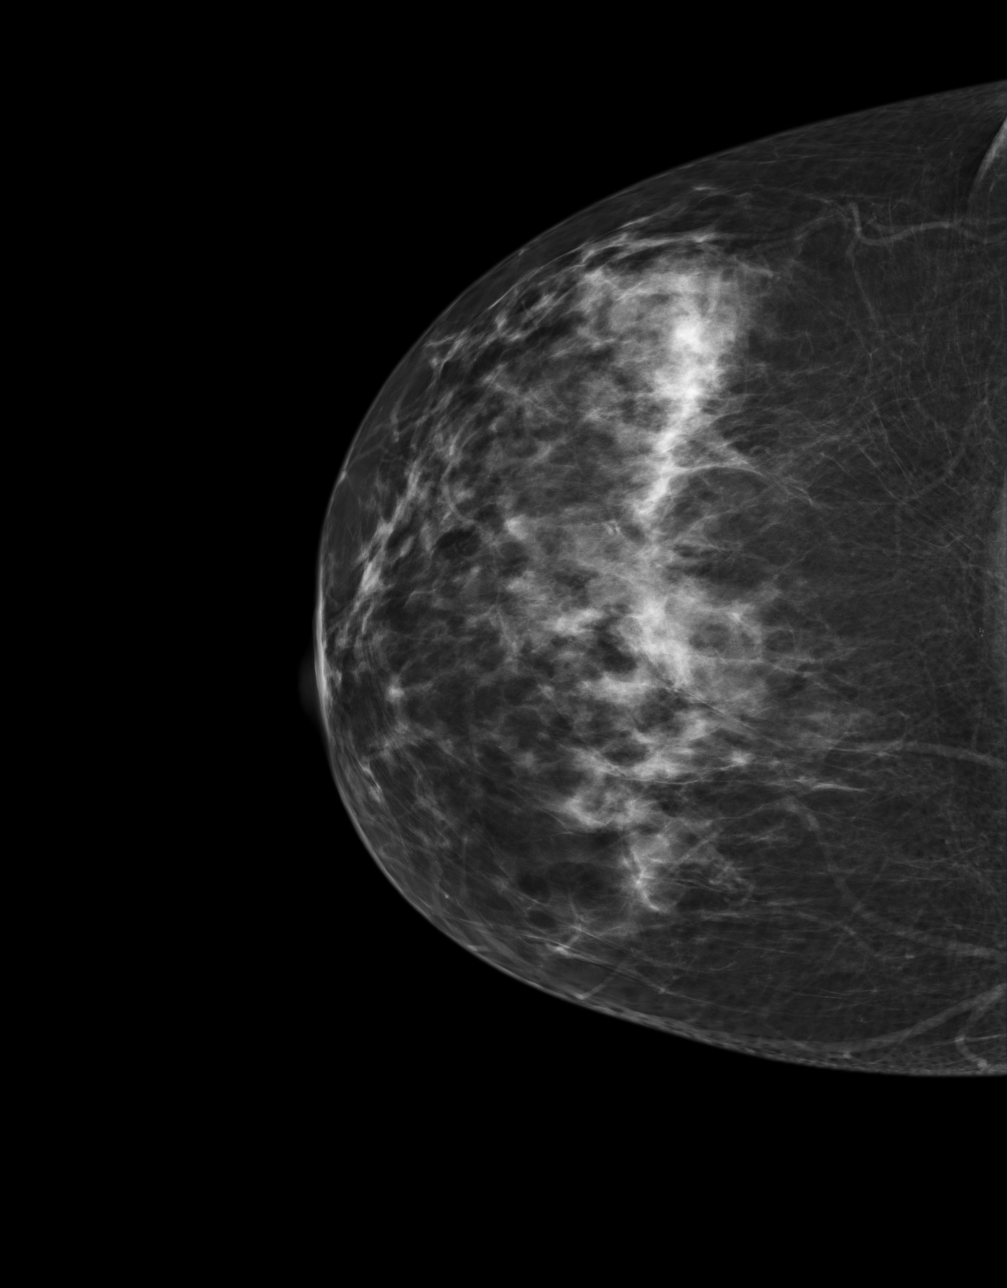
[im 2/4]
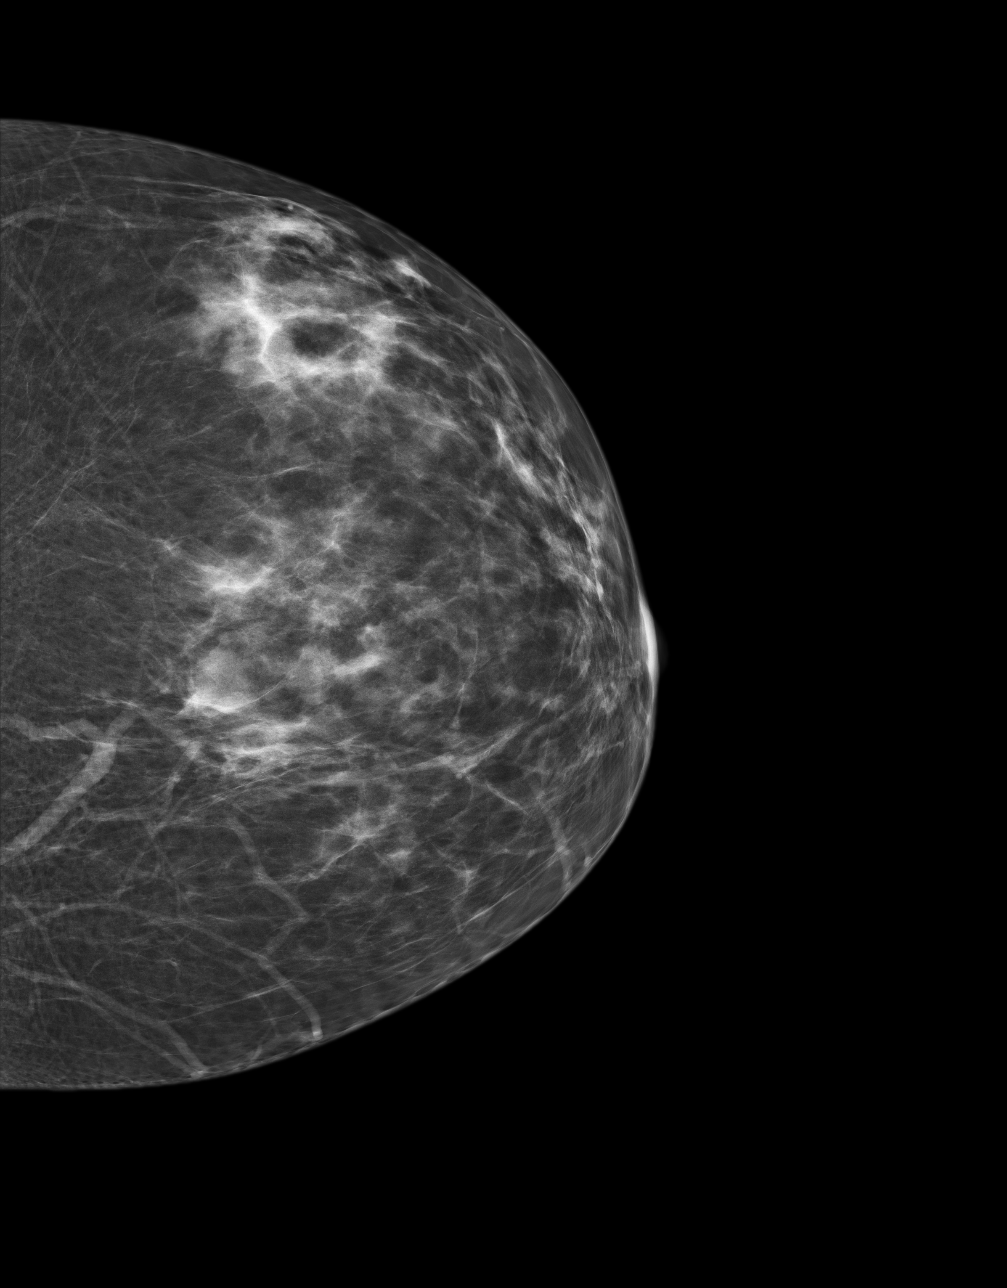
[im 3/4]
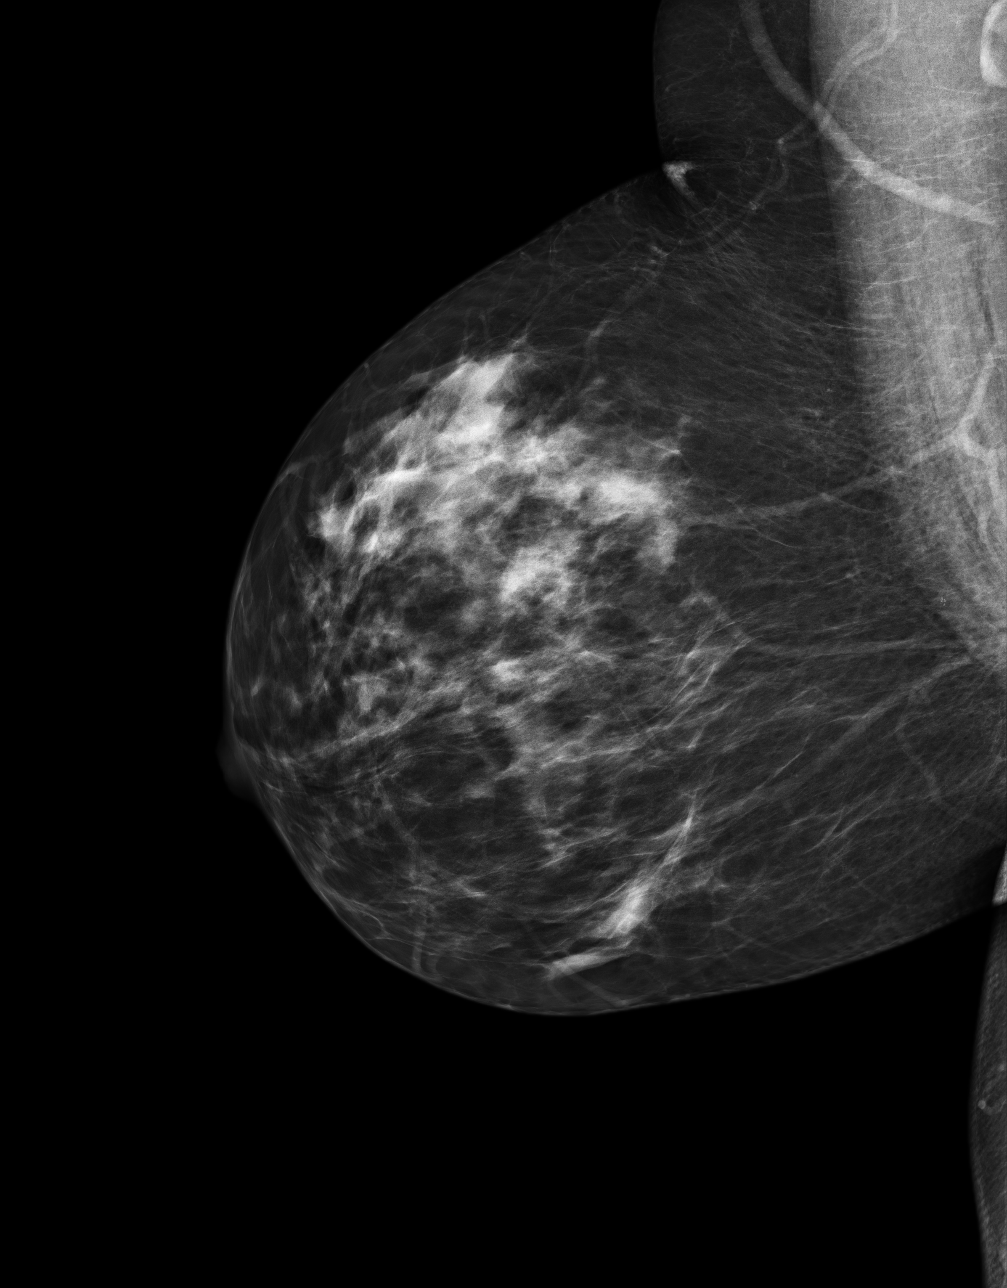
[im 4/4]
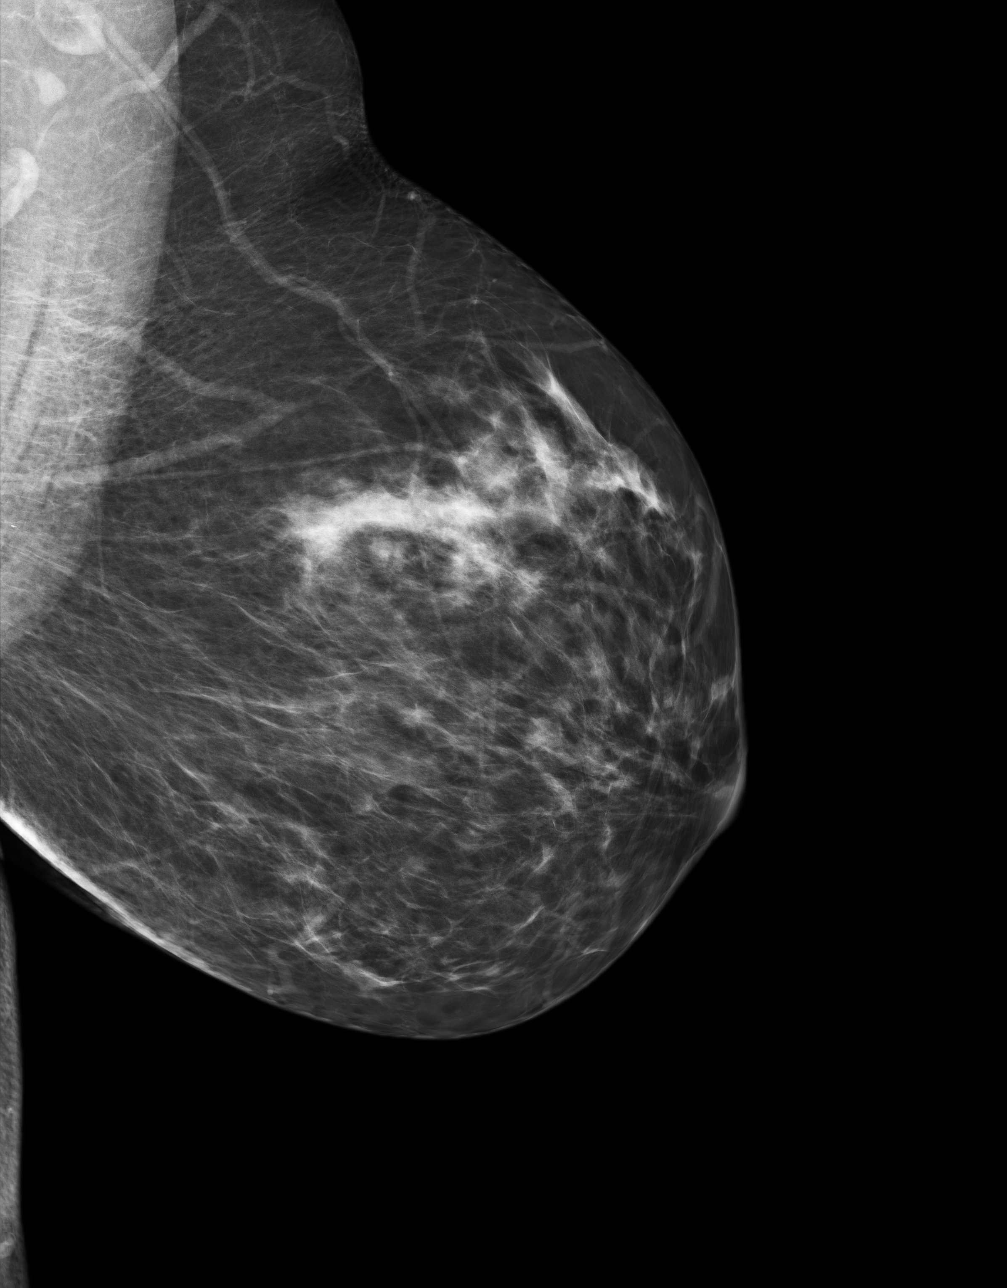

[4 of 4 positions shown; findings below may reference images not displayed]

PROCEDURE:     MAM - MAM DGTL SCRN MAM NO ORDER W/CAD  - January 09, 2013  [DATE]

RESULT:     Comparison is made to previous digital studies December 14, 2011,September 07, 2010, and August 26, 2008.

The breasts exhibit a scattered fibroglandular pattern. There are
benign-appearing lymph nodes in the axillary regions. There is no dominant
mass. There are no malignant appearing groupings of microcalcification.
There are no areas of new architectural distortion. A few loosely grouped
and scattered microcalcifications are present in the right breast
IMPRESSION: There are no findings suspicious for malignancy.

BI-RADS 2: Benign findings.

Recommendation: Please continue to encourage yearly mammographic followup.

BREAST COMPOSITION: The breast composition is SCATTERED FIBROGLANDULAR
TISSUE (glandular tissue is 25-50%)

A NEGATIVE MAMMOGRAM REPORT DOES NOT PRECLUDE BIOPSY OR OTHER EVALUATION OF
A CLINICALLY PALPABLE OR OTHERWISE SUSPICIOUS MASS OR LESION. BREAST CANCER
MAY NOT BE DETECTED BY MAMMOGRAPHY IN UP TO 10% OF CASES.

[REDACTED]

## 2015-02-12 ENCOUNTER — Other Ambulatory Visit: Payer: Self-pay | Admitting: Physician Assistant

## 2015-05-30 ENCOUNTER — Other Ambulatory Visit: Payer: Self-pay | Admitting: Physician Assistant

## 2015-07-08 ENCOUNTER — Telehealth: Payer: Self-pay

## 2015-07-08 NOTE — Telephone Encounter (Signed)
i told [facility] through fax-aa

## 2015-07-08 NOTE — Telephone Encounter (Signed)
She is a [employer] patient. This needs to be faxed to [phone] as I do not prescribe their medications any longer.  Thanks!

## 2015-07-08 NOTE — Telephone Encounter (Signed)
[facility], faxed over request to change Paroxetine to 90 day supply for the patient. Please re send that way if you approve thank you-aa

## 2015-09-07 ENCOUNTER — Other Ambulatory Visit: Payer: Self-pay | Admitting: Internal Medicine

## 2015-09-07 DIAGNOSIS — Z1231 Encounter for screening mammogram for malignant neoplasm of breast: Secondary | ICD-10-CM

## 2015-10-07 ENCOUNTER — Ambulatory Visit
Admission: RE | Admit: 2015-10-07 | Discharge: 2015-10-07 | Disposition: A | Discharge status: Home / Self Care | Payer: BLUE CROSS/BLUE SHIELD | Source: Ambulatory Visit | Attending: Internal Medicine | Admitting: Internal Medicine

## 2015-10-07 DIAGNOSIS — Z1231 Encounter for screening mammogram for malignant neoplasm of breast: Secondary | ICD-10-CM | POA: Insufficient documentation

## 2015-11-02 ENCOUNTER — Ambulatory Visit
Admission: RE | Admit: 2015-11-02 | Discharge: 2015-11-02 | Disposition: A | Discharge status: Home / Self Care | Payer: BLUE CROSS/BLUE SHIELD | Source: Ambulatory Visit | Attending: Family Medicine | Admitting: Family Medicine

## 2015-11-02 ENCOUNTER — Other Ambulatory Visit: Payer: Self-pay | Admitting: Family Medicine

## 2015-11-02 DIAGNOSIS — R600 Localized edema: Secondary | ICD-10-CM | POA: Diagnosis present

## 2015-11-12 ENCOUNTER — Ambulatory Visit: Payer: BLUE CROSS/BLUE SHIELD | Admitting: Anesthesiology

## 2015-11-12 ENCOUNTER — Encounter
Admission: RE | Disposition: A | Discharge status: Home / Self Care | Payer: Self-pay | Source: Ambulatory Visit | Attending: General Surgery

## 2015-11-12 ENCOUNTER — Encounter: Payer: Self-pay | Admitting: *Deleted

## 2015-11-12 ENCOUNTER — Ambulatory Visit
Admission: RE | Admit: 2015-11-12 | Discharge: 2015-11-12 | Disposition: A | Discharge status: Home / Self Care | Payer: BLUE CROSS/BLUE SHIELD | Source: Ambulatory Visit | Attending: General Surgery | Admitting: General Surgery

## 2015-11-12 ENCOUNTER — Ambulatory Visit (INDEPENDENT_AMBULATORY_CARE_PROVIDER_SITE_OTHER): Payer: BLUE CROSS/BLUE SHIELD | Admitting: General Surgery

## 2015-11-12 ENCOUNTER — Encounter: Payer: Self-pay | Admitting: General Surgery

## 2015-11-12 ENCOUNTER — Ambulatory Visit: Payer: Self-pay

## 2015-11-12 VITALS — BP 142/74 | HR 64 | Temp 97.8°F | Resp 12 | Ht 66.0 in | Wt 253.0 lb

## 2015-11-12 DIAGNOSIS — Z87891 Personal history of nicotine dependence: Secondary | ICD-10-CM | POA: Diagnosis not present

## 2015-11-12 DIAGNOSIS — G4730 Sleep apnea, unspecified: Secondary | ICD-10-CM | POA: Diagnosis not present

## 2015-11-12 DIAGNOSIS — E039 Hypothyroidism, unspecified: Secondary | ICD-10-CM | POA: Diagnosis not present

## 2015-11-12 DIAGNOSIS — R011 Cardiac murmur, unspecified: Secondary | ICD-10-CM | POA: Insufficient documentation

## 2015-11-12 DIAGNOSIS — S8011XD Contusion of right lower leg, subsequent encounter: Secondary | ICD-10-CM

## 2015-11-12 DIAGNOSIS — S8011XA Contusion of right lower leg, initial encounter: Secondary | ICD-10-CM

## 2015-11-12 DIAGNOSIS — F419 Anxiety disorder, unspecified: Secondary | ICD-10-CM | POA: Insufficient documentation

## 2015-11-12 DIAGNOSIS — I739 Peripheral vascular disease, unspecified: Secondary | ICD-10-CM | POA: Diagnosis not present

## 2015-11-12 DIAGNOSIS — M1990 Unspecified osteoarthritis, unspecified site: Secondary | ICD-10-CM | POA: Insufficient documentation

## 2015-11-12 DIAGNOSIS — S8010XA Contusion of unspecified lower leg, initial encounter: Secondary | ICD-10-CM | POA: Insufficient documentation

## 2015-11-12 HISTORY — PX: IRRIGATION AND DEBRIDEMENT HEMATOMA: SHX5254

## 2015-11-12 LAB — CBC WITH DIFFERENTIAL/PLATELET
Basophils Absolute: 0 10*3/uL (ref 0–0.1)
Basophils Relative: 1 %
Eosinophils Absolute: 0.1 10*3/uL (ref 0–0.7)
Eosinophils Relative: 2 %
HCT: 39.9 % (ref 35.0–47.0)
Hemoglobin: 13.5 g/dL (ref 12.0–16.0)
Lymphocytes Relative: 31 %
Lymphs Abs: 2.2 10*3/uL (ref 1.0–3.6)
MCH: 29.8 pg (ref 26.0–34.0)
MCHC: 33.7 g/dL (ref 32.0–36.0)
MCV: 88.4 fL (ref 80.0–100.0)
Monocytes Absolute: 0.5 10*3/uL (ref 0.2–0.9)
Monocytes Relative: 7 %
Neutro Abs: 4.2 10*3/uL (ref 1.4–6.5)
Neutrophils Relative %: 59 %
Platelets: 192 10*3/uL (ref 150–440)
RBC: 4.52 MIL/uL (ref 3.80–5.20)
RDW: 14.3 % (ref 11.5–14.5)
WBC: 7 10*3/uL (ref 3.6–11.0)

## 2015-11-12 SURGERY — IRRIGATION AND DEBRIDEMENT HEMATOMA
Anesthesia: General | Laterality: Right

## 2015-11-12 MED ORDER — CEFAZOLIN SODIUM-DEXTROSE 2-4 GM/100ML-% IV SOLN
2.0000 g | INTRAVENOUS | Status: AC
  Administered 2015-11-12: 2 g via INTRAVENOUS

## 2015-11-12 MED ORDER — ACETAMINOPHEN 10 MG/ML IV SOLN
INTRAVENOUS | Status: AC
  Filled 2015-11-12: qty 100

## 2015-11-12 MED ORDER — ONDANSETRON HCL 4 MG/2ML IJ SOLN
INTRAMUSCULAR | Status: DC | PRN
  Administered 2015-11-12: 4 mg via INTRAVENOUS

## 2015-11-12 MED ORDER — ACETAMINOPHEN 10 MG/ML IV SOLN
1000.0000 mg | Freq: Once | INTRAVENOUS | Status: AC
  Administered 2015-11-12: 1000 mg via INTRAVENOUS

## 2015-11-12 MED ORDER — NEOSTIGMINE METHYLSULFATE 10 MG/10ML IV SOLN
INTRAVENOUS | Status: DC | PRN
  Administered 2015-11-12: 3 mg via INTRAVENOUS

## 2015-11-12 MED ORDER — ROCURONIUM BROMIDE 100 MG/10ML IV SOLN
INTRAVENOUS | Status: DC | PRN
  Administered 2015-11-12: 5 mg via INTRAVENOUS
  Administered 2015-11-12: 25 mg via INTRAVENOUS

## 2015-11-12 MED ORDER — KETOROLAC TROMETHAMINE 30 MG/ML IJ SOLN
INTRAMUSCULAR | Status: AC
  Administered 2015-11-12: 60 mg
  Filled 2015-11-12: qty 2

## 2015-11-12 MED ORDER — FENTANYL CITRATE (PF) 100 MCG/2ML IJ SOLN
25.0000 ug | INTRAMUSCULAR | Status: DC | PRN
  Administered 2015-11-12 (×4): 25 ug via INTRAVENOUS

## 2015-11-12 MED ORDER — GLYCOPYRROLATE 0.2 MG/ML IJ SOLN
INTRAMUSCULAR | Status: DC | PRN
  Administered 2015-11-12: .6 mg via INTRAVENOUS

## 2015-11-12 MED ORDER — CEFAZOLIN SODIUM-DEXTROSE 2-3 GM-% IV SOLR
INTRAVENOUS | Status: DC | PRN
  Administered 2015-11-12: 2 g via INTRAVENOUS

## 2015-11-12 MED ORDER — SUCCINYLCHOLINE CHLORIDE 20 MG/ML IJ SOLN
INTRAMUSCULAR | Status: DC | PRN
  Administered 2015-11-12: 120 mg via INTRAVENOUS

## 2015-11-12 MED ORDER — OXYCODONE-ACETAMINOPHEN 5-325 MG PO TABS: 1.0000 | ORAL_TABLET | ORAL | Status: DC | PRN

## 2015-11-12 MED ORDER — KETOROLAC TROMETHAMINE 60 MG/2ML IM SOLN: 60.0000 mg | Freq: Once | INTRAMUSCULAR | Status: DC

## 2015-11-12 MED ORDER — LIDOCAINE HCL (CARDIAC) 20 MG/ML IV SOLN
INTRAVENOUS | Status: DC | PRN
  Administered 2015-11-12: 30 mg via INTRAVENOUS

## 2015-11-12 MED ORDER — LACTATED RINGERS IV SOLN
INTRAVENOUS | Status: DC
  Administered 2015-11-12: 100 mL/h via INTRAVENOUS
  Administered 2015-11-12: 13:00:00 via INTRAVENOUS

## 2015-11-12 MED ORDER — FENTANYL CITRATE (PF) 100 MCG/2ML IJ SOLN
INTRAMUSCULAR | Status: AC
  Filled 2015-11-12: qty 2

## 2015-11-12 MED ORDER — ONDANSETRON HCL 4 MG/2ML IJ SOLN: 4.0000 mg | Freq: Once | INTRAMUSCULAR | Status: DC | PRN

## 2015-11-12 MED ORDER — PROPOFOL 10 MG/ML IV BOLUS
INTRAVENOUS | Status: DC | PRN
  Administered 2015-11-12: 150 mg via INTRAVENOUS

## 2015-11-12 SURGICAL SUPPLY — 27 items
BLADE SURG 15 STRL SS SAFETY (BLADE) ×2 IMPLANT
BNDG COHESIVE 4X5 TAN STRL (GAUZE/BANDAGES/DRESSINGS) ×2 IMPLANT
CANISTER SUCT 1200ML W/VALVE (MISCELLANEOUS) ×2 IMPLANT
CHLORAPREP W/TINT 26ML (MISCELLANEOUS) ×2 IMPLANT
DRAPE LAPAROTOMY 100X77 ABD (DRAPES) ×2 IMPLANT
DRAPE SHEET LG 3/4 BI-LAMINATE (DRAPES) ×2 IMPLANT
DRESSING TELFA 4X3 1S ST N-ADH (GAUZE/BANDAGES/DRESSINGS) ×4 IMPLANT
ELECT REM PT RETURN 9FT ADLT (ELECTROSURGICAL) ×2
ELECTRODE REM PT RTRN 9FT ADLT (ELECTROSURGICAL) ×1 IMPLANT
GAUZE FLUFF 18X24 1PLY STRL (GAUZE/BANDAGES/DRESSINGS) ×2 IMPLANT
GLOVE BIO SURGEON STRL SZ7.5 (GLOVE) ×2 IMPLANT
GLOVE INDICATOR 8.0 STRL GRN (GLOVE) ×2 IMPLANT
GOWN STRL REUS W/ TWL LRG LVL3 (GOWN DISPOSABLE) ×2 IMPLANT
GOWN STRL REUS W/TWL LRG LVL3 (GOWN DISPOSABLE) ×2
KIT RM TURNOVER STRD PROC AR (KITS) ×2 IMPLANT
LABEL OR SOLS (LABEL) ×2 IMPLANT
NDL SAFETY 22GX1.5 (NEEDLE) ×2 IMPLANT
NEEDLE HYPO 25X1 1.5 SAFETY (NEEDLE) ×2 IMPLANT
PACK BASIN MINOR ARMC (MISCELLANEOUS) ×2 IMPLANT
PADDING CAST 4IN STRL (MISCELLANEOUS) ×1
PADDING CAST BLEND 4X4 STRL (MISCELLANEOUS) ×1 IMPLANT
STOCKINETTE IMPERV 14X48 (MISCELLANEOUS) ×2 IMPLANT
STRIP CLOSURE SKIN 1/2X4 (GAUZE/BANDAGES/DRESSINGS) ×2 IMPLANT
SUT VIC AB 3-0 SH 27 (SUTURE) ×2
SUT VIC AB 3-0 SH 27X BRD (SUTURE) ×2 IMPLANT
SWABSTK COMLB BENZOIN TINCTURE (MISCELLANEOUS) ×2 IMPLANT
SYR CONTROL 10ML (SYRINGE) ×2 IMPLANT

## 2015-11-12 NOTE — Anesthesia Procedure Notes (Signed)
Procedure Name: Intubation Date/Time: 11/12/2015 12:52 PM Performed by: Courtney Paris Pre-anesthesia Checklist: Patient identified, Emergency Drugs available, Suction available, Patient being monitored and Timeout performed Patient Re-evaluated:Patient Re-evaluated prior to inductionOxygen Delivery Method: Circle system utilized Preoxygenation: Pre-oxygenation with 100% oxygen Intubation Type: IV induction and Combination inhalational/ intravenous induction Ventilation: Mask ventilation without difficulty Laryngoscope Size: McGraph and 4 Grade View: Grade III Tube type: Oral Tube size: 7.0 mm Number of attempts: 1 Airway Equipment and Method: Stylet Placement Confirmation: ETT inserted through vocal cords under direct vision,  positive ETCO2,  CO2 detector and breath sounds checked- equal and bilateral Secured at: 20 cm Tube secured with: Tape Dental Injury: Teeth and Oropharynx as per pre-operative assessment  Difficulty Due To: Difficulty was anticipated, Difficult Airway- due to reduced neck mobility, Difficult Airway- due to limited oral opening and Difficult Airway- due to dentition Future Recommendations: Recommend- induction with short-acting agent, and alternative techniques readily available

## 2015-11-12 NOTE — Transfer of Care (Signed)
Immediate Anesthesia Transfer of Care Note  Patient: Mary [name]  Procedure(s) Performed: Procedure(s): IRRIGATION AND DEBRIDEMENT HEMATOMA (Right)  Patient Location: PACU  Anesthesia Type:General  Level of Consciousness: awake, oriented and patient cooperative  Airway & Oxygen Therapy: Patient Spontanous Breathing and Patient connected to face mask oxygen  Post-op Assessment: Report given to RN and Post -op Vital signs reviewed and stable  Post vital signs: Reviewed and stable  Last Vitals:  Filed Vitals:   11/12/15 1146 11/12/15 1326  BP: 147/99 123/76  Pulse: 65 70  Temp: 36.8 C 36.3 C  Resp: 16 11    Last Pain:  Filed Vitals:   11/12/15 1328  PainSc: 8       Patients Stated Pain Goal: 0 ([id number])  Complications: No apparent anesthesia complications

## 2015-11-12 NOTE — Op Note (Signed)
Preoperative diagnosis: Hematoma right posterior calf.  Postoperative diagnosis: Same.  Operative procedure: Incision and drainage right posterior calf hematoma.  Operating surgeon: Ollen Bowl, M.D.  Anesthesia: Gen. endotracheal.  Estimated blood loss: None.  Clinical note: This 55 year old woman suffered an injury to the right posterior calf 12 days ago. She has had increasing pain and showed evidence of a warm 8-10 cm area on the calf consistent with a liquefying hematoma. Incision and drainage has been recommended to accelerate healing.  Operative note: The patient received prior to the procedure. She underwent general endotracheal anesthesia. She was placed into the left lateral decubitus position and supported on a beanbag. Axillary roll utilized. Adequate padding between the legs obtained. ChloraPrep applied to the calf. The leg was draped. A small Tuesday 0.5 cm incision was made at the base of the fluctuant area and the suction catheter used to evacuate the hematoma. The cavity was approximately 10 cm in diameter. Finger dissection broke up a few loculations. Limited inspection through this small hole showed healthy tissue below. The cavity was irrigated with saline solution. A 1 inch Penrose drain was placed into the wound and anchored with a trans-cutaneous stitch superiorly. Telfa, fluffed gauze, Webril  and Coban dressing applied.  Patient tolerated the procedure well and was taken to recovery in stable condition.

## 2015-11-12 NOTE — OR Nursing (Signed)
Dr Bary Castilla given patient update per phone. No new orders.  Patient pain level has improved, patient denies any need for more pain meds at this time.  Patient desires discharge.

## 2015-11-12 NOTE — Discharge Instructions (Signed)

## 2015-11-12 NOTE — Anesthesia Postprocedure Evaluation (Signed)
Anesthesia Post Note  Patient: Mary [name]  Procedure(s) Performed: Procedure(s) (LRB): IRRIGATION AND DEBRIDEMENT HEMATOMA (Right)  Patient location during evaluation: PACU Anesthesia Type: General Level of consciousness: awake and alert Pain management: pain level controlled Vital Signs Assessment: post-procedure vital signs reviewed and stable Respiratory status: spontaneous breathing and respiratory function stable Cardiovascular status: stable Anesthetic complications: no    Last Vitals:  Filed Vitals:   11/12/15 1439 11/12/15 1500  BP: 161/79 145/70  Pulse: 104 53  Temp: 36.5 C   Resp: 16 16    Last Pain:  Filed Vitals:   11/12/15 1505  PainSc: 5                  Hassell Patras K

## 2015-11-12 NOTE — Progress Notes (Signed)
Patient ID: Mary [name], female   DOB: June 26, 1961, 55 y.o.   MRN: [id number]  Chief Complaint  Patient presents with  . Abscess    right calf    HPI Mary [name] is a 55 y.o. female here today for an evaluation of an abscess located on her posterior right calf. The area first started 2 weeks ago after an injury on 10-31-15. She and her husband were on a porch swing and the chain broke. Her ankle was dorsiflexed as she was going to sit and the seat of the swing struck her in the upper proximal posterior calf. The area bruised and turned red immediately. The bruising has improved but the pain has gotten worse over the past few days.   She has had an ultrasound 11-02-15 that showed no clots.  The over all swelling has improved.  She has been using ice, compression and elevation. The patient normally makes use of a knee-high compressive garment for lower extremity swelling. No previous history of DVT.   I personally reviewed the patient's history.  HPI  Past Medical History  Diagnosis Date  . Palpitations   . Hyperlipidemia   . PAC (premature atrial contraction)   . Hypothyroidism   . Pneumonia     "walking pneumonia"  . Shortness of breath     going upstairs (due to weight)  . Sleep apnea     after weight loss does not have to use cpap  . Peripheral vascular disease ([place])     "small" clot after thermal ablasion  . Arthritis   . Anemia     low iron at times  . Lactose intolerance   . Complication of anesthesia     Pt reports she awoke during plantar fascia surgery and felt "everything"  . Panic attacks     Pt reports restrictive clothing/areas and dry mouth cause her to have attack.  Marland Kitchen Heart murmur     " small per patient"     Past Surgical History  Procedure Laterality Date  . 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 Plantar fascia surgery  1999  . Cesarean section  1987    twins  . Appendectomy  1985  . Colonoscopy    . Laminectomy  07/10/2013    L4   L5   DR Ronnald Ramp   . Maximum access (mas)posterior lumbar interbody fusion (plif) 1 level  07/10/2013    Procedure: FOR MAXIMUM ACCESS (MAS) POSTERIOR LUMBAR INTERBODY FUSION (PLIF) LUMBAR FOUR-FIVE;  Surgeon: Eustace Moore, MD;  Location: [facility];  Service: Neurosurgery;;  FOR MAXIMUM ACCESS (MAS) POSTERIOR LUMBAR INTERBODY FUSION (PLIF) LUMBAR FOUR-FIVE  . Oophorectomy      LSO  . Vaginal hysterectomy  2001    for bleeding and LSO for cyst  . Laparoscopic salpingo oopherectomy Right 04/17/2014    Procedure: LAPAROSCOPIC SALPINGO OOPHORECTOMY RIGHT;  Surgeon: Everitt Amber, MD;  Location: [facility];  Service: Gynecology;  Laterality: Right;  . Lysis of adhesion  04/17/2014    Procedure: LYSIS OF ADHESION;  Surgeon: Everitt Amber, MD;  Location: [facility];  Service: Gynecology;;    Family History  Problem Relation Age of Onset  . Heart disease Mother   . Lung cancer Mother   . Diabetes Mother     later in life  . Coronary artery disease Mother   . Heart disease Father   . Arrhythmia Sister   . Breast cancer Neg Hx     Social History Social History  Substance Use Topics  . Smoking status: Former Smoker -- 0.30 packs/day for 4 years    Quit date: 07/11/2002  . Smokeless tobacco: Never Used  . Alcohol Use: 0.5 oz/week    1 drink(s) per week     Comment: occasional beer and wine    Allergies  Allergen Reactions  . Citalopram Nausea Only  . Tape Other (See Comments)    Thin skin    Current Outpatient Prescriptions  Medication Sig Dispense Refill  . EPINEPHrine (EPIPEN 2-PAK) 0.3 mg/0.3 mL IJ SOAJ injection 0.3 mg/0.3 mL  prn as needed    . HYDROcodone-acetaminophen (NORCO/VICODIN) 5-325 MG per tablet     . ibuprofen (ADVIL,MOTRIN) 800 MG tablet Take 1 tablet (800 mg total) by mouth every 8 (eight) hours as needed. 30 tablet 0  . levothyroxine (SYNTHROID, LEVOTHROID) 100 MCG tablet Take 100 mcg by mouth daily before breakfast.    . PARoxetine (PAXIL) 10 MG tablet TAKE 1 TABLET BY MOUTH EVERY DAY AT  BEDTIME 30 tablet 6  . traMADol (ULTRAM) 50 MG tablet Take by mouth every 6 (six) hours as needed.    . traZODone (DESYREL) 50 MG tablet Take 50 mg by mouth at bedtime as needed for sleep.    . Vitamin D, Ergocalciferol, (DRISDOL) 50000 UNITS CAPS capsule Take 50,000 Units by mouth every Monday.    . zolpidem (AMBIEN) 10 MG tablet Take 10 mg by mouth at bedtime.     No current facility-administered medications for this visit.    Review of Systems Review of Systems  Constitutional: Negative.   Respiratory: Negative.   Cardiovascular: Negative.     Blood pressure 142/74, pulse 64, temperature 97.8 F (36.6 C), temperature source Oral, resp. rate 12, height 5\' 6"  (1.676 m), weight 253 lb (114.76 kg).  Physical Exam Physical Exam  Constitutional: She is oriented to person, place, and time. She appears well-developed and well-nourished.  HENT:  Mouth/Throat: Oropharynx is clear and moist.  Eyes: Conjunctivae are normal. No scleral icterus.  Neck: Neck supple.  Cardiovascular: Normal rate, regular rhythm and normal heart sounds.   Pulses:      Dorsalis pedis pulses are 2+ on the right side, and 2+ on the left side.       Posterior tibial pulses are 2+ on the right side, and 2+ on the left side.  Pulmonary/Chest: Effort normal and breath sounds normal.  Musculoskeletal:       Legs: Lymphadenopathy:    She has no cervical adenopathy.  Neurological: She is alert and oriented to person, place, and time.  Skin: Skin is warm and dry.  20 x 22 ecchymosis and 15 x 15 hematoma posterior right calf.  Psychiatric: Her behavior is normal.    Data Reviewed Lower extremity ultrasound dated 11/02/2015 was reviewed. IMPRESSION: Negative for right lower extremity DVT.  Right posterior calf superficial irregular hypoechoic area measuring 6.3 x 1.4 cm compatible with superficial bruising/ hematoma.   Ultrasound examination of the right posterior calf showed a large superficially located  hematoma measuring at a minimum 2 x 5 x 5.2 cm in diameter. The greater saphenous vein was patent. There is a cystic structure in the right popliteal space measuring 0.7 x 1.36 x 4 cm. This does not appear to be in continuity with the hematoma cavity distal to it nor does it appear to be in continuity with the lesser saphenous vein.  Assessment    Right posterior calf hematoma.    Plan    The  patient has had acceleration of pain in the area of the hematoma component. While the area is somewhat red and warm, I suspect this is not related to abscess formation. She'll benefit from incision and drainage. She is aware that this will result in a drain being placed and she will likely benefit of work for several days.  Patient's surgery has been scheduled for later today. She is aware to not have anything else to eat or drink.       PCP: Dr. Ramonita Lab Ref: Karen Kitchens, MD  This information has been scribed by Karie Fetch RN, BSN,BC.   Robert Bellow 11/12/2015, 10:25 AM

## 2015-11-12 NOTE — H&P (Signed)
HPI  Mary [name] is a 55 y.o. female here today for an evaluation of an abscess located on her posterior right calf. The area first started 2 weeks ago after an injury on 10-31-15. She and her husband were on a porch swing and the chain broke. Her ankle was dorsiflexed as she was going to sit and the seat of the swing struck her in the upper proximal posterior calf. The area bruised and turned red immediately. The bruising has improved but the pain has gotten worse over the past few days.  She has had an ultrasound 11-02-15 that showed no clots.  The over all swelling has improved.  She has been using ice, compression and elevation. The patient normally makes use of a knee-high compressive garment for lower extremity swelling. No previous history of DVT.  I personally reviewed the patient's history.  HPI  Past Medical History   Diagnosis  Date   .  Palpitations    .  Hyperlipidemia    .  PAC (premature atrial contraction)    .  Hypothyroidism    .  Pneumonia      "walking pneumonia"   .  Shortness of breath      going upstairs (due to weight)   .  Sleep apnea      after weight loss does not have to use cpap   .  Peripheral vascular disease ([place])      "small" clot after thermal ablasion   .  Arthritis    .  Anemia      low iron at times   .  Lactose intolerance    .  Complication of anesthesia      Pt reports she awoke during plantar fascia surgery and felt "everything"   .  Panic attacks      Pt reports restrictive clothing/areas and dry mouth cause her to have attack.   Marland Kitchen  Heart murmur      " small per patient"    Past Surgical History   Procedure  Laterality  Date   .  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  Plantar fascia surgery   1999   .  Cesarean section   1987     twins   .  Appendectomy   1985   .  Colonoscopy     .  Laminectomy   07/10/2013     L4 L5 DR Ronnald Ramp   .  Maximum access (mas)posterior lumbar interbody fusion (plif) 1 level   07/10/2013     Procedure: FOR  MAXIMUM ACCESS (MAS) POSTERIOR LUMBAR INTERBODY FUSION (PLIF) LUMBAR FOUR-FIVE; Surgeon: Eustace Moore, MD; Location: [facility]; Service: Neurosurgery;; FOR MAXIMUM ACCESS (MAS) POSTERIOR LUMBAR INTERBODY FUSION (PLIF) LUMBAR FOUR-FIVE   .  Oophorectomy       LSO   .  Vaginal hysterectomy   2001     for bleeding and LSO for cyst   .  Laparoscopic salpingo oopherectomy  Right  04/17/2014     Procedure: LAPAROSCOPIC SALPINGO OOPHORECTOMY RIGHT; Surgeon: Everitt Amber, MD; Location: [facility]; Service: Gynecology; Laterality: Right;   .  Lysis of adhesion   04/17/2014     Procedure: LYSIS OF ADHESION; Surgeon: Everitt Amber, MD; Location: [facility]; Service: Gynecology;;    Family History   Problem  Relation  Age of Onset   .  Heart disease  Mother    .  Lung cancer  Mother    .  Diabetes  Mother      later in life   .  Coronary artery disease  Mother    .  Heart disease  Father    .  Arrhythmia  Sister    .  Breast cancer  Neg Hx     Social History  Social History   Substance Use Topics   .  Smoking status:  Former Smoker -- 0.30 packs/day for 4 years     Quit date:  07/11/2002   .  Smokeless tobacco:  Never Used   .  Alcohol Use:  0.5 oz/week     1 drink(s) per week      Comment: occasional beer and wine    Allergies   Allergen  Reactions   .  Citalopram  Nausea Only   .  Tape  Other (See Comments)     Thin skin    Current Outpatient Prescriptions   Medication  Sig  Dispense  Refill   .  EPINEPHrine (EPIPEN 2-PAK) 0.3 mg/0.3 mL IJ SOAJ injection  0.3 mg/0.3 mL prn as needed     .  HYDROcodone-acetaminophen (NORCO/VICODIN) 5-325 MG per tablet      .  ibuprofen (ADVIL,MOTRIN) 800 MG tablet  Take 1 tablet (800 mg total) by mouth every 8 (eight) hours as needed.  30 tablet  0   .  levothyroxine (SYNTHROID, LEVOTHROID) 100 MCG tablet  Take 100 mcg by mouth daily before breakfast.     .  PARoxetine (PAXIL) 10 MG tablet  TAKE 1 TABLET BY MOUTH EVERY DAY AT BEDTIME  30 tablet  6   .  traMADol  (ULTRAM) 50 MG tablet  Take by mouth every 6 (six) hours as needed.     .  traZODone (DESYREL) 50 MG tablet  Take 50 mg by mouth at bedtime as needed for sleep.     .  Vitamin D, Ergocalciferol, (DRISDOL) 50000 UNITS CAPS capsule  Take 50,000 Units by mouth every Monday.     .  zolpidem (AMBIEN) 10 MG tablet  Take 10 mg by mouth at bedtime.      No current facility-administered medications for this visit.    Review of Systems  Review of Systems  Constitutional: Negative.  Respiratory: Negative.  Cardiovascular: Negative.   Blood pressure 142/74, pulse 64, temperature 97.8 F (36.6 C), temperature source Oral, resp. rate 12, height 5\' 6"  (1.676 m), weight 253 lb (114.76 kg).  Physical Exam  Physical Exam  Constitutional: She is oriented to person, place, and time. She appears well-developed and well-nourished.  HENT:  Mouth/Throat: Oropharynx is clear and moist.  Eyes: Conjunctivae are normal. No scleral icterus.  Neck: Neck supple.  Cardiovascular: Normal rate, regular rhythm and normal heart sounds.  Pulses:  Dorsalis pedis pulses are 2+ on the right side, and 2+ on the left side.  Posterior tibial pulses are 2+ on the right side, and 2+ on the left side.  Pulmonary/Chest: Effort normal and breath sounds normal.  Musculoskeletal:  Legs: Lymphadenopathy:  She has no cervical adenopathy.  Neurological: She is alert and oriented to person, place, and time.  Skin: Skin is warm and dry.  20 x 22 ecchymosis and 15 x 15 hematoma posterior right calf.  Psychiatric: Her behavior is normal.   Data Reviewed  Lower extremity ultrasound dated 11/02/2015 was reviewed.  IMPRESSION:  Negative for right lower extremity DVT.  Right posterior calf superficial irregular  hypoechoic area measuring 6.3 x 1.4 cm compatible with superficial  bruising/  hematoma.  Ultrasound examination of the right posterior calf showed a large superficially located hematoma measuring at a minimum 2 x 5 x 5.2 cm  in diameter. The greater saphenous vein was patent. There is a cystic structure in the right popliteal space measuring 0.7 x 1.36 x 4 cm. This does not appear to be in continuity with the hematoma cavity distal to it nor does it appear to be in continuity with the lesser saphenous vein.  Assessment   Right posterior calf hematoma.   Plan   The patient has had acceleration of pain in the area of the hematoma component. While the area is somewhat red and warm, I suspect this is not related to abscess formation. She'll benefit from incision and drainage. She is aware that this will result in a drain being placed and she will likely benefit of work for several days.  Patient's surgery has been scheduled for later today. She is aware to not have anything else to eat or drink.   PCP: Dr. Ramonita Lab  Ref: Karen Kitchens, MD  This information has been scribed by Karie Fetch RN, BSN,BC.  Robert Bellow  11/12/2015, 10:25 AM

## 2015-11-12 NOTE — OR Nursing (Signed)
Patient has large raised reddened area on right posterior side of calf.  Purple bruising beyond this site.  Size of sore area about the size of an orange.  Patient had shaved her leg at home earlier. Unable to tolerate any pressure on this site.  Pillow placed under right leg for comfort.  Also, Dr. Ronelle Nigh aware of patient drinking almost a cup of black coffee at 7 am and then chewing gum until arrival in [place].  He was okay with proceeding to surgery.

## 2015-11-12 NOTE — OR Nursing (Signed)
Patient states she has not used epi pen in years. Also , has not taken tramadol in months.

## 2015-11-12 NOTE — Brief Op Note (Signed)
11/12/2015  Preop/ post op:  Hematoma right calf. Procedure: I&D Surgeon: Arney Mayabb EBL: 0.

## 2015-11-12 NOTE — Patient Instructions (Signed)
Call office for any new  issues or concerns.

## 2015-11-12 NOTE — Anesthesia Preprocedure Evaluation (Addendum)
Anesthesia Evaluation  Patient identified by MRN, date of birth, ID band Patient awake    Reviewed: Allergy & Precautions, NPO status , Patient's Chart, lab work & pertinent test results  History of Anesthesia Complications (+) AWARENESS UNDER ANESTHESIA and history of anesthetic complications (with plantar facitis)  Airway Mallampati: II       Dental   Pulmonary sleep apnea (not using CPAP Ssince gasric bypass) , pneumonia (lasr april), resolved, former smoker,           Cardiovascular + Peripheral Vascular Disease  + Valvular Problems/Murmurs (murmur)      Neuro/Psych Anxiety    GI/Hepatic negative GI ROS, Neg liver ROS,   Endo/Other  Hypothyroidism   Renal/GU negative Renal ROS     Musculoskeletal  (+) Arthritis ,   Abdominal   Peds  Hematology  (+) anemia ,   Anesthesia Other Findings   Reproductive/Obstetrics                            Anesthesia Physical Anesthesia Plan  ASA: III  Anesthesia Plan: General   Post-op Pain Management:    Induction: Intravenous  Airway Management Planned: LMA and Oral ETT  Additional Equipment:   Intra-op Plan:   Post-operative Plan:   Informed Consent: I have reviewed the patients History and Physical, chart, labs and discussed the procedure including the risks, benefits and alternatives for the proposed anesthesia with the patient or authorized representative who has indicated his/her understanding and acceptance.     Plan Discussed with:   Anesthesia Plan Comments:         Anesthesia Quick Evaluation

## 2015-11-13 ENCOUNTER — Encounter: Payer: Self-pay | Admitting: General Surgery

## 2015-11-13 ENCOUNTER — Ambulatory Visit (INDEPENDENT_AMBULATORY_CARE_PROVIDER_SITE_OTHER): Payer: BLUE CROSS/BLUE SHIELD | Admitting: General Surgery

## 2015-11-13 VITALS — BP 140/80 | HR 76 | Resp 14 | Ht 66.0 in | Wt 253.0 lb

## 2015-11-13 DIAGNOSIS — S8011XA Contusion of right lower leg, initial encounter: Secondary | ICD-10-CM

## 2015-11-13 NOTE — Patient Instructions (Signed)
Keep leg elevated and bandage dry. Return Monday.

## 2015-11-13 NOTE — Progress Notes (Signed)
`Patient ID: Mary [name], female   DOB: [birth year], 55 y.o.   MRN: [id number]  Chief Complaint  Patient presents with  . Routine Post Op    HPI Mary [name] is a 55 y.o. female here for a post op visit for a right lower leg hematoma incision and drainage done on 11/12/15.The patient reports a marked decrease in pain since surgery.   HPI  Past Medical History  Diagnosis Date  . Palpitations   . Hyperlipidemia   . PAC (premature atrial contraction)   . Hypothyroidism   . Sleep apnea     after weight loss does not have to use cpap  . Peripheral vascular disease ([facility])     "small" clot after thermal ablasion  . Arthritis   . Anemia     low iron at times  . Lactose intolerance   . Panic attacks     Pt reports restrictive clothing/areas and dry mouth cause her to have attack.  Marland Kitchen Heart murmur     " small per patient"   . Complication of anesthesia 1991    Pt reports she awoke during plantar fascia surgery and felt "everything"  . Shortness of breath     going upstairs (due to weight). not so much since weight loss  . Pneumonia 2016    "walking pneumonia" 3 times in 2016    Past Surgical History  Procedure Laterality Date  . 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 Plantar fascia surgery  1999  . Cesarean section  1987    twins  . Appendectomy  1985  . Colonoscopy    . Laminectomy  07/10/2013    L4   L5   DR Yetta Barre  . Maximum access (mas)posterior lumbar interbody fusion (plif) 1 level  07/10/2013    Procedure: FOR MAXIMUM ACCESS (MAS) POSTERIOR LUMBAR INTERBODY FUSION (PLIF) LUMBAR FOUR-FIVE;  Surgeon: Tia Alert, MD;  Location: [facility];  Service: Neurosurgery;;  FOR MAXIMUM ACCESS (MAS) POSTERIOR LUMBAR INTERBODY FUSION (PLIF) LUMBAR FOUR-FIVE  . Oophorectomy      LSO  . Vaginal hysterectomy  2001    for bleeding and LSO for cyst  . Laparoscopic salpingo oopherectomy Right 04/17/2014    Procedure: LAPAROSCOPIC SALPINGO OOPHORECTOMY RIGHT;  Surgeon: Adolphus Birchwood, MD;   Location: [facility];  Service: Gynecology;  Laterality: Right;  . Lysis of adhesion  04/17/2014    Procedure: LYSIS OF ADHESION;  Surgeon: Adolphus Birchwood, MD;  Location: [facility];  Service: Gynecology;;  . Irrigation and debridement hematoma Right 11/12/2015    Procedure: IRRIGATION AND DEBRIDEMENT HEMATOMA;  Surgeon: Earline Mayotte, MD;  Location: [facility];  Service: General;  Laterality: Right;    Family History  Problem Relation Age of Onset  . Heart disease Mother   . Lung cancer Mother   . Diabetes Mother     later in life  . Coronary artery disease Mother   . Heart disease Father   . Arrhythmia Sister   . Breast cancer Neg Hx     Social History Social History  Substance Use Topics  . Smoking status: Former Smoker -- 0.30 packs/day for 4 years    Quit date: 07/11/2002  . Smokeless tobacco: Never Used  . Alcohol Use: 0.5 oz/week    1 Standard drinks or equivalent per week     Comment: occasional beer and wine    Allergies  Allergen Reactions  . Bee Venom Anaphylaxis  . Citalopram Nausea Only  . Tape  Other (See Comments)    Thin skin    Current Outpatient Prescriptions  Medication Sig Dispense Refill  . EPINEPHrine (EPIPEN 2-PAK) 0.3 mg/0.3 mL IJ SOAJ injection 0.3 mg/0.3 mL  prn as needed    . HYDROcodone-acetaminophen (NORCO/VICODIN) 5-325 MG per tablet     . ibuprofen (ADVIL,MOTRIN) 800 MG tablet Take 1 tablet (800 mg total) by mouth every 8 (eight) hours as needed. 30 tablet 0  . levothyroxine (SYNTHROID, LEVOTHROID) 100 MCG tablet Take 100 mcg by mouth daily before breakfast.    . oxyCODONE-acetaminophen (ROXICET) 5-325 MG tablet Take 1-2 tablets by mouth every 4 (four) hours as needed for moderate pain or severe pain. 40 tablet 0  . PARoxetine (PAXIL) 10 MG tablet TAKE 1 TABLET BY MOUTH EVERY DAY AT BEDTIME 30 tablet 6  . traMADol (ULTRAM) 50 MG tablet Take by mouth every 6 (six) hours as needed.    . traZODone (DESYREL) 50 MG tablet Take 50 mg by mouth at bedtime as  needed for sleep.    . Vitamin D, Ergocalciferol, (DRISDOL) 50000 UNITS CAPS capsule Take 50,000 Units by mouth every Monday.    . zolpidem (AMBIEN) 10 MG tablet Take 10 mg by mouth at bedtime.     No current facility-administered medications for this visit.    Review of Systems Review of Systems  Constitutional: Negative.   Respiratory: Negative.   Cardiovascular: Negative.     Blood pressure 140/80, pulse 76, resp. rate 14, height 5\' 6"  (1.676 m), weight 253 lb (114.76 kg).  Physical Exam Physical Exam  Musculoskeletal:       Legs:   Data Reviewed Culture no growth at 24 hours.  Assessment    Marked improvement in right leg pain status post drainage of hematoma.    Plan    The leg was cleansed and a new bulky, minimally compressive wrap applied. This will remain in place for 3 days. She is encouraged to keep the leg elevated.     PCP: Ramonita Lab This has been scribed by Lesly Rubenstein LPN    Robert Bellow 11/13/2015, 1:04 PM

## 2015-11-15 LAB — WOUND CULTURE
Culture: NO GROWTH
Gram Stain: NONE SEEN

## 2015-11-16 ENCOUNTER — Encounter: Payer: Self-pay | Admitting: General Surgery

## 2015-11-16 ENCOUNTER — Ambulatory Visit (INDEPENDENT_AMBULATORY_CARE_PROVIDER_SITE_OTHER): Payer: BLUE CROSS/BLUE SHIELD | Admitting: General Surgery

## 2015-11-16 VITALS — BP 138/78 | HR 80 | Resp 14 | Ht 66.0 in | Wt 253.0 lb

## 2015-11-16 DIAGNOSIS — S8011XD Contusion of right lower leg, subsequent encounter: Secondary | ICD-10-CM

## 2015-11-16 LAB — ANAEROBIC CULTURE

## 2015-11-16 NOTE — Progress Notes (Signed)
Patient ID: [name], female   DOB: Jun 10, 1961, 55 y.o.   MRN: [id number]  Chief Complaint  Patient presents with  . Routine Post Op    HPI Mary [name] is a 55 y.o. female for right leg hematoma I&D follow up. She reports the leg is doing well. She is only taking her pain medication at night. She does report some burning and stinging sensation once in a while.    I personally reviewed the patient's history.  HPI  Past Medical History  Diagnosis Date  . Palpitations   . Hyperlipidemia   . PAC (premature atrial contraction)   . Hypothyroidism   . Sleep apnea     after weight loss does not have to use cpap  . Peripheral vascular disease ([place])     "small" clot after thermal ablasion  . Arthritis   . Anemia     low iron at times  . Lactose intolerance   . Panic attacks     Pt reports restrictive clothing/areas and dry mouth cause her to have attack.  Marland Kitchen Heart murmur     " small per patient"   . Complication of anesthesia 1991    Pt reports she awoke during plantar fascia surgery and felt "everything"  . Shortness of breath     going upstairs (due to weight). not so much since weight loss  . Pneumonia 2016    "walking pneumonia" 3 times in 2016    Past Surgical History  Procedure Laterality Date  . 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 Plantar fascia surgery  1999  . Cesarean section  1987    twins  . Appendectomy  1985  . Colonoscopy    . Laminectomy  07/10/2013    L4   L5   DR Ronnald Ramp  . Maximum access (mas)posterior lumbar interbody fusion (plif) 1 level  07/10/2013    Procedure: FOR MAXIMUM ACCESS (MAS) POSTERIOR LUMBAR INTERBODY FUSION (PLIF) LUMBAR FOUR-FIVE;  Surgeon: Eustace Moore, MD;  Location: [facility];  Service: Neurosurgery;;  FOR MAXIMUM ACCESS (MAS) POSTERIOR LUMBAR INTERBODY FUSION (PLIF) LUMBAR FOUR-FIVE  . Oophorectomy      LSO  . Vaginal hysterectomy  2001    for bleeding and LSO for cyst  . Laparoscopic salpingo oopherectomy Right 04/17/2014     Procedure: LAPAROSCOPIC SALPINGO OOPHORECTOMY RIGHT;  Surgeon: Everitt Amber, MD;  Location: [facility];  Service: Gynecology;  Laterality: Right;  . Lysis of adhesion  04/17/2014    Procedure: LYSIS OF ADHESION;  Surgeon: Everitt Amber, MD;  Location: [facility];  Service: Gynecology;;  . Irrigation and debridement hematoma Right 11/12/2015    Procedure: IRRIGATION AND DEBRIDEMENT HEMATOMA;  Surgeon: Robert Bellow, MD;  Location: [facility];  Service: General;  Laterality: Right;    Family History  Problem Relation Age of Onset  . Heart disease Mother   . Lung cancer Mother   . Diabetes Mother     later in life  . Coronary artery disease Mother   . Heart disease Father   . Arrhythmia Sister   . Breast cancer Neg Hx     Social History Social History  Substance Use Topics  . Smoking status: Former Smoker -- 0.30 packs/day for 4 years    Quit date: 07/11/2002  . Smokeless tobacco: Never Used  . Alcohol Use: 0.5 oz/week    1 Standard drinks or equivalent per week     Comment: occasional beer and wine    Allergies  Allergen Reactions  . Bee Venom Anaphylaxis  . Citalopram Nausea Only  . Tape Other (See Comments)    Thin skin    Current Outpatient Prescriptions  Medication Sig Dispense Refill  . EPINEPHrine (EPIPEN 2-PAK) 0.3 mg/0.3 mL IJ SOAJ injection 0.3 mg/0.3 mL  prn as needed    . HYDROcodone-acetaminophen (NORCO/VICODIN) 5-325 MG per tablet     . ibuprofen (ADVIL,MOTRIN) 800 MG tablet Take 1 tablet (800 mg total) by mouth every 8 (eight) hours as needed. 30 tablet 0  . levothyroxine (SYNTHROID, LEVOTHROID) 100 MCG tablet Take 100 mcg by mouth daily before breakfast.    . oxyCODONE-acetaminophen (ROXICET) 5-325 MG tablet Take 1-2 tablets by mouth every 4 (four) hours as needed for moderate pain or severe pain. 40 tablet 0  . PARoxetine (PAXIL) 10 MG tablet TAKE 1 TABLET BY MOUTH EVERY DAY AT BEDTIME 30 tablet 6  . traMADol (ULTRAM) 50 MG tablet Take by mouth every 6 (six) hours as  needed.    . traZODone (DESYREL) 50 MG tablet Take 50 mg by mouth at bedtime as needed for sleep.    . Vitamin D, Ergocalciferol, (DRISDOL) 50000 UNITS CAPS capsule Take 50,000 Units by mouth every Monday.    . zolpidem (AMBIEN) 10 MG tablet Take 10 mg by mouth at bedtime.     No current facility-administered medications for this visit.    Review of Systems Review of Systems  Constitutional: Negative.   Respiratory: Negative.   Cardiovascular: Negative.     Blood pressure 138/78, pulse 80, resp. rate 14, height 5\' 6"  (1.676 m), weight 253 lb (114.76 kg).  Physical Exam Physical Exam  Musculoskeletal:       Legs:   Data Reviewed Culture showed no pathogens.  Assessment    Resolving hematoma status post incision and drainage.    Plan    The patient will remove the light compressive wrap applied today and begin showers tomorrow. She will apply a light dressing with tape. She'll increase her activity as she is comfortable. Nursing follow-up on 11/19/2015. Physician exam in one week.    PCP: Ramonita Lab This has been scribed by Caryl-Lyn Otis Brace LPN    Robert Bellow 11/17/2015, 4:55 PM

## 2015-11-16 NOTE — Patient Instructions (Signed)
Keep leg elevated. Keep clean and dry. Remove your dressing tomorrow then shower. Follow up in 3 days with the nurse.

## 2015-11-19 ENCOUNTER — Ambulatory Visit (INDEPENDENT_AMBULATORY_CARE_PROVIDER_SITE_OTHER): Payer: BLUE CROSS/BLUE SHIELD | Admitting: *Deleted

## 2015-11-19 DIAGNOSIS — S8011XD Contusion of right lower leg, subsequent encounter: Secondary | ICD-10-CM

## 2015-11-19 NOTE — Patient Instructions (Signed)
The patient is aware to call back for any questions or concerns.  

## 2015-11-19 NOTE — Progress Notes (Signed)
Patient came in today for a wound check.  The wound is clean, with no signs of infection noted. Swelling has improved. She states the area is still tender. Small opening remains. Dr Bary Castilla assessed area as well.Follow up as scheduled.

## 2015-11-24 ENCOUNTER — Encounter: Payer: Self-pay | Admitting: General Surgery

## 2015-11-24 ENCOUNTER — Ambulatory Visit (INDEPENDENT_AMBULATORY_CARE_PROVIDER_SITE_OTHER): Payer: BLUE CROSS/BLUE SHIELD | Admitting: General Surgery

## 2015-11-24 VITALS — BP 134/78 | HR 80 | Resp 12 | Ht 66.0 in | Wt 257.0 lb

## 2015-11-24 DIAGNOSIS — S8011XD Contusion of right lower leg, subsequent encounter: Secondary | ICD-10-CM

## 2015-11-24 NOTE — Patient Instructions (Signed)
The patient is aware to call back for any questions or concerns.  

## 2015-11-24 NOTE — Progress Notes (Signed)
Patient ID: Mary [name], female   DOB: 04-11-1961, 55 y.o.   MRN: [id number]  Chief Complaint  Patient presents with  . Follow-up    HPI Mary [name] is a 55 y.o. female.  Here today for follow up hematoma right lower leg. She states each day is getting better.   HPI  Past Medical History  Diagnosis Date  . Palpitations   . Hyperlipidemia   . PAC (premature atrial contraction)   . Hypothyroidism   . Sleep apnea     after weight loss does not have to use cpap  . Peripheral vascular disease ([place])     "small" clot after thermal ablasion  . Arthritis   . Anemia     low iron at times  . Lactose intolerance   . Panic attacks     Pt reports restrictive clothing/areas and dry mouth cause her to have attack.  Marland Kitchen Heart murmur     " small per patient"   . Complication of anesthesia 1991    Pt reports she awoke during plantar fascia surgery and felt "everything"  . Shortness of breath     going upstairs (due to weight). not so much since weight loss  . Pneumonia 2016    "walking pneumonia" 3 times in 2016    Past Surgical History  Procedure Laterality Date  . 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 Plantar fascia surgery  1999  . Cesarean section  1987    twins  . Appendectomy  1985  . Colonoscopy    . Laminectomy  07/10/2013    L4   L5   DR Ronnald Ramp  . Maximum access (mas)posterior lumbar interbody fusion (plif) 1 level  07/10/2013    Procedure: FOR MAXIMUM ACCESS (MAS) POSTERIOR LUMBAR INTERBODY FUSION (PLIF) LUMBAR FOUR-FIVE;  Surgeon: Eustace Moore, MD;  Location: [facility];  Service: Neurosurgery;;  FOR MAXIMUM ACCESS (MAS) POSTERIOR LUMBAR INTERBODY FUSION (PLIF) LUMBAR FOUR-FIVE  . Oophorectomy      LSO  . Vaginal hysterectomy  2001    for bleeding and LSO for cyst  . Laparoscopic salpingo oopherectomy Right 04/17/2014    Procedure: LAPAROSCOPIC SALPINGO OOPHORECTOMY RIGHT;  Surgeon: Everitt Amber, MD;  Location: [facility];  Service: Gynecology;  Laterality: Right;  .  Lysis of adhesion  04/17/2014    Procedure: LYSIS OF ADHESION;  Surgeon: Everitt Amber, MD;  Location: [facility];  Service: Gynecology;;  . Irrigation and debridement hematoma Right 11/12/2015    Procedure: IRRIGATION AND DEBRIDEMENT HEMATOMA;  Surgeon: Robert Bellow, MD;  Location: [facility];  Service: General;  Laterality: Right;    Family History  Problem Relation Age of Onset  . Heart disease Mother   . Lung cancer Mother   . Diabetes Mother     later in life  . Coronary artery disease Mother   . Heart disease Father   . Arrhythmia Sister   . Breast cancer Neg Hx     Social History Social History  Substance Use Topics  . Smoking status: Former Smoker -- 0.30 packs/day for 4 years    Quit date: 07/11/2002  . Smokeless tobacco: Never Used  . Alcohol Use: 0.5 oz/week    1 Standard drinks or equivalent per week     Comment: occasional beer and wine    Allergies  Allergen Reactions  . Bee Venom Anaphylaxis  . Citalopram Nausea Only  . Tape Other (See Comments)    Thin skin    Current  Outpatient Prescriptions  Medication Sig Dispense Refill  . EPINEPHrine (EPIPEN 2-PAK) 0.3 mg/0.3 mL IJ SOAJ injection 0.3 mg/0.3 mL  prn as needed    . ibuprofen (ADVIL,MOTRIN) 800 MG tablet Take 1 tablet (800 mg total) by mouth every 8 (eight) hours as needed. 30 tablet 0  . levothyroxine (SYNTHROID, LEVOTHROID) 100 MCG tablet Take 100 mcg by mouth daily before breakfast.    . oxyCODONE-acetaminophen (ROXICET) 5-325 MG tablet Take 1-2 tablets by mouth every 4 (four) hours as needed for moderate pain or severe pain. 40 tablet 0  . PARoxetine (PAXIL) 10 MG tablet TAKE 1 TABLET BY MOUTH EVERY DAY AT BEDTIME 30 tablet 6  . traZODone (DESYREL) 50 MG tablet Take 50 mg by mouth at bedtime as needed for sleep.    . Vitamin D, Ergocalciferol, (DRISDOL) 50000 UNITS CAPS capsule Take 50,000 Units by mouth every Monday.    . zolpidem (AMBIEN) 10 MG tablet Take 10 mg by mouth at bedtime.     No current  facility-administered medications for this visit.    Review of Systems Review of Systems  Constitutional: Negative.   Respiratory: Negative.   Cardiovascular: Negative.     Blood pressure 134/78, pulse 80, resp. rate 12, height 5\' 6"  (1.676 m), weight 257 lb (116.574 kg).  Physical Exam Physical Exam  Constitutional: She is oriented to person, place, and time. She appears well-developed and well-nourished.  Musculoskeletal:       Legs: Neurological: She is alert and oriented to person, place, and time.  Skin: Skin is warm and dry.  Improving.  Psychiatric: Her behavior is normal.      Assessment    Doing well status post incision and drainage of right posterior calf hematoma.    Plan    The remaining defect should proceed to heal without incident. She may resume tub baths.     Follow up as needed. The patient is aware to call back for any questions or concerns.   PCP:  Ramonita Lab  This information has been scribed by Karie Fetch RN, BSN,BC.   Robert Bellow 11/25/2015, 8:55 PM

## 2015-12-16 DIAGNOSIS — Z6841 Body Mass Index (BMI) 40.0 and over, adult: Secondary | ICD-10-CM | POA: Insufficient documentation

## 2016-01-25 ENCOUNTER — Other Ambulatory Visit: Payer: Self-pay | Admitting: Physician Assistant

## 2016-09-07 ENCOUNTER — Other Ambulatory Visit: Payer: Self-pay | Admitting: Family Medicine

## 2016-09-07 DIAGNOSIS — Z1231 Encounter for screening mammogram for malignant neoplasm of breast: Secondary | ICD-10-CM

## 2016-10-12 ENCOUNTER — Ambulatory Visit
Admission: RE | Admit: 2016-10-12 | Discharge: 2016-10-12 | Disposition: A | Discharge status: Home / Self Care | Payer: BLUE CROSS/BLUE SHIELD | Source: Ambulatory Visit | Attending: Family Medicine | Admitting: Family Medicine

## 2016-10-12 DIAGNOSIS — Z1231 Encounter for screening mammogram for malignant neoplasm of breast: Secondary | ICD-10-CM | POA: Diagnosis present

## 2017-09-24 IMAGING — MG MM DIGITAL SCREENING BILAT W/ CAD
4 series · 4 of 4 positions shown · non-contrast
Comparison: Previous exam(s).

CLINICAL DATA: Screening.

EXAM:
DIGITAL SCREENING BILATERAL MAMMOGRAM WITH CAD

[L MLO]
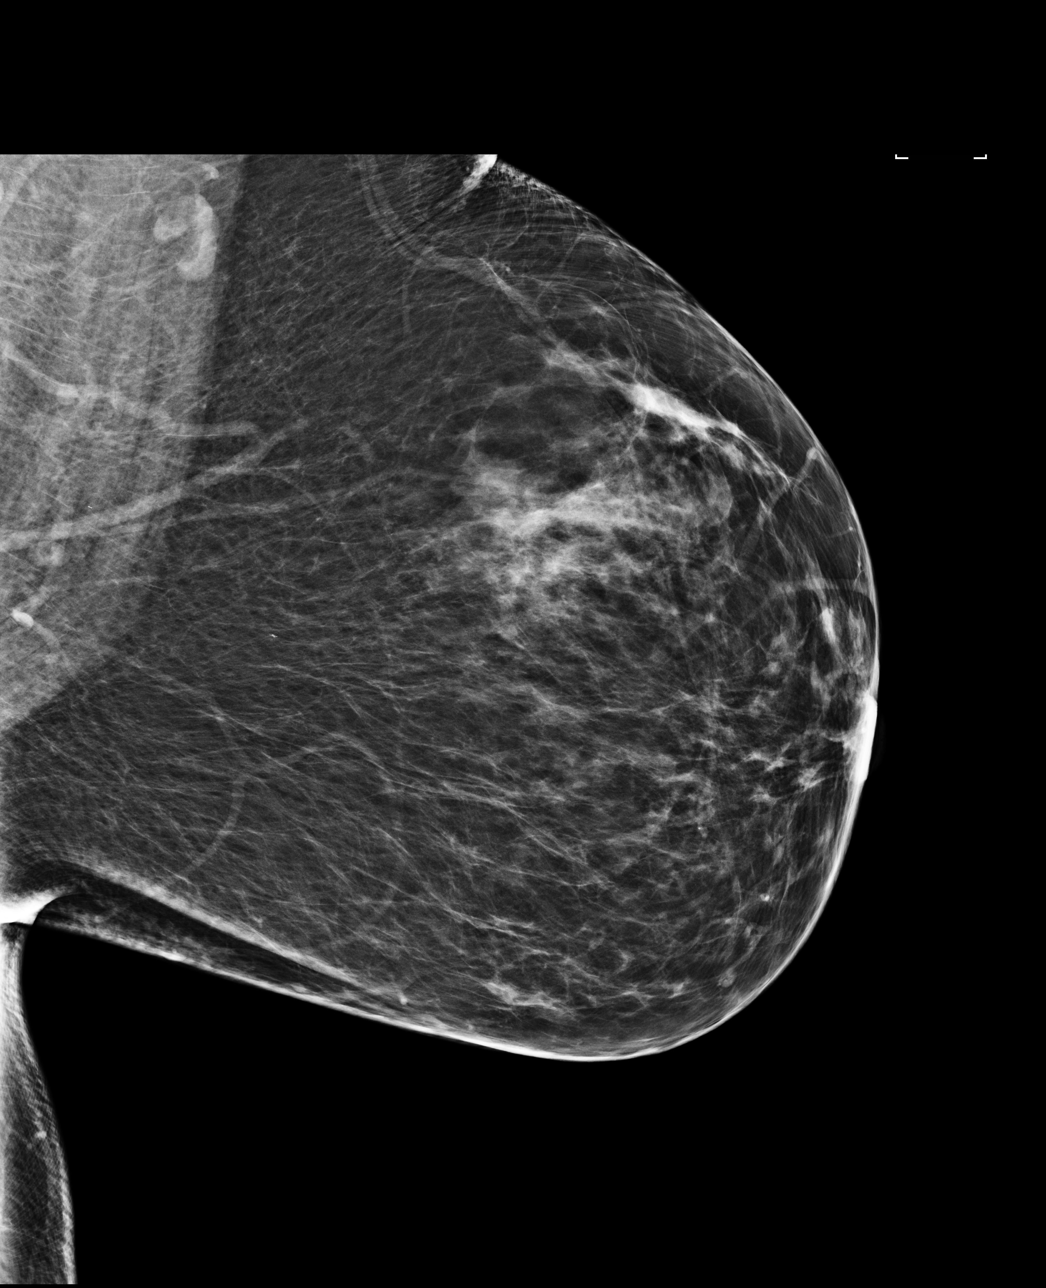

[R MLO]
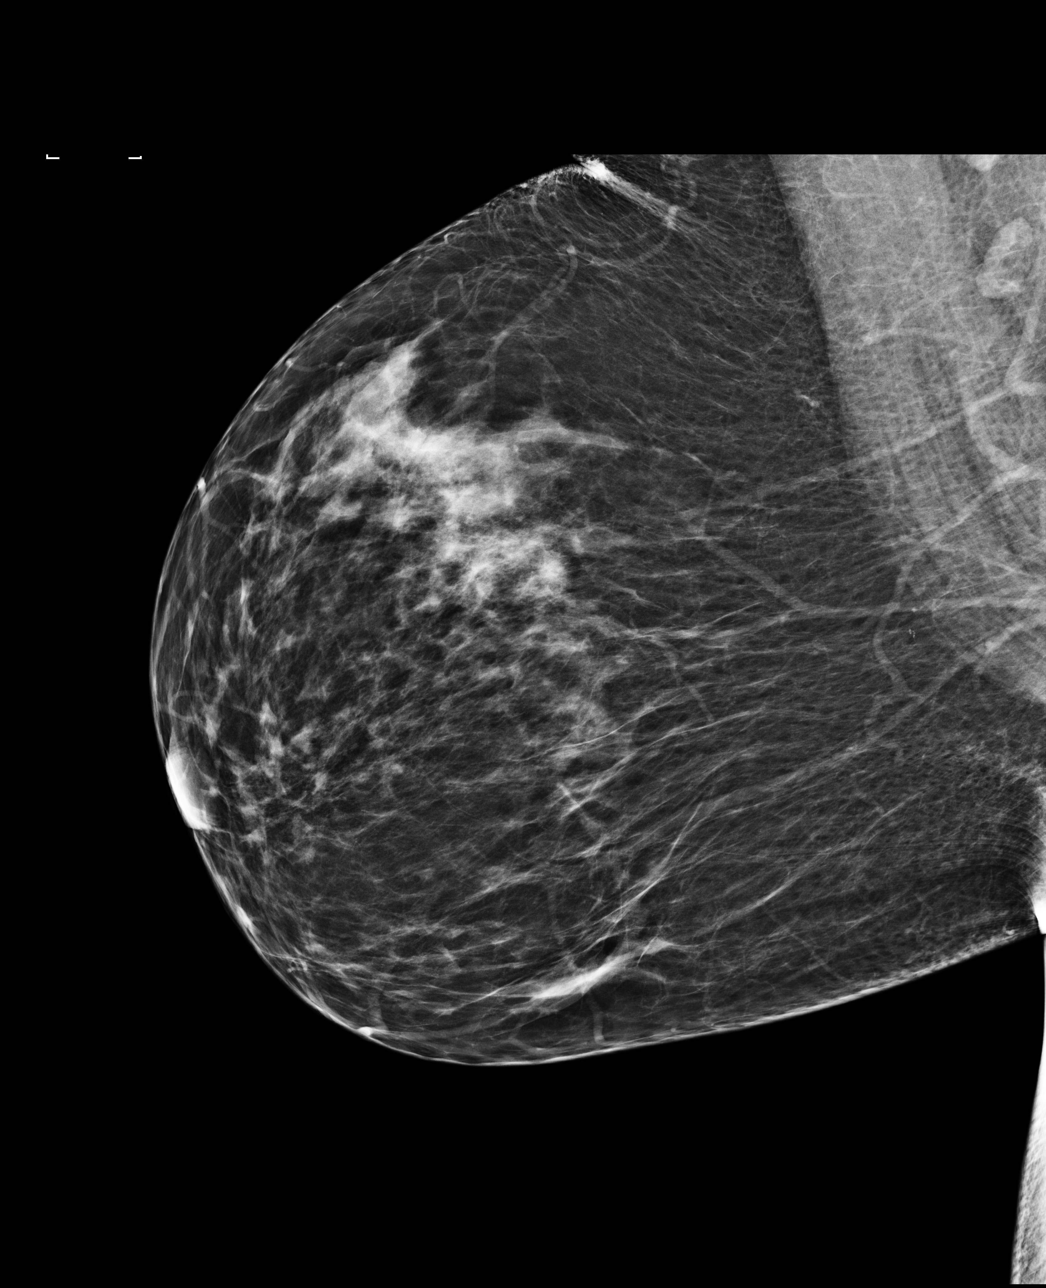

[L CC]
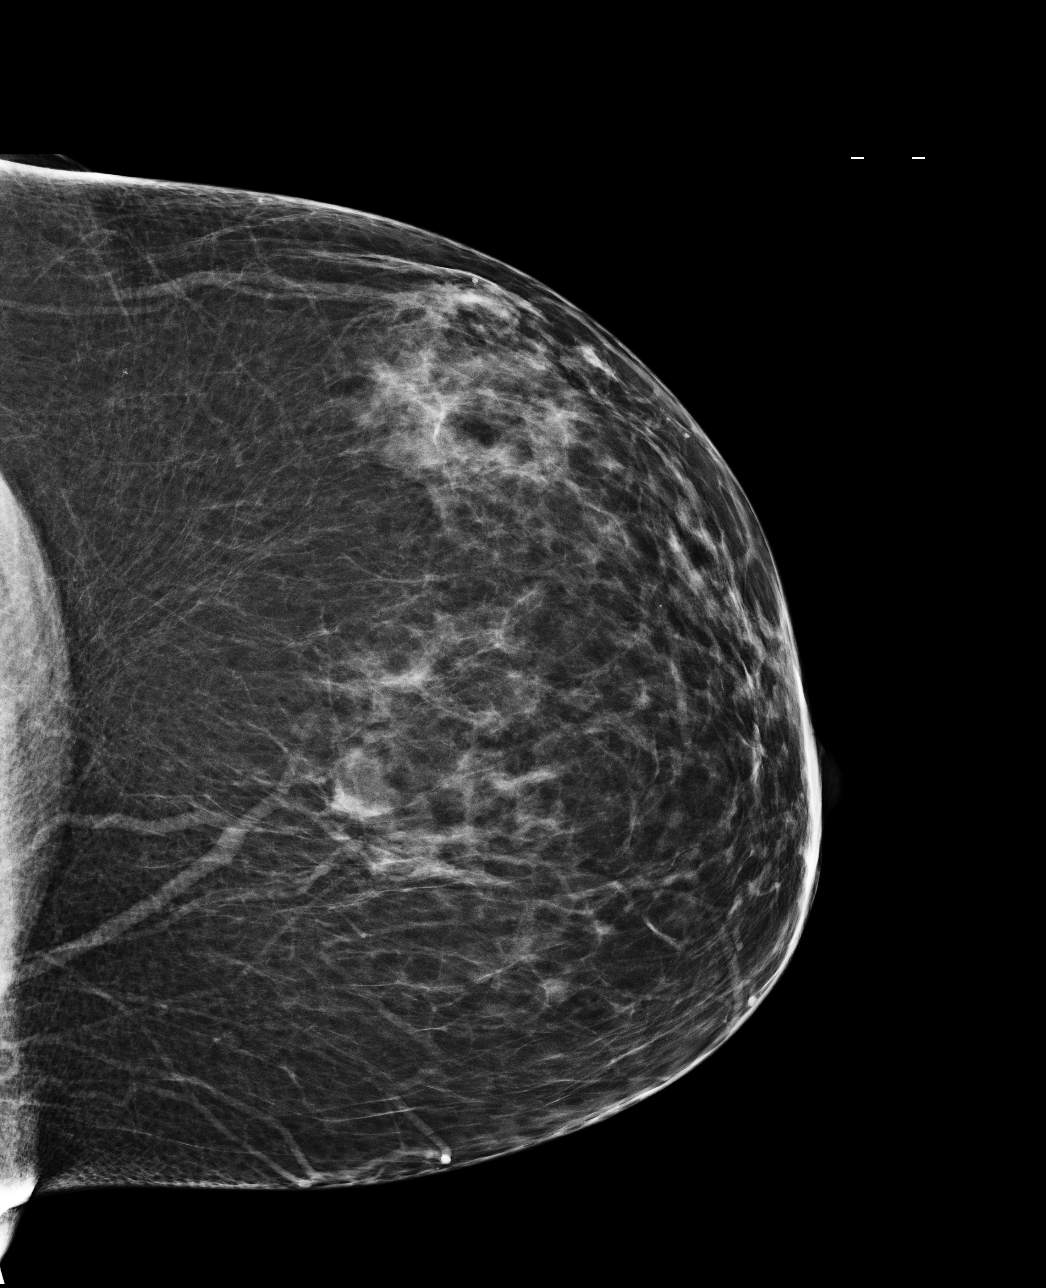

[R CC]
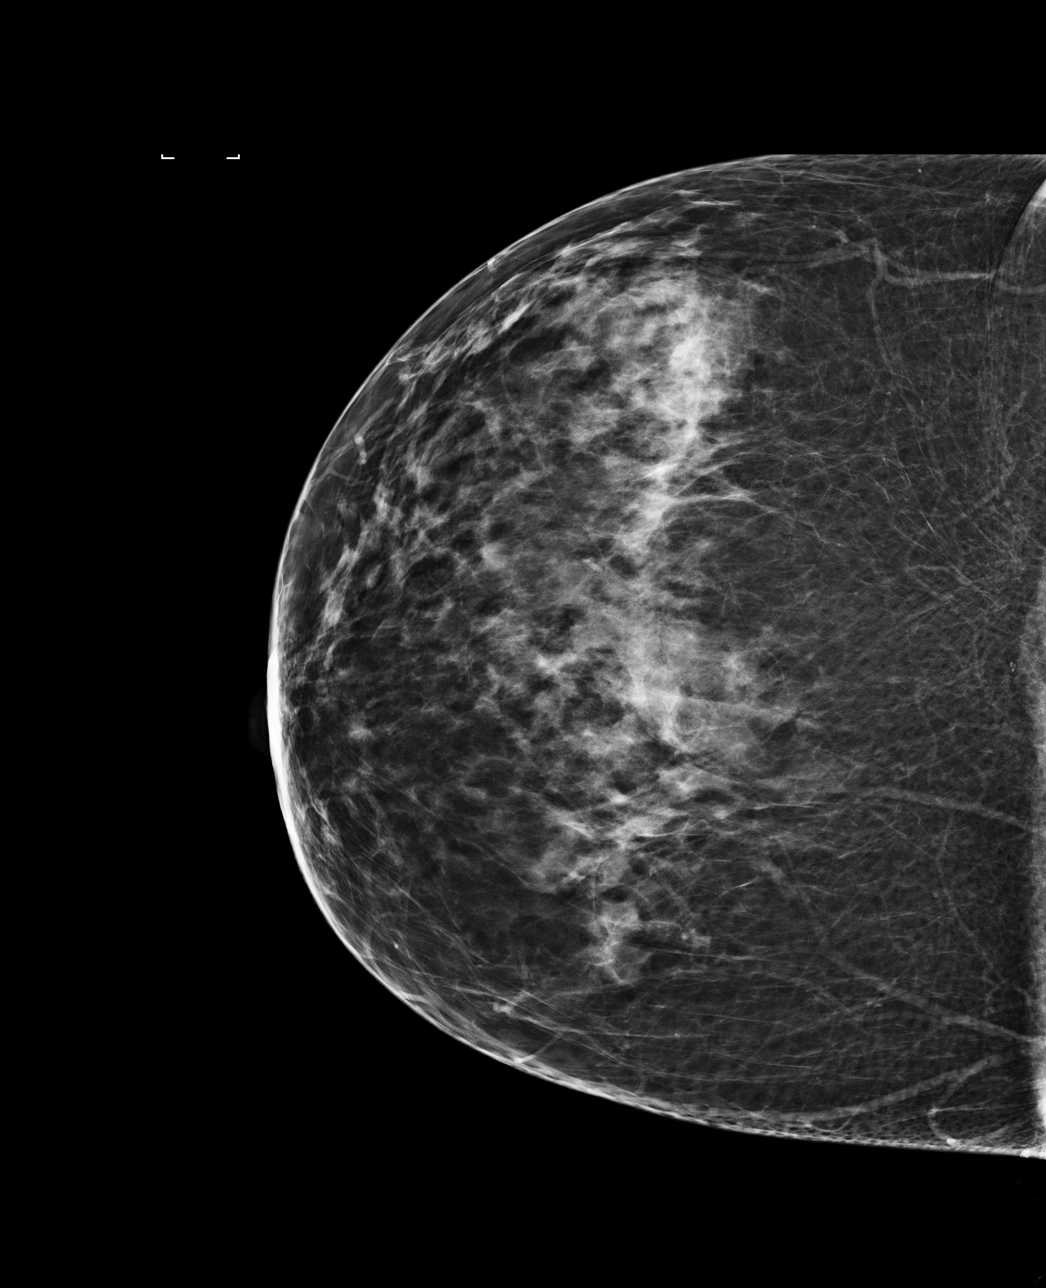

[4 of 4 positions shown; findings below may reference images not displayed]

ACR Breast Density Category c: The breast tissue is heterogeneously
dense, which may obscure small masses.
FINDINGS: There are no findings suspicious for malignancy. Images were
processed with CAD.
IMPRESSION: No mammographic evidence of malignancy. A result letter of this
screening mammogram will be mailed directly to the patient.

RECOMMENDATION:
Screening mammogram in one year. (Code:[id number]-[id number])

BI-RADS CATEGORY  1: Negative.

## 2018-01-04 ENCOUNTER — Other Ambulatory Visit: Payer: Self-pay | Admitting: Family Medicine

## 2018-01-04 DIAGNOSIS — Z1231 Encounter for screening mammogram for malignant neoplasm of breast: Secondary | ICD-10-CM

## 2018-02-26 ENCOUNTER — Encounter: Payer: Self-pay | Admitting: *Deleted

## 2018-02-27 ENCOUNTER — Encounter: Payer: Self-pay | Admitting: *Deleted

## 2018-02-27 ENCOUNTER — Ambulatory Visit: Payer: BLUE CROSS/BLUE SHIELD | Admitting: Anesthesiology

## 2018-02-27 ENCOUNTER — Encounter
Admission: RE | Disposition: A | Discharge status: Home / Self Care | Payer: Self-pay | Source: Ambulatory Visit | Attending: Internal Medicine

## 2018-02-27 ENCOUNTER — Ambulatory Visit
Admission: RE | Admit: 2018-02-27 | Discharge: 2018-02-27 | Disposition: A | Discharge status: Home / Self Care | Payer: BLUE CROSS/BLUE SHIELD | Source: Ambulatory Visit | Attending: Internal Medicine | Admitting: Internal Medicine

## 2018-02-27 DIAGNOSIS — F410 Panic disorder [episodic paroxysmal anxiety] without agoraphobia: Secondary | ICD-10-CM | POA: Diagnosis not present

## 2018-02-27 DIAGNOSIS — I739 Peripheral vascular disease, unspecified: Secondary | ICD-10-CM | POA: Insufficient documentation

## 2018-02-27 DIAGNOSIS — K5730 Diverticulosis of large intestine without perforation or abscess without bleeding: Secondary | ICD-10-CM | POA: Insufficient documentation

## 2018-02-27 DIAGNOSIS — Z9884 Bariatric surgery status: Secondary | ICD-10-CM | POA: Insufficient documentation

## 2018-02-27 DIAGNOSIS — Z86010 Personal history of colonic polyps: Secondary | ICD-10-CM | POA: Insufficient documentation

## 2018-02-27 DIAGNOSIS — Z09 Encounter for follow-up examination after completed treatment for conditions other than malignant neoplasm: Secondary | ICD-10-CM | POA: Insufficient documentation

## 2018-02-27 DIAGNOSIS — E039 Hypothyroidism, unspecified: Secondary | ICD-10-CM | POA: Insufficient documentation

## 2018-02-27 DIAGNOSIS — K640 First degree hemorrhoids: Secondary | ICD-10-CM | POA: Insufficient documentation

## 2018-02-27 DIAGNOSIS — G4730 Sleep apnea, unspecified: Secondary | ICD-10-CM | POA: Diagnosis not present

## 2018-02-27 DIAGNOSIS — Z79899 Other long term (current) drug therapy: Secondary | ICD-10-CM | POA: Insufficient documentation

## 2018-02-27 DIAGNOSIS — Z8371 Family history of colonic polyps: Secondary | ICD-10-CM | POA: Insufficient documentation

## 2018-02-27 DIAGNOSIS — Z87891 Personal history of nicotine dependence: Secondary | ICD-10-CM | POA: Diagnosis not present

## 2018-02-27 DIAGNOSIS — Z79890 Hormone replacement therapy (postmenopausal): Secondary | ICD-10-CM | POA: Diagnosis not present

## 2018-02-27 HISTORY — PX: COLONOSCOPY WITH PROPOFOL: SHX5780

## 2018-02-27 SURGERY — COLONOSCOPY WITH PROPOFOL
Anesthesia: General

## 2018-02-27 MED ORDER — LIDOCAINE HCL (CARDIAC) PF 100 MG/5ML IV SOSY
PREFILLED_SYRINGE | INTRAVENOUS | Status: DC | PRN
  Administered 2018-02-27: 40 mg via INTRAVENOUS

## 2018-02-27 MED ORDER — SODIUM CHLORIDE 0.9 % IV SOLN
INTRAVENOUS | Status: DC
  Administered 2018-02-27: 1000 mL via INTRAVENOUS

## 2018-02-27 MED ORDER — PROPOFOL 10 MG/ML IV BOLUS
INTRAVENOUS | Status: AC
  Filled 2018-02-27: qty 20

## 2018-02-27 MED ORDER — PROPOFOL 10 MG/ML IV BOLUS
INTRAVENOUS | Status: DC | PRN
  Administered 2018-02-27: 30 mg via INTRAVENOUS
  Administered 2018-02-27: 50 mg via INTRAVENOUS

## 2018-02-27 MED ORDER — PROPOFOL 500 MG/50ML IV EMUL
INTRAVENOUS | Status: DC | PRN
  Administered 2018-02-27: 125 ug/kg/min via INTRAVENOUS

## 2018-02-27 NOTE — Transfer of Care (Signed)
Immediate Anesthesia Transfer of Care Note  Patient: Mary [name]  Procedure(s) Performed: COLONOSCOPY WITH PROPOFOL (N/A )  Patient Location: Endoscopy Unit  Anesthesia Type:General  Level of Consciousness: awake, alert  and oriented  Airway & Oxygen Therapy: Patient Spontanous Breathing  Post-op Assessment: Report given to RN and Post -op Vital signs reviewed and stable  Post vital signs: Reviewed and stable  Last Vitals:  Vitals Value Taken Time  BP    Temp    Pulse    Resp    SpO2      Last Pain:  Vitals:   02/27/18 1223  TempSrc: Tympanic  PainSc: 0-No pain         Complications: No apparent anesthesia complications

## 2018-02-27 NOTE — Op Note (Signed)
[facility] Gastroenterology Patient Name: Mary [name] Procedure Date: 02/27/2018 12:42 PM MRN: [id number] Account #: [id number] Date of Birth: Sep 21, 1960 Admit Type: Outpatient Age: 57 Room: [facility] ENDO ROOM 2 Gender: Female Note Status: Finalized Procedure:            Colonoscopy Indications:          High risk colon cancer surveillance: Personal history                        of colonic polyps Providers:            Benay Pike. Alice Reichert MD, MD Referring MD:         Ramonita Lab, MD (Referring MD) Medicines:            Propofol per Anesthesia Complications:        No immediate complications. Procedure:            Pre-Anesthesia Assessment:                       - The risks and benefits of the procedure and the                        sedation options and risks were discussed with the                        patient. All questions were answered and informed                        consent was obtained.                       - Patient identification and proposed procedure were                        verified prior to the procedure by the nurse. The                        procedure was verified in the procedure room.                       - ASA Grade Assessment: III - A patient with severe                        systemic disease.                       - After reviewing the risks and benefits, the patient                        was deemed in satisfactory condition to undergo the                        procedure.                       After obtaining informed consent, the colonoscope was                        passed under direct vision. Throughout the procedure,  the patient's blood pressure, pulse, and oxygen                        saturations were monitored continuously. The                        Colonoscope was introduced through the anus and                        advanced to the the cecum, identified by appendiceal                        orifice  and ileocecal valve. The colonoscopy was                        performed without difficulty. The patient tolerated the                        procedure well. The quality of the bowel preparation                        was good. The entire colon was examined. Findings:      The perianal and digital rectal examinations were normal. Pertinent       negatives include normal sphincter tone and no palpable rectal lesions.      A few small-mouthed diverticula were found in the sigmoid colon.      Non-bleeding internal hemorrhoids were found during retroflexion. The       hemorrhoids were Grade I (internal hemorrhoids that do not prolapse).      The exam was otherwise without abnormality. Impression:           - Diverticulosis in the sigmoid colon.                       - Non-bleeding internal hemorrhoids.                       - The examination was otherwise normal.                       - No specimens collected. Recommendation:       - Patient has a contact number available for                        emergencies. The signs and symptoms of potential                        delayed complications were discussed with the patient.                        Return to normal activities tomorrow. Written discharge                        instructions were provided to the patient.                       - Resume previous diet.                       - Continue present medications.                       -  Repeat colonoscopy in 5 years for surveillance.                       - Return to GI office PRN.                       - The findings and recommendations were discussed with                        the patient and their spouse. Procedure Code(s):    --- Professional ---                       W2585, Colorectal cancer screening; colonoscopy on                        individual at high risk Diagnosis Code(s):    --- Professional ---                       K57.30, Diverticulosis of large intestine without                         perforation or abscess without bleeding                       K64.0, First degree hemorrhoids                       Z86.010, Personal history of colonic polyps CPT copyright 2017 [employer]. All rights reserved. The codes documented in this report are preliminary and upon coder review may  be revised to meet current compliance requirements. Efrain Sella MD, MD 02/27/2018 1:10:03 PM This report has been signed electronically. Number of Addenda: 0 Note Initiated On: 02/27/2018 12:42 PM Scope Withdrawal Time: 0 hours 8 minutes 57 seconds  Total Procedure Duration: 0 hours 12 minutes 35 seconds       [facility]

## 2018-02-27 NOTE — Anesthesia Preprocedure Evaluation (Signed)
Anesthesia Evaluation  Patient identified by MRN, date of birth, ID band Patient awake    Reviewed: Allergy & Precautions, H&P , NPO status , Patient's Chart, lab work & pertinent test results  History of Anesthesia Complications (+) AWARENESS UNDER ANESTHESIA and history of anesthetic complications  Airway Mallampati: III  TM Distance: <3 FB Neck ROM: full    Dental  (+) Chipped   Pulmonary shortness of breath and with exertion, sleep apnea , pneumonia, former smoker,           Cardiovascular Exercise Tolerance: Good (-) angina+ Peripheral Vascular Disease  (-) Past MI + Valvular Problems/Murmurs      Neuro/Psych Anxiety negative neurological ROS  negative psych ROS   GI/Hepatic negative GI ROS, Neg liver ROS,   Endo/Other  Hypothyroidism   Renal/GU negative Renal ROS  negative genitourinary   Musculoskeletal  (+) Arthritis ,   Abdominal   Peds  Hematology negative hematology ROS (+)   Anesthesia Other Findings Past Medical History: No date: Anemia     Comment:  low iron at times No date: Arthritis 1448: Complication of anesthesia     Comment:  Pt reports she awoke during plantar fascia surgery and               felt "everything" No date: Heart murmur     Comment:  " small per patient"  No date: Hyperlipidemia No date: Hypothyroidism No date: Lactose intolerance No date: PAC (premature atrial contraction) No date: Palpitations No date: Panic attacks     Comment:  Pt reports restrictive clothing/areas and dry mouth               cause her to have attack. No date: Peripheral vascular disease ([place])     Comment:  "small" clot after thermal ablasion 2016: Pneumonia     Comment:  "walking pneumonia" 3 times in 2016 No date: Shortness of breath     Comment:  going upstairs (due to weight). not so much since weight              loss No date: Sleep apnea     Comment:  after weight loss does not have to use  cpap  Past Surgical History: 1985: APPENDECTOMY 1987: CESAREAN SECTION     Comment:  twins No date: COLONOSCOPY 11/12/2015: IRRIGATION AND DEBRIDEMENT HEMATOMA; Right     Comment:  Procedure: IRRIGATION AND DEBRIDEMENT HEMATOMA;                Surgeon: Robert Bellow, MD;  Location: [place] [facility];                Service: General;  Laterality: Right; 07/10/2013: LAMINECTOMY     Comment:  L4   L5   DR Ronnald Ramp 04/17/2014: LAPAROSCOPIC SALPINGO OOPHERECTOMY; Right     Comment:  Procedure: LAPAROSCOPIC SALPINGO OOPHORECTOMY RIGHT;                Surgeon: Everitt Amber, MD;  Location: [facility];  Service:               Gynecology;  Laterality: Right; 04/17/2014: LYSIS OF ADHESION     Comment:  Procedure: LYSIS OF ADHESION;  Surgeon: Everitt Amber, MD;               Location: [facility];  Service: Gynecology;; 07/10/2013: MAXIMUM ACCESS (MAS)POSTERIOR LUMBAR INTERBODY FUSION  (PLIF) 1 LEVEL     Comment:  Procedure: FOR MAXIMUM ACCESS (MAS) POSTERIOR LUMBAR  INTERBODY FUSION (PLIF) LUMBAR FOUR-FIVE;  Surgeon: Eustace Moore, MD;  Location: [facility];  Service:               Neurosurgery;;  FOR MAXIMUM ACCESS (MAS) POSTERIOR LUMBAR              INTERBODY FUSION (PLIF) LUMBAR FOUR-FIVE No date: OOPHORECTOMY     Comment:  LSO 1999: PLANTAR FASCIA SURGERY 06/2006: ROUX-EN-Y GASTRIC BYPASS 2001: VAGINAL HYSTERECTOMY     Comment:  for bleeding and LSO for cyst  BMI    Body Mass Index:  42.93 kg/m      Reproductive/Obstetrics negative OB ROS                             Anesthesia Physical Anesthesia Plan  ASA: III  Anesthesia Plan: General   Post-op Pain Management:    Induction: Intravenous  PONV Risk Score and Plan: Propofol infusion and TIVA  Airway Management Planned: Natural Airway and Nasal Cannula  Additional Equipment:   Intra-op Plan:   Post-operative Plan:   Informed Consent: I have reviewed the patients History and Physical,  chart, labs and discussed the procedure including the risks, benefits and alternatives for the proposed anesthesia with the patient or authorized representative who has indicated his/her understanding and acceptance.   Dental Advisory Given  Plan Discussed with: Anesthesiologist, CRNA and Surgeon  Anesthesia Plan Comments: (Patient consented for risks of anesthesia including but not limited to:  - adverse reactions to medications - risk of intubation if required - damage to teeth, lips or other oral mucosa - sore throat or hoarseness - Damage to heart, brain, lungs or loss of life  Patient voiced understanding.)        Anesthesia Quick Evaluation

## 2018-02-27 NOTE — H&P (Signed)
Outpatient short stay form Pre-procedure 02/27/2018 12:24 PM Teodoro K. Alice Reichert, M.D.  Primary Physician: Ramonita Lab, M.D.  Reason for visit:  Colon cancer screening, FH colon polyps.  History of present illness: As above. Patient denies change in bowel habits, rectal bleeding, weight loss or abdominal pain.      Current Facility-Administered Medications:  .  0.9 %  sodium chloride infusion, , Intravenous, Continuous, [place], Benay Pike, MD  Medications Prior to Admission  Medication Sig Dispense Refill Last Dose  . busPIRone (BUSPAR) 5 MG tablet Take 5 mg by mouth 3 (three) times daily.     Marland Kitchen levothyroxine (SYNTHROID, LEVOTHROID) 100 MCG tablet Take 100 mcg by mouth daily before breakfast.   02/27/2018 at Unknown time  . EPINEPHrine (EPIPEN 2-PAK) 0.3 mg/0.3 mL IJ SOAJ injection 0.3 mg/0.3 mL  prn as needed   Taking  . ibuprofen (ADVIL,MOTRIN) 800 MG tablet Take 1 tablet (800 mg total) by mouth every 8 (eight) hours as needed. 30 tablet 0 Taking  . PARoxetine (PAXIL) 10 MG tablet TAKE 1 TABLET BY MOUTH EVERY DAY AT BEDTIME 30 tablet 6 Taking  . Vitamin D, Ergocalciferol, (DRISDOL) 50000 UNITS CAPS capsule Take 50,000 Units by mouth every Monday.   Taking  . zolpidem (AMBIEN) 10 MG tablet Take 10 mg by mouth at bedtime.   Taking     Allergies  Allergen Reactions  . Bee Venom Anaphylaxis  . Citalopram Nausea Only  . Tape Other (See Comments)    Thin skin     Past Medical History:  Diagnosis Date  . Anemia    low iron at times  . Arthritis   . Complication of anesthesia 1991   Pt reports she awoke during plantar fascia surgery and felt "everything"  . Heart murmur    " small per patient"   . Hyperlipidemia   . Hypothyroidism   . Lactose intolerance   . PAC (premature atrial contraction)   . Palpitations   . Panic attacks    Pt reports restrictive clothing/areas and dry mouth cause her to have attack.  . Peripheral vascular disease ([place])    "small" clot after thermal  ablasion  . Pneumonia 2016   "walking pneumonia" 3 times in 2016  . Shortness of breath    going upstairs (due to weight). not so much since weight loss  . Sleep apnea    after weight loss does not have to use cpap    Review of systems:  Otherwise negative.    Physical Exam  Gen: Alert, oriented. Appears stated age.  HEENT: [place]/AT. PERRLA. Lungs: CTA, no wheezes. CV: RR nl S1, S2. Abd: soft, benign, no masses. BS+ Ext: No edema. Pulses 2+    Planned procedures: Proceed with colonoscopy. The patient understands the nature of the planned procedure, indications, risks, alternatives and potential complications including but not limited to bleeding, infection, perforation, damage to internal organs and possible oversedation/side effects from anesthesia. The patient agrees and gives consent to proceed.  Please refer to procedure notes for findings, recommendations and patient disposition/instructions.     Teodoro K. Alice Reichert, M.D. Gastroenterology 02/27/2018  12:24 PM

## 2018-02-27 NOTE — Anesthesia Postprocedure Evaluation (Signed)
Anesthesia Post Note  Patient: Mary [name]  Procedure(s) Performed: COLONOSCOPY WITH PROPOFOL (N/A )  Patient location during evaluation: PACU Anesthesia Type: General Level of consciousness: awake and alert Pain management: pain level controlled Vital Signs Assessment: post-procedure vital signs reviewed and stable Respiratory status: spontaneous breathing, nonlabored ventilation and respiratory function stable Cardiovascular status: blood pressure returned to baseline and stable Postop Assessment: no apparent nausea or vomiting Anesthetic complications: no     Last Vitals:  Vitals:   02/27/18 1322 02/27/18 1332  BP: (!) 88/64 121/88  Pulse: 63 (!) 59  Resp: 14 12  Temp:    SpO2: 99% 98%    Last Pain:  Vitals:   02/27/18 1332  TempSrc:   PainSc: 0-No pain                 Durenda Hurt

## 2018-02-27 NOTE — Anesthesia Post-op Follow-up Note (Signed)
Anesthesia QCDR form completed.        

## 2018-02-27 NOTE — Interval H&P Note (Signed)
History and Physical Interval Note:  02/27/2018 12:24 PM  Mary [name]  has presented today for surgery, with the diagnosis of FAMILY HX.OF COLON POLYPS  The various methods of treatment have been discussed with the patient and family. After consideration of risks, benefits and other options for treatment, the patient has consented to  Procedure(s): COLONOSCOPY WITH PROPOFOL (N/A) as a surgical intervention .  The patient's history has been reviewed, patient examined, no change in status, stable for surgery.  I have reviewed the patient's chart and labs.  Questions were answered to the patient's satisfaction.     [place], [place]

## 2018-02-28 ENCOUNTER — Encounter: Payer: Self-pay | Admitting: Internal Medicine

## 2018-05-09 ENCOUNTER — Ambulatory Visit
Admission: RE | Admit: 2018-05-09 | Discharge: 2018-05-09 | Disposition: A | Discharge status: Home / Self Care | Payer: BLUE CROSS/BLUE SHIELD | Source: Ambulatory Visit | Attending: Family Medicine | Admitting: Family Medicine

## 2018-05-09 DIAGNOSIS — Z1231 Encounter for screening mammogram for malignant neoplasm of breast: Secondary | ICD-10-CM | POA: Diagnosis not present

## 2018-09-16 ENCOUNTER — Other Ambulatory Visit: Payer: Self-pay

## 2018-09-16 ENCOUNTER — Encounter: Payer: Self-pay | Admitting: Emergency Medicine

## 2018-09-16 ENCOUNTER — Emergency Department: Payer: BLUE CROSS/BLUE SHIELD

## 2018-09-16 DIAGNOSIS — Y999 Unspecified external cause status: Secondary | ICD-10-CM | POA: Diagnosis not present

## 2018-09-16 DIAGNOSIS — S6982XA Other specified injuries of left wrist, hand and finger(s), initial encounter: Secondary | ICD-10-CM | POA: Diagnosis present

## 2018-09-16 DIAGNOSIS — Y929 Unspecified place or not applicable: Secondary | ICD-10-CM | POA: Insufficient documentation

## 2018-09-16 DIAGNOSIS — E039 Hypothyroidism, unspecified: Secondary | ICD-10-CM | POA: Insufficient documentation

## 2018-09-16 DIAGNOSIS — W010XXA Fall on same level from slipping, tripping and stumbling without subsequent striking against object, initial encounter: Secondary | ICD-10-CM | POA: Diagnosis not present

## 2018-09-16 DIAGNOSIS — S52502A Unspecified fracture of the lower end of left radius, initial encounter for closed fracture: Secondary | ICD-10-CM | POA: Diagnosis not present

## 2018-09-16 DIAGNOSIS — Z87891 Personal history of nicotine dependence: Secondary | ICD-10-CM | POA: Diagnosis not present

## 2018-09-16 DIAGNOSIS — Z79899 Other long term (current) drug therapy: Secondary | ICD-10-CM | POA: Diagnosis not present

## 2018-09-16 DIAGNOSIS — Y9389 Activity, other specified: Secondary | ICD-10-CM | POA: Diagnosis not present

## 2018-09-16 MED ORDER — FENTANYL CITRATE (PF) 100 MCG/2ML IJ SOLN
100.0000 ug | Freq: Once | INTRAMUSCULAR | Status: AC
  Administered 2018-09-16: 100 ug via NASAL
  Filled 2018-09-16: qty 2

## 2018-09-16 NOTE — ED Triage Notes (Signed)
Pt fell tonight while changing clothes, landing on the left wrist. Obvious deformity to left wrist. Pt is diaphoretic and pale. See MAR for intervention.

## 2018-09-17 ENCOUNTER — Emergency Department: Payer: BLUE CROSS/BLUE SHIELD

## 2018-09-17 ENCOUNTER — Other Ambulatory Visit: Payer: Self-pay

## 2018-09-17 ENCOUNTER — Other Ambulatory Visit: Payer: Self-pay | Admitting: Orthopedic Surgery

## 2018-09-17 ENCOUNTER — Emergency Department
Admission: EM | Admit: 2018-09-17 | Discharge: 2018-09-17 | Disposition: A | Discharge status: Home / Self Care | Payer: BLUE CROSS/BLUE SHIELD | Attending: Emergency Medicine | Admitting: Emergency Medicine

## 2018-09-17 ENCOUNTER — Ambulatory Visit
Admission: RE | Admit: 2018-09-17 | Discharge: 2018-09-17 | Disposition: A | Discharge status: Home / Self Care | Payer: BLUE CROSS/BLUE SHIELD | Source: Ambulatory Visit | Attending: Orthopedic Surgery | Admitting: Orthopedic Surgery

## 2018-09-17 DIAGNOSIS — W19XXXA Unspecified fall, initial encounter: Secondary | ICD-10-CM

## 2018-09-17 DIAGNOSIS — S52502A Unspecified fracture of the lower end of left radius, initial encounter for closed fracture: Secondary | ICD-10-CM

## 2018-09-17 DIAGNOSIS — T148XXA Other injury of unspecified body region, initial encounter: Secondary | ICD-10-CM

## 2018-09-17 DIAGNOSIS — S52572A Other intraarticular fracture of lower end of left radius, initial encounter for closed fracture: Secondary | ICD-10-CM

## 2018-09-17 MED ORDER — OXYCODONE-ACETAMINOPHEN 5-325 MG PO TABS
ORAL_TABLET | ORAL | Status: AC
  Filled 2018-09-17: qty 1

## 2018-09-17 MED ORDER — OXYCODONE-ACETAMINOPHEN 5-325 MG PO TABS
1.0000 | ORAL_TABLET | Freq: Once | ORAL | Status: AC
  Administered 2018-09-17: 1 via ORAL

## 2018-09-17 MED ORDER — MORPHINE SULFATE (PF) 4 MG/ML IV SOLN
4.0000 mg | Freq: Once | INTRAVENOUS | Status: AC
  Administered 2018-09-17: 4 mg via INTRAVENOUS
  Filled 2018-09-17: qty 1

## 2018-09-17 MED ORDER — OXYCODONE-ACETAMINOPHEN 5-325 MG PO TABS: 1.0000 | ORAL_TABLET | ORAL | 0 refills | Status: DC | PRN

## 2018-09-17 MED ORDER — LIDOCAINE HCL (PF) 1 % IJ SOLN
10.0000 mL | Freq: Once | INTRAMUSCULAR | Status: AC
  Administered 2018-09-17: 10 mL via INTRADERMAL
  Filled 2018-09-17: qty 10

## 2018-09-17 MED ORDER — ONDANSETRON HCL 4 MG/2ML IJ SOLN
4.0000 mg | Freq: Once | INTRAMUSCULAR | Status: AC
  Administered 2018-09-17: 4 mg via INTRAVENOUS
  Filled 2018-09-17: qty 2

## 2018-09-17 NOTE — ED Notes (Signed)
MD and tech at bedside to do splint.

## 2018-09-17 NOTE — ED Provider Notes (Signed)
[facility] Emergency Department Provider Note  ____________________________________________  Time seen: Approximately 6:32 AM  I have reviewed the triage vital signs and the nursing notes.   HISTORY  Chief Complaint Fall   HPI Mary [name] is a 58 y.o. female with history as listed below who presents for evaluation of left wrist pain.  Patient is right-handed.  She was putting her pajamas when she tripped on her pants and fell.  She fell on outstretched hand.  She noticed obvious deformity of the hand.  She developed acute severe sharp pain that is worse with minimal movement.  She denies elbow pain or shoulder pain.  She denies head trauma or LOC.  She is on blood thinners.  No neck pain or back pain.  Past Medical History:  Diagnosis Date  . Anemia    low iron at times  . Arthritis   . Complication of anesthesia 1991   Pt reports she awoke during plantar fascia surgery and felt "everything"  . Heart murmur    " small per patient"   . Hyperlipidemia   . Hypothyroidism   . Lactose intolerance   . PAC (premature atrial contraction)   . Palpitations   . Panic attacks    Pt reports restrictive clothing/areas and dry mouth cause her to have attack.  . Peripheral vascular disease ([place])    "small" clot after thermal ablasion  . Pneumonia 2016   "walking pneumonia" 3 times in 2016  . Shortness of breath    going upstairs (due to weight). not so much since weight loss  . Sleep apnea    after weight loss does not have to use cpap    Patient Active Problem List   Diagnosis Date Noted  . Hematoma of lower extremity 11/12/2015  . S/P lumbar spinal fusion 07/10/2013  . Palpitations 01/02/2012  . Edema of both legs 01/02/2012    Past Surgical History:  Procedure Laterality Date  . APPENDECTOMY  1985  . CESAREAN SECTION  1987   twins  . COLONOSCOPY    . COLONOSCOPY WITH PROPOFOL N/A 02/27/2018   Procedure: COLONOSCOPY WITH PROPOFOL;  Surgeon:  [place], Benay Pike, MD;  Location: [facility] ENDOSCOPY;  Service: Gastroenterology;  Laterality: N/A;  . IRRIGATION AND DEBRIDEMENT HEMATOMA Right 11/12/2015   Procedure: IRRIGATION AND DEBRIDEMENT HEMATOMA;  Surgeon: Robert Bellow, MD;  Location: [facility] ORS;  Service: General;  Laterality: Right;  . LAMINECTOMY  07/10/2013   L4   L5   DR Ronnald Ramp  . LAPAROSCOPIC SALPINGO OOPHERECTOMY Right 04/17/2014   Procedure: LAPAROSCOPIC SALPINGO OOPHORECTOMY RIGHT;  Surgeon: Everitt Amber, MD;  Location: [facility] ORS;  Service: Gynecology;  Laterality: Right;  . LYSIS OF ADHESION  04/17/2014   Procedure: LYSIS OF ADHESION;  Surgeon: Everitt Amber, MD;  Location: [facility] ORS;  Service: Gynecology;;  . MAXIMUM ACCESS (MAS)POSTERIOR LUMBAR INTERBODY FUSION (PLIF) 1 LEVEL  07/10/2013   Procedure: FOR MAXIMUM ACCESS (MAS) POSTERIOR LUMBAR INTERBODY FUSION (PLIF) LUMBAR FOUR-FIVE;  Surgeon: Eustace Moore, MD;  Location: [facility];  Service: Neurosurgery;;  FOR MAXIMUM ACCESS (MAS) POSTERIOR LUMBAR INTERBODY FUSION (PLIF) LUMBAR FOUR-FIVE  . OOPHORECTOMY     LSO  . PLANTAR FASCIA SURGERY  1999  . 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 VAGINAL HYSTERECTOMY  2001   for bleeding and LSO for cyst    Prior to Admission medications   Medication Sig Start Date End Date Taking? Authorizing Provider  busPIRone (BUSPAR) 5 MG tablet Take 5  mg by mouth 3 (three) times daily.    [provider]  EPINEPHrine (EPIPEN 2-PAK) 0.3 mg/0.3 mL IJ SOAJ injection 0.3 mg/0.3 mL  prn as needed 12/22/10   [provider]  ibuprofen (ADVIL,MOTRIN) 800 MG tablet Take 1 tablet (800 mg total) by mouth every 8 (eight) hours as needed. 04/25/14   Everitt Amber, MD  levothyroxine (SYNTHROID, LEVOTHROID) 100 MCG tablet Take 100 mcg by mouth daily before breakfast.    [provider]  oxyCODONE-acetaminophen (PERCOCET) 5-325 MG tablet Take 1 tablet by mouth every 4 (four) hours as needed. 09/17/18   Rudene Re, MD  PARoxetine (PAXIL) 10  MG tablet TAKE 1 TABLET BY MOUTH EVERY DAY AT BEDTIME 06/01/15   Mar Daring, PA-C  Vitamin D, Ergocalciferol, (DRISDOL) 50000 UNITS CAPS capsule Take 50,000 Units by mouth every Monday.    [provider]  zolpidem (AMBIEN) 10 MG tablet Take 10 mg by mouth at bedtime.    [provider]    Allergies Bee venom; Citalopram; and Tape  Family History  Problem Relation Age of Onset  . Heart disease Mother   . Lung cancer Mother   . Diabetes Mother        later in life  . Coronary artery disease Mother   . Heart disease Father   . Arrhythmia Sister   . Breast cancer Sister 29       diagnosed 09/09/2016    Social History Social History   Tobacco Use  . Smoking status: Former Smoker    Packs/day: 0.30    Years: 4.00    Pack years: 1.20    Last attempt to quit: 07/11/2002    Years since quitting: 16.1  . Smokeless tobacco: Never Used  Substance Use Topics  . Alcohol use: Yes    Alcohol/week: 1.0 standard drinks    Types: 1 Standard drinks or equivalent per week    Comment: occasional beer and wine  . Drug use: No    Review of Systems  Constitutional: Negative for fever. Eyes: Negative for visual changes. ENT: Negative for sore throat. Neck: No neck pain  Cardiovascular: Negative for chest pain. Respiratory: Negative for shortness of breath. Gastrointestinal: Negative for abdominal pain, vomiting or diarrhea. Genitourinary: Negative for dysuria. Musculoskeletal: Negative for back pain. + L wrist pain Skin: Negative for rash. Neurological: Negative for headaches, weakness or numbness. Psych: No SI or HI  ____________________________________________   PHYSICAL EXAM:  VITAL SIGNS: ED Triage Vitals  Enc Vitals Group     BP 09/16/18 2344 121/80     Pulse Rate 09/16/18 2344 66     Resp 09/16/18 2344 18     Temp 09/16/18 2344 97.7 F (36.5 C)     Temp Source 09/16/18 2344 Oral     SpO2 09/16/18 2344 97 %     Weight --      Height --       Head Circumference --      Peak Flow --      Pain Score 09/17/18 0438 6     Pain Loc --      Pain Edu? --      Excl. in [place]? --     Constitutional: Alert and oriented. Well appearing and in no apparent distress. HEENT:      Head: Normocephalic and atraumatic.         Eyes: Conjunctivae are normal. Sclera is non-icteric.       Mouth/Throat: Mucous membranes are moist.  Neck: Supple with no signs of meningismus. Cardiovascular: Regular rate and rhythm. No murmurs, gallops, or rubs. 2+ symmetrical distal pulses are present in all extremities. No JVD. Respiratory: Normal respiratory effort. Lungs are clear to auscultation bilaterally. No wheezes, crackles, or rhonchi.  Musculoskeletal: Obvious deformity of the left wrist with intact skin and neurovascular exam. Neurologic: Normal speech and language. Face is symmetric. Moving all extremities. No gross focal neurologic deficits are appreciated. Skin: Skin is warm, dry and intact. No rash noted. Psychiatric: Mood and affect are normal. Speech and behavior are normal.  ____________________________________________   LABS (all labs ordered are listed, but only abnormal results are displayed)  Labs Reviewed - No data to display ____________________________________________  EKG  none  ____________________________________________  RADIOLOGY  I have personally reviewed the images performed during this visit and I agree with the Radiologist's read.   Interpretation by Radiologist:  Dg Forearm Left  Result Date: 09/17/2018 CLINICAL DATA:  Pain after slip and fall EXAM: LEFT FOREARM - 2 VIEW COMPARISON:  None. FINDINGS: Acute, closed, dorsally angulated distal radius fracture with intra-articular extension into the radiocarpal and distal radioulnar joints. The ulna appears intact. Soft tissue swelling about the wrist is noted. No elbow joint dislocation or fracture. IMPRESSION: Acute intra-articular dorsally angulated fracture of the  distal radius extending into the radiocarpal and distal radioulnar joints. Electronically Signed   By: Ashley Royalty M.D.   On: 09/17/2018 00:28   Dg Wrist 2 Views Left  Result Date: 09/17/2018 CLINICAL DATA:  Splint EXAM: LEFT WRIST - 2 VIEW COMPARISON:  Radiography from yesterday FINDINGS: Limited lateral view positioning. A splint has been placed. There is a branching distal radius fracture without appreciable change in alignment. IMPRESSION: Distal radius fracture with stable alignment after splinting. Electronically Signed   By: Monte Fantasia M.D.   On: 09/17/2018 06:25   Dg Wrist Complete Left  Result Date: 09/17/2018 CLINICAL DATA:  Pain after fall EXAM: LEFT WRIST - COMPLETE 3+ VIEW COMPARISON:  None. FINDINGS: Acute, closed, dorsally angulated (45 degrees) intra-articular fracture of the distal radius extending into the distal radioulnar joint and radiocarpal joint. Carpal rows are maintained. Slight widening of the scapholunate interval is identified up to 3 mm which is top-normal. Mild osteoarthritic joint space narrowing of the triscaphe articulation of the wrist. Soft tissue swelling over the dorsum of the wrist. IMPRESSION: Acute, dorsally angulated intra-articular fracture of the distal radius extending into the radiocarpal and distal radioulnar joints. Electronically Signed   By: Ashley Royalty M.D.   On: 09/17/2018 00:31     ____________________________________________   PROCEDURES  Procedure(s) performed:yes Reduction of fracture Date/Time: 09/17/2018 6:44 AM Performed by: Rudene Re, MD Authorized by: Rudene Re, MD  Consent: Verbal consent obtained. Risks and benefits: risks, benefits and alternatives were discussed Relevant documents: relevant documents present and verified Test results: test results available and properly labeled Imaging studies: imaging studies available Patient identity confirmed: verbally with patient Time out: Immediately prior to  procedure a "time out" was called to verify the correct patient, procedure, equipment, support staff and site/side marked as required. Local anesthesia used: yes Anesthesia: hematoma block  Anesthesia: Local anesthesia used: yes Local Anesthetic: lidocaine 1% without epinephrine Anesthetic total: 5 mL  Sedation: Patient sedated: no  Patient tolerance: Patient tolerated the procedure well with no immediate complications Comments: Arm hung with finger straps against gravity and then with weights. Improvement of alignment. Patient tolerated well.    Critical Care performed:  None ____________________________________________  INITIAL IMPRESSION / ASSESSMENT AND PLAN / ED COURSE   58 y.o. female with history as listed below who presents for evaluation of left wrist pain.  X-ray concerning for acute intra-articular fracture of the distal radius.  Attempt on reduction was done with the finger straps and weight with some improvement on alignment.  Discussed with Dr. Posey Pronto from [facility] who recommended follow-up today in clinic.  Patient was instructed to call the office at 8 AM.  Arm was splinted and sling given for comfort.  Patient was given a prescription for Percocet for pain.  Discussed nonweightbearing.  Discussed signs and symptoms of compartment syndrome and recommended return if those develop.      As part of my medical decision making, I reviewed the following data within the [place] notes reviewed and incorporated, Old chart reviewed, Radiograph reviewed , A consult was requested and obtained from this/these consultant(s) Orthopedics, Notes from prior ED visits and [place] Controlled Substance Database    Pertinent labs & imaging results that were available during my care of the patient were reviewed by me and considered in my medical decision making (see chart for details).    ____________________________________________   FINAL CLINICAL  IMPRESSION(S) / ED DIAGNOSES  Final diagnoses:  Fall, initial encounter  Closed fracture of distal end of left radius, unspecified fracture morphology, initial encounter      NEW MEDICATIONS STARTED DURING THIS VISIT:  ED Discharge Orders         Ordered    oxyCODONE-acetaminophen (PERCOCET) 5-325 MG tablet  Every 4 hours PRN     09/17/18 0630           Note:  This document was prepared using Dragon voice recognition software and may include unintentional dictation errors.    Alfred Levins, [place], MD 09/17/18 [phone]

## 2018-09-19 MED ORDER — DEXTROSE 5 % IV SOLN
3.0000 g | Freq: Once | INTRAVENOUS | Status: AC
  Administered 2018-09-20: 3 g via INTRAVENOUS
  Filled 2018-09-19: qty 3

## 2018-09-20 ENCOUNTER — Other Ambulatory Visit: Payer: Self-pay

## 2018-09-20 ENCOUNTER — Ambulatory Visit: Payer: BLUE CROSS/BLUE SHIELD

## 2018-09-20 ENCOUNTER — Ambulatory Visit
Admission: RE | Admit: 2018-09-20 | Discharge: 2018-09-20 | Disposition: A | Discharge status: Home / Self Care | Payer: BLUE CROSS/BLUE SHIELD | Attending: Orthopedic Surgery | Admitting: Orthopedic Surgery

## 2018-09-20 ENCOUNTER — Encounter
Admission: RE | Disposition: A | Discharge status: Home / Self Care | Payer: Self-pay | Source: Home / Self Care | Attending: Orthopedic Surgery

## 2018-09-20 ENCOUNTER — Ambulatory Visit: Payer: BLUE CROSS/BLUE SHIELD | Admitting: Anesthesiology

## 2018-09-20 DIAGNOSIS — Z833 Family history of diabetes mellitus: Secondary | ICD-10-CM | POA: Insufficient documentation

## 2018-09-20 DIAGNOSIS — M545 Low back pain: Secondary | ICD-10-CM | POA: Diagnosis not present

## 2018-09-20 DIAGNOSIS — E079 Disorder of thyroid, unspecified: Secondary | ICD-10-CM | POA: Insufficient documentation

## 2018-09-20 DIAGNOSIS — M1990 Unspecified osteoarthritis, unspecified site: Secondary | ICD-10-CM | POA: Insufficient documentation

## 2018-09-20 DIAGNOSIS — I10 Essential (primary) hypertension: Secondary | ICD-10-CM | POA: Diagnosis not present

## 2018-09-20 DIAGNOSIS — Z90710 Acquired absence of both cervix and uterus: Secondary | ICD-10-CM | POA: Diagnosis not present

## 2018-09-20 DIAGNOSIS — Z8249 Family history of ischemic heart disease and other diseases of the circulatory system: Secondary | ICD-10-CM | POA: Insufficient documentation

## 2018-09-20 DIAGNOSIS — E7800 Pure hypercholesterolemia, unspecified: Secondary | ICD-10-CM | POA: Diagnosis not present

## 2018-09-20 DIAGNOSIS — E739 Lactose intolerance, unspecified: Secondary | ICD-10-CM | POA: Insufficient documentation

## 2018-09-20 DIAGNOSIS — W010XXA Fall on same level from slipping, tripping and stumbling without subsequent striking against object, initial encounter: Secondary | ICD-10-CM | POA: Insufficient documentation

## 2018-09-20 DIAGNOSIS — E559 Vitamin D deficiency, unspecified: Secondary | ICD-10-CM | POA: Diagnosis not present

## 2018-09-20 DIAGNOSIS — R0681 Apnea, not elsewhere classified: Secondary | ICD-10-CM | POA: Diagnosis not present

## 2018-09-20 DIAGNOSIS — E785 Hyperlipidemia, unspecified: Secondary | ICD-10-CM | POA: Insufficient documentation

## 2018-09-20 DIAGNOSIS — Z8371 Family history of colonic polyps: Secondary | ICD-10-CM | POA: Diagnosis not present

## 2018-09-20 DIAGNOSIS — Z888 Allergy status to other drugs, medicaments and biological substances status: Secondary | ICD-10-CM | POA: Diagnosis not present

## 2018-09-20 DIAGNOSIS — S52572A Other intraarticular fracture of lower end of left radius, initial encounter for closed fracture: Secondary | ICD-10-CM | POA: Insufficient documentation

## 2018-09-20 DIAGNOSIS — Z79899 Other long term (current) drug therapy: Secondary | ICD-10-CM | POA: Insufficient documentation

## 2018-09-20 DIAGNOSIS — Z9889 Other specified postprocedural states: Secondary | ICD-10-CM

## 2018-09-20 DIAGNOSIS — Z8781 Personal history of (healed) traumatic fracture: Secondary | ICD-10-CM

## 2018-09-20 HISTORY — PX: OPEN REDUCTION INTERNAL FIXATION (ORIF) DISTAL RADIAL FRACTURE: SHX5989

## 2018-09-20 HISTORY — DX: Essential (primary) hypertension: I10

## 2018-09-20 LAB — BASIC METABOLIC PANEL
Anion gap: 8 (ref 5–15)
BUN: 18 mg/dL (ref 6–20)
CO2: 24 mmol/L (ref 22–32)
Calcium: 8.8 mg/dL — ABNORMAL LOW (ref 8.9–10.3)
Chloride: 108 mmol/L (ref 98–111)
Creatinine, Ser: 0.59 mg/dL (ref 0.44–1.00)
GFR calc Af Amer: >60 mL/min (ref >60)
GFR calc non Af Amer: >60 mL/min (ref >60)
Glucose, Bld: 105 mg/dL — ABNORMAL HIGH (ref 70–99)
Potassium: 3.9 mmol/L (ref 3.5–5.1)
Sodium: 140 mmol/L (ref 135–145)

## 2018-09-20 LAB — CBC
HCT: 40.2 % (ref 36.0–46.0)
Hemoglobin: 13.1 g/dL (ref 12.0–15.0)
MCH: 28 pg (ref 26.0–34.0)
MCHC: 32.6 g/dL (ref 30.0–36.0)
MCV: 85.9 fL (ref 80.0–100.0)
Platelets: 201 10*3/uL (ref 150–400)
RBC: 4.68 MIL/uL (ref 3.87–5.11)
RDW: 15 % (ref 11.5–15.5)
WBC: 8.9 10*3/uL (ref 4.0–10.5)
nRBC: 0 % (ref 0.0–0.2)

## 2018-09-20 SURGERY — OPEN REDUCTION INTERNAL FIXATION (ORIF) DISTAL RADIUS FRACTURE
Anesthesia: General | Site: Wrist | Laterality: Left

## 2018-09-20 MED ORDER — FENTANYL CITRATE (PF) 100 MCG/2ML IJ SOLN
INTRAMUSCULAR | Status: AC
  Administered 2018-09-20: 25 ug via INTRAVENOUS
  Filled 2018-09-20: qty 2

## 2018-09-20 MED ORDER — FENTANYL CITRATE (PF) 100 MCG/2ML IJ SOLN
INTRAMUSCULAR | Status: AC
  Filled 2018-09-20: qty 2

## 2018-09-20 MED ORDER — MIDAZOLAM HCL 2 MG/2ML IJ SOLN
INTRAMUSCULAR | Status: DC | PRN
  Administered 2018-09-20: 2 mg via INTRAVENOUS

## 2018-09-20 MED ORDER — FENTANYL CITRATE (PF) 100 MCG/2ML IJ SOLN
25.0000 ug | INTRAMUSCULAR | Status: AC | PRN
  Administered 2018-09-20 (×2): 25 ug via INTRAVENOUS

## 2018-09-20 MED ORDER — LIDOCAINE HCL (CARDIAC) PF 100 MG/5ML IV SOSY
PREFILLED_SYRINGE | INTRAVENOUS | Status: DC | PRN
  Administered 2018-09-20: 80 mg via INTRAVENOUS

## 2018-09-20 MED ORDER — PROPOFOL 500 MG/50ML IV EMUL
INTRAVENOUS | Status: AC
  Filled 2018-09-20: qty 50

## 2018-09-20 MED ORDER — LIDOCAINE HCL (PF) 2 % IJ SOLN
INTRAMUSCULAR | Status: AC
  Filled 2018-09-20: qty 10

## 2018-09-20 MED ORDER — HYDROCODONE-ACETAMINOPHEN 5-325 MG PO TABS
ORAL_TABLET | ORAL | Status: AC
  Filled 2018-09-20: qty 2

## 2018-09-20 MED ORDER — DEXAMETHASONE SODIUM PHOSPHATE 10 MG/ML IJ SOLN
INTRAMUSCULAR | Status: AC
  Filled 2018-09-20: qty 1

## 2018-09-20 MED ORDER — DEXAMETHASONE SODIUM PHOSPHATE 10 MG/ML IJ SOLN
INTRAMUSCULAR | Status: DC | PRN
  Administered 2018-09-20: 10 mg via INTRAVENOUS

## 2018-09-20 MED ORDER — SUGAMMADEX SODIUM 500 MG/5ML IV SOLN
INTRAVENOUS | Status: DC | PRN
  Administered 2018-09-20: 253.2 mg via INTRAVENOUS

## 2018-09-20 MED ORDER — ONDANSETRON HCL 4 MG/2ML IJ SOLN
INTRAMUSCULAR | Status: DC | PRN
  Administered 2018-09-20: 4 mg via INTRAVENOUS

## 2018-09-20 MED ORDER — SUCCINYLCHOLINE CHLORIDE 20 MG/ML IJ SOLN
INTRAMUSCULAR | Status: DC | PRN
  Administered 2018-09-20: 100 mg via INTRAVENOUS

## 2018-09-20 MED ORDER — GLYCOPYRROLATE 0.2 MG/ML IJ SOLN
INTRAMUSCULAR | Status: AC
  Filled 2018-09-20: qty 1

## 2018-09-20 MED ORDER — LABETALOL HCL 5 MG/ML IV SOLN
5.0000 mg | INTRAVENOUS | Status: AC | PRN
  Administered 2018-09-20 (×4): 5 mg via INTRAVENOUS

## 2018-09-20 MED ORDER — ONDANSETRON HCL 4 MG/2ML IJ SOLN
INTRAMUSCULAR | Status: AC
  Filled 2018-09-20: qty 2

## 2018-09-20 MED ORDER — ACETAMINOPHEN 10 MG/ML IV SOLN
INTRAVENOUS | Status: DC | PRN
  Administered 2018-09-20: 1000 mg via INTRAVENOUS

## 2018-09-20 MED ORDER — FENTANYL CITRATE (PF) 100 MCG/2ML IJ SOLN
25.0000 ug | INTRAMUSCULAR | Status: AC | PRN
  Administered 2018-09-20 (×6): 25 ug via INTRAVENOUS

## 2018-09-20 MED ORDER — PROPOFOL 10 MG/ML IV BOLUS
INTRAVENOUS | Status: DC | PRN
  Administered 2018-09-20: 150 mg via INTRAVENOUS

## 2018-09-20 MED ORDER — LACTATED RINGERS IV SOLN
INTRAVENOUS | Status: DC
  Administered 2018-09-20: 14:00:00 via INTRAVENOUS

## 2018-09-20 MED ORDER — LABETALOL HCL 5 MG/ML IV SOLN
INTRAVENOUS | Status: AC
  Administered 2018-09-20: 5 mg via INTRAVENOUS
  Filled 2018-09-20: qty 4

## 2018-09-20 MED ORDER — FENTANYL CITRATE (PF) 100 MCG/2ML IJ SOLN
INTRAMUSCULAR | Status: DC | PRN
  Administered 2018-09-20: 50 ug via INTRAVENOUS

## 2018-09-20 MED ORDER — ROCURONIUM BROMIDE 100 MG/10ML IV SOLN
INTRAVENOUS | Status: DC | PRN
  Administered 2018-09-20: 10 mg via INTRAVENOUS

## 2018-09-20 MED ORDER — ONDANSETRON HCL 4 MG/2ML IJ SOLN: 4.0000 mg | Freq: Once | INTRAMUSCULAR | Status: DC | PRN

## 2018-09-20 MED ORDER — MIDAZOLAM HCL 2 MG/2ML IJ SOLN
INTRAMUSCULAR | Status: AC
  Filled 2018-09-20: qty 2

## 2018-09-20 MED ORDER — HYDROCODONE-ACETAMINOPHEN 5-325 MG PO TABS
1.0000 | ORAL_TABLET | ORAL | Status: DC | PRN
  Administered 2018-09-20: 2 via ORAL

## 2018-09-20 SURGICAL SUPPLY — 42 items
BANDAGE ACE 4X5 VEL STRL LF (GAUZE/BANDAGES/DRESSINGS) ×2 IMPLANT
BIT DRILL 2 FAST STEP (BIT) ×1 IMPLANT
BIT DRILL 2.5X4 QC (BIT) ×1 IMPLANT
CANISTER SUCT 1200ML W/VALVE (MISCELLANEOUS) ×2 IMPLANT
CHLORAPREP W/TINT 26 (MISCELLANEOUS) ×2 IMPLANT
COVER WAND RF STERILE (DRAPES) ×2 IMPLANT
CUFF TOURN SGL QUICK 18X4 (TOURNIQUET CUFF) IMPLANT
DRAPE FLUOR MINI C-ARM 54X84 (DRAPES) ×2 IMPLANT
ELECT REM PT RETURN 9FT ADLT (ELECTROSURGICAL) ×2
ELECTRODE REM PT RTRN 9FT ADLT (ELECTROSURGICAL) ×1 IMPLANT
GAUZE PETRO XEROFOAM 1X8 (MISCELLANEOUS) ×4 IMPLANT
GAUZE SPONGE 4X4 12PLY STRL (GAUZE/BANDAGES/DRESSINGS) ×2 IMPLANT
GLOVE SURG SYN 9.0  PF PI (GLOVE) ×1
GLOVE SURG SYN 9.0 PF PI (GLOVE) ×1 IMPLANT
GOWN SRG 2XL LVL 4 RGLN SLV (GOWNS) ×1 IMPLANT
GOWN STRL NON-REIN 2XL LVL4 (GOWNS) ×1
GOWN STRL REUS W/ TWL LRG LVL3 (GOWN DISPOSABLE) ×1 IMPLANT
GOWN STRL REUS W/TWL LRG LVL3 (GOWN DISPOSABLE) ×1
K-WIRE 1.6 (WIRE) ×1
K-WIRE FX5X1.6XNS BN SS (WIRE) ×1
KIT TURNOVER KIT A (KITS) ×2 IMPLANT
KWIRE FX5X1.6XNS BN SS (WIRE) IMPLANT
NDL FILTER BLUNT 18X1 1/2 (NEEDLE) ×1 IMPLANT
NEEDLE FILTER BLUNT 18X 1/2SAF (NEEDLE) ×1
NEEDLE FILTER BLUNT 18X1 1/2 (NEEDLE) ×1 IMPLANT
NS IRRIG 500ML POUR BTL (IV SOLUTION) ×2 IMPLANT
PACK EXTREMITY ARMC (MISCELLANEOUS) ×2 IMPLANT
PAD CAST CTTN 4X4 STRL (SOFTGOODS) ×2 IMPLANT
PADDING CAST COTTON 4X4 STRL (SOFTGOODS) ×2
PEG SUBCHONDRAL SMOOTH 2.0X14 (Peg) ×1 IMPLANT
PEG SUBCHONDRAL SMOOTH 2.0X18 (Peg) ×1 IMPLANT
PEG SUBCHONDRAL SMOOTH 2.0X20 (Peg) ×3 IMPLANT
PEG SUBCHONDRAL SMOOTH 2.0X24 (Peg) ×2 IMPLANT
PLATE SHORT 24.4X51.3 LT (Plate) ×1 IMPLANT
SCALPEL PROTECTED #15 DISP (BLADE) ×4 IMPLANT
SCREW CORT 3.5X10 LNG (Screw) ×3 IMPLANT
SPLINT CAST 1 STEP 3X12 (MISCELLANEOUS) ×2 IMPLANT
SUT ETHILON 4-0 (SUTURE) ×1
SUT ETHILON 4-0 FS2 18XMFL BLK (SUTURE) ×1
SUT VICRYL 3-0 27IN (SUTURE) ×2 IMPLANT
SUTURE ETHLN 4-0 FS2 18XMF BLK (SUTURE) ×1 IMPLANT
SYR 3ML LL SCALE MARK (SYRINGE) ×2 IMPLANT

## 2018-09-20 NOTE — H&P (Signed)
Reviewed paper H+P, will be scanned into chart. No changes noted.  

## 2018-09-20 NOTE — Op Note (Signed)
09/20/2018  3:35 PM  PATIENT:  Mary [name]  58 y.o. female  PRE-OPERATIVE DIAGNOSIS:  OTHER CLOSED INTRA ARTICULAR FRACTURE OF DISTAL END OF LEFT RADIUS  POST-OPERATIVE DIAGNOSIS:  OTHER CLOSED INTRA ARTICULAR FRACTURE OF DISTAL END OF LEFT RADIUS  PROCEDURE:  Procedure(s): OPEN REDUCTION INTERNAL FIXATION (ORIF) DISTAL RADIAL FRACTURE, LEFT (Left)  SURGEON: Laurene Footman, MD  ASSISTANTS: None  ANESTHESIA:   general  EBL:  No intake/output data recorded.  BLOOD ADMINISTERED:none  DRAINS: none   LOCAL MEDICATIONS USED:  NONE  SPECIMEN:  No Specimen  DISPOSITION OF SPECIMEN:  N/A  COUNTS:  YES  TOURNIQUET:   21 minutes at 250 mmHg  IMPLANTS: Hand innovations short standard with plate with multiple smooth pegs and screws  DICTATION: .Dragon Dictation patient was brought to the operating room and after adequate general anesthesia was obtained the left arm was prepped and draped in the usual sterile fashion.  Appropriate patient identification and timeout procedures were completed.  Tourniquet was raised and a volar approach was made centered over the FCR tendon.  The tendon sheath was incised and the tendon retracted radially.  The deep fascia was incised and deep retractor placed with the distal radius exposed pronator elevated off the distal fragment and shaft.  Traction was applied at the start of the case with 10 pounds of traction the distal fragment was exposed adequately and a standard with plate fit very well.  This was pinned into position to make sure it was at the appropriate level to avoid pin penetration into the joint.  Distal fixation was carried out first with multiple smooth pegs placed.  Following this the plate was brought down to the shaft and 310 mm cortical screws were placed.  Traction was released at this time and under examination there was no pin penetration into the joint with oblique views volar tilt was restored the dorsal fragment appeared reduced as  well pinned with a smooth peg radial inclination also appeared normal as did length.  The fracture was stable through passive range of motion.  The wound was irrigated and tourniquet let down.  There is no significant bleeding.  The wound was closed with 3-0 Vicryl subcutaneously 4-0 nylon in a simple interrupted fashion followed by Xeroform 4 x 4 4 x 4's web roll volar splint and Ace wrap  PLAN OF CARE: Discharge to home after PACU  PATIENT DISPOSITION:  PACU - hemodynamically stable.

## 2018-09-20 NOTE — Anesthesia Procedure Notes (Signed)
Procedure Name: Intubation Date/Time: 09/20/2018 2:59 PM Performed by: Nelda Marseille, CRNA Pre-anesthesia Checklist: Patient identified, Patient being monitored, Timeout performed, Emergency Drugs available and Suction available Patient Re-evaluated:Patient Re-evaluated prior to induction Oxygen Delivery Method: Circle system utilized Preoxygenation: Pre-oxygenation with 100% oxygen Induction Type: IV induction Ventilation: Mask ventilation without difficulty Laryngoscope Size: Mac, 3 and McGraph Grade View: Grade I Tube type: Oral Tube size: 7.0 mm Number of attempts: 1 Airway Equipment and Method: Stylet Placement Confirmation: ETT inserted through vocal cords under direct vision,  positive ETCO2 and breath sounds checked- equal and bilateral Secured at: 21 cm Tube secured with: Tape Dental Injury: Teeth and Oropharynx as per pre-operative assessment

## 2018-09-20 NOTE — Discharge Instructions (Addendum)
AMBULATORY SURGERY  DISCHARGE INSTRUCTIONS   1) The drugs that you were given will stay in your system until tomorrow so for the next 24 hours you should not:  A) Drive an automobile B) Make any legal decisions C) Drink any alcoholic beverage   2) You may resume regular meals tomorrow.  Today it is better to start with liquids and gradually work up to solid foods.  You may eat anything you prefer, but it is better to start with liquids, then soup and crackers, and gradually work up to solid foods.   3) Please notify your doctor immediately if you have any unusual bleeding, trouble breathing, redness and pain at the surgery site, drainage, fever, or pain not relieved by medication. 4)   5) Your post-operative visit with Dr.                                     is: Date:                        Time:    Please call to schedule your post-operative visit.  6) Additional Instructions:       Keep arm elevated is much as possible through the weekend.  Ice to the back of the wrist may help with pain.  Work on finger range of motion is much as possible.  Pain medicine as previously directed.  Keep splint clean and dry.  If fingers swell a great deal loosen Ace wrap but keep splint and cast padding in place and rewrap with the Ace wrap looser.

## 2018-09-20 NOTE — Anesthesia Post-op Follow-up Note (Signed)
Anesthesia QCDR form completed.        

## 2018-09-20 NOTE — Anesthesia Preprocedure Evaluation (Signed)
Anesthesia Evaluation  Patient identified by MRN, date of birth, ID band Patient awake    Reviewed: Allergy & Precautions, H&P , NPO status , Patient's Chart, lab work & pertinent test results, reviewed documented beta blocker date and time   History of Anesthesia Complications (+) history of anesthetic complications  Airway Mallampati: II  TM Distance: >3 FB Neck ROM: full    Dental  (+) Teeth Intact   Pulmonary neg pulmonary ROS, shortness of breath and with exertion, sleep apnea and Continuous Positive Airway Pressure Ventilation , pneumonia, resolved, former smoker,    Pulmonary exam normal        Cardiovascular Exercise Tolerance: Good hypertension, On Medications + Peripheral Vascular Disease  negative cardio ROS Normal cardiovascular exam+ Valvular Problems/Murmurs  Rhythm:regular Rate:Normal     Neuro/Psych Anxiety negative neurological ROS  negative psych ROS   GI/Hepatic negative GI ROS, Neg liver ROS,   Endo/Other  negative endocrine ROSHypothyroidism Morbid obesity  Renal/GU negative Renal ROS  negative genitourinary   Musculoskeletal   Abdominal   Peds  Hematology negative hematology ROS (+) Blood dyscrasia, anemia ,   Anesthesia Other Findings Past Medical History: No date: Anemia     Comment:  low iron at times No date: Arthritis 6812: Complication of anesthesia     Comment:  Pt reports she awoke during plantar fascia surgery and               felt "everything" No date: Heart murmur     Comment:  " small per patient"  No date: Hyperlipidemia No date: Hypertension No date: Hypothyroidism No date: Lactose intolerance No date: PAC (premature atrial contraction) No date: Palpitations No date: Panic attacks     Comment:  Pt reports restrictive clothing/areas and dry mouth               cause her to have attack. No date: Peripheral vascular disease ([place])     Comment:  "small" clot after  thermal ablasion 2016: Pneumonia     Comment:  "walking pneumonia" 3 times in 2016 No date: Shortness of breath     Comment:  going upstairs (due to weight). not so much since weight              loss No date: Sleep apnea     Comment:  after weight loss does not have to use cpap Past Surgical History: 1985: APPENDECTOMY 1987: CESAREAN SECTION     Comment:  twins No date: COLONOSCOPY 02/27/2018: COLONOSCOPY WITH PROPOFOL; N/A     Comment:  Procedure: COLONOSCOPY WITH PROPOFOL;  Surgeon: [place],               Benay Pike, MD;  Location: [facility] ENDOSCOPY;  Service:               Gastroenterology;  Laterality: N/A; 11/12/2015: IRRIGATION AND DEBRIDEMENT HEMATOMA; Right     Comment:  Procedure: IRRIGATION AND DEBRIDEMENT HEMATOMA;                Surgeon: Robert Bellow, MD;  Location: [facility] ORS;                Service: General;  Laterality: Right; 07/10/2013: LAMINECTOMY     Comment:  L4   L5   DR Ronnald Ramp 04/17/2014: LAPAROSCOPIC SALPINGO OOPHERECTOMY; Right     Comment:  Procedure: LAPAROSCOPIC SALPINGO OOPHORECTOMY RIGHT;                Surgeon: Everitt Amber, MD;  Location:  [facility] ORS;  Service:               Gynecology;  Laterality: Right; 04/17/2014: LYSIS OF ADHESION     Comment:  Procedure: LYSIS OF ADHESION;  Surgeon: Everitt Amber, MD;               Location: [facility];  Service: Gynecology;; 07/10/2013: MAXIMUM ACCESS (MAS)POSTERIOR LUMBAR INTERBODY FUSION  (PLIF) 1 LEVEL     Comment:  Procedure: FOR MAXIMUM ACCESS (MAS) POSTERIOR LUMBAR               INTERBODY FUSION (PLIF) LUMBAR FOUR-FIVE;  Surgeon: Eustace Moore, MD;  Location: [facility];  Service:               Neurosurgery;;  FOR MAXIMUM ACCESS (MAS) POSTERIOR LUMBAR              INTERBODY FUSION (PLIF) LUMBAR FOUR-FIVE No date: OOPHORECTOMY     Comment:  LSO 1999: PLANTAR FASCIA SURGERY 06/2006: ROUX-EN-Y GASTRIC BYPASS 2001: VAGINAL HYSTERECTOMY     Comment:  for bleeding and LSO for cyst BMI    Body Mass Index:   45.03 kg/m     Reproductive/Obstetrics negative OB ROS                             Anesthesia Physical Anesthesia Plan  ASA: III  Anesthesia Plan: General ETT   Post-op Pain Management:    Induction:   PONV Risk Score and Plan:   Airway Management Planned:   Additional Equipment:   Intra-op Plan:   Post-operative Plan:   Informed Consent: I have reviewed the patients History and Physical, chart, labs and discussed the procedure including the risks, benefits and alternatives for the proposed anesthesia with the patient or authorized representative who has indicated his/her understanding and acceptance.     Dental Advisory Given  Plan Discussed with: CRNA  Anesthesia Plan Comments:         Anesthesia Quick Evaluation

## 2018-09-20 NOTE — Transfer of Care (Signed)
Immediate Anesthesia Transfer of Care Note  Patient: Kearney Hard  Procedure(s) Performed: OPEN REDUCTION INTERNAL FIXATION (ORIF) DISTAL RADIAL FRACTURE, LEFT (Left Wrist)  Patient Location: PACU  Anesthesia Type:General  Level of Consciousness: awake and sedated  Airway & Oxygen Therapy: Patient Spontanous Breathing and Patient connected to face mask oxygen  Post-op Assessment: Report given to RN and Post -op Vital signs reviewed and stable  Post vital signs: Reviewed and stable  Last Vitals:  Vitals Value Taken Time  BP    Temp    Pulse 83 09/20/2018  3:37 PM  Resp 7 09/20/2018  3:37 PM  SpO2 100 % 09/20/2018  3:37 PM  Vitals shown include unvalidated device data.  Last Pain:  Vitals:   09/20/18 1317  TempSrc: Oral  PainSc: 2          Complications: No apparent anesthesia complications

## 2018-09-21 NOTE — Anesthesia Postprocedure Evaluation (Signed)
Anesthesia Post Note  Patient: Mary [name]  Procedure(s) Performed: OPEN REDUCTION INTERNAL FIXATION (ORIF) DISTAL RADIAL FRACTURE, LEFT (Left Wrist)  Patient location during evaluation: PACU Anesthesia Type: General Level of consciousness: awake and alert Pain management: pain level controlled Vital Signs Assessment: post-procedure vital signs reviewed and stable Respiratory status: spontaneous breathing, nonlabored ventilation, respiratory function stable and patient connected to nasal cannula oxygen Cardiovascular status: blood pressure returned to baseline and stable Postop Assessment: no apparent nausea or vomiting Anesthetic complications: no     Last Vitals:  Vitals:   09/20/18 1646 09/20/18 1707  BP: (!) 151/74 (!) 148/81  Pulse: 64 (!) 58  Resp: 16 16  Temp: 37.2 C   SpO2: 98% 94%    Last Pain:  Vitals:   09/21/18 0906  TempSrc:   PainSc: 6                  Molli Barrows

## 2018-09-23 ENCOUNTER — Encounter: Payer: Self-pay | Admitting: Orthopedic Surgery

## 2018-12-21 ENCOUNTER — Other Ambulatory Visit: Payer: Self-pay

## 2018-12-25 LAB — TSH: TSH: 5.87 u[IU]/mL — ABNORMAL HIGH (ref 0.450–4.500)

## 2018-12-25 LAB — B12 AND FOLATE PANEL
Folate: 8.7 ng/mL (ref >3.0)
Vitamin B-12: 1422 pg/mL — ABNORMAL HIGH (ref 232–1245)

## 2018-12-25 LAB — VITAMIN D 25 HYDROXY (VIT D DEFICIENCY, FRACTURES): Vit D, 25-Hydroxy: 42.1 ng/mL (ref 30.0–100.0)

## 2019-02-01 ENCOUNTER — Other Ambulatory Visit: Payer: Self-pay

## 2019-02-01 ENCOUNTER — Ambulatory Visit: Payer: Self-pay

## 2019-02-01 DIAGNOSIS — E538 Deficiency of other specified B group vitamins: Secondary | ICD-10-CM

## 2019-02-01 MED ORDER — CYANOCOBALAMIN 1000 MCG/ML IJ SOLN
1000.0000 ug | INTRAMUSCULAR | Status: AC
  Administered 2019-02-01 – 2019-07-16 (×4): 1000 ug via INTRAMUSCULAR

## 2019-02-08 ENCOUNTER — Other Ambulatory Visit: Payer: Self-pay

## 2019-02-08 MED ORDER — EPINEPHRINE 0.3 MG/0.3ML IJ SOAJ: 0.3000 mg | INTRAMUSCULAR | 1 refills | Status: DC | PRN

## 2019-02-08 NOTE — Telephone Encounter (Signed)
Last labs are in chart

## 2019-02-08 NOTE — Telephone Encounter (Signed)
Not refill the Vit D.  is very high dose and noting recent lab value, can be a lower dose for maintenance. Will need to review her chart further and discuss further with patient after review. Did refill the epi-pen.   Called office to have [place] let her know.

## 2019-02-11 NOTE — Telephone Encounter (Signed)
Called pt and voicemail not set up will call back later today

## 2019-02-12 ENCOUNTER — Telehealth: Payer: Self-pay

## 2019-02-12 NOTE — Telephone Encounter (Signed)
Looking at the chart it looks like TSH is due and she is over due for OV, was due in April.

## 2019-02-12 NOTE — Telephone Encounter (Signed)
Lakita returned [employer] phone call.  Informed her Mearl Latin was calling her to schedule an appointment regarding Vitamin D prescription & possible labwork.  L[facility]ia states they are out of town this week & she will call back to the office sometime next week to schedule an appointment.  AMD

## 2019-02-12 NOTE — Telephone Encounter (Signed)
Spoke with MD and pt needs office visit, called left message on machine

## 2019-02-28 ENCOUNTER — Other Ambulatory Visit: Payer: Self-pay

## 2019-02-28 ENCOUNTER — Ambulatory Visit: Payer: 59 | Admitting: Internal Medicine

## 2019-02-28 ENCOUNTER — Encounter: Payer: Self-pay | Admitting: Internal Medicine

## 2019-02-28 VITALS — BP 142/96 | HR 61 | Temp 98.0°F | Resp 16 | Ht 66.5 in | Wt 272.0 lb

## 2019-02-28 DIAGNOSIS — E782 Mixed hyperlipidemia: Secondary | ICD-10-CM | POA: Insufficient documentation

## 2019-02-28 DIAGNOSIS — G4733 Obstructive sleep apnea (adult) (pediatric): Secondary | ICD-10-CM | POA: Insufficient documentation

## 2019-02-28 DIAGNOSIS — Z6841 Body Mass Index (BMI) 40.0 and over, adult: Secondary | ICD-10-CM | POA: Insufficient documentation

## 2019-02-28 DIAGNOSIS — Z9884 Bariatric surgery status: Secondary | ICD-10-CM | POA: Insufficient documentation

## 2019-02-28 DIAGNOSIS — E039 Hypothyroidism, unspecified: Secondary | ICD-10-CM | POA: Insufficient documentation

## 2019-02-28 DIAGNOSIS — R232 Flushing: Secondary | ICD-10-CM | POA: Insufficient documentation

## 2019-02-28 DIAGNOSIS — E7849 Other hyperlipidemia: Secondary | ICD-10-CM

## 2019-02-28 DIAGNOSIS — E079 Disorder of thyroid, unspecified: Secondary | ICD-10-CM | POA: Insufficient documentation

## 2019-02-28 DIAGNOSIS — G4700 Insomnia, unspecified: Secondary | ICD-10-CM

## 2019-02-28 DIAGNOSIS — R030 Elevated blood-pressure reading, without diagnosis of hypertension: Secondary | ICD-10-CM | POA: Insufficient documentation

## 2019-02-28 MED ORDER — VITAMIN D (ERGOCALCIFEROL) 1.25 MG (50000 UNIT) PO CAPS: ORAL_CAPSULE | ORAL | 3 refills | Status: DC

## 2019-02-28 MED ORDER — ZOLPIDEM TARTRATE ER 12.5 MG PO TBCR: EXTENDED_RELEASE_TABLET | ORAL | 2 refills | Status: DC

## 2019-02-28 MED ORDER — PAROXETINE HCL 10 MG PO TABS: 10.0000 mg | ORAL_TABLET | Freq: Every day | ORAL | 5 refills | Status: DC

## 2019-02-28 NOTE — Progress Notes (Signed)
S - Patient presents to discuss Vit D and Vit B supplements. Mary [name] had gastric bypass in her past and has been on supplemetnation since and I reviewed her chart at length and the levels from prior draws. Spent a lot of time discussing this with her during the visit. Mary [name] notes Mary [name] has been feeling well in the past months and thinks when here levels were low, Mary [name] was not feeling well, very fatigued and likely related. Mary [name] is n ow walking 5X/week, a little less than a mile and trying to watch her diet better and was disappointed when her weight was not down today.  Mary [name] notes Mary [name] struggles sleeping at nights, had sleep apnea in past and sent the CPAP prescribed back as not tolerate the mask device at night at all. Still snores regularly. Wakes up yawning. Mary [name] also has tried to half the [place] and then awakens in middle of night and tries to take melatonin then and not help. Also notes waking up with night sweats again and Paxil was prescribed for this by the doctor here prior and helped and now is out of that med and would like refilled.  Mary [name] denies any recent CP's, palp's, SOB, the LE swelling Mary [name] thinks is better in recent past. No h/o HTN where Mary [name] needed medicines to treat in her past. .    Lactose intolerant hx Former smoker, quit 2004  Allergies  Allergen Reactions  . Bee Venom Anaphylaxis  . Citalopram Nausea Only  . Lactose Intolerance (Gi) Diarrhea    Bloating, upset stomach  . Tape Other (See Comments)    Thin skin, rips off skin      Current Outpatient Medications on File Prior to Visit  Medication Sig Dispense Refill  . EPINEPHrine (EPIPEN 2-PAK) 0.3 mg/0.3 mL IJ SOAJ injection Inject 0.3 mLs (0.3 mg total) into the muscle as needed for anaphylaxis. 1 each 1  . Lactase (LACTAID FAST ACT) 9000 units TABS Take 9,000 Units by mouth as needed (consuming dairy).    Marland Kitchen levothyroxine (SYNTHROID, LEVOTHROID) 100 MCG tablet Take 100 mcg by mouth daily before breakfast.    . PARoxetine (PAXIL) 10  MG tablet TAKE 1 TABLET BY MOUTH EVERY DAY AT BEDTIME (Patient taking differently: Take 10 mg by mouth at bedtime as needed. ) 30 tablet 6  . Vitamin D, Ergocalciferol, (DRISDOL) 50000 UNITS CAPS capsule Take 50,000 Units by mouth every Monday, Wednesday, and Friday.     . zolpidem (AMBIEN CR) 12.5 MG CR tablet Take 12.5 mg by mouth at bedtime.     Current Facility-Administered Medications on File Prior to Visit  Medication Dose Route Frequency Provider Last Rate Last Dose  . cyanocobalamin ((VITAMIN B-12)) injection 1,000 mcg  1,000 mcg Intramuscular Q30 days Towanda Malkin, MD   1,000 mcg at 02/01/19 1437     O - NAD, obese, masked    BP (!) 142/96 (BP Location: Left Arm, Patient Position: Sitting, Cuff Size: Large)   Pulse 61   Temp 98 F (36.7 C) (Oral)   Resp 16   Ht 5' 6.5" (1.689 m)   Wt 272 lb (123.4 kg)   SpO2 98%   BMI 43.24 kg/m   Recheck - 143/87 with larger cuff needed (was 132/80 in Feb visit)  HEENT - sclera anicteric Car - RRR without m/g/r Pulm - CTA Abd - obese, NT Ext - 1+ LE edema Neuro - Affect not flat, approp with conversation   Last labs reviewed - Vit D level -  42.1, B12 - 1422, TSH 5,87 on June 2020 check. Prior Vit D levels were 52 June 2019, 39.15 Jul 2018. Prior B12 levels 848 Jan 2020, and 994 June 2019.  Last TSH was 3.38 June 2019. June 2019 was not anemic and normal MCV and MCH.   A/P - 1. Obesity - s/p gastric bypass > 10 years ago and on Vit D and Vit B supplementation and has felt much better since supplements increased and levels better. On high dose Vit D supplementation presently (50,000IU 3X/week)  Noted the potential need for higher dose supplementation with her bypass history and affects on absorptive capacity, and also noted the current defiinitions of Vit D def and insuff, and goals to keep her Vit D above 30 reasonable. The Vit B also was supratherapeutic on last check and has injections twice monthly. Agreed to use Vit D 50,000  IU weekly at present (reduced from three times weekly) and await recheck of levels before years end (feel Dec reasonable) Also agreed to reduce Vit B injections to monthly and recheck levels again with Vit D check Cont with more activity, walking 5X/week encouraged to continue and also diet modifications as has been trying to do very recent past to help with weight loss.  2. Increased BP - concern not as well controlled recent past   Discussed goals for good control of BP  Importance of healthy diet and regular aerobic exercise and weight control noted Noted untreated sleep apnea can increase this risk as well Will continue to monitor presently, and amy need medicine over time to help manage pending status on f/u checks  3. Insomnia   Discussed concerns in the past for high dose of Ambien and strongly encouraged to try tl half doses and concerns with ability to get over time thru insurances Would try the melatonin when goes to bed, not when awakens in middle of night to try to help. Sleep hygiene measures discussed  4. Sleep apnea - noted was diagnosed with sleep study but not tolerate the CPAP and returned it  Discussed concerns with untreated sleep apnea, and probably affecting her sleep as well as next day fatigue concerns Discussed other measures can try to treat, are surgical interventions can try, other ways to get the pressure needed delivered and a referral to a sleep specialist can help with this. Mary [name] will let me know if desires to pursue  5. Hot flashes overnight - Paxil prescribed in the past for this and helpful and ok to refill presently. Again trying to lessen in the future would be good and assess if med still needed over time   6. Hypothyroid - on supplement  Last TSH slightly higher and felt best to recheck level again in 2-3 months and Mary [name] will get that checked today. Await result and continue current dose (Mary [name] noted on this dose for 20 + years)  7. Hyperlipidemia - TC  226, LDL - 141 on last check   Diet and exercise increase rec'ed prior and not feel an additional med best presently to help like a statin  Mary [name] inquired about a bone density scan and when rec'ed, and I noted often at age 19 unless more concerns arise to get sooner. Her recent fracture was due to trauma. Can try to get sooner than 65 if more concerned, possibly at age 73 mentioned.

## 2019-03-01 LAB — TSH: TSH: 2.96 u[IU]/mL (ref 0.450–4.500)

## 2019-03-01 NOTE — Progress Notes (Signed)
Sent this response to patient with the lab result. Mary [name], Good news. The TSH was normal. No changes needed in the thyroid medication presently. Dr. Roxan Hockey

## 2019-04-05 ENCOUNTER — Ambulatory Visit: Payer: 59

## 2019-04-05 ENCOUNTER — Other Ambulatory Visit: Payer: Self-pay

## 2019-04-05 ENCOUNTER — Telehealth: Payer: Self-pay

## 2019-04-05 DIAGNOSIS — E538 Deficiency of other specified B group vitamins: Secondary | ICD-10-CM

## 2019-04-05 NOTE — Telephone Encounter (Signed)
Keerstin called earlier today & said when she picked up Rx for Ambien last mont (08/20), the pharmacy only gave her 15 tablets.  Called [facility] [phone]) & spoke with Anoma for prior authorization for Zolpidem (Ambien CR 12.5 mg) Qty of 30 per month.  3 year approval received starting today. 04/05/2019 - 04/04/2022  Notified [facility] [id number]) that prior Authorization approved 30 tabs/month for 3 years starting today.  [facility] said they will process refill for patient to pick up.  AMD

## 2019-05-03 ENCOUNTER — Other Ambulatory Visit: Payer: Self-pay

## 2019-05-03 ENCOUNTER — Ambulatory Visit: Payer: Self-pay

## 2019-05-10 ENCOUNTER — Ambulatory Visit: Payer: 59

## 2019-06-10 ENCOUNTER — Ambulatory Visit: Payer: Self-pay

## 2019-06-11 ENCOUNTER — Other Ambulatory Visit: Payer: Self-pay

## 2019-06-11 ENCOUNTER — Ambulatory Visit: Payer: 59

## 2019-06-11 DIAGNOSIS — E538 Deficiency of other specified B group vitamins: Secondary | ICD-10-CM

## 2019-07-09 ENCOUNTER — Ambulatory Visit: Payer: 59

## 2019-07-16 ENCOUNTER — Other Ambulatory Visit: Payer: Self-pay

## 2019-07-16 ENCOUNTER — Encounter: Payer: Self-pay | Admitting: Occupational Medicine

## 2019-07-16 ENCOUNTER — Ambulatory Visit: Payer: 59 | Admitting: Occupational Medicine

## 2019-07-16 VITALS — BP 160/100 | HR 65 | Temp 99.0°F | Resp 14 | Ht 66.0 in | Wt 278.0 lb

## 2019-07-16 DIAGNOSIS — M545 Low back pain, unspecified: Secondary | ICD-10-CM | POA: Insufficient documentation

## 2019-07-16 DIAGNOSIS — Z87898 Personal history of other specified conditions: Secondary | ICD-10-CM | POA: Insufficient documentation

## 2019-07-16 DIAGNOSIS — N760 Acute vaginitis: Secondary | ICD-10-CM

## 2019-07-16 DIAGNOSIS — E559 Vitamin D deficiency, unspecified: Secondary | ICD-10-CM | POA: Insufficient documentation

## 2019-07-16 DIAGNOSIS — R609 Edema, unspecified: Secondary | ICD-10-CM | POA: Insufficient documentation

## 2019-07-16 DIAGNOSIS — M1990 Unspecified osteoarthritis, unspecified site: Secondary | ICD-10-CM | POA: Insufficient documentation

## 2019-07-16 MED ORDER — FLUCONAZOLE 150 MG PO TABS: 150.0000 mg | ORAL_TABLET | Freq: Once | ORAL | 1 refills | Status: AC

## 2019-07-16 NOTE — Progress Notes (Signed)
  Subjective:     Patient ID: Mary [name], female   DOB: 02-23-1961, 59 y.o.   MRN: [id number]  HPI patient is a very pleasant employee of [employer] [place].  She is going to have a dental procedure in the next few days and has been on an antibiotic.  Since starting the antibiotic a few days ago she has developed tenderness, itching, and discharge of the vaginal area.  No reports of vaginal bleeding, fever, pelvic pain, or other symptoms.  This feels like her usual yeast infection.  Last 1 she had while taking a antibiotic was a couple of years ago.  It resolved with Diflucan.   Review of Systems    No fever, pelvic pain, low back pain.  Positive for vaginal itching, tenderness, and discharge. Objective:   Physical Exam    Deferred. Assessment:     Vaginal yeast infection    Plan:     We will call in Diflucan 1 tablet as needed for yeast infection.  1 refill given.  No planned follow-up in this clinic.  Advised patient that if symptoms do not resolve with Diflucan, then she should follow-up with her OB/GYN for further evaluation.

## 2019-07-16 NOTE — Progress Notes (Signed)
Taking ABX prescribed by dentist for upcoming dental procedure (Thursday 07/18/19).  States every time she takes ABX she gets a yeast infection.  Informed her dentist & he advised her to get Diflucan from her PCP which is our clinic.  Amoxicilln 500 mg tid x10 days & started last Wednesday.  Last Diflucan prescribed by  Dr. Corinda Gubler 07/2016 when he was prescribing ABX for URI.   Stress elevated -  1.Work training going on today. 2. Dental procedure 3. [place] family member tested positive for covid  Symptoms - vagina itching, burning when wipes.  No discharge.  AMD

## 2019-08-01 DIAGNOSIS — D2271 Melanocytic nevi of right lower limb, including hip: Secondary | ICD-10-CM | POA: Diagnosis not present

## 2019-08-01 DIAGNOSIS — D2272 Melanocytic nevi of left lower limb, including hip: Secondary | ICD-10-CM | POA: Diagnosis not present

## 2019-08-01 DIAGNOSIS — D2262 Melanocytic nevi of left upper limb, including shoulder: Secondary | ICD-10-CM | POA: Diagnosis not present

## 2019-08-01 DIAGNOSIS — D2261 Melanocytic nevi of right upper limb, including shoulder: Secondary | ICD-10-CM | POA: Diagnosis not present

## 2019-08-01 DIAGNOSIS — L570 Actinic keratosis: Secondary | ICD-10-CM | POA: Diagnosis not present

## 2019-08-01 DIAGNOSIS — Z85828 Personal history of other malignant neoplasm of skin: Secondary | ICD-10-CM | POA: Diagnosis not present

## 2019-08-01 DIAGNOSIS — L821 Other seborrheic keratosis: Secondary | ICD-10-CM | POA: Diagnosis not present

## 2019-08-02 ENCOUNTER — Ambulatory Visit: Payer: 59

## 2019-08-21 ENCOUNTER — Other Ambulatory Visit: Payer: Self-pay | Admitting: Internal Medicine

## 2019-08-23 ENCOUNTER — Other Ambulatory Visit: Payer: Self-pay

## 2019-08-23 ENCOUNTER — Ambulatory Visit: Payer: 59

## 2019-08-23 DIAGNOSIS — E538 Deficiency of other specified B group vitamins: Secondary | ICD-10-CM

## 2019-08-23 MED ORDER — CYANOCOBALAMIN 1000 MCG/ML IJ SOLN
1000.0000 ug | Freq: Once | INTRAMUSCULAR | Status: AC
  Administered 2019-08-23: 1000 ug via INTRAMUSCULAR

## 2019-08-23 NOTE — Progress Notes (Signed)
Patient comes in today for a vitamin B12 injection. Administered in Right deltoid IM.

## 2019-08-23 NOTE — Telephone Encounter (Signed)
Extended conversation with patient - Reinitiate daily walking program  30 min brisk Reduce caffeine intake and increase water  Contact sleep study previously seen or we can initiate new contact to re-evaluate whether new interventions might be beneficial  Discussed difficulty with weight loss, BP, day fatigue when not sleeping well. Medicated sleep not as beneficial as natural cycling Will OK Ambien 6.25mg  try to taper  Anxious about husband new retirement- she works at home 3 days a week. Commit to walking as he is also overweight.  New plans for 2021     " If nothing changes, nothing changes"

## 2019-09-06 ENCOUNTER — Ambulatory Visit: Payer: Self-pay

## 2019-09-06 ENCOUNTER — Other Ambulatory Visit: Payer: Self-pay

## 2019-09-06 DIAGNOSIS — E538 Deficiency of other specified B group vitamins: Secondary | ICD-10-CM

## 2019-09-06 MED ORDER — CYANOCOBALAMIN 1000 MCG/ML IJ SOLN
1000.0000 ug | Freq: Once | INTRAMUSCULAR | Status: AC
  Administered 2019-09-06: 16:00:00 1000 ug via INTRAMUSCULAR

## 2019-09-06 NOTE — Progress Notes (Signed)
Patient comes in today for vitamin B 12 injection. Administered in Left deltoid IM.

## 2019-09-16 ENCOUNTER — Telehealth: Payer: Self-pay

## 2019-09-16 DIAGNOSIS — Z1231 Encounter for screening mammogram for malignant neoplasm of breast: Secondary | ICD-10-CM

## 2019-09-16 NOTE — Telephone Encounter (Signed)
BIRADS 10 Apr 2018 per epic review.  New order  Placed for patient routine mammogram [facility].  Please let me know if she preferred a different location.

## 2019-09-16 NOTE — Telephone Encounter (Signed)
Mary [name]'s last labs & physical was 12/2016 with Dr. Corinda Gubler. She had an office visit 03/10/2019 with Dr. Roxan Hockey. She had an office visit 07/16/19 with Dr. Geoffry Paradise.  Labs scheduled for 10/01/19 & physical scheduled 10/09/19.  She called because she needs an order to get her mammogram scheduled.  She's trying to get her mammogram before she gets her covid vaccine.  Can you put in an order for a mammogram?  Last one was 05/09/18.  AMD

## 2019-09-19 ENCOUNTER — Ambulatory Visit
Admission: RE | Admit: 2019-09-19 | Discharge: 2019-09-19 | Disposition: A | Discharge status: Home / Self Care | Payer: 59 | Source: Ambulatory Visit | Attending: Registered Nurse | Admitting: Registered Nurse

## 2019-09-19 DIAGNOSIS — Z1231 Encounter for screening mammogram for malignant neoplasm of breast: Secondary | ICD-10-CM | POA: Diagnosis not present

## 2019-09-20 ENCOUNTER — Other Ambulatory Visit: Payer: Self-pay

## 2019-09-20 ENCOUNTER — Ambulatory Visit: Payer: Self-pay

## 2019-09-20 DIAGNOSIS — E538 Deficiency of other specified B group vitamins: Secondary | ICD-10-CM

## 2019-09-20 MED ORDER — CYANOCOBALAMIN 1000 MCG/ML IJ SOLN
1000.0000 ug | Freq: Once | INTRAMUSCULAR | Status: AC
  Administered 2019-09-20: 1000 ug via INTRAMUSCULAR

## 2019-09-20 NOTE — Progress Notes (Signed)
Patient comes in today for a vitamin B 12 injection. Administered in Right deltoid IM. Patient tolerated well.

## 2019-09-30 NOTE — Progress Notes (Signed)
Scheduled to complete physical 10/09/2019 with Gerarda Fraction, NP-C.  AMD

## 2019-10-01 ENCOUNTER — Other Ambulatory Visit: Payer: Self-pay

## 2019-10-01 ENCOUNTER — Ambulatory Visit: Payer: Self-pay

## 2019-10-01 DIAGNOSIS — Z0000 Encounter for general adult medical examination without abnormal findings: Secondary | ICD-10-CM

## 2019-10-01 LAB — POCT URINALYSIS DIPSTICK
Bilirubin, UA: NEGATIVE
Blood, UA: NEGATIVE
Glucose, UA: NEGATIVE
Ketones, UA: NEGATIVE
Leukocytes, UA: NEGATIVE
Nitrite, UA: NEGATIVE
Protein, UA: NEGATIVE
Spec Grav, UA: 1.01 (ref 1.010–1.025)
Urobilinogen, UA: 0.2 E.U./dL
pH, UA: 6 (ref 5.0–8.0)

## 2019-10-02 LAB — CMP12+LP+TP+TSH+6AC+CBC/D/PLT
ALT: 16 IU/L (ref 0–32)
AST: 19 IU/L (ref 0–40)
Albumin/Globulin Ratio: 1.8 (ref 1.2–2.2)
Albumin: 4.4 g/dL (ref 3.8–4.9)
Alkaline Phosphatase: 128 IU/L — ABNORMAL HIGH (ref 39–117)
BUN/Creatinine Ratio: 16 (ref 9–23)
BUN: 13 mg/dL (ref 6–24)
Basophils Absolute: 0.1 10*3/uL (ref 0.0–0.2)
Basos: 1 %
Bilirubin Total: 0.3 mg/dL (ref 0.0–1.2)
Calcium: 9.5 mg/dL (ref 8.7–10.2)
Chloride: 102 mmol/L (ref 96–106)
Chol/HDL Ratio: 3.5 ratio (ref 0.0–4.4)
Cholesterol, Total: 259 mg/dL — ABNORMAL HIGH (ref 100–199)
Creatinine, Ser: 0.8 mg/dL (ref 0.57–1.00)
EOS (ABSOLUTE): 0.1 10*3/uL (ref 0.0–0.4)
Eos: 1 %
Estimated CHD Risk: 0.5 times avg. (ref 0.0–1.0)
Free Thyroxine Index: 3.1 (ref 1.2–4.9)
GFR calc Af Amer: 94 mL/min/{1.73_m2} (ref >59)
GFR calc non Af Amer: 82 mL/min/{1.73_m2} (ref >59)
GGT: 12 IU/L (ref 0–60)
Globulin, Total: 2.5 g/dL (ref 1.5–4.5)
Glucose: 98 mg/dL (ref 65–99)
HDL: 75 mg/dL (ref >39)
Hematocrit: 45.3 % (ref 34.0–46.6)
Hemoglobin: 14.7 g/dL (ref 11.1–15.9)
Immature Grans (Abs): 0 10*3/uL (ref 0.0–0.1)
Immature Granulocytes: 0 %
Iron: 104 ug/dL (ref 27–159)
LDH: 194 IU/L (ref 119–226)
LDL Chol Calc (NIH): 166 mg/dL — ABNORMAL HIGH (ref 0–99)
Lymphocytes Absolute: 1.6 10*3/uL (ref 0.7–3.1)
Lymphs: 29 %
MCH: 27 pg (ref 26.6–33.0)
MCHC: 32.5 g/dL (ref 31.5–35.7)
MCV: 83 fL (ref 79–97)
Monocytes Absolute: 0.4 10*3/uL (ref 0.1–0.9)
Monocytes: 8 %
Neutrophils Absolute: 3.4 10*3/uL (ref 1.4–7.0)
Neutrophils: 61 %
Phosphorus: 3.5 mg/dL (ref 3.0–4.3)
Platelets: 184 10*3/uL (ref 150–450)
Potassium: 4.7 mmol/L (ref 3.5–5.2)
RBC: 5.44 x10E6/uL — ABNORMAL HIGH (ref 3.77–5.28)
RDW: 15.8 % — ABNORMAL HIGH (ref 11.7–15.4)
Sodium: 139 mmol/L (ref 134–144)
T3 Uptake Ratio: 26 % (ref 24–39)
T4, Total: 11.9 ug/dL (ref 4.5–12.0)
TSH: 4.61 u[IU]/mL — ABNORMAL HIGH (ref 0.450–4.500)
Total Protein: 6.9 g/dL (ref 6.0–8.5)
Triglycerides: 103 mg/dL (ref 0–149)
Uric Acid: 4.8 mg/dL (ref 3.0–7.2)
VLDL Cholesterol Cal: 18 mg/dL (ref 5–40)
WBC: 5.5 10*3/uL (ref 3.4–10.8)

## 2019-10-02 LAB — VITAMIN D 25 HYDROXY (VIT D DEFICIENCY, FRACTURES): Vit D, 25-Hydroxy: 33.7 ng/mL (ref 30.0–100.0)

## 2019-10-02 LAB — B12 AND FOLATE PANEL
Folate: 12.1 ng/mL (ref >3.0)
Vitamin B-12: 796 pg/mL (ref 232–1245)

## 2019-10-04 ENCOUNTER — Other Ambulatory Visit: Payer: Self-pay

## 2019-10-04 ENCOUNTER — Ambulatory Visit: Payer: Self-pay

## 2019-10-04 DIAGNOSIS — E538 Deficiency of other specified B group vitamins: Secondary | ICD-10-CM

## 2019-10-04 MED ORDER — CYANOCOBALAMIN 1000 MCG/ML IJ SOLN
1000.0000 ug | Freq: Once | INTRAMUSCULAR | Status: AC
  Administered 2019-10-18: 1000 ug via INTRAMUSCULAR

## 2019-10-04 NOTE — Progress Notes (Signed)
Here for B-12 injection. S/p gastric bypass surgery.

## 2019-10-08 DIAGNOSIS — E538 Deficiency of other specified B group vitamins: Secondary | ICD-10-CM | POA: Insufficient documentation

## 2019-10-08 NOTE — Progress Notes (Signed)
Labs & EKG completed 10/01/19.  AMD

## 2019-10-09 ENCOUNTER — Ambulatory Visit: Payer: Self-pay | Admitting: Registered Nurse

## 2019-10-09 ENCOUNTER — Encounter: Payer: Self-pay | Admitting: Registered Nurse

## 2019-10-09 ENCOUNTER — Other Ambulatory Visit: Payer: Self-pay

## 2019-10-09 VITALS — BP 134/91 | HR 92 | Temp 98.3°F | Ht 66.5 in | Wt 280.0 lb

## 2019-10-09 DIAGNOSIS — E538 Deficiency of other specified B group vitamins: Secondary | ICD-10-CM

## 2019-10-09 DIAGNOSIS — E559 Vitamin D deficiency, unspecified: Secondary | ICD-10-CM

## 2019-10-09 DIAGNOSIS — G4700 Insomnia, unspecified: Secondary | ICD-10-CM

## 2019-10-09 DIAGNOSIS — R030 Elevated blood-pressure reading, without diagnosis of hypertension: Secondary | ICD-10-CM

## 2019-10-09 DIAGNOSIS — Z6841 Body Mass Index (BMI) 40.0 and over, adult: Secondary | ICD-10-CM

## 2019-10-09 DIAGNOSIS — Z0000 Encounter for general adult medical examination without abnormal findings: Secondary | ICD-10-CM

## 2019-10-09 DIAGNOSIS — E785 Hyperlipidemia, unspecified: Secondary | ICD-10-CM

## 2019-10-09 DIAGNOSIS — R748 Abnormal levels of other serum enzymes: Secondary | ICD-10-CM

## 2019-10-09 DIAGNOSIS — E079 Disorder of thyroid, unspecified: Secondary | ICD-10-CM

## 2019-10-09 MED ORDER — ZOLPIDEM TARTRATE ER 12.5 MG PO TBCR: EXTENDED_RELEASE_TABLET | ORAL | 0 refills | Status: DC

## 2019-10-09 NOTE — Progress Notes (Signed)
Subjective:    Patient ID: Mary [name], female    DOB: 09-01-1960, 59 y.o.   MRN: [id number]  58y/o caucasian married female established patient here for annual physical. Taking her thyroid medication at same time every day and waits to eat for an hour but does sometimes have coffee prior to eating breakfast.  Has had this routine for years.  Denied dietary supplements OTC or energy/herbal drink use.Since that visit  patient reported she has been working from home since everything shut down for covid pandemic Mar 2020 not getting as much walking in.  Stopped walking in the ams with her husband and has had weight gain.  Saw PA Truman Hayward and Dr Roxan Hockey and they counseled her to cut down on ambien use.  She has decreased to 1/4 to 1/2 tab with unisom OTC.  Unisom doesn't have her sleep the entire night and waking up 0300-0400.  Sleeps in separate room from spouse.  Last week downloaded myfitnesspal app and started wearing her garmin fitness watch again.  Logging intake goal 1560 or less calories per day.  Steps on garmin show 2-3K per day for the past week.  Patient with history of gastric bypass and hysterectomy.  Heat/hot weather makes her feel bad. Thinks she has POTS like her daughter as hot weather/dizzy especially lying down then going back to standing in hot weather results in dizziness. Wears compression hose for leg swelling and has seen vascular surgery in the past and measured for her socks/buys annually and has decreased in size over time with consistent wear less leg swelling.  Sometimes has to get up at night to pee.  Noted some bruising easily chronic.  Denied hair loss, rashes, difficulty concentrating, constipation.  She used to donate platelets but then had anemia/low iron and got sick bad pneumonia shortly after that approx 2015 and quit donating blood/platelets.  Total hysterectomy no menses since that time years ago.   Last physical 12/28/2017 with Dr Cheryll Cockayne ht 5.6 266lbs and BP 130/88  tdap 2018 ua 1+ billi otherwise normal colonscopy due 2018.  Hot flashes using paxil, hypothyroidism on levothyroxine 157mcg po qam, insomnia ambien CR working and buspar at bedtime; vitamin D deficiency 52 on high dose supplement; lipids--improve diet; b12 deficiency continue every 2 week injections level 994; weight loss phentermine x 4 months still working.  referred for colonoscopy with [facility]  Previous office visit 09/14/2017 weight 275.6 BP 126/82 for 3 month f/u Vit D deficiency 50000 units 3x/wk level improved 36, B12 deficiency injections q2 weeks level improved 575 , fatigue mild improvement consider sleep study refused, obesity start phentermine and exercise and LE edema 2+ today pitting needs refill compression stockings Dr Roxan Hockey OV wt 273.8 lbs BP 132/80  08/30/2018 insomnia/obesity/anxiety addressed signed CSA, discussed phentermine short term use 1-3 months only, try to decrease dose ambien patient concerned husband retiring, continue exercise and diet modification.  [place] paper chart review:  AETNA authorization Ambien CR 12.5mg  po 1/2 - 1 po qhs prn insomnia #30 RF0 approved for 36 months 03/2019-04/04/2022 per note from [place]; Lorrin Mais 10mg  po qhs prn 2014-12/2016 increased dose; ambien decreased to 5mg  07/2012, xanax, premarin 0.3mg , cyclobenzaprine 5mg  2014; premarin 0.15mg  daily;  Rx hx phentermine 37.5mg  09/2017-12/2017 and 07/2018; paroxitine 10mg  qhs prn 2017-07/2017 refilled 2020 #90; buspirone 10mg  2 tabs hs prn 07/2017 dc'd 10mg  qhs prn 05/2018- last Rx Dr Roxan Hockey 12/2018 #90 RF3; levoxyl 100mg  po qam 2008-04/2018; vitamin D 50000 units 3x/week 06/2017 to  present prior was 2x/wk 03/2017 once a week 12/2016; b12 1000mg  IM q2 weeks 06/2017 to present; ambien 12.5mg  CR qhs prn 05/2017-11/2018; tramadol 50mg  po tid prn 2014; epipen prn 12/2017; prilosec, lasix, diltiazem ER 180 daily 2012 increased to 240mg  2014; PMHx obesity, sleep apnea hasn't used machine in  years mask didn't fit well; elevated LDL, low vitamin D, low iron; L4-L5 spondylolithesis and injections facet joint hx of svt, ddd lumbar thoracic; edema venous disease, hypothyroidism, insomnia, hip pain, back pain, hot flashes, vitamin D deficiency, elevated lipids, vitamin B12 deficiency; lymphedema 2014;  repeat chest CT 06/2014 5mm lung nodule right ?resolving pneumonia lingula, brain MRI 02/2014 for dizziness/loss of balance essentially normal, CT lumbar disk bulges noted throughout levels and right thoracic DDD advanced for age thoracic disc bulges/osteophytes/end plate degeneration, pelvic mass 02/2014; PSHx s/p gastric bypass 06/2006? L4-L5 fusion 06/2013, venous statis surgery 2014, PLIF 07/10/13, salpingooophorectomy 04/2014 (pelvic mass) colonoscopy 2019 due 5 years 2024 diverticula and internal grade I hemorrhoids found Lab hx: total cholesterol [phone] (2019 226) Trigs 63-179 (2019 77) LDL 131-190 HDL 49-84 Glucose 78-113 (2020 91) Vit D 7.3-52 (2020 42 2019 52) B12 [phone] (2020 1422; 2019 994) TSH 1.93-5.8 (2020 5.8; 2019 3.38) Ferritin 8 2015 9 2016     Review of Systems  Constitutional: Positive for activity change. Negative for appetite change, chills, diaphoresis, fatigue, fever and unexpected weight change.  HENT: Negative for trouble swallowing and voice change.   Eyes: Negative for photophobia and visual disturbance.  Respiratory: Negative for cough, shortness of breath, wheezing and stridor.   Cardiovascular: Positive for leg swelling. Negative for chest pain and palpitations.  Gastrointestinal: Negative for abdominal pain, diarrhea, nausea and vomiting.  Endocrine: Positive for heat intolerance. Negative for cold intolerance.  Genitourinary: Negative for difficulty urinating.  Musculoskeletal: Negative for gait problem, myalgias, neck pain and neck stiffness.  Skin: Negative for rash.  Allergic/Immunologic: Positive for environmental allergies, food  allergies and immunocompromised state.  Neurological: Positive for dizziness and light-headedness. Negative for tremors, seizures, syncope, facial asymmetry, speech difficulty, weakness, numbness and headaches.  Hematological: Negative for adenopathy. Does not bruise/bleed easily.  Psychiatric/Behavioral: Positive for sleep disturbance. Negative for agitation and confusion.       Objective:   Physical Exam Vitals and nursing note reviewed.  Constitutional:      General: She is awake. She is not in acute distress.    Appearance: Normal appearance. She is well-developed and well-groomed. She is morbidly obese. She is not ill-appearing, toxic-appearing or diaphoretic.  HENT:     Head: Normocephalic and atraumatic.     Jaw: There is normal jaw occlusion. No trismus.     Salivary Glands: Right salivary gland is not diffusely enlarged or tender. Left salivary gland is not diffusely enlarged or tender.     Right Ear: Hearing and external ear normal.     Left Ear: Hearing and external ear normal.     Nose: Nose normal. No nasal deformity, septal deviation, laceration or mucosal edema.     Right Sinus: No maxillary sinus tenderness or frontal sinus tenderness.     Left Sinus: No maxillary sinus tenderness or frontal sinus tenderness.     Mouth/Throat:     Mouth: Mucous membranes are not pale, not dry and not cyanotic. No angioedema.     Pharynx: Oropharynx is clear.  Eyes:     General: Lids are normal. Vision grossly intact. Gaze aligned appropriately. No allergic shiner, visual field deficit or scleral icterus.  Right eye: No foreign body, discharge or hordeolum.        Left eye: No foreign body, discharge or hordeolum.     Extraocular Movements: Extraocular movements intact.     Right eye: Normal extraocular motion and no nystagmus.     Left eye: Normal extraocular motion and no nystagmus.     Conjunctiva/sclera: Conjunctivae normal.     Right eye: Right conjunctiva is not injected. No  chemosis, exudate or hemorrhage.    Left eye: Left conjunctiva is not injected. No chemosis, exudate or hemorrhage.    Pupils: Pupils are equal, round, and reactive to light. Pupils are equal.     Right eye: Pupil is round and reactive.     Left eye: Pupil is round and reactive.  Neck:     Thyroid: No thyroid mass, thyromegaly or thyroid tenderness.     Trachea: Trachea normal. No tracheal tenderness or tracheal deviation.  Cardiovascular:     Rate and Rhythm: Normal rate and regular rhythm.     Chest Wall: PMI is not displaced.     Pulses: Normal pulses.          Radial pulses are 2+ on the right side and 2+ on the left side.     Heart sounds: Normal heart sounds, S1 normal and S2 normal. Heart sounds not distant. No murmur. No friction rub. No gallop.   Pulmonary:     Effort: Pulmonary effort is normal. No accessory muscle usage or respiratory distress.     Breath sounds: Normal breath sounds and air entry. No stridor, decreased air movement or transmitted upper airway sounds. No decreased breath sounds, wheezing, rhonchi or rales.     Comments: Wearing cloth mask due to covid 19 pandemic; spoke full sentences without difficulty; no cough observed in exam room Chest:     Chest wall: No tenderness.  Abdominal:     General: Abdomen is flat. Bowel sounds are normal. There is no distension or abdominal bruit. There are no signs of injury.     Palpations: Abdomen is soft. There is no shifting dullness, fluid wave, hepatomegaly, splenomegaly, mass or pulsatile mass.     Tenderness: There is no abdominal tenderness. There is no right CVA tenderness, left CVA tenderness, guarding or rebound. Negative signs include Murphy's sign.     Comments: Dull to percussion x 4 quads  Musculoskeletal:        General: Swelling present. No tenderness, deformity or signs of injury. Normal range of motion.     Right shoulder: Normal.     Left shoulder: Normal.     Right elbow: Normal.     Left elbow: Normal.      Right hand: Normal.     Left hand: Normal.     Cervical back: Normal, normal range of motion and neck supple. No swelling, edema, deformity, erythema, signs of trauma, lacerations, rigidity, spasms, torticollis, tenderness, bony tenderness or crepitus. No pain with movement, spinous process tenderness or muscular tenderness. Normal range of motion.     Thoracic back: Normal.     Lumbar back: Normal.     Right hip: Normal.     Left hip: Normal.     Right knee: Normal.     Left knee: Normal.     Right lower leg: 1+ Edema present.     Left lower leg: 1+ Edema present.  Lymphadenopathy:     Head:     Right side of head: No submental, submandibular, tonsillar, preauricular, posterior  auricular or occipital adenopathy.     Left side of head: No submental, submandibular, tonsillar, preauricular, posterior auricular or occipital adenopathy.     Cervical: No cervical adenopathy.     Right cervical: No superficial, deep or posterior cervical adenopathy.    Left cervical: No superficial, deep or posterior cervical adenopathy.  Skin:    General: Skin is warm and dry.     Capillary Refill: Capillary refill takes less than 2 seconds.     Coloration: Skin is not ashen, cyanotic, jaundiced, mottled, pale or sallow.     Findings: No abrasion, abscess, acne, bruising, burn, ecchymosis, erythema, signs of injury, laceration, lesion, petechiae, rash or wound.     Nails: There is no clubbing.  Neurological:     General: No focal deficit present.     Mental Status: She is alert and oriented to person, place, and time. Mental status is at baseline. She is not disoriented.     GCS: GCS eye subscore is 4. GCS verbal subscore is 5. GCS motor subscore is 6.     Cranial Nerves: Cranial nerves are intact. No cranial nerve deficit, dysarthria or facial asymmetry.     Sensory: Sensation is intact. No sensory deficit.     Motor: Motor function is intact. No weakness, tremor, atrophy, abnormal muscle tone or  seizure activity.     Coordination: Coordination is intact. Coordination normal.     Gait: Gait is intact. Gait normal.     Comments: Bilateral hand grasp equal 5/5; on/off exam table and in/out of chair without difficulty; gait sure and steady in hallway  Psychiatric:        Attention and Perception: Attention and perception normal.        Mood and Affect: Mood and affect normal.        Speech: Speech normal.        Behavior: Behavior normal. Behavior is cooperative.        Thought Content: Thought content normal.        Cognition and Memory: Cognition and memory normal.        Judgment: Judgment normal.     Discussed results in detail with patient and compared to previous on file in epic and paper chart.  Printed copy of lab results and results note given to patient.  Normal urinalysis.  Please pull paper chart and place on my shelf for review and print copy of labs to discuss with patient at her appt next week. Keep follow up appt scheduled to discuss elevated TSH (thyroid hormone), cholesterol (total and LDL), RBC/RDW (red blood cells), and alkaline phosphatase (liver enzyme). I plan to give cholesterol, high fiber diet, red blood cells, hypothyroidism, liver function test handouts at appt. I recommend weight loss, exercise 150 minutes per week; dietary fiber daily by mouth 20 grams women per up to date; eat whole grains/fruits/vegetables; keep added sugars to less than 100 calories/ 5 teaspoons for women per American Heart Association; electrolytes, iron, kidney function, vitamin D, folate, B12 normal No infection, no anemia on CBC. My chart message sent to patient "Elizzie,  Keep follow up appt scheduled to discuss elevated TSH (thyroid hormone), cholesterol (total and LDL), RBC/RDW (red blood cells), and alkaline phosphatase (liver enzyme). I plan to give cholesterol, high fiber diet, red blood cells, hypothyroidism, liver function test handouts at appt. I need to review your paper chart  when I am onsite at [facility] clinic next week to see if these are new or chronic  findings for you. I did notice your TSH has been elevated previously and you are taking levothyroxine. If you are not taking at the same time every day we may need to increase your dose. Have you been having any symptoms e.g. trouble sleeping, weight gain, constipation, rashes, hair loss, trouble concentrating, bruising? Your cholesterol and alkaline phosphatase were previously elevated in 2007/2008 per epic review/[facility] labs. This can be from chronic disease, overweight, some medications.  I recommend weight loss, exercise 150 minutes per week; dietary fiber daily by mouth 20 grams women per up to date; eat whole grains/fruits/vegetables; keep added sugars to less than 100 calories/ 5 teaspoons for women per American Heart Association; electrolytes, iron, kidney function, vitamin D, folate, B12 normal No infection, no anemia on CBC. If you have questions please let me know. Sincerely, Gerarda Fraction NP-C" Results for CHYLA, MOHNEY (MRN [id number]) as of 10/09/2019 15:37  Ref. Range 10/01/2019 09:27 10/01/2019 11:11  Sodium Latest Ref Range: 134 - 144 mmol/L 139   Potassium Latest Ref Range: 3.5 - 5.2 mmol/L 4.7   Chloride Latest Ref Range: 96 - 106 mmol/L 102   Glucose Latest Ref Range: 65 - 99 mg/dL 98   BUN Latest Ref Range: 6 - 24 mg/dL 13   Creatinine Latest Ref Range: 0.57 - 1.00 mg/dL 0.80   Calcium Latest Ref Range: 8.7 - 10.2 mg/dL 9.5   BUN/Creatinine Ratio Latest Ref Range: 9 - 23  16   Phosphorus Latest Ref Range: 3.0 - 4.3 mg/dL 3.5   Alkaline Phosphatase Latest Ref Range: 39 - 117 IU/L 128 (H)   Albumin Latest Ref Range: 3.8 - 4.9 g/dL 4.4   Albumin/Globulin Ratio Latest Ref Range: 1.2 - 2.2  1.8   Uric Acid Latest Ref Range: 3.0 - 7.2 mg/dL 4.8   AST Latest Ref Range: 0 - 40 IU/L 19   ALT Latest Ref Range: 0 - 32 IU/L 16   Total Protein Latest Ref Range: 6.0 - 8.5 g/dL 6.9   Total  Bilirubin Latest Ref Range: 0.0 - 1.2 mg/dL 0.3   GGT Latest Ref Range: 0 - 60 IU/L 12   GFR, Est Non African American Latest Ref Range: >59 mL/min/1.73 82   GFR, Est African American Latest Ref Range: >59 mL/min/1.73 94   Estimated CHD Risk Latest Ref Range: 0.0 - 1.0 times avg. 0.5   LDH Latest Ref Range: 119 - 226 IU/L 194   Total CHOL/HDL Ratio Latest Ref Range: 0.0 - 4.4 ratio 3.5   Cholesterol, Total Latest Ref Range: 100 - 199 mg/dL 259 (H)   HDL Cholesterol Latest Ref Range: >39 mg/dL 75   Triglycerides Latest Ref Range: 0 - 149 mg/dL 103   VLDL Cholesterol Cal Latest Ref Range: 5 - 40 mg/dL 18   LDL Chol Calc (NIH) Latest Ref Range: 0 - 99 mg/dL 166 (H)   Iron Latest Ref Range: 27 - 159 ug/dL 104   Folate Latest Ref Range: >3.0 ng/mL 12.1   Vitamin D, 25-Hydroxy Latest Ref Range: 30.0 - 100.0 ng/mL 33.7   Vitamin B12 Latest Ref Range: 232 - 1,245 pg/mL 796   Globulin, Total Latest Ref Range: 1.5 - 4.5 g/dL 2.5   WBC Latest Ref Range: 3.4 - 10.8 x10E3/uL 5.5   RBC Latest Ref Range: 3.77 - 5.28 x10E6/uL 5.44 (H)   Hemoglobin Latest Ref Range: 11.1 - 15.9 g/dL 14.7   HCT Latest Ref Range: 34.0 - 46.6 % 45.3   MCV Latest Ref Range:  79 - 97 fL 83   MCH Latest Ref Range: 26.6 - 33.0 pg 27.0   MCHC Latest Ref Range: 31.5 - 35.7 g/dL 32.5   RDW Latest Ref Range: 11.7 - 15.4 % 15.8 (H)   Platelets Latest Ref Range: 150 - 450 x10E3/uL 184   Neutrophils Latest Ref Range: Not Estab. % 61   Immature Granulocytes Latest Ref Range: Not Estab. % 0   NEUT# Latest Ref Range: 1.4 - 7.0 x10E3/uL 3.4   Lymphocyte # Latest Ref Range: 0.7 - 3.1 x10E3/uL 1.6   Monocytes Absolute Latest Ref Range: 0.1 - 0.9 x10E3/uL 0.4   Basophils Absolute Latest Ref Range: 0.0 - 0.2 x10E3/uL 0.1   Immature Grans (Abs) Latest Ref Range: 0.0 - 0.1 x10E3/uL 0.0   Lymphs Latest Ref Range: Not Estab. % 29   Monocytes Latest Ref Range: Not Estab. % 8   Basos Latest Ref Range: Not Estab. % 1   Eos Latest Ref Range:  Not Estab. % 1   EOS (ABSOLUTE) Latest Ref Range: 0.0 - 0.4 x10E3/uL 0.1   TSH Latest Ref Range: 0.450 - 4.500 uIU/mL 4.610 (H)   Thyroxine (T4) Latest Ref Range: 4.5 - 12.0 ug/dL 11.9   Free Thyroxine Index Latest Ref Range: 1.2 - 4.9  3.1   T3 Uptake Ratio Latest Ref Range: 24 - 39 % 26   Appearance Unknown  Pend  Bilirubin, UA Unknown  Negative  Clarity, UA Unknown  Clear  Color, UA Unknown  Light Yellow  Glucose Latest Ref Range: Negative   Negative  Ketones, UA Unknown  Negative  Leukocytes,UA Latest Ref Range: Negative   Negative  Nitrite, UA Unknown  Negative  pH, UA Latest Ref Range: 5.0 - 8.0   6.0  Protein,UA Latest Ref Range: Negative   Negative  Specific Gravity, UA Latest Ref Range: 1.010 - 1.025   1.010  Urobilinogen, UA Latest Ref Range: 0.2 or 1.0 E.U./dL  0.2  RBC, UA Unknown  Negative  EKG NSR today reviewed Mammogram 09/19/2019 CLINICAL DATA:  Screening.  EXAM: DIGITAL SCREENING BILATERAL MAMMOGRAM WITH TOMO AND CAD  COMPARISON:  Previous exam(s).  ACR Breast Density Category c: The breast tissue is heterogeneously dense, which may obscure small masses.  FINDINGS: There are no findings suspicious for malignancy. Images were processed with CAD.  IMPRESSION: No mammographic evidence of malignancy. A result letter of this screening mammogram will be mailed directly to the patient.  RECOMMENDATION: Screening mammogram in one year. (Code:[id number]-B-01Y)  BI-RADS CATEGORY  1: Negative.   Electronically Signed   By: Dorise Bullion III M.D   On: 09/19/2019 16:29     Assessment & Plan:  A-insomnia, B12 deficiency, thyroid disease, BMI 44.52, elevated alkaline phosphatase, elevated blood pressure, vitamin D deficiency, hyperlipidemia, annual physical excluding gynecological exam  P-Reviewed [place] PMP website last fill ambien CR 12.5mg  po qhs 1/2 tab prn insomnia by PA Lee on 08/23/2019 #30 RF0  All zolpidem Rx from [place] [facility] [employer] [facility].  She is  considering getting repeat sleep study (home option if possible) as diagnosed years ago got rid of her machine as mask didn't fit right couldn't sleep with it.  Discussed sleep hygiene at length re: regular schedule, avoid screen time 2 hours prior to bed time or wear blue light blocking glasses/consider changing screen brightness/settings to dark, dark/quiet/cool bedroom, avoid large meals within 1 hour of sleeping; avoid strenuous exercise within one hour of bedtime; avoid caffeine use after lunch. Try shower/bath; warm non-alcoholic/caffeinated drink  prior to bed. If unable to sleep move to another room with lights out until sleepy/write concerns on notepad or read boring material until sleepy then return to bedroom avoid screen time in middle of night. Exitcare handout on insomnia printed and given to patient.  Follow up if no improvement in symptoms with above strategies  Discussed FDA recommends stopping Lorrin Mais use age 89. Patient verbalized understanding of instructions, agreed with plan of care and had no further questions at this time. P2: stress reduction, exercise  Continue injections B12 1053mcg IM every 2 weeks as scheduled.  S/p gastric bypass.  Level normal 796  Patient to notify us when refill needed.  Patient verbalized understanding information/instructions, agreed with plan of care and had no further questions at this time.  TSH has been variable last couple of years up and down.  Patient does not want to change her levoxyl dose but see if improvement or worsening with exercise/weight loss in 3 months.  Exitcare handout on hypothyroidism printed and given to patient.  Hypothyroidism continue 168mcg po daily.  Repeat TSH 3 months.   Follow up sooner if side effects experienced e.g. palpitations, heat/cold intolerance, diarrhea/constipation, insomnia, hair loss, skin changes, weight gain, or trouble concentrating.  Discussed adipose tissue hormones counteract TSH.  Patient verbalized  understanding and agreed with plan of care and had no further questions at this time.   Vitamin D deficiency continue 50000 units weekly dosing orally.  S/p gastric bypass.  Level normal 33.Patient verbalized understanding information/instructions, agreed with plan of care and had no further questions at this time.  Elevated alkaline phosphatase could be from chronic medications/weight gain/chronic disease.  Will repeat level in 3 months.  Exitcare handout printed and given on liver function tests to patient.  Will check HgbA1c in 3 months also.  Fatty liver disease could be contributing factor.    Encouraged diet and exercise.  Patient wearing garmin fitness watch and using myfitnesspal to track po intake/calories and monitoring steps.  Discussed increase time/distance 10% per week to prevent injuries and replace shoes every 500 miles as midsole wears out.  Discussed reading nutrition labels for added sugars.  Keep under 100 calories per day/5 teaspoons added sugars per AHA.  Patient given preventing health effects of overweight.  Phentermine didn't work for patient in the past to help with appetite suppression.  S/p gastric bypass.  Gym at work just recently reopened.  Discussed increasing from her baseline steps 100/day for 1000 per week until she works up to 10000 steps over the next year daily.  Patient verbalized understanding information/instructions, agreed with plan of care and had no further questions at this time.  Discussed lifestyle modification re diet to decrease white starches/sugars e.g. potatoes/bread increase omega 3 sources nuts, fish options add more fruits and vegetables and lean protein choices such as hummus or yogurt. Patient given exitcare handout on high cholesterol and high fiber foods.  Thyroid out of range can affect lipids also.  Repeat lipids in 6-12 months.   Discussed weight loss. Patient agreed with plan of care and verbalized understanding of information/instructions and had  no further questions at this time.   Elevated blood pressure will recheck with RN Raenette Rover and with blood draws in 3 months.  Weight loss/exercise/decrease salt in diet.  Discussed BP goal 120s/70s. Is a coffee drinker consider decrease caffeine intake.  Discussed ER if chest pain, worst headache of life, dyspnea or visual changes for re-evaluation.  Patient verbalized understanding information/instructions, agreed with plan  of care and had no further questions at this time.  RDW and RBC slightly elevated has been in menopause s/p hysterectomy and iron level returned to normal after cycle of illnesses stopped.  Typically donates blood annually during blood drive has not done so this past year due to covid.  Will repeat CBC in 1 year.  Patient verbalized understanding information/instructions agreed with plan of care and had no further questions at this time.  Annual physical  Colonoscopy due 2024.  Consider repeat sleep study after weight loss/see if thyroid back to normal in 3 months/insomnia improved.  Consider restarting melatonin OTC 10mg  po qhs.  Turning blue light blocker back on phone/TV as she had shut off last month.  Discussed not exercising affects sleep quality also.  Mammogram Bir[facility] 1 09/19/2019.  Discussed exercise all adds up for activity doesn't need to be in long time periods but even 5 minutes hourly has health benefits.  Discussed covid vaccination, social distancing, wearing mask and hand washing/sanitizing frequently during pandemic to prevent disease/infection.  Patient verbalized understanding information/instructions, agreed with plan of care and had no further questions at this time.

## 2019-10-09 NOTE — Patient Instructions (Addendum)
Insomnia Insomnia is a sleep disorder that makes it difficult to fall asleep or stay asleep. Insomnia can cause fatigue, low energy, difficulty concentrating, mood swings, and poor performance at work or school. There are three different ways to classify insomnia:  Difficulty falling asleep.  Difficulty staying asleep.  Waking up too early in the morning. Any type of insomnia can be long-term (chronic) or short-term (acute). Both are common. Short-term insomnia usually lasts for three months or less. Chronic insomnia occurs at least three times a week for longer than three months. What are the causes? Insomnia may be caused by another condition, situation, or substance, such as:  Anxiety.  Certain medicines.  Gastroesophageal reflux disease (GERD) or other gastrointestinal conditions.  Asthma or other breathing conditions.  Restless legs syndrome, sleep apnea, or other sleep disorders.  Chronic pain.  Menopause.  Stroke.  Abuse of alcohol, tobacco, or illegal drugs.  Mental health conditions, such as depression.  Caffeine.  Neurological disorders, such as Alzheimer's disease.  An overactive thyroid (hyperthyroidism). Sometimes, the cause of insomnia may not be known. What increases the risk? Risk factors for insomnia include:  Gender. Women are affected more often than men.  Age. Insomnia is more common as you get older.  Stress.  Lack of exercise.  Irregular work schedule or working night shifts.  Traveling between different time zones.  Certain medical and mental health conditions. What are the signs or symptoms? If you have insomnia, the main symptom is having trouble falling asleep or having trouble staying asleep. This may lead to other symptoms, such as:  Feeling fatigued or having low energy.  Feeling nervous about going to sleep.  Not feeling rested in the morning.  Having trouble concentrating.  Feeling irritable, anxious, or depressed. How  is this diagnosed? This condition may be diagnosed based on:  Your symptoms and medical history. Your health care provider may ask about: ? Your sleep habits. ? Any medical conditions you have. ? Your mental health.  A physical exam. How is this treated? Treatment for insomnia depends on the cause. Treatment may focus on treating an underlying condition that is causing insomnia. Treatment may also include:  Medicines to help you sleep.  Counseling or therapy.  Lifestyle adjustments to help you sleep better. Follow these instructions at home: Eating and drinking   Limit or avoid alcohol, caffeinated beverages, and cigarettes, especially close to bedtime. These can disrupt your sleep.  Do not eat a large meal or eat spicy foods right before bedtime. This can lead to digestive discomfort that can make it hard for you to sleep. Sleep habits   Keep a sleep diary to help you and your health care provider figure out what could be causing your insomnia. Write down: ? When you sleep. ? When you wake up during the night. ? How well you sleep. ? How rested you feel the next day. ? Any side effects of medicines you are taking. ? What you eat and drink.  Make your bedroom a dark, comfortable place where it is easy to fall asleep. ? Put up shades or blackout curtains to block light from outside. ? Use a white noise machine to block noise. ? Keep the temperature cool.  Limit screen use before bedtime. This includes: ? Watching TV. ? Using your smartphone, tablet, or computer.  Stick to a routine that includes going to bed and waking up at the same times every day and night. This can help you fall asleep faster. Consider  making a quiet activity, such as reading, part of your nighttime routine.  Try to avoid taking naps during the day so that you sleep better at night.  Get out of bed if you are still awake after 15 minutes of trying to sleep. Keep the lights down, but try reading or  doing a quiet activity. When you feel sleepy, go back to bed. General instructions  Take over-the-counter and prescription medicines only as told by your health care provider.  Exercise regularly, as told by your health care provider. Avoid exercise starting several hours before bedtime.  Use relaxation techniques to manage stress. Ask your health care provider to suggest some techniques that may work well for you. These may include: ? Breathing exercises. ? Routines to release muscle tension. ? Visualizing peaceful scenes.  Make sure that you drive carefully. Avoid driving if you feel very sleepy.  Keep all follow-up visits as told by your health care provider. This is important. Contact a health care provider if:  You are tired throughout the day.  You have trouble in your daily routine due to sleepiness.  You continue to have sleep problems, or your sleep problems get worse. Get help right away if:  You have serious thoughts about hurting yourself or someone else. If you ever feel like you may hurt yourself or others, or have thoughts about taking your own life, get help right away. You can go to your nearest emergency department or call:  Your local emergency services (911 in the [place]  A suicide crisis helpline, such as the [place] at 2165300371. This is open 24 hours a day. Summary  Insomnia is a sleep disorder that makes it difficult to fall asleep or stay asleep.  Insomnia can be long-term (chronic) or short-term (acute).  Treatment for insomnia depends on the cause. Treatment may focus on treating an underlying condition that is causing insomnia.  Keep a sleep diary to help you and your health care provider figure out what could be causing your insomnia. This information is not intended to replace advice given to you by your health care provider. Make sure you discuss any questions you have with your health care provider. Document  Revised: 06/09/2017 Document Reviewed: 04/06/2017 Elsevier Patient Education  2020 [place]. Complete Blood Count Why am I having this test? A complete blood count (CBC) is a blood test that measures the cells of your blood. The types of cells are red blood cells (RBCs), white blood cells (WBCs), and blood cell fragments that help with clotting (platelets). Changes in these cells can warn your health care provider about many conditions, including anemia, infection, inflammation, bleeding, and blood-related cancer. You may have this test:  As part of a routine physical exam.  To help your health care provider diagnose certain health conditions.  To help your health care provider monitor known health conditions or treatments. What is being tested? A CBC measures your RBCs, WBCs, platelets, and their different components. RBCs are made in the tissue inside your bones (bone marrow) and released into your blood. These cells contain a protein that carries oxygen in your blood (hemoglobin). CBC testing of RBCs includes:  RBC count. This is the total number of RBCs.  Hemoglobin (Hb). This is the total amount of hemoglobin.  Hematocrit (Hct). This is the percentage of space that red blood cells take up in your blood.  Mean corpuscular volume (MCV). This is the average size of red blood cells.  Mean corpuscular  hemoglobin [facility]). This is the average amount of hemoglobin inside each red blood cell.  Mean corpuscular hemoglobin concentration (MCHC). This is the average concentration of hemoglobin inside each red blood cell.  Red blood cell distribution width (RDW). This may be included to measure the variation in the size of red blood cells. WBCs are also made in your bone marrow. They are part of your body's disease-fighting system (immune system). CBC testing of WBCs includes:  WBC count. This is the total number of WBCs.  A count of the five types of  WBCs: ? Neutrophils. ? Lymphocytes. ? Monocytes. ? Eosinophils. ? Basophils. Platelets are cell fragments that are important for blood clotting. A CBC measures:  Total platelet count.  Mean platelet volume (MPV). What kind of sample is taken?  A blood sample is required for this test. It is usually collected by inserting a needle into a blood vessel. How are the results reported? Your test results will be reported as values. Your health care provider will compare your results to normal ranges that were established after testing a large group of people (reference ranges). Reference ranges may vary among labs and hospitals. Reference ranges for a CBC usually apply only to people who are older than 18. For this test, common reference ranges for people older than 18 may be: Red blood cells  RBC count ? Men: 4.7-6.1 ? Women: 4.2-5.4  Hb ? Men: 59-18 ? Women: 12-16  Hct ? Men: 42-52% ? Women: 37-47%  MCV: 80-95  MCH: 27-31  MCHC: 32-36  RDW: 11-14.5% White blood cells  WBC count: 5,000-10,000  Neutrophil count: 2500-8000 or 55-70%  Lymphocyte count: 1000-4000 or 20-40%  Monocyte count: 100-700 or 2-8%  Eosinophil count: 50-500 or 1-4%  Basophil count: 25-100 or 0.5-1% Platelets  Platelet count: 150,000-400,000  MPV: 7.4-10.4 What do the results mean? Results that are outside the reference ranges may indicate that you have an infection or other health condition. Red blood cells  RBC count, Hb, and Hct ? Results lower than the reference ranges may indicate anemia. Anemia may be caused by several conditions, such as iron deficiency anemia. ? Results higher than the reference ranges may indicate polycythemia. This may be caused by several conditions, such as chronic obstructive pulmonary disease (COPD).  MCV, MCH, MCHC, and RDW ? Results outside the reference ranges may indicate a condition that causes anemia. White blood cells  WBC count ? Results lower than  the reference range may indicate an infection or a condition that keeps your bone marrow from making new WBCs. ? Results higher than the reference range may indicate an infection, a condition that causes inflammation (autoimmune disease), or a blood-related cancer.  Neutrophils, lymphocytes, and monocytes ? Results outside the reference ranges may indicate an infection, an autoimmune disease, or certain types of cancer.  Eosinophils and basophils ? Results outside the reference ranges may indicate an allergy, an inflammatory condition such as asthma, or blood cell cancer. Platelets  Platelet count ? Results lower than the reference range may indicate an infection or blood cell cancer. ? Results higher than the reference range may indicate certain types of anemia, an autoimmune disease, or certain cancers.  MPV ? High or low results may indicate a bone marrow abnormality. Talk with your health care provider about what your results mean. Questions to ask your health care provider Ask your health care provider or the department that is doing the test:  When will my results be ready?  How  will I get my results?  What are my treatment options?  What other tests do I need?  What are my next steps? Summary  A CBC is a routine blood test that measures the cells in your blood.  Changes in these cells can indicate many conditions, including anemia, infection, inflammation, and blood cell cancer.  A health care provider will collect a sample of your blood for this test by inserting a needle into a blood vessel.  Talk with your health care provider about what your results mean. This information is not intended to replace advice given to you by your health care provider. Make sure you discuss any questions you have with your health care provider. Document Revised: 06/27/2017 Document Reviewed: 06/27/2017 Elsevier Patient Education  [place]. High-Fiber Diet Fiber, also called  dietary fiber, is a type of carbohydrate that is found in fruits, vegetables, whole grains, and beans. A high-fiber diet can have many health benefits. Your health care provider may recommend a high-fiber diet to help:  Prevent constipation. Fiber can make your bowel movements more regular.  Lower your cholesterol.  Relieve the following conditions: ? Swelling of veins in the anus (hemorrhoids). ? Swelling and irritation (inflammation) of specific areas of the digestive tract (uncomplicated diverticulosis). ? A problem of the large intestine (colon) that sometimes causes pain and diarrhea (irritable bowel syndrome, IBS).  Prevent overeating as part of a weight-loss plan.  Prevent heart disease, type 2 diabetes, and certain cancers. What is my plan? The recommended daily fiber intake in grams (g) includes:  38 g for men age 8 or younger.  30 g for men over age 32.  64 g for women age 50 or younger.  21 g for women over age 31. You can get the recommended daily intake of dietary fiber by:  Eating a variety of fruits, vegetables, grains, and beans.  Taking a fiber supplement, if it is not possible to get enough fiber through your diet. What do I need to know about a high-fiber diet?  It is better to get fiber through food sources rather than from fiber supplements. There is not a lot of research about how effective supplements are.  Always check the fiber content on the nutrition facts label of any prepackaged food. Look for foods that contain 5 g of fiber or more per serving.  Talk with a diet and nutrition specialist (dietitian) if you have questions about specific foods that are recommended or not recommended for your medical condition, especially if those foods are not listed below.  Gradually increase how much fiber you consume. If you increase your intake of dietary fiber too quickly, you may have bloating, cramping, or gas.  Drink plenty of water. Water helps you to digest  fiber. What are tips for following this plan?  Eat a wide variety of high-fiber foods.  Make sure that half of the grains that you eat each day are whole grains.  Eat breads and cereals that are made with whole-grain flour instead of refined flour or white flour.  Eat brown rice, bulgur wheat, or millet instead of white rice.  Start the day with a breakfast that is high in fiber, such as a cereal that contains 5 g of fiber or more per serving.  Use beans in place of meat in soups, salads, and pasta dishes.  Eat high-fiber snacks, such as berries, raw vegetables, nuts, and popcorn.  Choose whole fruits and vegetables instead of processed forms like juice  or sauce. What foods can I eat?  Fruits Berries. Pears. Apples. Oranges. Avocado. Prunes and raisins. Dried figs. Vegetables Sweet potatoes. Spinach. Kale. Artichokes. Cabbage. Broccoli. Cauliflower. Green peas. Carrots. Squash. Grains Whole-grain breads. Multigrain cereal. Oats and oatmeal. Brown rice. Barley. Bulgur wheat. [place]. Quinoa. Bran muffins. Popcorn. Rye wafer crackers. Meats and other proteins [place], kidney, and pinto beans. Soybeans. Split peas. Lentils. Nuts and seeds. Dairy Fiber-fortified yogurt. Beverages Fiber-fortified soy milk. Fiber-fortified orange juice. Other foods Fiber bars. The items listed above may not be a complete list of recommended foods and beverages. Contact a dietitian for more options. What foods are not recommended? Fruits Fruit juice. Cooked, strained fruit. Vegetables Fried potatoes. Canned vegetables. Well-cooked vegetables. Grains White bread. Pasta made with refined flour. White rice. Meats and other proteins Fatty cuts of meat. Fried chicken or fried fish. Dairy Milk. Yogurt. Cream cheese. Sour cream. Fats and oils Butters. Beverages Soft drinks. Other foods Cakes and pastries. The items listed above may not be a complete list of foods and beverages to avoid. Contact a  dietitian for more information. Summary  Fiber is a type of carbohydrate. It is found in fruits, vegetables, whole grains, and beans.  There are many health benefits of eating a high-fiber diet, such as preventing constipation, lowering blood cholesterol, helping with weight loss, and reducing your risk of heart disease, diabetes, and certain cancers.  Gradually increase your intake of fiber. Increasing too fast can result in cramping, bloating, and gas. Drink plenty of water while you increase your fiber.  The best sources of fiber include whole fruits and vegetables, whole grains, nuts, seeds, and beans. This information is not intended to replace advice given to you by your health care provider. Make sure you discuss any questions you have with your health care provider. Document Revised: 05/01/2017 Document Reviewed: 05/01/2017 Elsevier Patient Education  [place]. High Cholesterol  High cholesterol is a condition in which the blood has high levels of a white, waxy, fat-like substance (cholesterol). The human body needs small amounts of cholesterol. The liver makes all the cholesterol that the body needs. Extra (excess) cholesterol comes from the food that we eat. Cholesterol is carried from the liver by the blood through the blood vessels. If you have high cholesterol, deposits (plaques) may build up on the walls of your blood vessels (arteries). Plaques make the arteries narrower and stiffer. Cholesterol plaques increase your risk for heart attack and stroke. Work with your health care provider to keep your cholesterol levels in a healthy range. What increases the risk? This condition is more likely to develop in people who:  Eat foods that are high in animal fat (saturated fat) or cholesterol.  Are overweight.  Are not getting enough exercise.  Have a family history of high cholesterol. What are the signs or symptoms? There are no symptoms of this condition. How is this  diagnosed? This condition may be diagnosed from the results of a blood test.  If you are older than age 8, your health care provider may check your cholesterol every 4-6 years.  You may be checked more often if you already have high cholesterol or other risk factors for heart disease. The blood test for cholesterol measures:  "Bad" cholesterol (LDL cholesterol). This is the main type of cholesterol that causes heart disease. The desired level for LDL is less than 100.  "Good" cholesterol (HDL cholesterol). This type helps to protect against heart disease by cleaning the arteries and carrying the  LDL away. The desired level for HDL is 60 or higher.  Triglycerides. These are fats that the body can store or burn for energy. The desired number for triglycerides is lower than 150.  Total cholesterol. This is a measure of the total amount of cholesterol in your blood, including LDL cholesterol, HDL cholesterol, and triglycerides. A healthy number is less than 200. How is this treated? This condition is treated with diet changes, lifestyle changes, and medicines. Diet changes  This may include eating more whole grains, fruits, vegetables, nuts, and fish.  This may also include cutting back on red meat and foods that have a lot of added sugar. Lifestyle changes  Changes may include getting at least 40 minutes of aerobic exercise 3 times a week. Aerobic exercises include walking, biking, and swimming. Aerobic exercise along with a healthy diet can help you maintain a healthy weight.  Changes may also include quitting smoking. Medicines  Medicines are usually given if diet and lifestyle changes have failed to reduce your cholesterol to healthy levels.  Your health care provider may prescribe a statin medicine. Statin medicines have been shown to reduce cholesterol, which can reduce the risk of heart disease. Follow these instructions at home: Eating and drinking If told by your health care  provider:  Eat chicken (without skin), fish, veal, shellfish, ground [place] breast, and round or loin cuts of red meat.  Do not eat fried foods or fatty meats, such as hot dogs and salami.  Eat plenty of fruits, such as apples.  Eat plenty of vegetables, such as broccoli, potatoes, and carrots.  Eat beans, peas, and lentils.  Eat grains such as barley, rice, couscous, and bulgur wheat.  Eat pasta without cream sauces.  Use skim or nonfat milk, and eat low-fat or nonfat yogurt and cheeses.  Do not eat or drink whole milk, cream, ice cream, egg yolks, or hard cheeses.  Do not eat stick margarine or tub margarines that contain trans fats (also called partially hydrogenated oils).  Do not eat saturated tropical oils, such as coconut oil and palm oil.  Do not eat cakes, cookies, crackers, or other baked goods that contain trans fats.  General instructions  Exercise as directed by your health care provider. Increase your activity level with activities such as gardening, walking, and taking the stairs.  Take over-the-counter and prescription medicines only as told by your health care provider.  Do not use any products that contain nicotine or tobacco, such as cigarettes and e-cigarettes. If you need help quitting, ask your health care provider.  Keep all follow-up visits as told by your health care provider. This is important. Contact a health care provider if:  You are struggling to maintain a healthy diet or weight.  You need help to start on an exercise program.  You need help to stop smoking. Get help right away if:  You have chest pain.  You have trouble breathing. This information is not intended to replace advice given to you by your health care provider. Make sure you discuss any questions you have with your health care provider. Document Revised: 06/30/2017 Document Reviewed: 12/26/2015 Elsevier Patient Education  [place]. Hypothyroidism  Hypothyroidism is when the thyroid gland does not make enough of certain hormones (it is underactive). The thyroid gland is a small gland located in the lower front part of the neck, just in front of the windpipe (trachea). This gland makes hormones that help control how the body uses food  for energy (metabolism) as well as how the heart and brain function. These hormones also play a role in keeping your bones strong. When the thyroid is underactive, it produces too little of the hormones thyroxine (T4) and triiodothyronine (T3). What are the causes? This condition may be caused by:  Hashimoto's disease. This is a disease in which the body's disease-fighting system (immune system) attacks the thyroid gland. This is the most common cause.  Viral infections.  Pregnancy.  Certain medicines.  Birth defects.  Past radiation treatments to the head or neck for cancer.  Past treatment with radioactive iodine.  Past exposure to radiation in the environment.  Past surgical removal of part or all of the thyroid.  Problems with a gland in the center of the brain (pituitary gland).  Lack of enough iodine in the diet. What increases the risk? You are more likely to develop this condition if:  You are female.  You have a family history of thyroid conditions.  You use a medicine called lithium.  You take medicines that affect the immune system (immunosuppressants). What are the signs or symptoms? Symptoms of this condition include:  Feeling as though you have no energy (lethargy).  Not being able to tolerate cold.  Weight gain that is not explained by a change in diet or exercise habits.  Lack of appetite.  Dry skin.  Coarse hair.  Menstrual irregularity.  Slowing of thought processes.  Constipation.  Sadness or depression. How is this diagnosed? This condition may be diagnosed based on:  Your symptoms, your medical history, and a physical  exam.  Blood tests. You may also have imaging tests, such as an ultrasound or MRI. How is this treated? This condition is treated with medicine that replaces the thyroid hormones that your body does not make. After you begin treatment, it may take several weeks for symptoms to go away. Follow these instructions at home:  Take over-the-counter and prescription medicines only as told by your health care provider.  If you start taking any new medicines, tell your health care provider.  Keep all follow-up visits as told by your health care provider. This is important. ? As your condition improves, your dosage of thyroid hormone medicine may change. ? You will need to have blood tests regularly so that your health care provider can monitor your condition. Contact a health care provider if:  Your symptoms do not get better with treatment.  You are taking thyroid replacement medicine and you: ? Sweat a lot. ? Have tremors. ? Feel anxious. ? Lose weight rapidly. ? Cannot tolerate heat. ? Have emotional swings. ? Have diarrhea. ? Feel weak. Get help right away if you have:  Chest pain.  An irregular heartbeat.  A rapid heartbeat.  Difficulty breathing. Summary  Hypothyroidism is when the thyroid gland does not make enough of certain hormones (it is underactive).  When the thyroid is underactive, it produces too little of the hormones thyroxine (T4) and triiodothyronine (T3).  The most common cause is Hashimoto's disease, a disease in which the body's disease-fighting system (immune system) attacks the thyroid gland. The condition can also be caused by viral infections, medicine, pregnancy, or past radiation treatment to the head or neck.  Symptoms may include weight gain, dry skin, constipation, feeling as though you do not have energy, and not being able to tolerate cold.  This condition is treated with medicine to replace the thyroid hormones that your body does not  make. This information is  not intended to replace advice given to you by your health care provider. Make sure you discuss any questions you have with your health care provider. Document Revised: 06/09/2017 Document Reviewed: 06/07/2017 Elsevier Patient Education  [place] Risks of Being Overweight Maintaining a healthy body weight is an important part of your overall health. Your healthy body weight depends on your age, gender, and height. Being overweight puts you at risk for many health problems, including:  Heart disease.  Diabetes.  Problems sleeping.  Joint problems. You can make changes to your diet and lifestyle to prevent these risks. Consider working with a health care provider or a dietitian to make these changes. What nutrition changes can be made?   Eat only as much as your body needs. In most cases, this is about 2,000 calories a day, but the amount varies depending on your height, gender, and activity level. Ask your health care provider how many calories you should have each day. Eating more than your body needs on a regular basis can cause you to become overweight or obese.  Eat slowly, and stop eating when you feel full.  Choose healthy foods, including: ? Fruits and vegetables. ? Lean meats. ? Low-fat dairy products. ? High-fiber foods, such as whole grains and beans. ? Healthy snacks like vegetable sticks, a piece of fruit, or a small amount of yogurt or cheese.  Avoid foods and drinks that are high in sugar, salt (sodium), saturated fat, or trans fat. This includes: ? Many desserts such as candy, cookies, and ice cream. ? Soda. ? Fried foods. ? Processed meats such as hot dogs or lunch meats. ? Prepackaged snack foods. What lifestyle changes can be made?   Exercise for at least 150 minutes a week to prevent weight gain, or as often as recommended by your health care provider. Do moderate-intensity exercise, such as brisk  walking. ? Spread it out by exercising for 30 minutes 5 days a week, or in short 10-minute bursts several times a day.  Find other ways to stay active and burn calories, such as yard work or a hobby that involves physical activity.  Get at least 8 hours of sleep each night. When you are well-rested, you are more likely to be active and make healthy choices during the day. To sleep better: ? Try to go to bed and wake up at about the same time every day. ? Keep your bedroom dark, quiet, and cool. ? Make sure that your bed is comfortable. ? Avoid stimulating activities, such as watching television or exercising, for at least one hour before bedtime. Why are these changes important? Eating healthy and being active helps you lose weight and prevent health problems caused by being overweight. Making these changes can also help you manage stress, feel better mentally, and connect with friends and family. What can happen if changes are not made? Being overweight can affect you for your entire life. You may develop joint or bone problems that make it painful or difficult for you to play sports or do activities you enjoy. Being overweight puts stress on your heart and lungs and can lead to medical problems like diabetes, heart disease, and sleeping problems. Where to find support You can get support for preventing health risks of being overweight from:  Your health care provider or a dietitian. They can provide guidance about healthy eating and healthy lifestyle choices.  Weight loss support groups, online or in-person. Where to find more information  MyPlate: FormerBoss.no ? This an online tool that provides personalized recommendations about foods to eat each day.  The Centers for Disease Control and Prevention: http://sharp-hammond.biz/ ? This resource gives tips for managing weight and having an active lifestyle. Summary  To prevent unhealthy weight gain, it is important to maintain a  healthy diet high in vegetables and whole grains, exercise regularly, and get at least 8 hours of sleep each night.  Making these changes helps prevent many long-term (chronic) health conditions that can shorten your life, such as diabetes, heart disease, and stroke. This information is not intended to replace advice given to you by your health care provider. Make sure you discuss any questions you have with your health care provider. Document Revised: 03/20/2019 Document Reviewed: 05/24/2017 Elsevier Patient Education  2020 [employer]. Liver Function Tests Why am I having this test? Liver function tests are done to see how well your liver is working. The proteins and enzymes measured in the test can alert your health care provider to inflammation, damage, or disease in your liver. It is common to have liver function tests:  When you are taking certain medicines.  If you have liver disease.  If you drink a lot of alcohol.  When you are not feeling well.  When you have other conditions that may affect your liver.  During annual physical exams.  If you have symptoms such as yellowing of the skin (jaundice), abdominal pain, or nausea and vomiting. What is being tested? These tests measure various substances in your blood. This may include:  Alanine transaminase (ALT). This is an enzyme in the liver.  Aspartate transaminase (AST). This is an enzyme in the liver, heart, and muscles.  Alkaline phosphatase (ALP). This is a protein in the liver, bile ducts, bone, and other body tissues.  Total bilirubin. This is a yellow pigment in bile.  Albumin. This is a protein in the liver.  Prothrombin time and international normalized ratio (PT and INR). PT measures the time it takes for your blood to clot. INR is a calculation of blood clotting time based on your PT result. It is also calculated based on normal ranges defined by the lab that processed your test.  Total protein. This includes  two proteins, albumin and globulin, found in the blood. What kind of sample is taken?  A blood sample is required for this test. It is usually collected by inserting a needle into a blood vessel. How do I prepare for this test? How you prepare will depend on which tests are being done and the reason for doing them. You may need to:  Avoid eating for 4-6 hours before the test, or as told by your health care provider.  Stop taking certain medicines before your blood test, as told by your health care provider. Tell a health care provider about:  All medicines you are taking, including vitamins, herbs, eye drops, creams, and over-the-counter medicines.  Any medical conditions you have.  Whether you are pregnant or may be pregnant. How are the results reported? Your test results will be reported as values. Your health care provider will compare your results to normal ranges that were established after testing a large group of people (reference ranges). Reference ranges may vary among labs and hospitals. For the substances measured in liver function tests, common reference ranges are: ALT  Infant: 10-40 international units/L.  Child or adult: 4-36 international units/L at 37C or 4-36 units/L (SI units).  Reference ranges may be higher  for older adults. AST  Newborn 21-64 days old: 35-140 units/L.  Child younger than 18 years old: 15-60 units/L.  31-71 years old: 15-50 units/L.  20-41 years old: 10-50 units/L.  26-24 years old: 10-40 units/L.  Adult: 0-35 units/L or 0-0.58 microkatals/L (SI units).  Reference ranges may be higher for older adults. ALP  Child younger than 32 years old: 85-235 units/L.  57-28 years old: 65-210 units/L.  51-74 years old: 60-300 units/L.  71-35 years old: 30-200 units/L.  Adult: 30-120 units/L or 0.5-2.0 microkatals/L (SI units).  Reference ranges may be higher for older adults. Total bilirubin  Newborn: 1.0-12.0 mg/dL or 17.1-205 micromoles/L (SI  units).  Child or adult: 0.3-1.0 mg/dL or 5.1-17 micromoles/L. Albumin  Premature infant: 3.0-4.2 g/dL.  Newborn: 3.5-5.4 g/dL.  Infant: 4.4-5.4 g/dL.  Child: 4.0-5.9 g/dL.  Adult: 3.5-5.0 g/dL or 35-50 g/L (SI units). PT  11.0-12.5 seconds; 85%-100%. INR  0.8-1.1. Total protein  Premature infant: 4.2-7.6 g/dL.  Newborn: 4.6-7.4 g/dL.  Infant: 6.0-6.7 g/dL.  Child: 6.2-8.0 g/dL.  Adult: 6.4-8.3 g/dL or 64-83 g/L (SI units). What do the results mean? Results that are within the reference ranges are considered normal. For each substance measured, results outside the reference range can indicate various health issues. ALT  Levels above the normal range may indicate liver disease. AST  Levels above the normal range may indicate liver disease. Sometimes levels also increase after burns, surgery, heart attack, muscle damage, or seizure. ALP  Levels above the normal range may be seen in biliary obstruction, liver diseases, bone disease, thyroid disease, tumors, fractures, leukemia, lymphoma, or several other conditions. People with blood type O or B may show higher levels after a fatty meal.  Levels below the normal range may indicate bone and teeth conditions, malnutrition, protein deficiency, or Wilson's disease. Total bilirubin  Levels above the normal range may indicate problems with the liver, gallbladder, or bile ducts. Albumin  Levels above the normal range may indicate dehydration. They may also be caused by a diet that is high in protein.  Levels below the normal range may indicate kidney disease, liver disease, or malabsorption of nutrients. PT and INR  Levels above the normal range mean that your blood is clotting slower than normal. This may be due to blood disorders, liver disorders, or low levels of vitamin K. Total protein  Levels above the normal range may be due to infection or other diseases.  Levels below the normal range may be due to an immune  system disorder, bleeding, burns, kidney disorder, liver disease, trouble absorbing or getting nutrients, or other conditions that affect the intestines. Talk with your health care provider about what your results mean. Questions to ask your health care provider Ask your health care provider, or the department that is doing the test:  When will my results be ready?  How will I get my results?  What are my treatment options?  What other tests do I need?  What are my next steps? Summary  Liver function tests are done to see how well your liver is working.  These tests measure various proteins and enzymes in your blood. The results can alert your health care provider to inflammation, damage, or disease in your liver.  Talk with your health care provider about what your results mean. This information is not intended to replace advice given to you by your health care provider. Make sure you discuss any questions you have with your health care provider. Document Revised: 02/14/2018 Document  Reviewed: 04/11/2017 Elsevier Patient Education  [employer].

## 2019-10-18 ENCOUNTER — Other Ambulatory Visit: Payer: Self-pay

## 2019-10-18 ENCOUNTER — Ambulatory Visit: Payer: Self-pay

## 2019-10-18 DIAGNOSIS — E538 Deficiency of other specified B group vitamins: Secondary | ICD-10-CM

## 2019-11-01 ENCOUNTER — Ambulatory Visit: Payer: Self-pay

## 2019-11-01 ENCOUNTER — Other Ambulatory Visit: Payer: Self-pay

## 2019-11-01 DIAGNOSIS — E538 Deficiency of other specified B group vitamins: Secondary | ICD-10-CM

## 2019-11-01 MED ORDER — CYANOCOBALAMIN 1000 MCG/ML IJ SOLN
1000.0000 ug | INTRAMUSCULAR | Status: AC
  Administered 2019-11-01 – 2020-04-17 (×9): 1000 ug via INTRAMUSCULAR

## 2019-11-02 ENCOUNTER — Ambulatory Visit: Payer: 59 | Attending: Internal Medicine

## 2019-11-02 DIAGNOSIS — Z23 Encounter for immunization: Secondary | ICD-10-CM

## 2019-11-02 NOTE — Progress Notes (Signed)
   Covid-19 Vaccination Clinic  Name:  Mary [name]    MRN: [id number] DOB: 1961-01-16  11/02/2019  [name] was observed post Covid-19 immunization for 15 minutes without incident. She was provided with Vaccine Information Sheet and instruction to access the V-Safe system.   [name] was instructed to call 911 with any severe reactions post vaccine: Marland Kitchen Difficulty breathing  . Swelling of face and throat  . A fast heartbeat  . A bad rash all over body  . Dizziness and weakness   Immunizations Administered    Name Date Dose VIS Date Route   Pfizer COVID-19 Vaccine 11/02/2019  9:00 AM 0.3 mL 09/04/2018 Intramuscular   Manufacturer: [place]   Lot: [id number]   [place]: [id number]

## 2019-11-15 ENCOUNTER — Ambulatory Visit: Payer: 59

## 2019-11-15 ENCOUNTER — Other Ambulatory Visit: Payer: Self-pay

## 2019-11-15 DIAGNOSIS — E538 Deficiency of other specified B group vitamins: Secondary | ICD-10-CM

## 2019-11-19 ENCOUNTER — Other Ambulatory Visit: Payer: Self-pay

## 2019-11-19 MED ORDER — LEVOTHYROXINE SODIUM 100 MCG PO TABS: 100.0000 ug | ORAL_TABLET | Freq: Every day | ORAL | 1 refills | Status: DC

## 2019-11-26 ENCOUNTER — Ambulatory Visit: Payer: 59

## 2019-11-29 ENCOUNTER — Ambulatory Visit: Payer: 59 | Attending: Internal Medicine

## 2019-11-29 ENCOUNTER — Ambulatory Visit: Payer: Self-pay

## 2019-11-29 ENCOUNTER — Other Ambulatory Visit: Payer: Self-pay

## 2019-11-29 DIAGNOSIS — E538 Deficiency of other specified B group vitamins: Secondary | ICD-10-CM

## 2019-11-29 DIAGNOSIS — Z23 Encounter for immunization: Secondary | ICD-10-CM

## 2019-11-29 NOTE — Progress Notes (Signed)
° °  Covid-19 Vaccination Clinic  Name:  Mary [name]    MRN: [id number] DOB: Mar 13, 1961  11/29/2019  [name] was observed post Covid-19 immunization for 30 minutes based on pre-vaccination screening without incident. She was provided with Vaccine Information Sheet and instruction to access the V-Safe system.   [name] was instructed to call 911 with any severe reactions post vaccine:  Difficulty breathing   Swelling of face and throat   A fast heartbeat   A bad rash all over body   Dizziness and weakness   Immunizations Administered    Name Date Dose VIS Date Route   Pfizer COVID-19 Vaccine 11/29/2019 11:43 AM 0.3 mL 09/04/2018 Intramuscular   Manufacturer: [place]   Lot: [id number]   [place]: [id number]

## 2019-12-13 ENCOUNTER — Other Ambulatory Visit: Payer: Self-pay

## 2019-12-13 ENCOUNTER — Ambulatory Visit: Payer: Self-pay

## 2019-12-13 DIAGNOSIS — E538 Deficiency of other specified B group vitamins: Secondary | ICD-10-CM

## 2019-12-17 ENCOUNTER — Emergency Department
Admission: EM | Admit: 2019-12-17 | Discharge: 2019-12-17 | Disposition: A | Discharge status: Home / Self Care | Payer: No Typology Code available for payment source | Attending: Student | Admitting: Student

## 2019-12-17 ENCOUNTER — Encounter: Payer: Self-pay | Admitting: Emergency Medicine

## 2019-12-17 ENCOUNTER — Emergency Department: Payer: No Typology Code available for payment source

## 2019-12-17 ENCOUNTER — Other Ambulatory Visit: Payer: Self-pay

## 2019-12-17 DIAGNOSIS — W010XXA Fall on same level from slipping, tripping and stumbling without subsequent striking against object, initial encounter: Secondary | ICD-10-CM | POA: Diagnosis not present

## 2019-12-17 DIAGNOSIS — I10 Essential (primary) hypertension: Secondary | ICD-10-CM | POA: Diagnosis not present

## 2019-12-17 DIAGNOSIS — Y990 Civilian activity done for income or pay: Secondary | ICD-10-CM | POA: Diagnosis not present

## 2019-12-17 DIAGNOSIS — Z79899 Other long term (current) drug therapy: Secondary | ICD-10-CM | POA: Insufficient documentation

## 2019-12-17 DIAGNOSIS — Y9289 Other specified places as the place of occurrence of the external cause: Secondary | ICD-10-CM | POA: Insufficient documentation

## 2019-12-17 DIAGNOSIS — M25562 Pain in left knee: Secondary | ICD-10-CM | POA: Insufficient documentation

## 2019-12-17 DIAGNOSIS — E039 Hypothyroidism, unspecified: Secondary | ICD-10-CM | POA: Diagnosis not present

## 2019-12-17 DIAGNOSIS — Y939 Activity, unspecified: Secondary | ICD-10-CM | POA: Insufficient documentation

## 2019-12-17 DIAGNOSIS — S42141A Displaced fracture of glenoid cavity of scapula, right shoulder, initial encounter for closed fracture: Secondary | ICD-10-CM | POA: Insufficient documentation

## 2019-12-17 DIAGNOSIS — Z87891 Personal history of nicotine dependence: Secondary | ICD-10-CM | POA: Insufficient documentation

## 2019-12-17 DIAGNOSIS — S4991XA Unspecified injury of right shoulder and upper arm, initial encounter: Secondary | ICD-10-CM | POA: Diagnosis present

## 2019-12-17 MED ORDER — HYDROCODONE-ACETAMINOPHEN 5-325 MG PO TABS
1.0000 | ORAL_TABLET | Freq: Once | ORAL | Status: AC
  Administered 2019-12-17: 1 via ORAL
  Filled 2019-12-17: qty 1

## 2019-12-17 MED ORDER — HYDROCODONE-ACETAMINOPHEN 5-325 MG PO TABS: 1.0000 | ORAL_TABLET | Freq: Three times a day (TID) | ORAL | 0 refills | Status: AC | PRN

## 2019-12-17 NOTE — Consult Note (Signed)
Called by Laban Emperor, PA from ED regarding this 59 year old female with right shoulder pain s/p fall in hallway at work today.  I have reviewed plain xrays and CT images which demonstrate a minimally displaced fracture of the right anterior glenoid.  I have recommended the patient be placed in a sling or shoulder immobilizer and follow up in the office within a week.

## 2019-12-17 NOTE — ED Notes (Signed)
Pt ambulatory to the restroom at this time.  

## 2019-12-17 NOTE — ED Triage Notes (Signed)
Patient to the ER for c/o fall at work this afternoon. Patient states she tripped when walking, now c/o pain to right shoulder, left knee, and muscle to right upper leg.

## 2019-12-17 NOTE — Discharge Instructions (Signed)
Please wear shoulder immobilizer.  You can take Vicodin for extreme pain.  I shoulder tonight.  Please call orthopedics in the morning for a follow-up appointment.

## 2019-12-17 NOTE — ED Triage Notes (Signed)
First nurse note- arrived EMS.  Pt had mechanical fall. Right shoulder pain.

## 2019-12-17 NOTE — ED Notes (Signed)
Waiting on urine sample for Worker's Comp.

## 2019-12-17 NOTE — ED Notes (Signed)
See triage note  Presents s/p fall  States she was walking in the hall  Thinks her foot caught on the floor fell forward  Landed on left knee  And right arm  Pain is mainly in right shoulder.

## 2019-12-17 NOTE — ED Provider Notes (Signed)
[facility] Emergency Department Provider Note  ____________________________________________  Time seen: Approximately 4:19 PM  I have reviewed the triage vital signs and the nursing notes.   HISTORY  Chief Complaint Fall    HPI Mary [name] is a 59 y.o. female that presents to the emergency department for evaluation of right shoulder pain and left knee pain following a fall today.  Patient states that she felt like there was no grip on the linoleum, causing her to fall.  She landed on her left knee and used her right arm to try to catch her fall.  She did not hit her head or lose consciousness.  Her shoulder feels "sore" to the outside of her shoulder.  She has been up walking.  No shortness of breath, chest pain, back pain, nausea, vomiting, abdominal pain.  Past Medical History:  Diagnosis Date  . Anemia    low iron at times  . Arthritis   . Colon polyps   . Complication of anesthesia 1991   Pt reports she awoke during plantar fascia surgery and felt "everything"  . Heart murmur    " small per patient"   . Hyperlipidemia   . Hypertension   . Hypothyroidism   . Lactose intolerance   . PAC (premature atrial contraction)   . Palpitations   . Panic attacks    Pt reports restrictive clothing/areas and dry mouth cause her to have attack.  . Peripheral vascular disease ([place])    "small" clot after thermal ablasion  . Pneumonia 2016   "walking pneumonia" 3 times in 2016  . Shortness of breath    going upstairs (due to weight). not so much since weight loss  . Sleep apnea    after weight loss does not have to use cpap  . Vitamin B12 deficiency   . Vitamin D deficiency     Patient Active Problem List   Diagnosis Date Noted  . Vitamin B12 deficiency 10/08/2019  . Arthritis 07/16/2019  . Edema 07/16/2019  . History of abdominal pain 07/16/2019  . Low back pain 07/16/2019  . Vitamin D deficiency 07/16/2019  . Thyroid disease 02/28/2019  . History  of gastric bypass 02/28/2019  . Obstructive sleep apnea syndrome 02/28/2019  . Insomnia 02/28/2019  . Class 3 severe obesity due to excess calories with serious comorbidity and body mass index (BMI) of 40.0 to 44.9 in adult [facility]) 02/28/2019  . Elevated BP without diagnosis of hypertension 02/28/2019  . Other hyperlipidemia 02/28/2019  . Hot flashes 02/28/2019  . BMI 40.0-44.9, adult ([place]) 12/16/2015  . Hematoma of lower extremity 11/12/2015  . Essential hypertension 06/20/2014  . Pure hypercholesterolemia 06/20/2014  . S/P lumbar spinal fusion 07/10/2013  . Arthrodesis status 07/10/2013  . Palpitations 01/02/2012  . Edema of both legs 01/02/2012    Past Surgical History:  Procedure Laterality Date  . APPENDECTOMY  1985  . CESAREAN SECTION  1987   twins  . COLONOSCOPY    . COLONOSCOPY WITH PROPOFOL N/A 02/27/2018   Procedure: COLONOSCOPY WITH PROPOFOL;  Surgeon: Toledo, Benay Pike, MD;  Location: [facility] ENDOSCOPY;  Service: Gastroenterology;  Laterality: N/A;  . IRRIGATION AND DEBRIDEMENT HEMATOMA Right 11/12/2015   Procedure: IRRIGATION AND DEBRIDEMENT HEMATOMA;  Surgeon: Robert Bellow, MD;  Location: [facility] ORS;  Service: General;  Laterality: Right;  . LAMINECTOMY  07/10/2013   L4   L5   DR Ronnald Ramp  . LAPAROSCOPIC SALPINGO OOPHERECTOMY Right 04/17/2014   Procedure: LAPAROSCOPIC SALPINGO OOPHORECTOMY RIGHT;  Surgeon: Everitt Amber, MD;  Location: [facility] ORS;  Service: Gynecology;  Laterality: Right;  . LYSIS OF ADHESION  04/17/2014   Procedure: LYSIS OF ADHESION;  Surgeon: Everitt Amber, MD;  Location: [facility] ORS;  Service: Gynecology;;  . MAXIMUM ACCESS (MAS)POSTERIOR LUMBAR INTERBODY FUSION (PLIF) 1 LEVEL  07/10/2013   Procedure: FOR MAXIMUM ACCESS (MAS) POSTERIOR LUMBAR INTERBODY FUSION (PLIF) LUMBAR FOUR-FIVE;  Surgeon: Eustace Moore, MD;  Location: [facility];  Service: Neurosurgery;;  FOR MAXIMUM ACCESS (MAS) POSTERIOR LUMBAR INTERBODY FUSION (PLIF) LUMBAR FOUR-FIVE  . OOPHORECTOMY     LSO  .  OPEN REDUCTION INTERNAL FIXATION (ORIF) DISTAL RADIAL FRACTURE Left 09/20/2018   Procedure: OPEN REDUCTION INTERNAL FIXATION (ORIF) DISTAL RADIAL FRACTURE, LEFT;  Surgeon: Hessie Knows, MD;  Location: [facility] ORS;  Service: Orthopedics;  Laterality: Left;  . PLANTAR FASCIA SURGERY  1999  . 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 VAGINAL HYSTERECTOMY  2001   for bleeding and LSO for cyst    Prior to Admission medications   Medication Sig Start Date End Date Taking? Authorizing Provider  chlorhexidine (PERIDEX) 0.12 % solution PLEASE SEE ATTACHED FOR DETAILED DIRECTIONS 07/10/19   [provider]  EPINEPHrine (EPIPEN 2-PAK) 0.3 mg/0.3 mL IJ SOAJ injection Inject 0.3 mLs (0.3 mg total) into the muscle as needed for anaphylaxis. 02/08/19   Towanda Malkin, MD  HYDROcodone-acetaminophen (NORCO/VICODIN) 5-325 MG tablet Take 1 tablet by mouth every 8 (eight) hours as needed for up to 2 days for moderate pain. 12/17/19 12/19/19  Laban Emperor, PA-C  Lactase (LACTAID FAST ACT) 9000 units TABS Take 9,000 Units by mouth as needed (consuming dairy).    [provider]  levothyroxine (SYNTHROID) 100 MCG tablet Take 1 tablet (100 mcg total) by mouth daily before breakfast. 11/19/19   Sable Feil, PA-C  PARoxetine (PAXIL) 10 MG tablet Take 1 tablet (10 mg total) by mouth at bedtime. Can take prn 02/28/19   Lebron Conners D, MD  triamcinolone cream (KENALOG) 0.1 % APPLY TWICE DAILY TO ITCHY SKIN UNTIL IMPROVED THEN AS NEEDED. 09/06/19   [provider]  Vitamin D, Ergocalciferol, (DRISDOL) 1.25 MG (50000 UT) CAPS capsule Take one cap once weekly 02/28/19   Towanda Malkin, MD  zolpidem (AMBIEN CR) 12.5 MG CR tablet TAKE 1/2 TAB AT BEDTIME ONLY WHEN NEEDED; attempt taper 10/09/19   Betancourt, Aura Fey, NP    Allergies Bee venom, Citalopram, Lactose intolerance (gi), and Tape  Family History  Problem Relation Age of Onset  . Heart disease Mother   . Lung cancer Mother    . Diabetes Mother        later in life  . Coronary artery disease Mother   . Heart disease Father   . Arrhythmia Sister   . Breast cancer Sister 47       diagnosed 09/09/2016  . Colonic polyp Brother        pre cancer    Social History Social History   Tobacco Use  . Smoking status: Former Smoker    Packs/day: 0.30    Years: 4.00    Pack years: 1.20    Quit date: 07/11/2002    Years since quitting: 17.4  . Smokeless tobacco: Never Used  Substance Use Topics  . Alcohol use: Yes    Alcohol/week: 1.0 standard drinks    Types: 1 Standard drinks or equivalent per week    Comment: occasional beer and wine  . Drug use: No     Review  of Systems  Cardiovascular: No chest pain. Respiratory: No SOB. Gastrointestinal: No abdominal pain.  No vomiting.  Musculoskeletal: Positive for shoulder and knee pain. Skin: Negative for rash, abrasions, lacerations, ecchymosis. Neurological: Negative for headaches, numbness or tingling   ____________________________________________   PHYSICAL EXAM:  VITAL SIGNS: ED Triage Vitals  Enc Vitals Group     BP 12/17/19 1444 (!) 152/85     Pulse Rate 12/17/19 1444 77     Resp 12/17/19 1444 20     Temp 12/17/19 1444 97.8 F (36.6 C)     Temp Source 12/17/19 1444 Oral     SpO2 12/17/19 1444 100 %     Weight 12/17/19 1445 277 lb (125.6 kg)     Height 12/17/19 1445 5' 6.5" (1.689 m)     Head Circumference --      Peak Flow --      Pain Score 12/17/19 1445 7     Pain Loc --      Pain Edu? --      Excl. in [place]? --      Constitutional: Alert and oriented. Well appearing and in no acute distress. Eyes: Conjunctivae are normal. PERRL. EOMI. Head: Atraumatic. ENT:      Ears:      Nose: No congestion/rhinnorhea.      Mouth/Throat: Mucous membranes are moist.  Neck: No stridor. No cervical spine tenderness to palpation. Cardiovascular: Normal rate, regular rhythm.  Good peripheral circulation.  Symmetric radial pulses  bilaterally. Respiratory: Normal respiratory effort without tachypnea or retractions. Lungs CTAB. Good air entry to the bases with no decreased or absent breath sounds. Gastrointestinal: Bowel sounds 4 quadrants. Soft and nontender to palpation. No guarding or rigidity. No palpable masses. No distention.  Musculoskeletal: Full range of motion to all extremities. No gross deformities appreciated.  No tenderness to palpation throughout right or left scapula.  No tenderness to palpation throughout back.  No tenderness to palpation to thoracic or lumbar spine.  No tenderness to palpation to chest wall.  Limited range of motion to right shoulder due to pain.  Tenderness to palpation to lateral right shoulder.  Full range of motion of left knee but with pain.  Weightbearing. Neurologic:  Normal speech and language. No gross focal neurologic deficits are appreciated.  Skin:  Skin is warm, dry and intact. No rash noted. Psychiatric: Mood and affect are normal. Speech and behavior are normal. Patient exhibits appropriate insight and judgement.   ____________________________________________   LABS (all labs ordered are listed, but only abnormal results are displayed)  Labs Reviewed - No data to display ____________________________________________  EKG   ____________________________________________  RADIOLOGY Robinette Haines, personally viewed and evaluated these images (plain radiographs) as part of my medical decision making, as well as reviewing the written report by the radiologist.  DG Shoulder Right  Result Date: 12/17/2019 CLINICAL DATA:  Status post fall. EXAM: RIGHT SHOULDER - 2+ VIEW COMPARISON:  None. FINDINGS: A nondisplaced fracture deformity of indeterminate age is seen involving the inferior aspect of the right glenoid. There is no evidence of dislocation. There is no evidence of significant arthropathy or other focal bone abnormality. Soft tissues are unremarkable. IMPRESSION:  Fracture deformity of the right glenoid of indeterminate age. Electronically Signed   By: Virgina Norfolk M.D.   On: 12/17/2019 15:46   CT Shoulder Right Wo Contrast  Result Date: 12/17/2019 CLINICAL DATA:  Right shoulder pain after fall.  Abnormal x-ray EXAM: CT OF THE UPPER RIGHT EXTREMITY WITHOUT CONTRAST  TECHNIQUE: Multidetector CT imaging of the upper right extremity was performed according to the standard protocol. COMPARISON:  Right shoulder x-ray 12/17/2019 FINDINGS: Bones/Joint/Cartilage Mildly comminuted fracture of the anteroinferior glenoid rim with minimal medial displacement (series 6, image 56; series 4, images 23-29). Glenoid fracture fragment measures 1.9 x 0.6 cm. No fracture deformity of the humeral head to suggest a Hill-Sachs fracture. Glenohumeral joint is maintained without dislocation. Small glenohumeral joint effusion with a 3 mm fracture fragment within the axillary pouch (series 8, image 45). No significant arthropathy of the glenohumeral joint. Mild acromioclavicular osteoarthritis. No appreciable fluid collection within the subacromial-subdeltoid bursa. Ligaments Suboptimally assessed by CT. Muscles and Tendons Rotator cuff tendons appear grossly intact within the limitations of CT. No rotator cuff muscle atrophy or fatty infiltration. Soft tissues No soft tissue fluid collection or hematoma. No right axillary lymphadenopathy. IMPRESSION: 1. Mildly comminuted fracture of the anteroinferior glenoid rim with minimal medial displacement. 2. No additional fracture. Specifically, no impaction fracture identified involving the humeral head. 3. Small glenohumeral joint effusion with a 3 mm fracture fragment within the axillary pouch. Electronically Signed   By: Davina Poke D.O.   On: 12/17/2019 16:58   DG Knee Complete 4 Views Left  Result Date: 12/17/2019 CLINICAL DATA:  Status post fall. EXAM: LEFT KNEE - COMPLETE 4+ VIEW COMPARISON:  None. FINDINGS: No evidence of fracture,  dislocation, or joint effusion. Very mild medial tibiofemoral, lateral tibiofemoral and patellofemoral narrowing is seen. Soft tissues are unremarkable. IMPRESSION: Very mild tricompartmental degenerative changes. Electronically Signed   By: Virgina Norfolk M.D.   On: 12/17/2019 15:48    ____________________________________________    PROCEDURES  Procedure(s) performed:    Procedures    Medications  HYDROcodone-acetaminophen (NORCO/VICODIN) 5-325 MG per tablet 1 tablet (1 tablet Oral Given 12/17/19 1714)     ____________________________________________   INITIAL IMPRESSION / ASSESSMENT AND PLAN / ED COURSE  Pertinent labs & imaging results that were available during my care of the patient were reviewed by me and considered in my medical decision making (see chart for details).  Review of the [place] CSRS was performed in accordance of the [place] prior to dispensing any controlled drugs.   Patient's diagnosis is consistent with glenoid fracture.  Vital signs and exam are reassuring.  Knee x-ray consistent with osteoarthritis.  Shoulder x-ray consistent with possible nondisplaced glenoid fracture.  CT ordered to further evaluate.  CT scan remarkable for a mildly comminuted fracture of the anterior inferior glenoid rim with medial displacement.  Patient denies any back pain, shortness of breath, chest pain.  CT scan findings were discussed with Dr. Mack Guise, who recommends shoulder immobilizer and outpatient follow-up in about 1 week.  Patient states that she has seen Dr. Rudene Christians before in the past and would also like referral for him.  Shoulder immobilizer was placed.  Patient will be discharged home with prescriptions for a short course of Vicodin. Patient is to follow up with orthopedics as directed. Patient is given ED precautions to return to the ED for any worsening or new symptoms.   Mary [name] was evaluated in Emergency Department on 12/17/2019 for the symptoms described in the  history of present illness. She was evaluated in the context of the global COVID-19 pandemic, which necessitated consideration that the patient might be at risk for infection with the SARS-CoV-2 virus that causes COVID-19. Institutional protocols and algorithms that pertain to the evaluation of patients at risk for COVID-19 are in a state of rapid change based  on information released by regulatory bodies including the CDC and federal and state organizations. These policies and algorithms were followed during the patient's care in the ED.  ____________________________________________  FINAL CLINICAL IMPRESSION(S) / ED DIAGNOSES  Final diagnoses:  Closed fracture of rim of glenoid fossa of right scapula, initial encounter      NEW MEDICATIONS STARTED DURING THIS VISIT:  ED Discharge Orders         Ordered    HYDROcodone-acetaminophen (NORCO/VICODIN) 5-325 MG tablet  Every 8 hours PRN     12/17/19 1739              This chart was dictated using voice recognition software/Dragon. Despite best efforts to proofread, errors can occur which can change the meaning. Any change was purely unintentional.    Laban Emperor, PA-C 12/17/19 2023    Lilia Pro., MD 12/17/19 2104

## 2019-12-24 DIAGNOSIS — S86919A Strain of unspecified muscle(s) and tendon(s) at lower leg level, unspecified leg, initial encounter: Secondary | ICD-10-CM | POA: Insufficient documentation

## 2019-12-27 ENCOUNTER — Other Ambulatory Visit: Payer: Self-pay | Admitting: Registered Nurse

## 2019-12-27 ENCOUNTER — Ambulatory Visit: Payer: 59

## 2019-12-27 DIAGNOSIS — G4700 Insomnia, unspecified: Secondary | ICD-10-CM

## 2019-12-27 NOTE — Telephone Encounter (Signed)
COB pt. Thanks!

## 2019-12-30 ENCOUNTER — Other Ambulatory Visit: Payer: Self-pay | Admitting: Orthopedic Surgery

## 2019-12-30 DIAGNOSIS — R2241 Localized swelling, mass and lump, right lower limb: Secondary | ICD-10-CM

## 2020-01-07 ENCOUNTER — Ambulatory Visit: Payer: 59

## 2020-01-08 ENCOUNTER — Other Ambulatory Visit: Payer: Self-pay

## 2020-01-08 ENCOUNTER — Ambulatory Visit: Payer: Self-pay

## 2020-01-08 DIAGNOSIS — E079 Disorder of thyroid, unspecified: Secondary | ICD-10-CM

## 2020-01-08 DIAGNOSIS — E538 Deficiency of other specified B group vitamins: Secondary | ICD-10-CM

## 2020-01-08 DIAGNOSIS — Z6841 Body Mass Index (BMI) 40.0 and over, adult: Secondary | ICD-10-CM

## 2020-01-09 ENCOUNTER — Ambulatory Visit: Payer: Self-pay

## 2020-01-09 DIAGNOSIS — E538 Deficiency of other specified B group vitamins: Secondary | ICD-10-CM

## 2020-01-09 LAB — HEPATIC FUNCTION PANEL
ALT: 14 IU/L (ref 0–32)
AST: 13 IU/L (ref 0–40)
Albumin: 3.9 g/dL (ref 3.8–4.9)
Alkaline Phosphatase: 127 IU/L — ABNORMAL HIGH (ref 48–121)
Bilirubin Total: <0.2 mg/dL (ref 0.0–1.2)
Bilirubin, Direct: 0.06 mg/dL (ref 0.00–0.40)
Total Protein: 6.4 g/dL (ref 6.0–8.5)

## 2020-01-09 LAB — HEMOGLOBIN A1C
Est. average glucose Bld gHb Est-mCnc: 126 mg/dL
Hgb A1c MFr Bld: 6 % — ABNORMAL HIGH (ref 4.8–5.6)

## 2020-01-09 LAB — TSH: TSH: 3.77 u[IU]/mL (ref 0.450–4.500)

## 2020-01-15 ENCOUNTER — Ambulatory Visit: Payer: 59

## 2020-01-17 ENCOUNTER — Other Ambulatory Visit: Payer: Self-pay

## 2020-01-17 ENCOUNTER — Ambulatory Visit: Payer: Self-pay | Admitting: Physician Assistant

## 2020-01-17 ENCOUNTER — Encounter: Payer: Self-pay | Admitting: Physician Assistant

## 2020-01-17 VITALS — BP 127/71 | HR 96 | Temp 97.2°F | Resp 16 | Ht 66.5 in | Wt 279.0 lb

## 2020-01-17 DIAGNOSIS — E039 Hypothyroidism, unspecified: Secondary | ICD-10-CM

## 2020-01-17 DIAGNOSIS — Z712 Person consulting for explanation of examination or test findings: Secondary | ICD-10-CM

## 2020-01-17 NOTE — Progress Notes (Signed)
   Subjective: Hypothyroidism    Patient ID: Mary [name], female    DOB: 1960-11-07, 59 y.o.   MRN: [id number]  HPI Patient presents to the for evaluation and discussion however recent lab results.    Review of Systems Hypertension, hypothyroidism, obstructive sleep apnea, palpitations, and peripheral edema.    Objective:   Physical Exam No acute distress.  BMI is 44.36.  HEENT was unremarkable.  Neck supple without bruits or adenopathy.  Lungs clear to auscultation.  Heart is regular rate and rhythm.       Assessment & Plan: Hypothyroidism.  Discussed patient recent lab results consisting of hepatic function showing a slight elevation alkaline phosphatase at 127.  Hemoglobin A1c was 6.0 which consistent with prediabetes.TSH was 4.610.  Patient has not been evaluated by endocrinologist in over 20 years.  Consult will be generated secondary to the patient's request.

## 2020-01-17 NOTE — Addendum Note (Signed)
Addended by: Aliene Altes on: 01/17/2020 04:20 PM   Modules accepted: Orders

## 2020-01-24 ENCOUNTER — Other Ambulatory Visit: Payer: Self-pay

## 2020-01-24 ENCOUNTER — Ambulatory Visit: Payer: Self-pay

## 2020-01-24 DIAGNOSIS — E538 Deficiency of other specified B group vitamins: Secondary | ICD-10-CM

## 2020-01-31 ENCOUNTER — Other Ambulatory Visit: Payer: Self-pay

## 2020-01-31 ENCOUNTER — Other Ambulatory Visit: Payer: Self-pay | Admitting: Emergency Medicine

## 2020-01-31 ENCOUNTER — Ambulatory Visit
Admission: RE | Admit: 2020-01-31 | Discharge: 2020-01-31 | Disposition: A | Discharge status: Home / Self Care | Payer: No Typology Code available for payment source | Source: Ambulatory Visit | Attending: Emergency Medicine | Admitting: Emergency Medicine

## 2020-01-31 ENCOUNTER — Telehealth: Payer: Self-pay | Admitting: Emergency Medicine

## 2020-01-31 DIAGNOSIS — H81391 Other peripheral vertigo, right ear: Secondary | ICD-10-CM | POA: Insufficient documentation

## 2020-01-31 DIAGNOSIS — R42 Dizziness and giddiness: Secondary | ICD-10-CM

## 2020-01-31 MED ORDER — MECLIZINE HCL 25 MG PO TABS
25.0000 mg | ORAL_TABLET | Freq: Three times a day (TID) | ORAL | 1 refills | Status: DC | PRN
Start: 2020-01-31 — End: 2022-04-13

## 2020-01-31 NOTE — Progress Notes (Signed)
Patient called clinic to report that she is "having some vertigo at night when I turn over to my right side. It started last week when I was finally able to sleep on my right side."   Dr. Jimmye Norman ordered Head CT wo contrast and sent meclizine to pharmacy.

## 2020-01-31 NOTE — Telephone Encounter (Signed)
Patient having vertigo, meclizine called in

## 2020-02-03 ENCOUNTER — Encounter: Payer: Self-pay | Admitting: Emergency Medicine

## 2020-02-03 ENCOUNTER — Ambulatory Visit: Payer: Self-pay | Admitting: Emergency Medicine

## 2020-02-03 ENCOUNTER — Other Ambulatory Visit: Payer: Self-pay

## 2020-02-03 VITALS — BP 138/40 | HR 81 | Temp 97.7°F | Resp 14 | Ht 66.5 in | Wt 275.0 lb

## 2020-02-03 DIAGNOSIS — H8111 Benign paroxysmal vertigo, right ear: Secondary | ICD-10-CM

## 2020-02-03 NOTE — Progress Notes (Signed)
  Subjective:     Patient ID: Mary [name], female   DOB: 27-Nov-1960, 59 y.o.   MRN: [id number]  HPI Is here for reevaluation of her vertigo.  Patient states that the methacholine that was called in causes drowsiness so therefore she takes it at bedtime.  She continues to have vertigo especially if she moves quickly to the right.  It is also positional and that if she changes from a sitting to standing position the room "start spinning".  Patient states that she had this several years ago and was resolved after seeing Dr. Tami Ribas.  She states that this feels very much similar to that same episode.  Review of Systems  Negative for fever or chills. Negative for vision changes. Negative for ear pain.  Positive for dizziness. Positive right arm pain secondary to injury.     Objective:   Physical Exam  Eyes:  Conjunctiva clear bilaterally.  PERRLA.  Ears: EACs are clear bilaterally.  TMs are dull but no erythema or injection is noted.  Nose: No congestion or rhinorrhea noted.  Lungs: Clear bilaterally.   Heart: Regular rate and rhythm without murmur. Assessment:     Persistent vertigo    Plan:     Patient will continue taking the meclizine at bedtime.  Will make a recommendation that she be seen by Dr. Tami Ribas at [facility] for her continued dizziness.

## 2020-02-03 NOTE — Progress Notes (Signed)
Pt had CT scan for vertigo and following up with results. Pt states that she cant tell the medicine is helping with vertigo but it does help with sleep. CL,RMA

## 2020-02-04 ENCOUNTER — Other Ambulatory Visit: Payer: Self-pay

## 2020-02-04 DIAGNOSIS — H8111 Benign paroxysmal vertigo, right ear: Secondary | ICD-10-CM

## 2020-02-06 ENCOUNTER — Telehealth: Payer: Self-pay

## 2020-02-06 DIAGNOSIS — H8111 Benign paroxysmal vertigo, right ear: Secondary | ICD-10-CM

## 2020-02-06 NOTE — Telephone Encounter (Signed)
Referral to ENT for vertigo

## 2020-02-21 ENCOUNTER — Other Ambulatory Visit: Payer: Self-pay

## 2020-02-21 ENCOUNTER — Ambulatory Visit: Payer: Self-pay

## 2020-02-21 DIAGNOSIS — E538 Deficiency of other specified B group vitamins: Secondary | ICD-10-CM

## 2020-02-21 MED ORDER — CYANOCOBALAMIN 1000 MCG/ML IJ SOLN: 1000.0000 ug | INTRAMUSCULAR | 0 refills | Status: DC

## 2020-02-21 NOTE — Progress Notes (Signed)
B-12 given.  Tolerated well.

## 2020-03-04 ENCOUNTER — Other Ambulatory Visit: Payer: Self-pay | Admitting: Internal Medicine

## 2020-03-04 DIAGNOSIS — E559 Vitamin D deficiency, unspecified: Secondary | ICD-10-CM

## 2020-03-05 ENCOUNTER — Encounter: Payer: Self-pay | Admitting: Emergency Medicine

## 2020-03-05 ENCOUNTER — Ambulatory Visit: Payer: Self-pay | Admitting: Emergency Medicine

## 2020-03-05 ENCOUNTER — Other Ambulatory Visit: Payer: Self-pay

## 2020-03-05 VITALS — BP 146/80 | HR 82 | Temp 98.9°F | Resp 16 | Ht 66.5 in | Wt 280.0 lb

## 2020-03-05 DIAGNOSIS — E538 Deficiency of other specified B group vitamins: Secondary | ICD-10-CM

## 2020-03-05 DIAGNOSIS — R609 Edema, unspecified: Secondary | ICD-10-CM

## 2020-03-05 DIAGNOSIS — G4700 Insomnia, unspecified: Secondary | ICD-10-CM

## 2020-03-05 MED ORDER — CYANOCOBALAMIN 1000 MCG/ML IJ SOLN: 1000.0000 ug | INTRAMUSCULAR | 0 refills | Status: DC

## 2020-03-05 MED ORDER — ZOLPIDEM TARTRATE ER 12.5 MG PO TBCR: EXTENDED_RELEASE_TABLET | ORAL | 1 refills | Status: DC

## 2020-03-05 MED ORDER — FUROSEMIDE 20 MG PO TABS
10.0000 mg | ORAL_TABLET | Freq: Every day | ORAL | 3 refills | Status: DC
Start: 2020-03-05 — End: 2020-11-04

## 2020-03-05 NOTE — Progress Notes (Signed)
Pt states she's been having trouble sleeping for a while and is currently being winged off of Ambien. Also pt legs have been swelling more then usual post accident. CL,RMA

## 2020-03-05 NOTE — Progress Notes (Signed)
Occupational Health Provider Note       Time seen: 1:32 PM    I have reviewed the vital signs and the nursing notes.  HISTORY   Chief Complaint Insomnia    HPI Mary [name] is a 59 y.o. female with a history of anemia, arthritis, hyperlipidemia, hypertension, hypothyroidism, panic attacks, pneumonia, peripheral vascular disease who presents today for peripheral edema that has been chronic but seems to be worsening. She is also having trouble sleeping since they are trying to wean her off Ambien. She states she had previously been on Ambien for many years but previous doctor was trying to wean her off.  Past Medical History:  Diagnosis Date  . Anemia    low iron at times  . Arthritis   . Colon polyps   . Complication of anesthesia 1991   Pt reports she awoke during plantar fascia surgery and felt "everything"  . Heart murmur    " small per patient"   . Hyperlipidemia   . Hypertension   . Hypothyroidism   . Lactose intolerance   . PAC (premature atrial contraction)   . Palpitations   . Panic attacks    Pt reports restrictive clothing/areas and dry mouth cause her to have attack.  . Peripheral vascular disease ([place])    "small" clot after thermal ablasion  . Pneumonia 2016   "walking pneumonia" 3 times in 2016  . Shortness of breath    going upstairs (due to weight). not so much since weight loss  . Sleep apnea    after weight loss does not have to use cpap  . Vitamin B12 deficiency   . Vitamin D deficiency     Past Surgical History:  Procedure Laterality Date  . APPENDECTOMY  1985  . CESAREAN SECTION  1987   twins  . COLONOSCOPY    . COLONOSCOPY WITH PROPOFOL N/A 02/27/2018   Procedure: COLONOSCOPY WITH PROPOFOL;  Surgeon: Toledo, Benay Pike, MD;  Location: [facility];  Service: Gastroenterology;  Laterality: N/A;  . IRRIGATION AND DEBRIDEMENT HEMATOMA Right 11/12/2015   Procedure: IRRIGATION AND DEBRIDEMENT HEMATOMA;  Surgeon: Robert Bellow, MD;   Location: [facility];  Service: General;  Laterality: Right;  . LAMINECTOMY  07/10/2013   L4   L5   DR Ronnald Ramp  . LAPAROSCOPIC SALPINGO OOPHERECTOMY Right 04/17/2014   Procedure: LAPAROSCOPIC SALPINGO OOPHORECTOMY RIGHT;  Surgeon: Everitt Amber, MD;  Location: [facility];  Service: Gynecology;  Laterality: Right;  . LYSIS OF ADHESION  04/17/2014   Procedure: LYSIS OF ADHESION;  Surgeon: Everitt Amber, MD;  Location: [facility];  Service: Gynecology;;  . MAXIMUM ACCESS (MAS)POSTERIOR LUMBAR INTERBODY FUSION (PLIF) 1 LEVEL  07/10/2013   Procedure: FOR MAXIMUM ACCESS (MAS) POSTERIOR LUMBAR INTERBODY FUSION (PLIF) LUMBAR FOUR-FIVE;  Surgeon: Eustace Moore, MD;  [facility];  Service: Neurosurgery;;  FOR MAXIMUM ACCESS (MAS) POSTERIOR LUMBAR INTERBODY FUSION (PLIF) LUMBAR FOUR-FIVE  . OOPHORECTOMY     LSO  . OPEN REDUCTION INTERNAL FIXATION (ORIF) DISTAL RADIAL FRACTURE Left 09/20/2018   Procedure: OPEN REDUCTION INTERNAL FIXATION (ORIF) DISTAL RADIAL FRACTURE, LEFT;  Surgeon: Hessie Knows, MD;  Location: [facility];  Service: Orthopedics;  Laterality: Left;  . PLANTAR FASCIA SURGERY  1999  . 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 VAGINAL HYSTERECTOMY  2001   for bleeding and LSO for cyst    Allergies Bee venom, Citalopram, Lactose intolerance (gi), and Tape   Review of Systems Constitutional: Negative for fever. Positive for difficulty sleeping,  fatigue Cardiovascular: Negative for chest pain. Respiratory: Negative for shortness of breath. Gastrointestinal: Negative for abdominal pain, vomiting and diarrhea. Musculoskeletal: Positive for peripheral edema Skin: Negative for rash. Neurological: Negative for headaches, focal weakness or numbness.  All systems negative/normal/unremarkable except as stated in the HPI  ____________________________________________   PHYSICAL EXAM:  VITAL SIGNS: Vitals:   03/05/20 1311  BP: (!) 146/80  Pulse: 82  Resp: 16  Temp: 98.9 F (37.2 C)  SpO2: 97%     Constitutional: Alert and oriented. Well appearing and in no distress. Eyes: Conjunctivae are normal. Normal extraocular movements. Cardiovascular: Normal rate, regular rhythm. No murmurs, rubs, or gallops. Respiratory: Normal respiratory effort without tachypnea nor retractions. Breath sounds are clear and equal bilaterally. No wheezes/rales/rhonchi. Gastrointestinal: Soft and nontender. Normal bowel sounds Musculoskeletal: Nontender with normal range of motion in extremities. Bilateral peripheral edema is noted Neurologic:  Normal speech and language. No gross focal neurologic deficits are appreciated.  Skin:  Skin is warm, dry and intact. No rash noted. Psychiatric: Speech and behavior are normal.  ____________________________________________   LABS (pertinent positives/negatives)  Recent Results (from the past 2160 hour(s))  TSH     Status: None   Collection Time: 01/08/20  3:30 PM  Result Value Ref Range   TSH 3.770 0.450 - 4.500 uIU/mL  Hemoglobin A1C     Status: Abnormal   Collection Time: 01/08/20  3:30 PM  Result Value Ref Range   Hgb A1c MFr Bld 6.0 (H) 4.8 - 5.6 %    Comment:          Prediabetes: 5.7 - 6.4          Diabetes: >6.4          Glycemic control for adults with diabetes: <7.0    Est. average glucose Bld gHb Est-mCnc 126 mg/dL  Hepatic function panel     Status: Abnormal   Collection Time: 01/08/20  3:30 PM  Result Value Ref Range   Total Protein 6.4 6.0 - 8.5 g/dL   Albumin 3.9 3.8 - 4.9 g/dL   Bilirubin Total <0.2 0.0 - 1.2 mg/dL   Bilirubin, Direct 0.06 0.00 - 0.40 mg/dL   Alkaline Phosphatase 127 (H) 48 - 121 IU/L   AST 13 0 - 40 IU/L   ALT 14 0 - 32 IU/L     ASSESSMENT AND PLAN  Peripheral edema, insomnia   Plan: The patient had presented for peripheral edema and insomnia. Patient's labs recently have been grossly unremarkable, kidney function looks normal. She is following up soon with an endocrinologist to evaluate her thyroid function. I  will start her on Lasix every other day at a low dose and see how she tolerates that. I will also refill her Ambien since it has worked so well for her in the past..   Lenise Arena MD    Note: This note was generated in part or whole with voice recognition software. Voice recognition is usually quite accurate but there are transcription errors that can and very often do occur. I apologize for any typographical errors that were not detected and corrected.

## 2020-03-06 ENCOUNTER — Ambulatory Visit: Payer: 59

## 2020-03-20 ENCOUNTER — Ambulatory Visit: Payer: Self-pay

## 2020-03-20 ENCOUNTER — Other Ambulatory Visit: Payer: Self-pay

## 2020-03-23 ENCOUNTER — Other Ambulatory Visit: Payer: Self-pay

## 2020-03-23 DIAGNOSIS — D509 Iron deficiency anemia, unspecified: Secondary | ICD-10-CM

## 2020-03-23 NOTE — Progress Notes (Signed)
pt presented today to check for low iron due to bruises and fatigue. Pt show signsof faint white color in lower part of eyes per [facility] stone. CL,RMA

## 2020-03-24 LAB — IRON AND TIBC
Iron Saturation: 18 % (ref 15–55)
Iron: 72 ug/dL (ref 27–159)
Total Iron Binding Capacity: 393 ug/dL (ref 250–450)
UIBC: 321 ug/dL (ref 131–425)

## 2020-03-31 DIAGNOSIS — R000 Tachycardia, unspecified: Secondary | ICD-10-CM | POA: Diagnosis not present

## 2020-03-31 DIAGNOSIS — E039 Hypothyroidism, unspecified: Secondary | ICD-10-CM | POA: Diagnosis not present

## 2020-04-03 ENCOUNTER — Other Ambulatory Visit: Payer: Self-pay

## 2020-04-03 ENCOUNTER — Ambulatory Visit: Payer: Self-pay

## 2020-04-03 DIAGNOSIS — E538 Deficiency of other specified B group vitamins: Secondary | ICD-10-CM

## 2020-04-03 NOTE — Progress Notes (Signed)
Pt presented today for B12 injection. CL,rma

## 2020-04-07 DIAGNOSIS — R000 Tachycardia, unspecified: Secondary | ICD-10-CM | POA: Diagnosis not present

## 2020-04-16 ENCOUNTER — Other Ambulatory Visit: Payer: Self-pay | Admitting: Emergency Medicine

## 2020-04-16 DIAGNOSIS — G4700 Insomnia, unspecified: Secondary | ICD-10-CM

## 2020-04-17 ENCOUNTER — Ambulatory Visit: Payer: Self-pay

## 2020-04-17 ENCOUNTER — Other Ambulatory Visit: Payer: Self-pay

## 2020-04-17 DIAGNOSIS — E538 Deficiency of other specified B group vitamins: Secondary | ICD-10-CM

## 2020-05-01 ENCOUNTER — Other Ambulatory Visit: Payer: Self-pay

## 2020-05-01 ENCOUNTER — Ambulatory Visit: Payer: Self-pay

## 2020-05-01 DIAGNOSIS — Z23 Encounter for immunization: Secondary | ICD-10-CM

## 2020-05-01 DIAGNOSIS — E538 Deficiency of other specified B group vitamins: Secondary | ICD-10-CM

## 2020-05-01 MED ORDER — CYANOCOBALAMIN 1000 MCG/ML IJ SOLN
1000.0000 ug | INTRAMUSCULAR | Status: AC
Start: 2020-05-01 — End: 2020-10-16
  Administered 2020-05-01 – 2020-07-09 (×5): 1000 ug via INTRAMUSCULAR

## 2020-05-01 NOTE — Progress Notes (Signed)
B-12 administered without difficulty.

## 2020-05-13 ENCOUNTER — Other Ambulatory Visit: Payer: Self-pay | Admitting: Physician Assistant

## 2020-05-13 DIAGNOSIS — E039 Hypothyroidism, unspecified: Secondary | ICD-10-CM

## 2020-05-15 ENCOUNTER — Ambulatory Visit: Payer: Self-pay

## 2020-05-15 ENCOUNTER — Other Ambulatory Visit: Payer: Self-pay

## 2020-05-15 DIAGNOSIS — E538 Deficiency of other specified B group vitamins: Secondary | ICD-10-CM

## 2020-05-25 ENCOUNTER — Other Ambulatory Visit: Payer: Self-pay

## 2020-05-25 DIAGNOSIS — E079 Disorder of thyroid, unspecified: Secondary | ICD-10-CM

## 2020-05-27 ENCOUNTER — Telehealth: Payer: Self-pay

## 2020-05-27 NOTE — Telephone Encounter (Signed)
Request already in Epic - re-routed to Ron Smith, PA-C.  AMD 

## 2020-05-28 ENCOUNTER — Telehealth: Payer: Self-pay

## 2020-05-28 MED ORDER — LEVOTHYROXINE SODIUM 100 MCG PO TABS: 100.0000 ug | ORAL_TABLET | Freq: Every day | ORAL | 1 refills | Status: DC

## 2020-05-29 ENCOUNTER — Ambulatory Visit: Payer: Self-pay

## 2020-05-29 ENCOUNTER — Other Ambulatory Visit: Payer: Self-pay

## 2020-05-29 DIAGNOSIS — E538 Deficiency of other specified B group vitamins: Secondary | ICD-10-CM

## 2020-06-12 ENCOUNTER — Ambulatory Visit: Payer: 59

## 2020-06-26 ENCOUNTER — Ambulatory Visit: Payer: Self-pay

## 2020-06-26 ENCOUNTER — Other Ambulatory Visit: Payer: Self-pay

## 2020-06-26 DIAGNOSIS — E538 Deficiency of other specified B group vitamins: Secondary | ICD-10-CM

## 2020-07-06 ENCOUNTER — Other Ambulatory Visit: Payer: Self-pay | Admitting: Orthopedic Surgery

## 2020-07-06 ENCOUNTER — Encounter: Payer: Self-pay | Admitting: Orthopedic Surgery

## 2020-07-06 ENCOUNTER — Encounter
Admission: RE | Admit: 2020-07-06 | Discharge: 2020-07-06 | Disposition: A | Discharge status: Home / Self Care | Payer: 59 | Source: Ambulatory Visit | Attending: Orthopedic Surgery | Admitting: Orthopedic Surgery

## 2020-07-06 NOTE — H&P (Signed)
Mary [name] MRN:  [id number] DOB/SEX:  08-06-1960/female  CHIEF COMPLAINT:  Painful left Knee  HISTORY: Patient is a 59 y.o. female presented with a history of pain in the left knee. Onset of symptoms was abrupt starting several weeks ago with gradually worsening course since that time. Prior procedures on the knee include none. Patient has been treated conservatively with over-the-counter NSAIDs and activity modification. Patient currently rates pain in the knee at 10 out of 10 with activity. There is pain at night.  PAST MEDICAL HISTORY: Patient Active Problem List   Diagnosis Date Noted  . Strain of knee 12/24/2019  . Vitamin B12 deficiency 10/08/2019  . Arthritis 07/16/2019  . Edema 07/16/2019  . History of abdominal pain 07/16/2019  . Low back pain 07/16/2019  . Vitamin D deficiency 07/16/2019  . Thyroid disease 02/28/2019  . History of gastric bypass 02/28/2019  . Obstructive sleep apnea syndrome 02/28/2019  . Insomnia 02/28/2019  . Class 3 severe obesity due to excess calories with serious comorbidity and body mass index (BMI) of 40.0 to 44.9 in adult [facility]) 02/28/2019  . Elevated BP without diagnosis of hypertension 02/28/2019  . Other hyperlipidemia 02/28/2019  . Hot flashes 02/28/2019  . BMI 40.0-44.9, adult ([place]) 12/16/2015  . Hematoma of lower extremity 11/12/2015  . Essential hypertension 06/20/2014  . Pure hypercholesterolemia 06/20/2014  . S/P lumbar spinal fusion 07/10/2013  . Arthrodesis status 07/10/2013  . Palpitations 01/02/2012  . Edema of both legs 01/02/2012   Past Medical History:  Diagnosis Date  . Anemia    low iron at times  . Arthritis   . Colon polyps   . Complication of anesthesia 1991   Pt reports she awoke during plantar fascia surgery and felt "everything"  . Heart murmur    " small per patient"   . Hyperlipidemia   . Hypertension   . Hypothyroidism   . Lactose intolerance   . PAC (premature atrial contraction)   . Palpitations   .  Panic attacks    Pt reports restrictive clothing/areas and dry mouth cause her to have attack.  . Peripheral vascular disease ([place])    "small" clot after thermal ablasion  . Pneumonia 2016   "walking pneumonia" 3 times in 2016  . Shortness of breath    going upstairs (due to weight). not so much since weight loss  . Sleep apnea    after weight loss does not have to use cpap  . Vitamin B12 deficiency   . Vitamin D deficiency    Past Surgical History:  Procedure Laterality Date  . APPENDECTOMY  1985  . CESAREAN SECTION  1987   twins  . COLONOSCOPY    . COLONOSCOPY WITH PROPOFOL N/A 02/27/2018   Procedure: COLONOSCOPY WITH PROPOFOL;  Surgeon: Toledo, Benay Pike, MD;  Location: [facility];  Service: Gastroenterology;  Laterality: N/A;  . IRRIGATION AND DEBRIDEMENT HEMATOMA Right 11/12/2015   Procedure: IRRIGATION AND DEBRIDEMENT HEMATOMA;  Surgeon: Robert Bellow, MD;  Location: [facility];  Service: General;  Laterality: Right;  . LAMINECTOMY  07/10/2013   L4   L5   DR Ronnald Ramp  . LAPAROSCOPIC SALPINGO OOPHERECTOMY Right 04/17/2014   Procedure: LAPAROSCOPIC SALPINGO OOPHORECTOMY RIGHT;  Surgeon: Everitt Amber, MD;  Location: [facility];  Service: Gynecology;  Laterality: Right;  . LYSIS OF ADHESION  04/17/2014   Procedure: LYSIS OF ADHESION;  Surgeon: Everitt Amber, MD;  Location: [facility];  Service: Gynecology;;  . MAXIMUM ACCESS (MAS)POSTERIOR LUMBAR INTERBODY FUSION (PLIF) 1  LEVEL  07/10/2013   Procedure: FOR MAXIMUM ACCESS (MAS) POSTERIOR LUMBAR INTERBODY FUSION (PLIF) LUMBAR FOUR-FIVE;  Surgeon: Eustace Moore, MD;  Location: [facility];  Service: Neurosurgery;;  FOR MAXIMUM ACCESS (MAS) POSTERIOR LUMBAR INTERBODY FUSION (PLIF) LUMBAR FOUR-FIVE  . OOPHORECTOMY     LSO  . OPEN REDUCTION INTERNAL FIXATION (ORIF) DISTAL RADIAL FRACTURE Left 09/20/2018   Procedure: OPEN REDUCTION INTERNAL FIXATION (ORIF) DISTAL RADIAL FRACTURE, LEFT;  Surgeon: Hessie Knows, MD;  Location: [facility];  Service:  Orthopedics;  Laterality: Left;  . PLANTAR FASCIA SURGERY  1999  . 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 VAGINAL HYSTERECTOMY  2001   for bleeding and LSO for cyst     MEDICATIONS:  (Not in a hospital admission)   ALLERGIES:   Allergies  Allergen Reactions  . Bee Venom Anaphylaxis  . Citalopram Nausea Only  . Lactose Intolerance (Gi) Diarrhea    Bloating, upset stomach  . Tape Other (See Comments)    Thin skin, rips off skin    REVIEW OF SYSTEMS:  Pertinent items are noted in HPI.   FAMILY HISTORY:   Family History  Problem Relation Age of Onset  . Heart disease Mother   . Lung cancer Mother   . Diabetes Mother        later in life  . Coronary artery disease Mother   . Heart disease Father   . Arrhythmia Sister   . Breast cancer Sister 27       diagnosed 09/09/2016  . Colonic polyp Brother        pre cancer    SOCIAL HISTORY:   Social History   Tobacco Use  . Smoking status: Former Smoker    Packs/day: 0.30    Years: 4.00    Pack years: 1.20    Quit date: 07/11/2002    Years since quitting: 18.0  . Smokeless tobacco: Never Used  Substance Use Topics  . Alcohol use: Not Currently    Alcohol/week: 1.0 standard drink    Types: 1 Standard drinks or equivalent per week     EXAMINATION:  Vital signs in last 24 hours: @VSRANGES @  General appearance: alert, cooperative and no distress Neck: no JVD and supple, symmetrical, trachea midline Lungs: clear to auscultation bilaterally Heart: regular rate and rhythm, S1, S2 normal, no murmur, click, rub or gallop Abdomen: soft, non-tender; bowel sounds normal; no masses,  no organomegaly Extremities: extremities normal, atraumatic, no cyanosis or edema and Homans sign is negative, no sign of DVT Pulses: 2+ and symmetric Skin: Skin color, texture, turgor normal. No rashes or lesions Neurologic: Alert and oriented X 3, normal strength and tone. Normal symmetric reflexes. 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 The overall alignment is neutral. The bone quality appears to be good for age and reported activity level.  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 The patient has failed conservative treatment.  The clearance notes were reviewed.  After discussion with the patient it was felt that Left knee scopewas indicated. The procedure,  risks, and benefits of total knee arthroplasty were presented and reviewed. The risks including but not limited to aseptic loosening, infection, blood clots, vascular injury, stiffness, patella tracking problems complications among others were discussed. The patient  acknowledged the explanation, agreed to proceed with the plan.  Altamese Cabal 07/06/2020, 8:57 PM

## 2020-07-06 NOTE — Patient Instructions (Addendum)
Your procedure is scheduled on: 07/15/20 - Wednesday Report to the Registration Desk on the 1st floor of the Drain. To find out your arrival time, please call [phone] between 1PM - 3PM on: 07/14/20- Tuesday  REMEMBER: Instructions that are not followed completely may result in serious medical risk, up to and including death; or upon the discretion of your surgeon and anesthesiologist your surgery may need to be rescheduled.  Do not eat food after midnight the night before surgery.  No gum chewing, lozengers or hard candies.  You may however, drink CLEAR liquids up to 2 hours before you are scheduled to arrive for your surgery. Do not drink anything within 2 hours of your scheduled arrival time.  Clear liquids include: - water  - apple juice without pulp - gatorade (not RED, PURPLE, OR BLUE) - black coffee or tea (Do NOT add milk or creamers to the coffee or tea) Do NOT drink anything that is not on this list.  TAKE THESE MEDICATIONS THE MORNING OF SURGERY WITH A SIP OF WATER: - levothyroxine (SYNTHROID) 100 MCG tablet  One week prior to surgery: Stop Mobic 07/09/27/21. Stop Anti-inflammatories (NSAIDS) such as Advil, Aleve, Ibuprofen, Mobic, Motrin, Naproxen, Naprosyn and Aspirin based products such as Excedrin, Goodys Powder, BC Powder.  Stop ANY OVER THE COUNTER supplements until after surgery. (However, you may continue taking Vitamin D, Vitamin B, and multivitamin up until the day before surgery.)  No Alcohol for 24 hours before or after surgery.  No Smoking including e-cigarettes for 24 hours prior to surgery.  No chewable tobacco products for at least 6 hours prior to surgery.  No nicotine patches on the day of surgery.  Do not use any "recreational" drugs for at least a week prior to your surgery.  Please be advised that the combination of cocaine and anesthesia may have negative outcomes, up to and including death. If you test positive for cocaine, your  surgery will be cancelled.  On the morning of surgery brush your teeth with toothpaste and water, you may rinse your mouth with mouthwash if you wish. Do not swallow any toothpaste or mouthwash.  Do not wear jewelry, make-up, hairpins, clips or nail polish.  Do not wear lotions, powders, or perfumes.   Do not shave body from the neck down 48 hours prior to surgery just in case you cut yourself which could leave a site for infection.  Also, freshly shaved skin may become irritated if using the CHG soap.  Contact lenses, hearing aids and dentures may not be worn into surgery.  Do not bring valuables to the hospital. [facility] is not responsible for any missing/lost belongings or valuables.   Use CHG Soap or wipes as directed on instruction sheet.  Notify your doctor if there is any change in your medical condition (cold, fever, infection).  Wear comfortable clothing (specific to your surgery type) to the hospital.  Plan for stool softeners for home use; pain medications have a tendency to cause constipation. You can also help prevent constipation by eating foods high in fiber such as fruits and vegetables and drinking plenty of fluids as your diet allows.  After surgery, you can help prevent lung complications by doing breathing exercises.  Take deep breaths and cough every 1-2 hours. Your doctor may order a device called an Incentive Spirometer to help you take deep breaths. When coughing or sneezing, hold a pillow firmly against your incision with both hands. This is called splinting. Doing this  helps protect your incision. It also decreases belly discomfort.  If you are being admitted to the hospital overnight, leave your suitcase in the car. After surgery it may be brought to your room.  If you are being discharged the day of surgery, you will not be allowed to drive home. You will need a responsible adult (18 years or older) to drive you home and stay with you that night.    If you are taking public transportation, you will need to have a responsible adult (18 years or older) with you. Please confirm with your physician that it is acceptable to use public transportation.   Please call the Pre-admissions Testing Dept. at [phone] if you have any questions about these instructions.  Visitation Policy:  Patients undergoing a surgery or procedure may have one family member or support person with them as long as that person is not COVID-19 positive or experiencing its symptoms.  That person may remain in the waiting area during the procedure.  Inpatient Visitation Update:   In an effort to ensure the safety of our team members and our patients, we are implementing a change to our visitation policy:  Effective Monday, Aug. 9, at 7 a.m., inpatients will be allowed one support person.  o The support person may change daily.  o The support person must pass our screening, gel in and out, and wear a mask at all times, including in the patients room.  o Patients must also wear a mask when staff or their support person are in the room.  o Masking is required regardless of vaccination status.  Systemwide, no visitors 17 or younger.

## 2020-07-06 NOTE — H&P (Deleted)
  The note originally documented on this encounter has been moved the the encounter in which it belongs.  

## 2020-07-09 ENCOUNTER — Other Ambulatory Visit: Payer: Self-pay

## 2020-07-09 ENCOUNTER — Ambulatory Visit: Payer: 59

## 2020-07-09 ENCOUNTER — Ambulatory Visit: Payer: Self-pay

## 2020-07-09 DIAGNOSIS — E538 Deficiency of other specified B group vitamins: Secondary | ICD-10-CM

## 2020-07-13 ENCOUNTER — Encounter
Admission: RE | Admit: 2020-07-13 | Discharge: 2020-07-13 | Disposition: A | Discharge status: Home / Self Care | Payer: No Typology Code available for payment source | Source: Ambulatory Visit | Attending: Orthopedic Surgery | Admitting: Orthopedic Surgery

## 2020-07-13 ENCOUNTER — Other Ambulatory Visit: Payer: Self-pay

## 2020-07-13 DIAGNOSIS — Z01818 Encounter for other preprocedural examination: Secondary | ICD-10-CM | POA: Diagnosis not present

## 2020-07-13 DIAGNOSIS — I10 Essential (primary) hypertension: Secondary | ICD-10-CM | POA: Diagnosis not present

## 2020-07-13 DIAGNOSIS — Z20822 Contact with and (suspected) exposure to covid-19: Secondary | ICD-10-CM | POA: Diagnosis not present

## 2020-07-13 LAB — TYPE AND SCREEN
ABO/RH(D): B POS
Antibody Screen: NEGATIVE

## 2020-07-13 LAB — BASIC METABOLIC PANEL
Anion gap: 7 (ref 5–15)
BUN: 14 mg/dL (ref 6–20)
CO2: 25 mmol/L (ref 22–32)
Calcium: 9.2 mg/dL (ref 8.9–10.3)
Chloride: 108 mmol/L (ref 98–111)
Creatinine, Ser: 0.72 mg/dL (ref 0.44–1.00)
GFR, Estimated: >60 mL/min (ref >60)
Glucose, Bld: 89 mg/dL (ref 70–99)
Potassium: 4.1 mmol/L (ref 3.5–5.1)
Sodium: 140 mmol/L (ref 135–145)

## 2020-07-13 LAB — CBC
HCT: 40.9 % (ref 36.0–46.0)
Hemoglobin: 13.2 g/dL (ref 12.0–15.0)
MCH: 28 pg (ref 26.0–34.0)
MCHC: 32.3 g/dL (ref 30.0–36.0)
MCV: 86.8 fL (ref 80.0–100.0)
Platelets: 192 10*3/uL (ref 150–400)
RBC: 4.71 MIL/uL (ref 3.87–5.11)
RDW: 15.2 % (ref 11.5–15.5)
WBC: 7 10*3/uL (ref 4.0–10.5)
nRBC: 0 % (ref 0.0–0.2)

## 2020-07-13 LAB — SURGICAL PCR SCREEN
MRSA, PCR: NEGATIVE
Staphylococcus aureus: NEGATIVE

## 2020-07-14 LAB — SARS CORONAVIRUS 2 (TAT 6-24 HRS): SARS Coronavirus 2: NEGATIVE

## 2020-07-15 ENCOUNTER — Ambulatory Visit
Admission: RE | Admit: 2020-07-15 | Discharge: 2020-07-15 | Disposition: A | Discharge status: Home / Self Care | Payer: No Typology Code available for payment source | Source: Ambulatory Visit | Attending: Orthopedic Surgery | Admitting: Orthopedic Surgery

## 2020-07-15 ENCOUNTER — Encounter: Payer: Self-pay | Admitting: Orthopedic Surgery

## 2020-07-15 ENCOUNTER — Encounter
Admission: RE | Disposition: A | Discharge status: Home / Self Care | Payer: Self-pay | Source: Ambulatory Visit | Attending: Orthopedic Surgery

## 2020-07-15 ENCOUNTER — Other Ambulatory Visit: Payer: Self-pay

## 2020-07-15 ENCOUNTER — Ambulatory Visit: Payer: No Typology Code available for payment source | Admitting: Urgent Care

## 2020-07-15 DIAGNOSIS — Z9884 Bariatric surgery status: Secondary | ICD-10-CM | POA: Diagnosis not present

## 2020-07-15 DIAGNOSIS — M1712 Unilateral primary osteoarthritis, left knee: Secondary | ICD-10-CM | POA: Diagnosis not present

## 2020-07-15 DIAGNOSIS — Z981 Arthrodesis status: Secondary | ICD-10-CM | POA: Diagnosis not present

## 2020-07-15 DIAGNOSIS — M2242 Chondromalacia patellae, left knee: Secondary | ICD-10-CM | POA: Diagnosis not present

## 2020-07-15 DIAGNOSIS — X58XXXA Exposure to other specified factors, initial encounter: Secondary | ICD-10-CM | POA: Insufficient documentation

## 2020-07-15 DIAGNOSIS — Z8719 Personal history of other diseases of the digestive system: Secondary | ICD-10-CM | POA: Insufficient documentation

## 2020-07-15 DIAGNOSIS — S83232A Complex tear of medial meniscus, current injury, left knee, initial encounter: Secondary | ICD-10-CM | POA: Diagnosis present

## 2020-07-15 DIAGNOSIS — E7800 Pure hypercholesterolemia, unspecified: Secondary | ICD-10-CM | POA: Diagnosis not present

## 2020-07-15 DIAGNOSIS — I739 Peripheral vascular disease, unspecified: Secondary | ICD-10-CM | POA: Diagnosis not present

## 2020-07-15 DIAGNOSIS — Z801 Family history of malignant neoplasm of trachea, bronchus and lung: Secondary | ICD-10-CM | POA: Diagnosis not present

## 2020-07-15 DIAGNOSIS — S83272A Complex tear of lateral meniscus, current injury, left knee, initial encounter: Secondary | ICD-10-CM | POA: Diagnosis not present

## 2020-07-15 DIAGNOSIS — M2342 Loose body in knee, left knee: Secondary | ICD-10-CM | POA: Diagnosis not present

## 2020-07-15 DIAGNOSIS — Z87891 Personal history of nicotine dependence: Secondary | ICD-10-CM | POA: Diagnosis not present

## 2020-07-15 DIAGNOSIS — Z803 Family history of malignant neoplasm of breast: Secondary | ICD-10-CM | POA: Insufficient documentation

## 2020-07-15 DIAGNOSIS — S83282A Other tear of lateral meniscus, current injury, left knee, initial encounter: Secondary | ICD-10-CM | POA: Diagnosis not present

## 2020-07-15 DIAGNOSIS — S83242A Other tear of medial meniscus, current injury, left knee, initial encounter: Secondary | ICD-10-CM | POA: Diagnosis not present

## 2020-07-15 DIAGNOSIS — E559 Vitamin D deficiency, unspecified: Secondary | ICD-10-CM | POA: Diagnosis not present

## 2020-07-15 DIAGNOSIS — Z8249 Family history of ischemic heart disease and other diseases of the circulatory system: Secondary | ICD-10-CM | POA: Insufficient documentation

## 2020-07-15 DIAGNOSIS — Z888 Allergy status to other drugs, medicaments and biological substances status: Secondary | ICD-10-CM | POA: Insufficient documentation

## 2020-07-15 DIAGNOSIS — G4700 Insomnia, unspecified: Secondary | ICD-10-CM | POA: Diagnosis not present

## 2020-07-15 DIAGNOSIS — G4733 Obstructive sleep apnea (adult) (pediatric): Secondary | ICD-10-CM | POA: Diagnosis not present

## 2020-07-15 DIAGNOSIS — Z833 Family history of diabetes mellitus: Secondary | ICD-10-CM | POA: Insufficient documentation

## 2020-07-15 HISTORY — PX: KNEE ARTHROSCOPY WITH MEDIAL MENISECTOMY: SHX5651

## 2020-07-15 SURGERY — ARTHROSCOPY, KNEE, WITH MEDIAL MENISCECTOMY
Anesthesia: General | Site: Knee | Laterality: Left

## 2020-07-15 MED ORDER — ONDANSETRON HCL 4 MG/2ML IJ SOLN: 4.0000 mg | Freq: Once | INTRAMUSCULAR | Status: DC | PRN

## 2020-07-15 MED ORDER — SUCCINYLCHOLINE CHLORIDE 200 MG/10ML IV SOSY
PREFILLED_SYRINGE | INTRAVENOUS | Status: AC
  Filled 2020-07-15: qty 10

## 2020-07-15 MED ORDER — HYDROCODONE-ACETAMINOPHEN 5-325 MG PO TABS: 1.0000 | ORAL_TABLET | Freq: Four times a day (QID) | ORAL | 0 refills | Status: DC | PRN

## 2020-07-15 MED ORDER — ROPIVACAINE HCL 5 MG/ML IJ SOLN
INTRAMUSCULAR | Status: DC | PRN
  Administered 2020-07-15: 20 mL

## 2020-07-15 MED ORDER — OXYCODONE HCL 5 MG/5ML PO SOLN: 5.0000 mg | Freq: Once | ORAL | Status: AC | PRN

## 2020-07-15 MED ORDER — MIDAZOLAM HCL 2 MG/2ML IJ SOLN
INTRAMUSCULAR | Status: AC
  Filled 2020-07-15: qty 2

## 2020-07-15 MED ORDER — CHLORHEXIDINE GLUCONATE 0.12 % MT SOLN
OROMUCOSAL | Status: AC
  Filled 2020-07-15: qty 15

## 2020-07-15 MED ORDER — BUPIVACAINE-EPINEPHRINE (PF) 0.25% -1:200000 IJ SOLN
INTRAMUSCULAR | Status: AC
  Filled 2020-07-15: qty 30

## 2020-07-15 MED ORDER — LACTATED RINGERS IV SOLN: INTRAVENOUS | Status: DC

## 2020-07-15 MED ORDER — EPINEPHRINE PF 1 MG/ML IJ SOLN
INTRAMUSCULAR | Status: AC
  Filled 2020-07-15: qty 4

## 2020-07-15 MED ORDER — ONDANSETRON HCL 4 MG/2ML IJ SOLN
INTRAMUSCULAR | Status: AC
  Filled 2020-07-15: qty 2

## 2020-07-15 MED ORDER — SUGAMMADEX SODIUM 500 MG/5ML IV SOLN
INTRAVENOUS | Status: DC | PRN
  Administered 2020-07-15: 250 mg via INTRAVENOUS

## 2020-07-15 MED ORDER — ROPIVACAINE HCL 5 MG/ML IJ SOLN
INTRAMUSCULAR | Status: AC
  Filled 2020-07-15: qty 20

## 2020-07-15 MED ORDER — OXYCODONE HCL 5 MG PO TABS
ORAL_TABLET | ORAL | Status: AC
  Filled 2020-07-15: qty 1

## 2020-07-15 MED ORDER — SUGAMMADEX SODIUM 500 MG/5ML IV SOLN
INTRAVENOUS | Status: AC
  Filled 2020-07-15: qty 5

## 2020-07-15 MED ORDER — LIDOCAINE HCL (CARDIAC) PF 100 MG/5ML IV SOSY
PREFILLED_SYRINGE | INTRAVENOUS | Status: DC | PRN
  Administered 2020-07-15: 50 mg via INTRAVENOUS

## 2020-07-15 MED ORDER — ACETAMINOPHEN 325 MG PO TABS: 325.0000 mg | ORAL_TABLET | Freq: Four times a day (QID) | ORAL | Status: DC | PRN

## 2020-07-15 MED ORDER — HYDROMORPHONE HCL 1 MG/ML IJ SOLN
INTRAMUSCULAR | Status: AC
  Administered 2020-07-15: 0.5 mg via INTRAVENOUS
  Filled 2020-07-15: qty 1

## 2020-07-15 MED ORDER — HYDROMORPHONE HCL 1 MG/ML IJ SOLN
0.5000 mg | INTRAMUSCULAR | Status: DC | PRN
  Administered 2020-07-15: 0.5 mg via INTRAVENOUS

## 2020-07-15 MED ORDER — FENTANYL CITRATE (PF) 100 MCG/2ML IJ SOLN
INTRAMUSCULAR | Status: AC
  Filled 2020-07-15: qty 2

## 2020-07-15 MED ORDER — METHYLPREDNISOLONE ACETATE 40 MG/ML IJ SUSP
INTRAMUSCULAR | Status: DC | PRN
  Administered 2020-07-15: 40 mg via INTRA_ARTICULAR

## 2020-07-15 MED ORDER — HYDROCODONE-ACETAMINOPHEN 5-325 MG PO TABS: 1.0000 | ORAL_TABLET | ORAL | Status: DC | PRN

## 2020-07-15 MED ORDER — METHYLPREDNISOLONE ACETATE 40 MG/ML IJ SUSP
INTRAMUSCULAR | Status: AC
  Filled 2020-07-15: qty 1

## 2020-07-15 MED ORDER — KETOROLAC TROMETHAMINE 30 MG/ML IJ SOLN
INTRAMUSCULAR | Status: AC
  Filled 2020-07-15: qty 1

## 2020-07-15 MED ORDER — PROPOFOL 10 MG/ML IV BOLUS
INTRAVENOUS | Status: AC
  Filled 2020-07-15: qty 20

## 2020-07-15 MED ORDER — ACETAMINOPHEN 10 MG/ML IV SOLN: 1000.0000 mg | Freq: Once | INTRAVENOUS | Status: DC | PRN

## 2020-07-15 MED ORDER — DEXAMETHASONE SODIUM PHOSPHATE 10 MG/ML IJ SOLN
INTRAMUSCULAR | Status: AC
  Filled 2020-07-15: qty 1

## 2020-07-15 MED ORDER — MORPHINE SULFATE (PF) 2 MG/ML IV SOLN: 0.5000 mg | INTRAVENOUS | Status: DC | PRN

## 2020-07-15 MED ORDER — ROCURONIUM BROMIDE 100 MG/10ML IV SOLN
INTRAVENOUS | Status: DC | PRN
  Administered 2020-07-15: 30 mg via INTRAVENOUS

## 2020-07-15 MED ORDER — ONDANSETRON HCL 4 MG PO TABS: 4.0000 mg | ORAL_TABLET | Freq: Four times a day (QID) | ORAL | Status: DC | PRN

## 2020-07-15 MED ORDER — OXYCODONE HCL 5 MG PO TABS
5.0000 mg | ORAL_TABLET | Freq: Once | ORAL | Status: AC | PRN
Start: 2020-07-15 — End: 2020-07-15
  Administered 2020-07-15: 5 mg via ORAL

## 2020-07-15 MED ORDER — FENTANYL CITRATE (PF) 100 MCG/2ML IJ SOLN
INTRAMUSCULAR | Status: AC
  Administered 2020-07-15: 25 ug via INTRAVENOUS
  Filled 2020-07-15: qty 2

## 2020-07-15 MED ORDER — LACTATED RINGERS IV SOLN
INTRAVENOUS | Status: DC | PRN
  Administered 2020-07-15: 12000 mL

## 2020-07-15 MED ORDER — CHLORHEXIDINE GLUCONATE 0.12 % MT SOLN
15.0000 mL | Freq: Once | OROMUCOSAL | Status: AC
  Administered 2020-07-15: 15 mL via OROMUCOSAL

## 2020-07-15 MED ORDER — HYDROCODONE-ACETAMINOPHEN 7.5-325 MG PO TABS
1.0000 | ORAL_TABLET | ORAL | Status: DC | PRN
  Filled 2020-07-15: qty 2

## 2020-07-15 MED ORDER — SUCCINYLCHOLINE CHLORIDE 20 MG/ML IJ SOLN
INTRAMUSCULAR | Status: DC | PRN
  Administered 2020-07-15: 120 mg via INTRAVENOUS

## 2020-07-15 MED ORDER — KETOROLAC TROMETHAMINE 15 MG/ML IJ SOLN: 7.5000 mg | Freq: Four times a day (QID) | INTRAMUSCULAR | Status: DC

## 2020-07-15 MED ORDER — MIDAZOLAM HCL 2 MG/2ML IJ SOLN
INTRAMUSCULAR | Status: DC | PRN
  Administered 2020-07-15: 2 mg via INTRAVENOUS

## 2020-07-15 MED ORDER — PROPOFOL 10 MG/ML IV BOLUS
INTRAVENOUS | Status: DC | PRN
  Administered 2020-07-15: 200 mg via INTRAVENOUS

## 2020-07-15 MED ORDER — ONDANSETRON HCL 4 MG/2ML IJ SOLN
INTRAMUSCULAR | Status: DC | PRN
  Administered 2020-07-15: 4 mg via INTRAVENOUS

## 2020-07-15 MED ORDER — METOCLOPRAMIDE HCL 10 MG PO TABS: 5.0000 mg | ORAL_TABLET | Freq: Three times a day (TID) | ORAL | Status: DC | PRN

## 2020-07-15 MED ORDER — DEXAMETHASONE SODIUM PHOSPHATE 10 MG/ML IJ SOLN
INTRAMUSCULAR | Status: DC | PRN
  Administered 2020-07-15: 5 mg via INTRAVENOUS

## 2020-07-15 MED ORDER — KETOROLAC TROMETHAMINE 30 MG/ML IJ SOLN
INTRAMUSCULAR | Status: DC | PRN
  Administered 2020-07-15: 30 mg via INTRAVENOUS

## 2020-07-15 MED ORDER — ACETAMINOPHEN 10 MG/ML IV SOLN
INTRAVENOUS | Status: DC | PRN
  Administered 2020-07-15: 1000 mg via INTRAVENOUS

## 2020-07-15 MED ORDER — BUPIVACAINE-EPINEPHRINE (PF) 0.25% -1:200000 IJ SOLN
INTRAMUSCULAR | Status: DC | PRN
  Administered 2020-07-15: 20 mL via PERINEURAL

## 2020-07-15 MED ORDER — DEXTROSE 5 % IV SOLN
3.0000 g | INTRAVENOUS | Status: AC
  Administered 2020-07-15: 3 g via INTRAVENOUS
  Filled 2020-07-15: qty 3

## 2020-07-15 MED ORDER — FENTANYL CITRATE (PF) 100 MCG/2ML IJ SOLN
INTRAMUSCULAR | Status: DC | PRN
  Administered 2020-07-15 (×3): 50 ug via INTRAVENOUS

## 2020-07-15 MED ORDER — ONDANSETRON HCL 4 MG/2ML IJ SOLN: 4.0000 mg | Freq: Four times a day (QID) | INTRAMUSCULAR | Status: DC | PRN

## 2020-07-15 MED ORDER — FAMOTIDINE 20 MG PO TABS
ORAL_TABLET | ORAL | Status: AC
  Filled 2020-07-15: qty 1

## 2020-07-15 MED ORDER — ORAL CARE MOUTH RINSE: 15.0000 mL | Freq: Once | OROMUCOSAL | Status: AC

## 2020-07-15 MED ORDER — FENTANYL CITRATE (PF) 100 MCG/2ML IJ SOLN
25.0000 ug | INTRAMUSCULAR | Status: AC | PRN
  Administered 2020-07-15 (×4): 25 ug via INTRAVENOUS

## 2020-07-15 MED ORDER — ACETAMINOPHEN 10 MG/ML IV SOLN
INTRAVENOUS | Status: AC
  Filled 2020-07-15: qty 100

## 2020-07-15 MED ORDER — FAMOTIDINE 20 MG PO TABS
20.0000 mg | ORAL_TABLET | Freq: Once | ORAL | Status: AC
  Administered 2020-07-15: 20 mg via ORAL

## 2020-07-15 MED ORDER — DEXMEDETOMIDINE (PRECEDEX) IN NS 20 MCG/5ML (4 MCG/ML) IV SYRINGE
PREFILLED_SYRINGE | INTRAVENOUS | Status: DC | PRN
  Administered 2020-07-15: 8 ug via INTRAVENOUS

## 2020-07-15 MED ORDER — METOCLOPRAMIDE HCL 5 MG/ML IJ SOLN: 5.0000 mg | Freq: Three times a day (TID) | INTRAMUSCULAR | Status: DC | PRN

## 2020-07-15 SURGICAL SUPPLY — 37 items
ADAPTER IRRIG TUBE 2 SPIKE SOL (ADAPTER) ×4 IMPLANT
ADPR TBG 2 SPK PMP STRL ASCP (ADAPTER) ×2
APL PRP STRL LF DISP 70% ISPRP (MISCELLANEOUS) ×1
BLADE FULL RADIUS 3.5 (BLADE) IMPLANT
BLADE INCISOR PLUS 4.5 (BLADE) ×2 IMPLANT
BLADE SHAVER 4.5 DBL SERAT CV (CUTTER) ×2 IMPLANT
BLADE SURG SZ11 CARB STEEL (BLADE) ×2 IMPLANT
BRUSH SCRUB EZ  4% CHG (MISCELLANEOUS) ×2
BRUSH SCRUB EZ 4% CHG (MISCELLANEOUS) ×2 IMPLANT
CHLORAPREP W/TINT 26 (MISCELLANEOUS) ×2 IMPLANT
COOLER POLAR GLACIER W/PUMP (MISCELLANEOUS) ×2 IMPLANT
COVER WAND RF STERILE (DRAPES) ×2 IMPLANT
DRAPE SPLIT 6X30 W/TAPE (DRAPES) ×2 IMPLANT
GAUZE SPONGE 4X4 12PLY STRL (GAUZE/BANDAGES/DRESSINGS) ×2 IMPLANT
GAUZE XEROFORM 1X8 LF (GAUZE/BANDAGES/DRESSINGS) ×2 IMPLANT
GLOVE INDICATOR 8.0 STRL GRN (GLOVE) ×2 IMPLANT
GLOVE SURG ORTHO 8.0 STRL STRW (GLOVE) ×2 IMPLANT
GOWN STRL REUS W/ TWL LRG LVL3 (GOWN DISPOSABLE) ×1 IMPLANT
GOWN STRL REUS W/ TWL XL LVL3 (GOWN DISPOSABLE) ×1 IMPLANT
GOWN STRL REUS W/TWL LRG LVL3 (GOWN DISPOSABLE) ×2
GOWN STRL REUS W/TWL XL LVL3 (GOWN DISPOSABLE) ×2
IV LACTATED RINGER IRRG 3000ML (IV SOLUTION) ×8
IV LR IRRIG 3000ML ARTHROMATIC (IV SOLUTION) ×4 IMPLANT
KIT TURNOVER KIT A (KITS) ×2 IMPLANT
MANIFOLD NEPTUNE II (INSTRUMENTS) ×4 IMPLANT
MAT ABSORB  FLUID 56X50 GRAY (MISCELLANEOUS) ×1
MAT ABSORB FLUID 56X50 GRAY (MISCELLANEOUS) ×1 IMPLANT
NEEDLE SPNL 20GX3.5 QUINCKE YW (NEEDLE) ×2 IMPLANT
PACK ARTHROSCOPY KNEE (MISCELLANEOUS) ×2 IMPLANT
PAD ABD DERMACEA PRESS 5X9 (GAUZE/BANDAGES/DRESSINGS) ×4 IMPLANT
PAD WRAPON POLAR KNEE (MISCELLANEOUS) ×1 IMPLANT
SUT ETHILON 4-0 (SUTURE) ×2
SUT ETHILON 4-0 FS2 18XMFL BLK (SUTURE) ×1
SUTURE ETHLN 4-0 FS2 18XMF BLK (SUTURE) ×1 IMPLANT
TUBING ARTHRO INFLOW-ONLY STRL (TUBING) ×2 IMPLANT
WAND WEREWOLF FLOW 90D (MISCELLANEOUS) ×2 IMPLANT
WRAPON POLAR PAD KNEE (MISCELLANEOUS) ×2

## 2020-07-15 NOTE — Transfer of Care (Signed)
Immediate Anesthesia Transfer of Care Note  Patient: Kahla B Rotan  Procedure(s) Performed: Left knee arthroscopy with partial medial or lateral menisectomy, possible chondroplasty, possible partial synovectomy (Left Knee)  Patient Location: PACU  Anesthesia Type:General  Level of Consciousness: awake and patient cooperative  Airway & Oxygen Therapy: Patient Spontanous Breathing and Patient connected to face mask oxygen  Post-op Assessment: Report given to RN and Post -op Vital signs reviewed and stable  Post vital signs: Vital signs stable  Last Vitals:  Vitals Value Taken Time  BP 157/93 07/15/20 1234  Temp    Pulse 69 07/15/20 1235  Resp 20 07/15/20 1235  SpO2 99 % 07/15/20 1235  Vitals shown include unvalidated device data.  Last Pain:  Vitals:   07/15/20 0936  TempSrc: Oral  PainSc: 1          Complications: No complications documented.

## 2020-07-15 NOTE — Anesthesia Postprocedure Evaluation (Signed)
Anesthesia Post Note  Patient: Mary [name]  Procedure(s) Performed: Left knee arthroscopy with partial medial or lateral menisectomy, possible chondroplasty, possible partial synovectomy (Left Knee)  Patient location during evaluation: PACU Anesthesia Type: General Level of consciousness: awake and alert Pain management: pain level controlled Vital Signs Assessment: post-procedure vital signs reviewed and stable Respiratory status: spontaneous breathing, nonlabored ventilation, respiratory function stable and patient connected to nasal cannula oxygen Cardiovascular status: blood pressure returned to baseline and stable Postop Assessment: no apparent nausea or vomiting Anesthetic complications: no   No complications documented.   Last Vitals:  Vitals:   07/15/20 1313 07/15/20 1319  BP:  (!) 161/85  Pulse: 60 63  Resp: 12 11  Temp:    SpO2: 95% 92%    Last Pain:  Vitals:   07/15/20 1319  TempSrc:   PainSc: 5                  Corinda Gubler

## 2020-07-15 NOTE — Discharge Instructions (Signed)
Post Op Home Instructions for Knee Arthroscopy  PATIENT ALREADY HAS FOLLOW-UP AND PHYSICAL THERAPY SCHEDULED  1) Do not sit for longer than 1 hour at a time with your leg dangling down.  You should have your legs elevated (higher than your heart) in a recliner chair or couch.  2) You may be up walking around as tolerated but should take periodic breaks to elevate your legs.  Discontinue use of crutches when you feel you are able to walk without pain or a limp.  3) Work on gentle bending and straightening of the knee.  4) You may remove the Ace wrap and dressings two days after surgery.  Place band aids over the incision sites.  5) You may shower after you remove the surgical dressing.  You do not need to cover the incision with plastic wrap.  The incision can get wet, but do not submerge under water.  After your sutures have been removed, you should wait 24 hours before submerging incision under water.  6) Pain medication can cause constipation.  You should increase your fluid intake, increase your intake of high fiber foods and/or take Metamucil as needed for constipation.  7) Continue your physical therapy exercises, as shown at the office, at least twice daily.  You should set up outpatient physical therapy and start within the first week after surgery.  8) Continue to use your Polar Pack continuously for 2-3 days after surgery.  After you remove the surgical dressing, it is a good idea to use your Polar Pack or ice pack for 30 minutes after doing your exercises to reduce swelling.  9) Do not be surprised if you have increased pain at night.  This usually means you have been a little too active during the day and need to reduce your activities.  10) If you develop lower extremity swelling that does not improve after a night of elevation, please call the office.  This could be an early sign of a blood clot.  Please call with any questions at [phone]     AMBULATORY SURGERY   DISCHARGE INSTRUCTIONS   1) The drugs that you were given will stay in your system until tomorrow so for the next 24 hours you should not:  A) Drive an automobile B) Make any legal decisions C) Drink any alcoholic beverage   2) You may resume regular meals tomorrow.  Today it is better to start with liquids and gradually work up to solid foods.  You may eat anything you prefer, but it is better to start with liquids, then soup and crackers, and gradually work up to solid foods.   3) Please notify your doctor immediately if you have any unusual bleeding, trouble breathing, redness and pain at the surgery site, drainage, fever, or pain not relieved by medication.   4) Additional Instructions:

## 2020-07-15 NOTE — Anesthesia Procedure Notes (Signed)
Procedure Name: Intubation Date/Time: 07/15/2020 11:30 AM Performed by: Omer Jack, CRNA Pre-anesthesia Checklist: Patient identified, Patient being monitored, Timeout performed, Emergency Drugs available and Suction available Patient Re-evaluated:Patient Re-evaluated prior to induction Oxygen Delivery Method: Circle system utilized Preoxygenation: Pre-oxygenation with 100% oxygen Induction Type: IV induction Ventilation: Mask ventilation without difficulty Laryngoscope Size: 3 and McGraph Grade View: Grade I Tube type: Oral Tube size: 7.0 mm Number of attempts: 1 Airway Equipment and Method: Stylet Placement Confirmation: ETT inserted through vocal cords under direct vision,  positive ETCO2 and breath sounds checked- equal and bilateral Secured at: 21.5 cm Tube secured with: Tape Dental Injury: Teeth and Oropharynx as per pre-operative assessment

## 2020-07-15 NOTE — Anesthesia Preprocedure Evaluation (Signed)
Anesthesia Evaluation  Patient identified by MRN, date of birth, ID band Patient awake    Reviewed: Allergy & Precautions, NPO status , Patient's Chart, lab work & pertinent test results  History of Anesthesia Complications (+) AWARENESS UNDER ANESTHESIA and history of anesthetic complications  Airway Mallampati: II  TM Distance: >3 FB Neck ROM: Full    Dental no notable dental hx. (+) Teeth Intact   Pulmonary neg pulmonary ROS, neg sleep apnea, neg COPD, Patient abstained from smoking.Not current smoker, former smoker,  Used to have OSA prior to gastric bypass, but afterwards improved after weight loss. No longer needs CPAp   Pulmonary exam normal breath sounds clear to auscultation       Cardiovascular Exercise Tolerance: Good METShypertension, + Peripheral Vascular Disease  (-) CAD and (-) Past MI (-) dysrhythmias  Rhythm:Regular Rate:Normal - Systolic murmurs    Neuro/Psych PSYCHIATRIC DISORDERS Anxiety negative neurological ROS     GI/Hepatic neg GERD  ,(+)     (-) substance abuse  ,   Endo/Other  neg diabetesHypothyroidism Morbid obesity  Renal/GU negative Renal ROS     Musculoskeletal  (+) Arthritis ,   Abdominal (+) + obese,   Peds  Hematology   Anesthesia Other Findings Past Medical History: No date: Anemia     Comment:  low iron at times No date: Arthritis No date: Colon polyps 99991111: Complication of anesthesia     Comment:  Pt reports she awoke during plantar fascia surgery and               felt "everything" No date: Heart murmur     Comment:  " small per patient"  No date: Hyperlipidemia No date: Hypertension No date: Hypothyroidism No date: Lactose intolerance No date: PAC (premature atrial contraction) No date: Palpitations No date: Panic attacks     Comment:  Pt reports restrictive clothing/areas and dry mouth               cause her to have attack. No date: Peripheral vascular disease  ([place])     Comment:  "small" clot after thermal ablasion 2016: Pneumonia     Comment:  "walking pneumonia" 3 times in 2016 No date: Shortness of breath     Comment:  going upstairs (due to weight). not so much since weight              loss No date: Sleep apnea     Comment:  after weight loss does not have to use cpap No date: Vitamin B12 deficiency No date: Vitamin D deficiency  Reproductive/Obstetrics                             Anesthesia Physical Anesthesia Plan  ASA: III  Anesthesia Plan: General   Post-op Pain Management:    Induction: Intravenous  PONV Risk Score and Plan: 3 and Ondansetron, Dexamethasone and Midazolam  Airway Management Planned: Oral ETT  Additional Equipment: None  Intra-op Plan:   Post-operative Plan: Extubation in OR  Informed Consent: I have reviewed the patients History and Physical, chart, labs and discussed the procedure including the risks, benefits and alternatives for the proposed anesthesia with the patient or authorized representative who has indicated his/her understanding and acceptance.     Dental advisory given  Plan Discussed with: CRNA and Surgeon  Anesthesia Plan Comments: (Discussed risks of anesthesia with patient, including PONV, sore throat, lip/dental damage. Rare risks discussed as well, such as  cardiorespiratory and neurological sequelae. Patient understands.)        Anesthesia Quick Evaluation

## 2020-07-15 NOTE — Op Note (Signed)
PATIENT:  Mary [name]  PRE-OPERATIVE DIAGNOSIS:  S83.207A Unsp tear of unsp meniscus, current injury, left knee, init  POST-OPERATIVE DIAGNOSIS:  Same  PROCEDURE:  Left knee arthroscopy with partial medial AND lateral menisectomy, chondroplasty, partial synovectomy, and loose body removal  SURGEON:  Kurtis Bushman, MD  ANESTHESIA:   General  PREOPERATIVE INDICATIONS:  Mary [name]  60 y.o. female with a diagnosis of S83.207A Unsp tear of unsp meniscus, current injury, left knee, init who failed conservative management and elected for surgical management.    The risks benefits and alternatives were discussed with the patient preoperatively including the risks of infection, bleeding, nerve injury, knee stiffness, persistent pain, osteoarthritis and the need for further surgery. Medical  risks include DVT and pulmonary embolism, myocardial infarction, stroke, pneumonia, respiratory failure and death. The patient understood these risks and wished to proceed.   OPERATIVE FINDINGS: The suprapatellar pouch, medial and lateral gutters were inspected and found to be free of any loose bodies. The undersurface of the patella and landing zone were inspected and grade 4 chondromalacia was noted. There was evidence of lateral subluxation. The medial compartment showed a complex tear of the posterior horn, then anterior horn was intact and stable to probing. A 1 cm loose body was identified in the medial compartment. Grade 3 and 4 chondromalacia was identified in the medial compartment.  The notch was inspected and the ACL and PCL were intact and stable to probing. The lateral compartment was entered and grade 3 degenerative changes were identified. The lateral meniscus had complex tearing along the anterior horn and body. The posterior horn was intact a stable.  OPERATIVE PROCEDURE: Patient was met in the preoperative area. The operative extremity was signed with my initials according the hospital's  correct site of surgery protocol.  The patient was brought to the operating room where they was placed supine on the operative table. General anesthesia was administered. The patient was prepped and draped in a sterile fashion.  A timeout was performed to verify the patient's name, date of birth, medical record number, correct site of surgery correct procedure to be performed. It was also used to verify the patient received antibiotics that all appropriate instruments, and radiographic studies were available in the room. Once all in attendance were in agreement, the case began.  Proposed arthroscopy incisions were drawn out with a surgical marker. These were pre-injected with 0.5% marcaine with epinephrine. An 11 blade was used to establish an inferior lateral and inferomedial portals. The inferomedial portal was created using a 18-gauge spinal needle under direct visualization.  A full diagnostic examination of the knee was performed, please see findings for a complete list of results.  Patient had the meniscal tear treated with a 4-0 resector shaver blade and straight duckbill basket. The meniscus was debrided until a stable rim was achieved. A chondroplasty was performed in all three compartments using the shaver on reverse burr mode. A partial synovectomy was also performed in all three compartments using a 4-0 resector shaver blade and electrocautery. The loose body was removed with a grasper and passed from the field.  The knee was then copiously lavaged. All arthroscopic instruments were removed. The 2 arthroscopy portals were closed with 4-0 nylon. A dry sterile and compressive dressing was applied. The patient was brought to the PACU in stable condition. I was scrubbed and present for the entire case and all sharp and instrument counts were correct at the conclusion the case. I spoke with the patient's  family postoperatively to let them know the case was performed without complication and the  patient was stable in the recovery room.  Kurtis Bushman, MD

## 2020-07-15 NOTE — H&P (Signed)
The patient has been re-examined, and the chart reviewed, and there have been no interval changes to the documented history and physical.  Plan a left knee scope today.  Anesthesia is not consulted regarding a peripheral nerve block for post-operative pain.  The risks, benefits, and alternatives have been discussed at length, and the patient is willing to proceed.     

## 2020-07-25 ENCOUNTER — Other Ambulatory Visit: Payer: Self-pay | Admitting: Emergency Medicine

## 2020-07-25 DIAGNOSIS — E538 Deficiency of other specified B group vitamins: Secondary | ICD-10-CM

## 2020-08-14 ENCOUNTER — Other Ambulatory Visit: Payer: Self-pay | Admitting: Physician Assistant

## 2020-08-14 DIAGNOSIS — G4700 Insomnia, unspecified: Secondary | ICD-10-CM

## 2020-08-17 ENCOUNTER — Telehealth: Payer: Self-pay

## 2020-08-18 ENCOUNTER — Encounter: Payer: Self-pay | Admitting: Adult Medicine

## 2020-08-18 DIAGNOSIS — F410 Panic disorder [episodic paroxysmal anxiety] without agoraphobia: Secondary | ICD-10-CM | POA: Insufficient documentation

## 2020-08-27 NOTE — Progress Notes (Signed)
Scheduled to complete physical 09/02/20 with Dr. Cecil Cobbs.  AMD

## 2020-08-28 ENCOUNTER — Ambulatory Visit: Payer: Self-pay

## 2020-08-28 ENCOUNTER — Other Ambulatory Visit: Payer: Self-pay

## 2020-08-28 DIAGNOSIS — X32XXXA Exposure to sunlight, initial encounter: Secondary | ICD-10-CM | POA: Diagnosis not present

## 2020-08-28 DIAGNOSIS — Z85828 Personal history of other malignant neoplasm of skin: Secondary | ICD-10-CM | POA: Diagnosis not present

## 2020-08-28 DIAGNOSIS — D2262 Melanocytic nevi of left upper limb, including shoulder: Secondary | ICD-10-CM | POA: Diagnosis not present

## 2020-08-28 DIAGNOSIS — L570 Actinic keratosis: Secondary | ICD-10-CM | POA: Diagnosis not present

## 2020-08-28 DIAGNOSIS — L821 Other seborrheic keratosis: Secondary | ICD-10-CM | POA: Diagnosis not present

## 2020-08-28 DIAGNOSIS — D2261 Melanocytic nevi of right upper limb, including shoulder: Secondary | ICD-10-CM | POA: Diagnosis not present

## 2020-08-28 DIAGNOSIS — D2271 Melanocytic nevi of right lower limb, including hip: Secondary | ICD-10-CM | POA: Diagnosis not present

## 2020-08-28 DIAGNOSIS — R69 Illness, unspecified: Secondary | ICD-10-CM | POA: Diagnosis not present

## 2020-08-28 DIAGNOSIS — Z01818 Encounter for other preprocedural examination: Secondary | ICD-10-CM

## 2020-08-28 LAB — POCT URINALYSIS DIPSTICK
Bilirubin, UA: NEGATIVE
Blood, UA: NEGATIVE
Glucose, UA: NEGATIVE
Ketones, UA: NEGATIVE
Leukocytes, UA: NEGATIVE
Nitrite, UA: NEGATIVE
Protein, UA: NEGATIVE
Spec Grav, UA: 1.025 (ref 1.010–1.025)
Urobilinogen, UA: 0.2 E.U./dL
pH, UA: 6 (ref 5.0–8.0)

## 2020-08-29 LAB — CMP12+LP+TP+TSH+6AC+CBC/D/PLT
ALT: 14 IU/L (ref 0–32)
AST: 12 IU/L (ref 0–40)
Albumin/Globulin Ratio: 1.8 (ref 1.2–2.2)
Albumin: 4.1 g/dL (ref 3.8–4.9)
Alkaline Phosphatase: 121 IU/L (ref 44–121)
BUN/Creatinine Ratio: 18 (ref 9–23)
BUN: 12 mg/dL (ref 6–24)
Basophils Absolute: 0.1 10*3/uL (ref 0.0–0.2)
Basos: 1 %
Bilirubin Total: 0.3 mg/dL (ref 0.0–1.2)
Calcium: 9.3 mg/dL (ref 8.7–10.2)
Chloride: 104 mmol/L (ref 96–106)
Chol/HDL Ratio: 3.3 ratio (ref 0.0–4.4)
Cholesterol, Total: 244 mg/dL — ABNORMAL HIGH (ref 100–199)
Creatinine, Ser: 0.68 mg/dL (ref 0.57–1.00)
EOS (ABSOLUTE): 0.1 10*3/uL (ref 0.0–0.4)
Eos: 1 %
Estimated CHD Risk: <0.5 times avg. (ref 0.0–1.0)
Free Thyroxine Index: 3.2 (ref 1.2–4.9)
GFR calc Af Amer: 111 mL/min/{1.73_m2} (ref >59)
GFR calc non Af Amer: 96 mL/min/{1.73_m2} (ref >59)
GGT: 15 IU/L (ref 0–60)
Globulin, Total: 2.3 g/dL (ref 1.5–4.5)
Glucose: 100 mg/dL — ABNORMAL HIGH (ref 65–99)
HDL: 75 mg/dL (ref >39)
Hematocrit: 43 % (ref 34.0–46.6)
Hemoglobin: 14.2 g/dL (ref 11.1–15.9)
Immature Grans (Abs): 0 10*3/uL (ref 0.0–0.1)
Immature Granulocytes: 0 %
Iron: 72 ug/dL (ref 27–159)
LDH: 204 IU/L (ref 119–226)
LDL Chol Calc (NIH): 151 mg/dL — ABNORMAL HIGH (ref 0–99)
Lymphocytes Absolute: 1.8 10*3/uL (ref 0.7–3.1)
Lymphs: 26 %
MCH: 27.8 pg (ref 26.6–33.0)
MCHC: 33 g/dL (ref 31.5–35.7)
MCV: 84 fL (ref 79–97)
Monocytes Absolute: 0.5 10*3/uL (ref 0.1–0.9)
Monocytes: 8 %
Neutrophils Absolute: 4.4 10*3/uL (ref 1.4–7.0)
Neutrophils: 64 %
Phosphorus: 3.1 mg/dL (ref 3.0–4.3)
Platelets: 195 10*3/uL (ref 150–450)
Potassium: 4 mmol/L (ref 3.5–5.2)
RBC: 5.11 x10E6/uL (ref 3.77–5.28)
RDW: 15.1 % (ref 11.7–15.4)
Sodium: 140 mmol/L (ref 134–144)
T3 Uptake Ratio: 29 % (ref 24–39)
T4, Total: 11.2 ug/dL (ref 4.5–12.0)
TSH: 3.45 u[IU]/mL (ref 0.450–4.500)
Total Protein: 6.4 g/dL (ref 6.0–8.5)
Triglycerides: 102 mg/dL (ref 0–149)
Uric Acid: 4.3 mg/dL (ref 3.0–7.2)
VLDL Cholesterol Cal: 18 mg/dL (ref 5–40)
WBC: 6.8 10*3/uL (ref 3.4–10.8)

## 2020-08-29 LAB — VITAMIN B12: Vitamin B-12: >2000 pg/mL — ABNORMAL HIGH (ref 232–1245)

## 2020-08-29 LAB — VITAMIN D 25 HYDROXY (VIT D DEFICIENCY, FRACTURES): Vit D, 25-Hydroxy: 29.5 ng/mL — ABNORMAL LOW (ref 30.0–100.0)

## 2020-09-02 ENCOUNTER — Other Ambulatory Visit: Payer: Self-pay

## 2020-09-02 ENCOUNTER — Ambulatory Visit: Payer: Self-pay | Admitting: Emergency Medicine

## 2020-09-02 VITALS — Temp 98.5°F | Resp 14 | Ht 66.0 in | Wt 283.0 lb

## 2020-09-02 DIAGNOSIS — E538 Deficiency of other specified B group vitamins: Secondary | ICD-10-CM

## 2020-09-02 DIAGNOSIS — R030 Elevated blood-pressure reading, without diagnosis of hypertension: Secondary | ICD-10-CM

## 2020-09-02 NOTE — Progress Notes (Unsigned)
I have reviewed the triage vital signs and the nursing notes.   HISTORY  Chief Complaint No chief complaint on file.  HPI  Mary [name] is a 60 y.o. female is in the clinic for physical.  Patient states that she recently had her left knee arthroscopy and has been working with physical therapy.  She states that she rushed to the clinic today for her physical.  She is aware that her initial blood pressure taken here was elevated and states that her blood pressure diastolically is normally 90 or less.  She states that she had hypertension back 2006 but had gastric surgery and has Her blood pressure under control.  She denies any shortness of breath, dizziness, headache, chest pain and was nonaware that her blood pressure was elevated.       Past Medical History:  Diagnosis Date  . Anemia    low iron at times  . Arthritis   . Colon polyps   . Complication of anesthesia 1991   Pt reports she awoke during plantar fascia surgery and felt "everything"  . Heart murmur    " small per patient"   . Hyperlipidemia   . Hypertension   . Hypothyroidism   . Lactose intolerance   . PAC (premature atrial contraction)   . Palpitations   . Panic attacks    Pt reports restrictive clothing/areas and dry mouth cause her to have attack.  . Peripheral vascular disease ([place])    "small" clot after thermal ablasion  . Pneumonia 2016   "walking pneumonia" 3 times in 2016  . Shortness of breath    going upstairs (due to weight). not so much since weight loss  . Sleep apnea    after weight loss does not have to use cpap  . Vitamin B12 deficiency   . Vitamin D deficiency     Patient Active Problem List   Diagnosis Date Noted  . Panic attacks   . Strain of knee 12/24/2019  . Vitamin B12 deficiency 10/08/2019  . Arthritis 07/16/2019  . Edema 07/16/2019  . History of abdominal pain 07/16/2019  . Low back pain 07/16/2019  . Vitamin D deficiency 07/16/2019  . Thyroid disease 02/28/2019  .  History of gastric bypass 02/28/2019  . Obstructive sleep apnea syndrome 02/28/2019  . Insomnia 02/28/2019  . Class 3 severe obesity due to excess calories with serious comorbidity and body mass index (BMI) of 40.0 to 44.9 in adult [facility]) 02/28/2019  . Elevated BP without diagnosis of hypertension 02/28/2019  . Other hyperlipidemia 02/28/2019  . Hot flashes 02/28/2019  . BMI 40.0-44.9, adult ([place]) 12/16/2015  . Hematoma of lower extremity 11/12/2015  . Essential hypertension 06/20/2014  . Pure hypercholesterolemia 06/20/2014  . S/P lumbar spinal fusion 07/10/2013  . Arthrodesis status 07/10/2013  . Palpitations 01/02/2012  . Edema of both legs 01/02/2012    Past Surgical History:  Procedure Laterality Date  . APPENDECTOMY  1985  . CESAREAN SECTION  1987   twins  . COLONOSCOPY    . COLONOSCOPY WITH PROPOFOL N/A 02/27/2018   Procedure: COLONOSCOPY WITH PROPOFOL;  Surgeon: Toledo, Benay Pike, MD;  Location: [facility] ENDOSCOPY;  Service: Gastroenterology;  Laterality: N/A;  . IRRIGATION AND DEBRIDEMENT HEMATOMA Right 11/12/2015   Procedure: IRRIGATION AND DEBRIDEMENT HEMATOMA;  Surgeon: Robert Bellow, MD;  Location: [facility] ORS;  Service: General;  Laterality: Right;  . KNEE ARTHROSCOPY WITH MEDIAL MENISECTOMY Left 07/15/2020   Procedure: Left knee arthroscopy with partial medial or lateral menisectomy,  possible chondroplasty, possible partial synovectomy;  Surgeon: Lovell Sheehan, MD;  Location: [facility] ORS;  Service: Orthopedics;  Laterality: Left;  . LAMINECTOMY  07/10/2013   L4   L5   DR Ronnald Ramp  . LAPAROSCOPIC SALPINGO OOPHERECTOMY Right 04/17/2014   Procedure: LAPAROSCOPIC SALPINGO OOPHORECTOMY RIGHT;  Surgeon: Everitt Amber, MD;  Location: [facility] ORS;  Service: Gynecology;  Laterality: Right;  . LYSIS OF ADHESION  04/17/2014   Procedure: LYSIS OF ADHESION;  Surgeon: Everitt Amber, MD;  Location: [facility] ORS;  Service: Gynecology;;  . MAXIMUM ACCESS (MAS)POSTERIOR LUMBAR INTERBODY FUSION (PLIF) 1 LEVEL   07/10/2013   Procedure: FOR MAXIMUM ACCESS (MAS) POSTERIOR LUMBAR INTERBODY FUSION (PLIF) LUMBAR FOUR-FIVE;  Surgeon: Eustace Moore, MD;  Location: [facility];  Service: Neurosurgery;;  FOR MAXIMUM ACCESS (MAS) POSTERIOR LUMBAR INTERBODY FUSION (PLIF) LUMBAR FOUR-FIVE  . OOPHORECTOMY     LSO  . OPEN REDUCTION INTERNAL FIXATION (ORIF) DISTAL RADIAL FRACTURE Left 09/20/2018   Procedure: OPEN REDUCTION INTERNAL FIXATION (ORIF) DISTAL RADIAL FRACTURE, LEFT;  Surgeon: Hessie Knows, MD;  Location: [facility] ORS;  Service: Orthopedics;  Laterality: Left;  . PLANTAR FASCIA SURGERY  1999  . 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 VAGINAL HYSTERECTOMY  2001   for bleeding and LSO for cyst    Prior to Admission medications   Medication Sig Start Date End Date Taking? Authorizing Provider  acetaminophen (TYLENOL) 650 MG CR tablet Take 650 mg by mouth every 8 (eight) hours as needed for pain.    [provider]  cyanocobalamin (,VITAMIN B-12,) 1000 MCG/ML injection INJECT 1ML INTRAMUSCULARLY EVERY 14 DAYSFOR 10 DOSES 07/29/20   Cecil Cobbs, MD  EPINEPHrine (EPIPEN 2-PAK) 0.3 mg/0.3 mL IJ SOAJ injection Inject 0.3 mLs (0.3 mg total) into the muscle as needed for anaphylaxis. 02/08/19   Towanda Malkin, MD  furosemide (LASIX) 20 MG tablet Take 0.5 tablets (10 mg total) by mouth daily. Take one half tablet every other day to start. May increase to one daily for swelling Patient not taking: Reported on 07/15/2020 03/05/20   Earleen Newport, MD  HYDROcodone-acetaminophen (NORCO/VICODIN) 5-325 MG tablet Take 1 tablet by mouth every 6 (six) hours as needed for moderate pain. 07/15/20 07/15/21  Lovell Sheehan, MD  imiquimod [place]) 5 % cream Apply topically 3 (three) times a week. 08/28/20   [provider]  levothyroxine (SYNTHROID) 100 MCG tablet Take 1 tablet (100 mcg total) by mouth daily before breakfast. 05/28/20   Ashley Murrain, NP  meclizine (ANTIVERT) 25 MG tablet Take 1 tablet (25 mg  total) by mouth 3 (three) times daily as needed for dizziness or nausea. 01/31/20   Earleen Newport, MD  meloxicam (MOBIC) 15 MG tablet Take 15 mg by mouth daily as needed for pain.    [provider]  methocarbamol (ROBAXIN) 500 MG tablet Take 500 mg by mouth every 6 (six) hours as needed for muscle spasms.    [provider]  Vitamin D, Ergocalciferol, (DRISDOL) 1.25 MG (50000 UNIT) CAPS capsule TAKE ONE CAPSULE BY MOUTH ONCE WEEKLY. Patient taking differently: Take 50,000 Units by mouth every Monday. TAKE ONE CAPSULE BY MOUTH ONCE WEEKLY. 03/04/20   Earleen Newport, MD  zolpidem (AMBIEN CR) 12.5 MG CR tablet TAKE ONE TABLET AT BEDTIME 08/17/20   Sable Feil, PA-C    Allergies Bee venom, Citalopram, Lactose intolerance (gi), and Tape  Family History  Problem Relation Age of Onset  . Heart disease Mother   . Lung  cancer Mother   . Diabetes Mother        later in life  . Coronary artery disease Mother   . Heart disease Father   . Arrhythmia Sister   . Breast cancer Sister 35       diagnosed 09/09/2016  . Colonic polyp Brother        pre cancer    Social History Social History   Tobacco Use  . Smoking status: Former Smoker    Packs/day: 0.30    Years: 4.00    Pack years: 1.20    Quit date: 07/11/2002    Years since quitting: 18.1  . Smokeless tobacco: Never Used  Vaping Use  . Vaping Use: Never used  Substance Use Topics  . Alcohol use: Not Currently    Alcohol/week: 1.0 standard drink    Types: 1 Standard drinks or equivalent per week  . Drug use: No    Review of Systems Constitutional: No fever/chills Eyes: No visual changes. Cardiovascular: Denies chest pain. Respiratory: Denies shortness of breath. Gastrointestinal: No abdominal pain.  No nausea, no vomiting.  No diarrhea.  Genitourinary: Negative for dysuria. Musculoskeletal: Positive left knee pain with recent arthroscopy. Skin: Negative for rash. Neurological: Negative for  headaches, focal weakness or numbness. ____________________________________________   PHYSICAL EXAM: Constitutional: Alert and oriented. Well appearing and in no acute distress. Eyes: Conjunctivae are normal.  Head: Atraumatic. Neck: No stridor. Cardiovascular: Normal rate, regular rhythm. Grossly normal heart sounds.  Good peripheral circulation. Respiratory: Normal respiratory effort.  No retractions. Lungs CTAB. Gastrointestinal: Soft and nontender.  Bowel sounds normal active. Musculoskeletal: Left knee anteriorly with well-healed surgical scars infra patella area.  Minimal soft tissue edema with degenerative changes noted to the left knee.  No erythema or warmth is noted.  No drainage present.  Patient currently walking with a cane for added support. Neurologic:  Normal speech and language. No gross focal neurologic deficits are appreciated.  Skin:  Skin is warm, dry and intact. No rash noted. Psychiatric: Mood and affect are normal. Speech and behavior are normal.  ____________________________________________   LABS (all labs ordered are listed, but only abnormal results are displayed)  Reviewed after patient left due to Epic not working ____________________________________________  EKG  Sinus Bradycardia with Ventricular rate 58 ____________________________________________  PROCEDURES  Procedure(s) performed (including Critical Care):  Procedures   ____________________________________________   INITIAL IMPRESSION / ASSESSMENT AND PLAN / ED COURSE  Elevated B12 level > 2000 Patient will discontinue for 3 weeks, then start back at one injection every 3 weeks for 3 injections and then have labs drawn the week after the injection.  Recheck B12 level   Patient is also to return to have her blood pressure rechecked once a week for 2 to 3 weeks for evaluation of elevated blood pressure in the office today.  It may be necessary to put her on antihypertensives if her blood  pressure continues to remain elevated.  ED Discharge Orders    None      *Please note:  @EDCOVIDEVAL @  Some ED evaluations and interventions may be delayed as a result of limited staffing during and the pandemic.*   Note:  This document was prepared using Dragon voice recognition software and may include unintentional dictation errors.

## 2020-09-03 ENCOUNTER — Encounter: Payer: Self-pay | Admitting: Emergency Medicine

## 2020-09-18 ENCOUNTER — Ambulatory Visit: Payer: Self-pay

## 2020-09-18 ENCOUNTER — Other Ambulatory Visit: Payer: Self-pay

## 2020-09-18 DIAGNOSIS — E538 Deficiency of other specified B group vitamins: Secondary | ICD-10-CM

## 2020-10-16 ENCOUNTER — Other Ambulatory Visit: Payer: Self-pay | Admitting: Physician Assistant

## 2020-10-16 DIAGNOSIS — G4700 Insomnia, unspecified: Secondary | ICD-10-CM

## 2020-11-01 DIAGNOSIS — R059 Cough, unspecified: Secondary | ICD-10-CM | POA: Diagnosis not present

## 2020-11-01 DIAGNOSIS — Z20822 Contact with and (suspected) exposure to covid-19: Secondary | ICD-10-CM | POA: Diagnosis not present

## 2020-11-01 DIAGNOSIS — J209 Acute bronchitis, unspecified: Secondary | ICD-10-CM | POA: Diagnosis not present

## 2020-11-04 ENCOUNTER — Encounter: Payer: Self-pay | Admitting: Nurse Practitioner

## 2020-11-04 ENCOUNTER — Other Ambulatory Visit: Payer: Self-pay

## 2020-11-04 ENCOUNTER — Ambulatory Visit
Admission: RE | Admit: 2020-11-04 | Discharge: 2020-11-04 | Disposition: A | Discharge status: Home / Self Care | Payer: 59 | Source: Ambulatory Visit | Attending: Nurse Practitioner | Admitting: Nurse Practitioner

## 2020-11-04 ENCOUNTER — Ambulatory Visit
Admission: RE | Admit: 2020-11-04 | Discharge: 2020-11-04 | Disposition: A | Discharge status: Home / Self Care | Payer: 59 | Attending: Nurse Practitioner | Admitting: Nurse Practitioner

## 2020-11-04 ENCOUNTER — Ambulatory Visit: Payer: Self-pay | Admitting: Nurse Practitioner

## 2020-11-04 VITALS — BP 143/82 | HR 80 | Temp 97.6°F | Resp 16 | Ht 66.5 in | Wt 281.0 lb

## 2020-11-04 DIAGNOSIS — R5081 Fever presenting with conditions classified elsewhere: Secondary | ICD-10-CM | POA: Insufficient documentation

## 2020-11-04 DIAGNOSIS — R059 Cough, unspecified: Secondary | ICD-10-CM

## 2020-11-04 DIAGNOSIS — J069 Acute upper respiratory infection, unspecified: Secondary | ICD-10-CM

## 2020-11-04 DIAGNOSIS — R0602 Shortness of breath: Secondary | ICD-10-CM | POA: Diagnosis not present

## 2020-11-04 DIAGNOSIS — R079 Chest pain, unspecified: Secondary | ICD-10-CM | POA: Diagnosis not present

## 2020-11-04 DIAGNOSIS — R011 Cardiac murmur, unspecified: Secondary | ICD-10-CM | POA: Diagnosis not present

## 2020-11-04 DIAGNOSIS — R509 Fever, unspecified: Secondary | ICD-10-CM | POA: Diagnosis not present

## 2020-11-04 DIAGNOSIS — M940 Chondrocostal junction syndrome [Tietze]: Secondary | ICD-10-CM

## 2020-11-04 DIAGNOSIS — R6883 Chills (without fever): Secondary | ICD-10-CM | POA: Diagnosis not present

## 2020-11-04 MED ORDER — AZITHROMYCIN 250 MG PO TABS: ORAL_TABLET | ORAL | 0 refills | Status: AC

## 2020-11-04 MED ORDER — ALBUTEROL SULFATE HFA 108 (90 BASE) MCG/ACT IN AERS: 2.0000 | INHALATION_SPRAY | Freq: Four times a day (QID) | RESPIRATORY_TRACT | 0 refills | Status: DC | PRN

## 2020-11-04 NOTE — Progress Notes (Signed)
Called stating S/Sx started last Wednesday & she went to [facility] on Sunday (the one in front of [employer]) Dx'd with Bronchitis Prescriptions:  Augmentin, Prednisone, Tessalon Pearls, Cough Syrup No CXR  Still having productive cough - thick mucus - sometimes brown, yellow, green Last night started having sharp pain in lungs when coughs. Still has wheezing with exhalation  States Walk-in clinic did a Covid test & Influenza test and both were negative.  AMD

## 2020-11-04 NOTE — Progress Notes (Signed)
Subjective:    Patient ID: Mary [name], female    DOB: April 28, 1961, 60 y.o.   MRN: [id number]  HPI  60 year old female presenting to [facility] with acute cough and URI symptoms. [place] the beach [place] and started to feel sick suddenly.   She went to [facility] Sunday 11/01/20 and was given prednisone, benzonatate, Brom-fed, and Augmentin.   She has continued to have a violent cough with pain to right lower anterior chest with a productive cough.   Has had bronchitis in the past, has used inhalers in the past.  Denies sick contacts, has taken 3 negative COVID-19 tests.   Today's Vitals   11/04/20 1050  BP: (!) 143/82  Pulse: 80  Resp: 16  Temp: 97.6 F (36.4 C)  SpO2: 98%  Weight: 281 lb (127.5 kg)  Height: 5' 6.5" (1.689 m)   Body mass index is 44.68 kg/m.  Review of Systems  Constitutional: Positive for fatigue and fever.  HENT: Positive for congestion, rhinorrhea and sinus pressure.   Respiratory: Positive for cough, shortness of breath and wheezing.   Cardiovascular: Negative.   Gastrointestinal: Negative.   Genitourinary: Negative.   Musculoskeletal: Positive for myalgias.  Neurological: Negative.    Past Medical History:  Diagnosis Date  . Anemia    low iron at times  . Arthritis   . Colon polyps   . Complication of anesthesia 1991   Pt reports she awoke during plantar fascia surgery and felt "everything"  . Heart murmur    " small per patient"   . Hyperlipidemia   . Hypertension   . Hypothyroidism   . Lactose intolerance   . PAC (premature atrial contraction)   . Palpitations   . Panic attacks    Pt reports restrictive clothing/areas and dry mouth cause her to have attack.  . Peripheral vascular disease ([place])    "small" clot after thermal ablasion  . Pneumonia 2016   "walking pneumonia" 3 times in 2016  . Shortness of breath    going upstairs (due to weight). not so much since weight loss  . Sleep apnea    after weight loss does not have  to use cpap  . Vitamin B12 deficiency   . Vitamin D deficiency        Objective:   Physical Exam Constitutional:      General: She is not in acute distress.    Appearance: She is ill-appearing.  HENT:     Head: Normocephalic.  Cardiovascular:     Rate and Rhythm: Normal rate and regular rhythm.     Heart sounds: Normal heart sounds.  Pulmonary:     Effort: Tachypnea and accessory muscle usage present. No respiratory distress.     Breath sounds: Examination of the right-upper field reveals wheezing. Examination of the left-upper field reveals wheezing. Examination of the left-lower field reveals decreased breath sounds. Decreased breath sounds and wheezing present. No rhonchi or rales.  Skin:    General: Skin is warm.     Capillary Refill: Capillary refill takes less than 2 seconds.  Neurological:     Mental Status: She is alert.           Assessment & Plan:   Administered Albuterol Nebulizer in clinic. Tolerated well. Patient's work of breathing decreased with improved airway flow after nebulizer treatment. Patient feels she she can take a deeper breath easier.   Will send for Chest XRAY   Advised to use OTC Mucinex  to help with mucous production Increase water intake Rest, but also ambulate at least once an hour to exercise lungs  Take deep breaths often  Albuterol HFA every 4-6 hours as needed Finish antibiotic & steroid course as prescribed.   If no improvement in 48 hours or with any new or worsening symptoms RTC or seek medical attention as needed.  Return to work estimate Monday 11/09/20- work note provided  Meds ordered this encounter  Medications  . azithromycin (ZITHROMAX) 250 MG tablet    Sig: Take 2 tablets on day 1, then 1 tablet daily on days 2 through 5    Dispense:  6 tablet    Refill:  0  . albuterol (VENTOLIN HFA) 108 (90 Base) MCG/ACT inhaler    Sig: Inhale 2 puffs into the lungs every 6 (six) hours as needed for wheezing or shortness of breath.     Dispense:  8 g    Refill:  0

## 2020-11-05 ENCOUNTER — Telehealth: Payer: Self-pay | Admitting: Nurse Practitioner

## 2020-11-05 NOTE — Telephone Encounter (Signed)
Spoke with patient to update on XRAY results.   Instructed to continue antibiotics, inhaler and finish prednisone as ordered.   If no improvement by Monday RTC for follow up  If worse RTC or seek medical attention prior to that.   Narrative & Impression  CLINICAL DATA:  Pt states bronchitis, productive cough, SOB chest pain,fever and chills for 1 week. Pt had negative COVID and flu test on Sunday. History of HTN, former smoker, heart murmur.  EXAM: CHEST - 2 VIEW  COMPARISON:  07/01/2013.  Chest CT, 06/24/2014.  FINDINGS: The cardiac silhouette is normal in size and configuration no mediastinal or hilar masses or evidence of adenopathy. Mild linear opacities noted at the left lateral lung base consistent atelectasis or scarring. Lungs otherwise clear.  No pleural effusion or pneumothorax.  Skeletal structures are intact.  IMPRESSION: No active cardiopulmonary disease.   Electronically Signed   By: Lajean Manes M.D.   On: 11/05/2020 12:39

## 2020-11-12 ENCOUNTER — Other Ambulatory Visit: Payer: Self-pay

## 2020-11-12 DIAGNOSIS — T3695XA Adverse effect of unspecified systemic antibiotic, initial encounter: Secondary | ICD-10-CM

## 2020-11-12 DIAGNOSIS — E079 Disorder of thyroid, unspecified: Secondary | ICD-10-CM

## 2020-11-12 DIAGNOSIS — G4700 Insomnia, unspecified: Secondary | ICD-10-CM

## 2020-11-12 DIAGNOSIS — B379 Candidiasis, unspecified: Secondary | ICD-10-CM

## 2020-11-13 ENCOUNTER — Encounter: Payer: Self-pay | Admitting: Emergency Medicine

## 2020-11-13 ENCOUNTER — Other Ambulatory Visit: Payer: Self-pay

## 2020-11-13 ENCOUNTER — Ambulatory Visit: Payer: Self-pay | Admitting: Emergency Medicine

## 2020-11-13 ENCOUNTER — Ambulatory Visit
Admission: RE | Admit: 2020-11-13 | Discharge: 2020-11-13 | Disposition: A | Discharge status: Home / Self Care | Payer: 59 | Attending: Diagnostic Radiology | Admitting: Diagnostic Radiology

## 2020-11-13 ENCOUNTER — Ambulatory Visit
Admission: RE | Admit: 2020-11-13 | Discharge: 2020-11-13 | Disposition: A | Discharge status: Home / Self Care | Payer: 59 | Source: Ambulatory Visit | Attending: Emergency Medicine | Admitting: Emergency Medicine

## 2020-11-13 VITALS — BP 146/86 | HR 75 | Temp 97.3°F | Resp 16

## 2020-11-13 DIAGNOSIS — R059 Cough, unspecified: Secondary | ICD-10-CM | POA: Diagnosis not present

## 2020-11-13 DIAGNOSIS — R053 Chronic cough: Secondary | ICD-10-CM | POA: Insufficient documentation

## 2020-11-13 DIAGNOSIS — E079 Disorder of thyroid, unspecified: Secondary | ICD-10-CM

## 2020-11-13 DIAGNOSIS — G4700 Insomnia, unspecified: Secondary | ICD-10-CM

## 2020-11-13 LAB — POCT INFLUENZA A/B
Influenza A, POC: NEGATIVE
Influenza B, POC: NEGATIVE

## 2020-11-13 MED ORDER — HYDROCODONE-ACETAMINOPHEN 5-325 MG PO TABS: 1.0000 | ORAL_TABLET | Freq: Four times a day (QID) | ORAL | 0 refills | Status: DC | PRN

## 2020-11-13 MED ORDER — FLUCONAZOLE 150 MG PO TABS: 150.0000 mg | ORAL_TABLET | Freq: Once | ORAL | 0 refills | Status: AC

## 2020-11-13 MED ORDER — ZOLPIDEM TARTRATE ER 12.5 MG PO TBCR: 12.5000 mg | EXTENDED_RELEASE_TABLET | Freq: Every day | ORAL | 2 refills | Status: DC

## 2020-11-13 MED ORDER — LEVOTHYROXINE SODIUM 100 MCG PO TABS: 100.0000 ug | ORAL_TABLET | Freq: Every day | ORAL | 2 refills | Status: DC

## 2020-11-13 NOTE — Progress Notes (Signed)
I have reviewed the triage vital signs and the nursing notes.   HISTORY  Chief Complaint Follow-up (Bronchitis & Viral URI & Costochondritis)  HPI Mary [name] is a 60 y.o. female returns with complaint of persistent cough.  Initially this began 3 weeks ago with fever and productive cough.  Patient was seen at an urgent care where flu and COVID were both negative.  She was placed on Augmentin, prednisone, Tessalon without any improvement.  Patient has continued to have symptoms.  She was seen again on 11/04/2020 at which time her chest x-ray was negative and she was prescribed a Z-Pak which she has completed and is still not feeling better.  She also complains of left-sided chest pain which is worse with cough.  She returns to the clinic today with continued cough.  Last evening she took NyQuil and was at least able to sleep.  She denies any fever, chills, nausea, vomiting, change in taste or smell or body aches.  Patient was a smoker but discontinued over 20 years ago.  Currently she is using her albuterol inhaler 3 times a day.        Past Medical History:  Diagnosis Date  . Anemia    low iron at times  . Arthritis   . Colon polyps   . Complication of anesthesia 1991   Pt reports she awoke during plantar fascia surgery and felt "everything"  . Heart murmur    " small per patient"   . Hyperlipidemia   . Hypertension   . Hypothyroidism   . Lactose intolerance   . PAC (premature atrial contraction)   . Palpitations   . Panic attacks    Pt reports restrictive clothing/areas and dry mouth cause her to have attack.  . Peripheral vascular disease ([place])    "small" clot after thermal ablasion  . Pneumonia 2016   "walking pneumonia" 3 times in 2016  . Shortness of breath    going upstairs (due to weight). not so much since weight loss  . Sleep apnea    after weight loss does not have to use cpap  . Vitamin B12 deficiency   . Vitamin D deficiency     Patient Active Problem  List   Diagnosis Date Noted  . Panic attacks   . Strain of knee 12/24/2019  . Vitamin B12 deficiency 10/08/2019  . Arthritis 07/16/2019  . Edema 07/16/2019  . History of abdominal pain 07/16/2019  . Low back pain 07/16/2019  . Vitamin D deficiency 07/16/2019  . Thyroid disease 02/28/2019  . History of gastric bypass 02/28/2019  . Obstructive sleep apnea syndrome 02/28/2019  . Insomnia 02/28/2019  . Class 3 severe obesity due to excess calories with serious comorbidity and body mass index (BMI) of 40.0 to 44.9 in adult [facility]) 02/28/2019  . Elevated BP without diagnosis of hypertension 02/28/2019  . Other hyperlipidemia 02/28/2019  . Hot flashes 02/28/2019  . BMI 40.0-44.9, adult ([place]) 12/16/2015  . Hematoma of lower extremity 11/12/2015  . Essential hypertension 06/20/2014  . Pure hypercholesterolemia 06/20/2014  . S/P lumbar spinal fusion 07/10/2013  . Arthrodesis status 07/10/2013  . Palpitations 01/02/2012  . Edema of both legs 01/02/2012    Past Surgical History:  Procedure Laterality Date  . APPENDECTOMY  1985  . CESAREAN SECTION  1987   twins  . COLONOSCOPY    . COLONOSCOPY WITH PROPOFOL N/A 02/27/2018   Procedure: COLONOSCOPY WITH PROPOFOL;  Surgeon: Toledo, Benay Pike, MD;  Location: [facility] ENDOSCOPY;  Service: Gastroenterology;  Laterality: N/A;  . IRRIGATION AND DEBRIDEMENT HEMATOMA Right 11/12/2015   Procedure: IRRIGATION AND DEBRIDEMENT HEMATOMA;  Surgeon: Robert Bellow, MD;  Location: [facility] ORS;  Service: General;  Laterality: Right;  . KNEE ARTHROSCOPY WITH MEDIAL MENISECTOMY Left 07/15/2020   Procedure: Left knee arthroscopy with partial medial or lateral menisectomy, possible chondroplasty, possible partial synovectomy;  Surgeon: Lovell Sheehan, MD;  Location: [facility] ORS;  Service: Orthopedics;  Laterality: Left;  . LAMINECTOMY  07/10/2013   L4   L5   DR Ronnald Ramp  . LAPAROSCOPIC SALPINGO OOPHERECTOMY Right 04/17/2014   Procedure: LAPAROSCOPIC SALPINGO OOPHORECTOMY  RIGHT;  Surgeon: Everitt Amber, MD;  Location: [facility] ORS;  Service: Gynecology;  Laterality: Right;  . LYSIS OF ADHESION  04/17/2014   Procedure: LYSIS OF ADHESION;  Surgeon: Everitt Amber, MD;  Location: [facility] ORS;  Service: Gynecology;;  . MAXIMUM ACCESS (MAS)POSTERIOR LUMBAR INTERBODY FUSION (PLIF) 1 LEVEL  07/10/2013   Procedure: FOR MAXIMUM ACCESS (MAS) POSTERIOR LUMBAR INTERBODY FUSION (PLIF) LUMBAR FOUR-FIVE;  Surgeon: Eustace Moore, MD;  Location: [facility];  Service: Neurosurgery;;  FOR MAXIMUM ACCESS (MAS) POSTERIOR LUMBAR INTERBODY FUSION (PLIF) LUMBAR FOUR-FIVE  . OOPHORECTOMY     LSO  . OPEN REDUCTION INTERNAL FIXATION (ORIF) DISTAL RADIAL FRACTURE Left 09/20/2018   Procedure: OPEN REDUCTION INTERNAL FIXATION (ORIF) DISTAL RADIAL FRACTURE, LEFT;  Surgeon: Hessie Knows, MD;  Location: [facility] ORS;  Service: Orthopedics;  Laterality: Left;  . PLANTAR FASCIA SURGERY  1999  . 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 VAGINAL HYSTERECTOMY  2001   for bleeding and LSO for cyst    Prior to Admission medications   Medication Sig Start Date End Date Taking? Authorizing Provider  albuterol (VENTOLIN HFA) 108 (90 Base) MCG/ACT inhaler Inhale 2 puffs into the lungs every 6 (six) hours as needed for wheezing or shortness of breath. 11/04/20  Yes Apolonio Schneiders, FNP  brompheniramine-pseudoephedrine-DM 30-2-10 MG/5ML syrup Take 10 mLs by mouth every 4 (four) hours as needed. 11/01/20  Yes [provider]  cyanocobalamin (,VITAMIN B-12,) 1000 MCG/ML injection INJECT 1ML INTRAMUSCULARLY EVERY 14 DAYSFOR 10 DOSES 07/29/20  Yes Cecil Cobbs, MD  imiquimod (ALDARA) 5 % cream Apply topically 3 (three) times a week. 08/28/20  Yes [provider]  meloxicam (MOBIC) 15 MG tablet Take 15 mg by mouth daily as needed for pain.   Yes [provider]  Vitamin D, Ergocalciferol, (DRISDOL) 1.25 MG (50000 UNIT) CAPS capsule TAKE ONE CAPSULE BY MOUTH ONCE WEEKLY. Patient taking differently: Take 50,000  Units by mouth every Monday. TAKE ONE CAPSULE BY MOUTH ONCE WEEKLY. 03/04/20  Yes Earleen Newport, MD  acetaminophen (TYLENOL) 650 MG CR tablet Take 650 mg by mouth every 8 (eight) hours as needed for pain.    [provider]  amoxicillin-clavulanate (AUGMENTIN) 875-125 MG tablet SMARTSIG:1 Tablet(s) By Mouth Every 12 Hours 11/01/20   [provider]  EPINEPHrine (EPIPEN 2-PAK) 0.3 mg/0.3 mL IJ SOAJ injection Inject 0.3 mLs (0.3 mg total) into the muscle as needed for anaphylaxis. 02/08/19   Towanda Malkin, MD  fluconazole (DIFLUCAN) 150 MG tablet Take 1 tablet (150 mg total) by mouth once for 1 dose. 11/13/20 11/13/20  Johnn Hai, PA-C  HYDROcodone-acetaminophen (NORCO/VICODIN) 5-325 MG tablet Take 1 tablet by mouth every 6 (six) hours as needed for moderate pain. Patient not taking: Reported on 11/13/2020 07/15/20 07/15/21  Lovell Sheehan, MD  levothyroxine (SYNTHROID) 100 MCG tablet Take 1 tablet (100 mcg total) by  mouth daily before breakfast. 11/13/20   Johnn Hai, PA-C  meclizine (ANTIVERT) 25 MG tablet Take 1 tablet (25 mg total) by mouth 3 (three) times daily as needed for dizziness or nausea. Patient not taking: Reported on 11/13/2020 01/31/20   Earleen Newport, MD  methocarbamol (ROBAXIN) 500 MG tablet Take 500 mg by mouth every 6 (six) hours as needed for muscle spasms. Patient not taking: Reported on 11/13/2020    [provider]  predniSONE (DELTASONE) 20 MG tablet Take by mouth. 11/01/20   [provider]  zolpidem (AMBIEN CR) 12.5 MG CR tablet Take 1 tablet (12.5 mg total) by mouth at bedtime. 11/13/20   Johnn Hai, PA-C    Allergies Bee venom, Citalopram, Lactose intolerance (gi), and Tape  Family History  Problem Relation Age of Onset  . Heart disease Mother   . Lung cancer Mother   . Diabetes Mother        later in life  . Coronary artery disease Mother   . Heart disease Father   . Arrhythmia Sister   . Breast cancer  Sister 42       diagnosed 09/09/2016  . Colonic polyp Brother        pre cancer    Social History Social History   Tobacco Use  . Smoking status: Former Smoker    Packs/day: 0.30    Years: 4.00    Pack years: 1.20    Quit date: 07/11/2002    Years since quitting: 18.3  . Smokeless tobacco: Never Used  Vaping Use  . Vaping Use: Never used  Substance Use Topics  . Alcohol use: Not Currently    Alcohol/week: 1.0 standard drink    Types: 1 Standard drinks or equivalent per week  . Drug use: No    Review of Systems Constitutional: No fever/chills Eyes: No visual changes. ENT: No sore throat. Cardiovascular: Denies chest pain. Respiratory: Denies shortness of breath.  Positive for nonproductive cough. Gastrointestinal: No abdominal pain.  No nausea, no vomiting.  No diarrhea.  No constipation. Genitourinary: Negative for dysuria. Musculoskeletal: Positive for left-sided rib pain. Skin: Negative for rash. Neurological: Negative for headaches, focal weakness or numbness.  ____________________________________________   PHYSICAL EXAM:  Constitutional: Alert and oriented. Well appearing and in no acute distress.  Patient is able to speak in complete sentences without any shortness of breath noticeable. Eyes: Conjunctivae are normal. PERRL. EOMI. Head: Atraumatic. Nose: No congestion/rhinnorhea. Neck: No stridor.  Cardiovascular: Normal rate, regular rhythm. Grossly normal heart sounds.  Good peripheral circulation. Respiratory: Normal respiratory effort.  No retractions. Lungs CTAB.  No wheezes are present.  There is point tenderness on palpation of the anterior lateral eighth and ninth rib approximately.  No gross deformities noted. Gastrointestinal: Soft and nontender. No distention.  Musculoskeletal: No lower extremity tenderness nor edema.  No joint effusions. Neurologic:  Normal speech and language. No gross focal neurologic deficits are appreciated. No gait  instability. Skin:  Skin is warm, dry and intact. No rash noted. Psychiatric: Mood and affect are normal. Speech and behavior are normal.  ____________________________________________   LABS (all labs ordered are listed, but only abnormal results are displayed)     FINAL CLINICAL IMPRESSION(S)   Persistent cough  We will repeat COVID and influenza test.  Lab work today and repeat chest x-ray.  Patient is continue using albuterol as needed for shortness of breath or wheezing.  Also prescription for hydrocodone-acetaminophen was sent to the pharmacy as needed for rib  pain.  She is aware that if this becomes worse over the weekend she is to go immediately to the emergency department.   Note:  This document was prepared using Dragon voice recognition software and may include unintentional dictation errors.

## 2020-11-13 NOTE — Progress Notes (Addendum)
Pain on left side radiating around to her back. Cough x2 weeks - going into 3rd week Feels like lung is swollen  Complete meds from walk-in & the meds Apolonio Schneiders, FNP prescribed.  Still using Albuterol Inh tid  Antibiotics caused a yeast infection: Vaginal itching & raw feeling  AMD  [employer] Covid -19 Antigen Rapid Test Lot #:  [id number] Serial #:  05 Exp:  12/22/20  Rapid Covid Test Results = Negative.  AMD

## 2020-11-14 LAB — COMPREHENSIVE METABOLIC PANEL
ALT: 19 IU/L (ref 0–32)
AST: 12 IU/L (ref 0–40)
Albumin/Globulin Ratio: 1.4 (ref 1.2–2.2)
Albumin: 4 g/dL (ref 3.8–4.9)
Alkaline Phosphatase: 120 IU/L (ref 44–121)
BUN/Creatinine Ratio: 16 (ref 9–23)
BUN: 14 mg/dL (ref 6–24)
Bilirubin Total: 0.3 mg/dL (ref 0.0–1.2)
CO2: 22 mmol/L (ref 20–29)
Calcium: 9.5 mg/dL (ref 8.7–10.2)
Chloride: 103 mmol/L (ref 96–106)
Creatinine, Ser: 0.88 mg/dL (ref 0.57–1.00)
Globulin, Total: 2.9 g/dL (ref 1.5–4.5)
Glucose: 90 mg/dL (ref 65–99)
Potassium: 4.6 mmol/L (ref 3.5–5.2)
Sodium: 139 mmol/L (ref 134–144)
Total Protein: 6.9 g/dL (ref 6.0–8.5)
eGFR: 76 mL/min/{1.73_m2} (ref >59)

## 2020-11-14 LAB — CBC WITH DIFFERENTIAL/PLATELET
Basophils Absolute: 0.1 10*3/uL (ref 0.0–0.2)
Basos: 1 %
EOS (ABSOLUTE): 0.1 10*3/uL (ref 0.0–0.4)
Eos: 2 %
Hematocrit: 42.8 % (ref 34.0–46.6)
Hemoglobin: 14.1 g/dL (ref 11.1–15.9)
Immature Grans (Abs): 0.1 10*3/uL (ref 0.0–0.1)
Immature Granulocytes: 1 %
Lymphocytes Absolute: 2.2 10*3/uL (ref 0.7–3.1)
Lymphs: 25 %
MCH: 27.2 pg (ref 26.6–33.0)
MCHC: 32.9 g/dL (ref 31.5–35.7)
MCV: 83 fL (ref 79–97)
Monocytes Absolute: 0.5 10*3/uL (ref 0.1–0.9)
Monocytes: 6 %
Neutrophils Absolute: 5.8 10*3/uL (ref 1.4–7.0)
Neutrophils: 65 %
Platelets: 211 10*3/uL (ref 150–450)
RBC: 5.19 x10E6/uL (ref 3.77–5.28)
RDW: 14.8 % (ref 11.7–15.4)
WBC: 8.7 10*3/uL (ref 3.4–10.8)

## 2020-11-23 DIAGNOSIS — R222 Localized swelling, mass and lump, trunk: Secondary | ICD-10-CM | POA: Diagnosis not present

## 2020-11-23 DIAGNOSIS — R053 Chronic cough: Secondary | ICD-10-CM | POA: Diagnosis not present

## 2020-11-24 ENCOUNTER — Other Ambulatory Visit: Payer: Self-pay | Admitting: Internal Medicine

## 2020-11-24 DIAGNOSIS — R053 Chronic cough: Secondary | ICD-10-CM

## 2020-11-24 DIAGNOSIS — R222 Localized swelling, mass and lump, trunk: Secondary | ICD-10-CM

## 2020-11-27 ENCOUNTER — Ambulatory Visit: Admission: RE | Admit: 2020-11-27 | Payer: 59 | Source: Ambulatory Visit

## 2020-11-30 ENCOUNTER — Other Ambulatory Visit: Payer: Self-pay

## 2020-11-30 ENCOUNTER — Ambulatory Visit
Admission: RE | Admit: 2020-11-30 | Discharge: 2020-11-30 | Disposition: A | Discharge status: Home / Self Care | Payer: 59 | Source: Ambulatory Visit | Attending: Internal Medicine | Admitting: Internal Medicine

## 2020-11-30 DIAGNOSIS — K8020 Calculus of gallbladder without cholecystitis without obstruction: Secondary | ICD-10-CM | POA: Diagnosis not present

## 2020-11-30 DIAGNOSIS — R053 Chronic cough: Secondary | ICD-10-CM

## 2020-11-30 DIAGNOSIS — Z90710 Acquired absence of both cervix and uterus: Secondary | ICD-10-CM | POA: Diagnosis not present

## 2020-11-30 DIAGNOSIS — R222 Localized swelling, mass and lump, trunk: Secondary | ICD-10-CM | POA: Diagnosis not present

## 2020-11-30 DIAGNOSIS — K3189 Other diseases of stomach and duodenum: Secondary | ICD-10-CM | POA: Diagnosis not present

## 2020-11-30 DIAGNOSIS — K6389 Other specified diseases of intestine: Secondary | ICD-10-CM | POA: Diagnosis not present

## 2020-12-04 ENCOUNTER — Ambulatory Visit: Payer: 59

## 2020-12-15 ENCOUNTER — Other Ambulatory Visit: Payer: Self-pay | Admitting: General Surgery

## 2020-12-15 DIAGNOSIS — R935 Abnormal findings on diagnostic imaging of other abdominal regions, including retroperitoneum: Secondary | ICD-10-CM

## 2020-12-15 DIAGNOSIS — R1012 Left upper quadrant pain: Secondary | ICD-10-CM | POA: Diagnosis not present

## 2020-12-17 DIAGNOSIS — X32XXXA Exposure to sunlight, initial encounter: Secondary | ICD-10-CM | POA: Diagnosis not present

## 2020-12-17 DIAGNOSIS — L570 Actinic keratosis: Secondary | ICD-10-CM | POA: Diagnosis not present

## 2020-12-17 DIAGNOSIS — R69 Illness, unspecified: Secondary | ICD-10-CM | POA: Diagnosis not present

## 2020-12-21 ENCOUNTER — Other Ambulatory Visit: Payer: Self-pay

## 2020-12-21 ENCOUNTER — Ambulatory Visit
Admission: RE | Admit: 2020-12-21 | Discharge: 2020-12-21 | Disposition: A | Discharge status: Home / Self Care | Payer: 59 | Source: Ambulatory Visit | Attending: General Surgery | Admitting: General Surgery

## 2020-12-21 DIAGNOSIS — R935 Abnormal findings on diagnostic imaging of other abdominal regions, including retroperitoneum: Secondary | ICD-10-CM

## 2020-12-21 DIAGNOSIS — Z0389 Encounter for observation for other suspected diseases and conditions ruled out: Secondary | ICD-10-CM | POA: Diagnosis not present

## 2021-01-29 ENCOUNTER — Other Ambulatory Visit: Payer: Self-pay | Admitting: Emergency Medicine

## 2021-01-29 DIAGNOSIS — E559 Vitamin D deficiency, unspecified: Secondary | ICD-10-CM

## 2021-02-11 ENCOUNTER — Encounter: Payer: Self-pay | Admitting: Physician Assistant

## 2021-02-11 ENCOUNTER — Other Ambulatory Visit: Payer: Self-pay

## 2021-02-11 ENCOUNTER — Ambulatory Visit: Payer: Self-pay | Admitting: Physician Assistant

## 2021-02-11 VITALS — BP 185/100 | HR 68 | Temp 98.0°F | Resp 14 | Ht 66.5 in | Wt 281.0 lb

## 2021-02-11 DIAGNOSIS — G4700 Insomnia, unspecified: Secondary | ICD-10-CM

## 2021-02-11 DIAGNOSIS — F5101 Primary insomnia: Secondary | ICD-10-CM

## 2021-02-11 MED ORDER — ZOLPIDEM TARTRATE ER 12.5 MG PO TBCR: 12.5000 mg | EXTENDED_RELEASE_TABLET | Freq: Every evening | ORAL | 2 refills | Status: DC | PRN

## 2021-02-11 NOTE — Progress Notes (Signed)
   Subjective: Insomnia    Patient ID: Mary [name], female    DOB: 19-Mar-1961, 60 y.o.   MRN: [id number]  HPI Patient presents to renew prescription for Ambien.  Patient currently taking Ambien 12.5 mg CR.  Advised patient to come in today so we discussed the risks versus benefits of taking Ambien.  Discussed the black box warning of this medication had dosage for female.  Patient states she has been on this medicine greater than 5 years and did not feel at this time in her life that she could consider decreasing dosage.  Patient has multiple stressors and is pending surgery for knee replacement next week.  Review of Systems Elevated blood pressure without diagnosis of hypertension,hyperlipidemia,  hypothyroidism, insomnia, peripheral edema, vitamin B/D deficiency, and history of gastric bypass.    Objective:   Physical Exam Patient is anxious because she thought we were discontinuing her insomnia medicine.  Temperature is 98, pulse 68, respiration 14.  BP is 170/90, patient 96% O2 sat on room air.  Patient weighs 281 pounds and BMI is 44.7. HEENT is unremarkable.  Neck is supple without adenopathy or bruits.  Lungs clear to auscultation.  Heart is regular rate and rhythm. Abdomen distended secondary to body habitus, normoactive bowel sounds, soft, nontender to palpation.       Assessment & Plan:   Again discussion following her dose of Ambien.  Patient was given option for trial of decreased Ambien at 6.25 mg CR but refused.  Patient is not amenable to trying other medications for insomnia.  Patient was understanding of the risks and a handout was given discussing blackbox warning.  Prescriptions refilled.  Also request the patient do a 3-day blood pressure check secondary to elevated systolic readings.  We will follow-up after 3 days.

## 2021-02-11 NOTE — Progress Notes (Signed)
   Subjective:    Patient ID: Mary [name], female    DOB: October 12, 1960, 60 y.o.   MRN: [id number]  HPI    Review of Systems     Objective:   Physical Exam        Assessment & Plan:

## 2021-02-12 ENCOUNTER — Ambulatory Visit: Payer: Self-pay

## 2021-02-12 VITALS — BP 156/86

## 2021-02-12 DIAGNOSIS — R030 Elevated blood-pressure reading, without diagnosis of hypertension: Secondary | ICD-10-CM

## 2021-02-12 NOTE — Progress Notes (Signed)
Late entry: BP check today 02/12/21 around 12:25 pm Bracie will come in Monday and Tues of next week as well for BP checks per order of Randel Pigg, PA-C. Jnai denies concerns this visit.

## 2021-02-15 ENCOUNTER — Other Ambulatory Visit: Payer: Self-pay

## 2021-02-16 ENCOUNTER — Other Ambulatory Visit: Payer: Self-pay

## 2021-02-16 NOTE — Progress Notes (Signed)
Dawnell visited today for 3rd BP check as requested by provider. BP LUE sitting 150/84. Katy reports mild increased stress today related to tying up loose ends before surgery tomorrow for her knee.

## 2021-04-30 ENCOUNTER — Other Ambulatory Visit: Payer: Self-pay

## 2021-04-30 DIAGNOSIS — Z1152 Encounter for screening for COVID-19: Secondary | ICD-10-CM

## 2021-04-30 NOTE — Progress Notes (Signed)
Pt states shes been sick for about a week with minor congestion and sore throat. She's mostly coughing and says it feels like its all sitting in her chest. When she coughs up mucous its green and grey and tastes horrible.Burna Sis

## 2021-05-02 LAB — SARS-COV-2, NAA 2 DAY TAT

## 2021-05-02 LAB — NOVEL CORONAVIRUS, NAA: SARS-CoV-2, NAA: NOT DETECTED

## 2021-05-03 ENCOUNTER — Ambulatory Visit: Payer: 59 | Admitting: Physician Assistant

## 2021-05-03 ENCOUNTER — Other Ambulatory Visit: Payer: Self-pay

## 2021-05-03 ENCOUNTER — Ambulatory Visit
Admission: RE | Admit: 2021-05-03 | Discharge: 2021-05-03 | Disposition: A | Discharge status: Home / Self Care | Payer: 59 | Attending: Physician Assistant | Admitting: Physician Assistant

## 2021-05-03 ENCOUNTER — Encounter: Payer: Self-pay | Admitting: Physician Assistant

## 2021-05-03 ENCOUNTER — Ambulatory Visit
Admission: RE | Admit: 2021-05-03 | Discharge: 2021-05-03 | Disposition: A | Discharge status: Home / Self Care | Payer: 59 | Source: Ambulatory Visit | Attending: Physician Assistant | Admitting: Physician Assistant

## 2021-05-03 VITALS — BP 163/87 | HR 86 | Temp 98.4°F | Resp 20 | Ht 66.5 in

## 2021-05-03 DIAGNOSIS — R051 Acute cough: Secondary | ICD-10-CM

## 2021-05-03 MED ORDER — HYDROCOD POLST-CPM POLST ER 10-8 MG/5ML PO SUER: 5.0000 mL | Freq: Two times a day (BID) | ORAL | 0 refills | Status: DC

## 2021-05-03 NOTE — Progress Notes (Signed)
   Subjective: Productive cough    Patient ID: Mary [name], female    DOB: [birth year], 59 y.o.   MRN: [id number]  HPI Patient complaining of productive cough for 1 week.  Patient states sputum is greenish in nature.  Patient denies dyspnea or chest pain.  Patient was negative for COVID-19 and influenza.  Immunizations are up-to-date.  Patient is status post left knee surgery 3 weeks ago.   Review of Systems Hyperlipidemia, hypertension, hypothyroidism.    Objective:   Physical Exam No acute distress.  Temperature 98.4, pulse 86, respiration 20, BP is 163/87, and patient 97% O2 sat on room air.  HEENT is unremarkable.  HEENT is unremarkable.  Neck is supple without lymphadenopathy or bruits.  Lungs are clear to auscultation.  Heart regular rate and rhythm.      Assessment & Plan:   Further evaluation of chest x-ray is warranted.  Patient given a prescription for Tussionex and discussed the drowsy effect of this medication.  I will follow-up telephonically with patient tomorrow with x-ray results and further treatment plan.

## 2021-05-03 NOTE — Progress Notes (Signed)
Pt tested negative for covid yesterday. Pt has been coughing since 04/26/21. Telehealth prescribed tessalon pearls but have not helped at all. She is prone to getting bronchitis.

## 2021-06-22 ENCOUNTER — Other Ambulatory Visit: Payer: Self-pay

## 2021-06-22 DIAGNOSIS — E538 Deficiency of other specified B group vitamins: Secondary | ICD-10-CM

## 2021-06-22 MED ORDER — CYANOCOBALAMIN 1000 MCG/ML IJ SOLN
1000.0000 ug | INTRAMUSCULAR | 2 refills | Status: DC
Start: 2021-06-22 — End: 2023-08-14

## 2021-07-08 ENCOUNTER — Other Ambulatory Visit: Payer: Self-pay

## 2021-07-08 ENCOUNTER — Other Ambulatory Visit: Payer: Self-pay | Admitting: Physician Assistant

## 2021-07-08 DIAGNOSIS — F5101 Primary insomnia: Secondary | ICD-10-CM

## 2021-07-08 MED ORDER — ZOLPIDEM TARTRATE ER 12.5 MG PO TBCR: 12.5000 mg | EXTENDED_RELEASE_TABLET | Freq: Every evening | ORAL | 0 refills | Status: DC | PRN

## 2021-07-19 ENCOUNTER — Other Ambulatory Visit: Payer: Self-pay

## 2021-07-19 ENCOUNTER — Ambulatory Visit: Payer: Self-pay | Admitting: Physician Assistant

## 2021-07-19 ENCOUNTER — Encounter: Payer: Self-pay | Admitting: Physician Assistant

## 2021-07-19 VITALS — BP 159/83 | HR 70 | Temp 97.5°F | Resp 14

## 2021-07-19 DIAGNOSIS — F5101 Primary insomnia: Secondary | ICD-10-CM

## 2021-07-19 NOTE — Progress Notes (Signed)
° °  Subjective: Insomnia    Patient ID: Mary [name], female    DOB: 24-Jun-1961, 61 y.o.   MRN: [id number]  HPI Patient presents today for reevaluation of insomnia treated with Ambien 12.5 mg CR.  She is here today for reevaluation of titrating the medicine down to 6.25 per FDA recommendations.  Patient state in the past they have tried to titrate down to 6.25 mg CR.  Patient states insomnia not controlled with this doses.  Patient was evaluated in August 2021 by Dr. Jimmye Norman and was decided that 6.25 was ineffective.   Review of Systems Hypothyroidism and insomnia.    Objective:   Physical Exam  No acute distress.  Temp 97.5, pulse 70, respiration 14, BP is 159/83, patient weighs 281 pounds.      Assessment & Plan:   Patient's insomnia is well controlled with Ambien 12.5 CR.  Denies any residual somnolence in the morning.  We will continue her previous doses.

## 2021-08-05 ENCOUNTER — Other Ambulatory Visit: Payer: Self-pay | Admitting: Physician Assistant

## 2021-08-05 DIAGNOSIS — F5101 Primary insomnia: Secondary | ICD-10-CM

## 2021-08-09 ENCOUNTER — Other Ambulatory Visit: Payer: Self-pay

## 2021-08-09 DIAGNOSIS — E079 Disorder of thyroid, unspecified: Secondary | ICD-10-CM

## 2021-08-09 MED ORDER — LEVOTHYROXINE SODIUM 100 MCG PO TABS: 100.0000 ug | ORAL_TABLET | Freq: Every day | ORAL | 3 refills | Status: DC

## 2021-09-09 ENCOUNTER — Ambulatory Visit: Payer: Self-pay

## 2021-09-09 ENCOUNTER — Other Ambulatory Visit: Payer: Self-pay

## 2021-09-09 DIAGNOSIS — Z0000 Encounter for general adult medical examination without abnormal findings: Secondary | ICD-10-CM

## 2021-09-09 DIAGNOSIS — E538 Deficiency of other specified B group vitamins: Secondary | ICD-10-CM

## 2021-09-09 DIAGNOSIS — E559 Vitamin D deficiency, unspecified: Secondary | ICD-10-CM

## 2021-09-09 LAB — POCT URINALYSIS DIPSTICK
Bilirubin, UA: NEGATIVE
Blood, UA: NEGATIVE
Glucose, UA: NEGATIVE
Ketones, UA: NEGATIVE
Leukocytes, UA: NEGATIVE
Nitrite, UA: NEGATIVE
Protein, UA: NEGATIVE
Spec Grav, UA: 1.025 (ref 1.010–1.025)
Urobilinogen, UA: 0.2 E.U./dL
pH, UA: 6 (ref 5.0–8.0)

## 2021-09-09 NOTE — Progress Notes (Signed)
09/16/21 annual physical scheduled. ?

## 2021-09-10 LAB — CMP12+LP+TP+TSH+6AC+CBC/D/PLT
ALT: 12 IU/L (ref 0–32)
AST: 14 IU/L (ref 0–40)
Albumin/Globulin Ratio: 1.9 (ref 1.2–2.2)
Albumin: 4.2 g/dL (ref 3.8–4.9)
Alkaline Phosphatase: 119 IU/L (ref 44–121)
BUN/Creatinine Ratio: 28 (ref 12–28)
BUN: 19 mg/dL (ref 8–27)
Basophils Absolute: 0 10*3/uL (ref 0.0–0.2)
Basos: 1 %
Bilirubin Total: 0.3 mg/dL (ref 0.0–1.2)
Calcium: 9 mg/dL (ref 8.7–10.3)
Chloride: 107 mmol/L — ABNORMAL HIGH (ref 96–106)
Chol/HDL Ratio: 3.4 ratio (ref 0.0–4.4)
Cholesterol, Total: 245 mg/dL — ABNORMAL HIGH (ref 100–199)
Creatinine, Ser: 0.69 mg/dL (ref 0.57–1.00)
EOS (ABSOLUTE): 0.1 10*3/uL (ref 0.0–0.4)
Eos: 1 %
Estimated CHD Risk: <0.5 times avg. (ref 0.0–1.0)
Free Thyroxine Index: 2.3 (ref 1.2–4.9)
GGT: 14 IU/L (ref 0–60)
Globulin, Total: 2.2 g/dL (ref 1.5–4.5)
Glucose: 97 mg/dL (ref 70–99)
HDL: 73 mg/dL (ref >39)
Hematocrit: 39 % (ref 34.0–46.6)
Hemoglobin: 13.3 g/dL (ref 11.1–15.9)
Immature Grans (Abs): 0 10*3/uL (ref 0.0–0.1)
Immature Granulocytes: 0 %
Iron: 70 ug/dL (ref 27–159)
LDH: 179 IU/L (ref 119–226)
LDL Chol Calc (NIH): 155 mg/dL — ABNORMAL HIGH (ref 0–99)
Lymphocytes Absolute: 1.3 10*3/uL (ref 0.7–3.1)
Lymphs: 26 %
MCH: 27.6 pg (ref 26.6–33.0)
MCHC: 34.1 g/dL (ref 31.5–35.7)
MCV: 81 fL (ref 79–97)
Monocytes Absolute: 0.4 10*3/uL (ref 0.1–0.9)
Monocytes: 7 %
Neutrophils Absolute: 3.2 10*3/uL (ref 1.4–7.0)
Neutrophils: 65 %
Phosphorus: 3.1 mg/dL (ref 3.0–4.3)
Platelets: 187 10*3/uL (ref 150–450)
Potassium: 4.2 mmol/L (ref 3.5–5.2)
RBC: 4.82 x10E6/uL (ref 3.77–5.28)
RDW: 14.9 % (ref 11.7–15.4)
Sodium: 141 mmol/L (ref 134–144)
T3 Uptake Ratio: 24 % (ref 24–39)
T4, Total: 9.7 ug/dL (ref 4.5–12.0)
TSH: 3.9 u[IU]/mL (ref 0.450–4.500)
Total Protein: 6.4 g/dL (ref 6.0–8.5)
Triglycerides: 99 mg/dL (ref 0–149)
Uric Acid: 4.3 mg/dL (ref 3.0–7.2)
VLDL Cholesterol Cal: 17 mg/dL (ref 5–40)
WBC: 5 10*3/uL (ref 3.4–10.8)
eGFR: 99 mL/min/{1.73_m2} (ref >59)

## 2021-09-10 LAB — VITAMIN D 25 HYDROXY (VIT D DEFICIENCY, FRACTURES): Vit D, 25-Hydroxy: 34.5 ng/mL (ref 30.0–100.0)

## 2021-09-10 LAB — VITAMIN B12: Vitamin B-12: 460 pg/mL (ref 232–1245)

## 2021-09-16 ENCOUNTER — Ambulatory Visit: Payer: Self-pay | Admitting: Physician Assistant

## 2021-09-16 ENCOUNTER — Encounter: Payer: Self-pay | Admitting: Physician Assistant

## 2021-09-16 ENCOUNTER — Other Ambulatory Visit: Payer: Self-pay

## 2021-09-16 VITALS — BP 157/77 | HR 71 | Temp 97.8°F | Resp 14 | Ht 66.5 in | Wt 283.0 lb

## 2021-09-16 DIAGNOSIS — R030 Elevated blood-pressure reading, without diagnosis of hypertension: Secondary | ICD-10-CM

## 2021-09-16 DIAGNOSIS — R609 Edema, unspecified: Secondary | ICD-10-CM

## 2021-09-16 DIAGNOSIS — Z0000 Encounter for general adult medical examination without abnormal findings: Secondary | ICD-10-CM

## 2021-09-16 MED ORDER — HYDROCHLOROTHIAZIDE 25 MG PO TABS: 25.0000 mg | ORAL_TABLET | Freq: Every day | ORAL | 0 refills | Status: DC

## 2021-09-16 NOTE — Progress Notes (Signed)
Pt concerned of lower legs swellings. ?

## 2021-09-16 NOTE — Progress Notes (Signed)
[facility]   ____________________________________________   None    (approximate)  I have reviewed the triage vital signs and the nursing notes.   HISTORY  Chief Complaint Annual Exam   HPI Mary [name] is a 61 y.o. female patient presents annual physical exam.  Patient is most concerned for chronic bilateral lower extremity edema.  Patient had left knee surgery 7 months ago.  Scheduled for follow-up next month with orthopedics.  Patient stated before her knee surgery she used to wear compression stockings.  Patient states after her knee surgery she found that wearing compressions stockings increase that her left knee edema.  Patient has not discussed this with her orthopedics.  It was noted today to the patient blood pressure was elevated with no history of hypertension.         Past Medical History:  Diagnosis Date   Anemia    low iron at times   Arthritis    Colon polyps    Complication of anesthesia 1991   Pt reports she awoke during plantar fascia surgery and felt "everything"   Heart murmur    " small per patient"    Hyperlipidemia    Hypertension    Hypothyroidism    Lactose intolerance    PAC (premature atrial contraction)    Palpitations    Panic attacks    Pt reports restrictive clothing/areas and dry mouth cause her to have attack.   Peripheral vascular disease ([place])    "small" clot after thermal ablasion   Pneumonia 2016   "walking pneumonia" 3 times in 2016   Shortness of breath    going upstairs (due to weight). not so much since weight loss   Sleep apnea    after weight loss does not have to use cpap   Vitamin B12 deficiency    Vitamin D deficiency     Patient Active Problem List   Diagnosis Date Noted   Panic attacks    Strain of knee 12/24/2019   Vitamin B12 deficiency 10/08/2019   Arthritis 07/16/2019   Edema 07/16/2019   History of abdominal pain 07/16/2019   Low back pain 07/16/2019   Vitamin D deficiency  07/16/2019   Thyroid disease 02/28/2019   History of gastric bypass 02/28/2019   Obstructive sleep apnea syndrome 02/28/2019   Insomnia 02/28/2019   Class 3 severe obesity due to excess calories with serious comorbidity and body mass index (BMI) of 40.0 to 44.9 in adult ([place]) 02/28/2019   Elevated BP without diagnosis of hypertension 02/28/2019   Other hyperlipidemia 02/28/2019   Hot flashes 02/28/2019   BMI 40.0-44.9, adult ([place]) 12/16/2015   Hematoma of lower extremity 11/12/2015   Essential hypertension 06/20/2014   Pure hypercholesterolemia 06/20/2014   S/P lumbar spinal fusion 07/10/2013   Arthrodesis status 07/10/2013   Palpitations 01/02/2012   Edema of both legs 01/02/2012    Past Surgical History:  Procedure Laterality Date   [place]   twins   COLONOSCOPY     COLONOSCOPY WITH PROPOFOL N/A 02/27/2018   Procedure: COLONOSCOPY WITH PROPOFOL;  Surgeon: Toledo, Benay Pike, MD;  Location: [facility];  Service: Gastroenterology;  Laterality: N/A;   IRRIGATION AND DEBRIDEMENT HEMATOMA Right 11/12/2015   Procedure: IRRIGATION AND DEBRIDEMENT HEMATOMA;  Surgeon: Robert Bellow, MD;  Location: [facility];  Service: General;  Laterality: Right;   KNEE ARTHROSCOPY WITH MEDIAL MENISECTOMY Left 07/15/2020   Procedure: Left knee arthroscopy with partial medial  or lateral menisectomy, possible chondroplasty, possible partial synovectomy;  Surgeon: Lovell Sheehan, MD;  Location: [facility];  Service: Orthopedics;  Laterality: Left;   LAMINECTOMY  07/10/2013   L4   L5   DR Ronnald Ramp   LAPAROSCOPIC SALPINGO OOPHERECTOMY Right 04/17/2014   Procedure: LAPAROSCOPIC SALPINGO OOPHORECTOMY RIGHT;  Surgeon: Everitt Amber, MD;  Location: [facility];  Service: Gynecology;  Laterality: Right;   LYSIS OF ADHESION  04/17/2014   Procedure: LYSIS OF ADHESION;  Surgeon: Everitt Amber, MD;  Location: [facility];  Service: Gynecology;;   MAXIMUM ACCESS (MAS)POSTERIOR LUMBAR INTERBODY FUSION  (PLIF) 1 LEVEL  07/10/2013   Procedure: FOR MAXIMUM ACCESS (MAS) POSTERIOR LUMBAR INTERBODY FUSION (PLIF) LUMBAR FOUR-FIVE;  Surgeon: Eustace Moore, MD;  Location: [facility];  Service: Neurosurgery;;  FOR MAXIMUM ACCESS (MAS) POSTERIOR LUMBAR INTERBODY FUSION (PLIF) LUMBAR FOUR-FIVE   OOPHORECTOMY     LSO   OPEN REDUCTION INTERNAL FIXATION (ORIF) DISTAL RADIAL FRACTURE Left 09/20/2018   Procedure: OPEN REDUCTION INTERNAL FIXATION (ORIF) DISTAL RADIAL FRACTURE, LEFT;  Surgeon: Hessie Knows, MD;  Location: [facility];  Service: Orthopedics;  Laterality: Left;   PLANTAR FASCIA SURGERY  1999   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   VAGINAL HYSTERECTOMY  2001   for bleeding and LSO for cyst    Prior to Admission medications   Medication Sig Start Date End Date Taking? Authorizing Provider  azelastine (ASTELIN) 0.1 % nasal spray    Yes [provider]  cyanocobalamin (,VITAMIN B-12,) 1000 MCG/ML injection Inject 1 mL (1,000 mcg total) into the muscle every 14 (fourteen) days. 06/22/21  Yes Sable Feil, PA-C  EPINEPHrine (EPIPEN 2-PAK) 0.3 mg/0.3 mL IJ SOAJ injection Inject 0.3 mLs (0.3 mg total) into the muscle as needed for anaphylaxis. 02/08/19  Yes Towanda Malkin, MD  levothyroxine (SYNTHROID) 100 MCG tablet Take 1 tablet (100 mcg total) by mouth daily before breakfast. 08/09/21  Yes Sable Feil, PA-C  meclizine (ANTIVERT) 25 MG tablet Take 1 tablet (25 mg total) by mouth 3 (three) times daily as needed for dizziness or nausea. 01/31/20  Yes Earleen Newport, MD  meloxicam (MOBIC) 15 MG tablet Take 15 mg by mouth daily as needed for pain.   Yes [provider]  Vitamin D, Ergocalciferol, (DRISDOL) 1.25 MG (50000 UNIT) CAPS capsule TAKE 1 CAPSULE BY MOUTH ONE TIME PER WEEK 02/01/21  Yes Sable Feil, PA-C  zolpidem (AMBIEN CR) 12.5 MG CR tablet Take 1 tablet (12.5 mg total) by mouth at bedtime as needed for sleep. 08/05/21  Yes Sable Feil, PA-C  albuterol  (VENTOLIN HFA) 108 (90 Base) MCG/ACT inhaler Inhale 2 puffs into the lungs every 6 (six) hours as needed for wheezing or shortness of breath. Patient not taking: Reported on 09/16/2021 11/04/20   Apolonio Schneiders, FNP  PULMICORT FLEXHALER 90 MCG/ACT inhaler Inhale into the lungs. Patient not taking: Reported on 07/19/2021 03/09/21   [provider]    Allergies Bee venom, Citalopram, Lactose intolerance (gi), and Tape  Family History  Problem Relation Age of Onset   Heart disease Mother    Lung cancer Mother    Diabetes Mother        later in life   Coronary artery disease Mother    Heart disease Father    Arrhythmia Sister    Breast cancer Sister 50       diagnosed 09/09/2016   Colonic polyp Brother        pre cancer  Social History Social History   Tobacco Use   Smoking status: Former    Packs/day: 0.30    Years: 4.00    Pack years: 1.20    Types: Cigarettes    Quit date: 07/11/2002    Years since quitting: 19.1   Smokeless tobacco: Never  Vaping Use   Vaping Use: Never used  Substance Use Topics   Alcohol use: Not Currently    Alcohol/week: 1.0 standard drink    Types: 1 Standard drinks or equivalent per week   Drug use: No    Review of Systems Constitutional: No fever/chills Eyes: No visual changes. ENT: No sore throat. Cardiovascular: Denies chest pain. Respiratory: Denies shortness of breath. Gastrointestinal: No abdominal pain.  No nausea, no vomiting.  No diarrhea.  No constipation. Genitourinary: Negative for dysuria. Musculoskeletal: Negative for back pain. Skin: Negative for rash. Neurological: Negative for headaches, focal weakness or numbness. Psychiatric: Anxiety and insomnia. Endocrine: Hyperlipidemia, hypertension, and hypothyroidism. Allergic/Immunilogical: Bee venom, Citalopram, lactose intolerance, and tape.  ____________________________________________   PHYSICAL EXAM:  VITAL SIGNS: Temperature is 97.8, pulse 71, respiration 14, BP is  157/77, and patient 97% O2 sat on room air.  Patient weighs 283 pounds and BMI 44.99. Constitutional: Alert and oriented. Well appearing and in no acute distress. Eyes: Conjunctivae are normal. PERRL. EOMI. Head: Atraumatic. Nose: No congestion/rhinnorhea. Mouth/Throat: Mucous membranes are moist.  Oropharynx non-erythematous. Neck: No stridor.  No cervical spine tenderness to palpation. Hematological/Lymphatic/Immunilogical: No cervical lymphadenopathy. Cardiovascular: Normal rate, regular rhythm. Grossly normal heart sounds.  Good peripheral circulation. Respiratory: Normal respiratory effort.  No retractions. Lungs CTAB. Gastrointestinal: Soft and nontender. No distention. No abdominal bruits. No CVA tenderness. Genitourinary: Deferred Musculoskeletal: Bilateral peripheral edema.   Neurologic:  Normal speech and language. No gross focal neurologic deficits are appreciated. No gait instability. Skin:  Skin is warm, dry and intact. No rash noted. Psychiatric: Mood and affect are normal. Speech and behavior are normal.  ____________________________________________   LABS       Component Ref Range & Units 7 d ago (09/09/21) 1 yr ago (08/28/20) 1 yr ago (10/01/19)  Color, UA  Amber  yellow  Light Yellow   Clarity, UA  Clear  clear  Clear   Glucose, UA Negative Negative  Negative  Negative   Bilirubin, UA  Negative  negative  Negative   Ketones, UA  Negative  negative  Negative   Spec Grav, UA 1.010 - 1.025 1.025  1.025  1.010   Blood, UA  Negative  negative  Negative   pH, UA 5.0 - 8.0 6.0  6.0  6.0   Protein, UA Negative Negative  Negative  Negative   Urobilinogen, UA 0.2 or 1.0 E.U./dL 0.2  0.2  0.2   Nitrite, UA  Negative  negative  Negative   Leukocytes, UA Negative Negative  Negative  Negative   Appearance   medium     Odor               Specimen Collected: 09/09/21 09:08 Last Resulted: 09/09/21 09:08      Lab Flowsheet    Order Details    View Encounter    Lab and  Collection Details    Routing    Result History    View Encounter Conversation        Result Care Coordination   Patient Communication   Add Comments   Seen Back to Top       Other Results from 09/09/2021   Contains abnormal  data CMP12+LP+TP+TSH+6AC+CBC/D/Plt Order: [id number] Status: Final result    Visible to patient: Yes (seen)    Next appt: 09/30/2021 at 11:15 AM in [facility] Sable Feil, PA-C)    Dx: Routine adult health maintenance    0 Result Notes           Component Ref Range & Units 7 d ago (09/09/21) 10 mo ago (11/13/20) 10 mo ago (11/13/20) 1 yr ago (08/28/20) 1 yr ago (07/13/20) 1 yr ago (07/13/20) 1 yr ago (03/23/20)  Glucose 70 - 99 mg/dL 97  90 R   100 High  R  89 CM     Uric Acid 3.0 - 7.2 mg/dL 4.3    4.3 CM      Comment:            Therapeutic target for gout patients: <6.0  BUN 8 - 27 mg/dL 19  14 R   12 R  14 R     Creatinine, Ser 0.57 - 1.00 mg/dL 0.69  0.88   0.68  0.72 R     eGFR >59 mL/min/1.73 99  76        BUN/Creatinine Ratio 12 - $Re'28 28  16 'RAg$ R   18 R      Sodium 134 - 144 mmol/L 141  139   140  140 R     Potassium 3.5 - 5.2 mmol/L 4.2  4.6   4.0  4.1 R     Chloride 96 - 106 mmol/L 107 High   103   104  108 R     Calcium 8.7 - 10.3 mg/dL 9.0  9.5 R   9.3 R  9.2 R     Phosphorus 3.0 - 4.3 mg/dL 3.1    3.1      Total Protein 6.0 - 8.5 g/dL 6.4  6.9   6.4      Albumin 3.8 - 4.9 g/dL 4.2  4.0   4.1      Globulin, Total 1.5 - 4.5 g/dL 2.2  2.9   2.3      Albumin/Globulin Ratio 1.2 - 2.2 1.9  1.4   1.8      Bilirubin Total 0.0 - 1.2 mg/dL 0.3  0.3   0.3      Alkaline Phosphatase 44 - 121 IU/L 119  120   121      LDH 119 - 226 IU/L 179    204      AST 0 - 40 IU/L $Remov'14  12   12      'GMwMQE$ ALT 0 - 32 IU/L $Remov'12  19   14      'hrBmHQ$ GGT 0 - 60 IU/L 14    15      Iron 27 - 159 ug/dL 70    72    72   Cholesterol, Total 100 - 199 mg/dL 245 High     244 High       Triglycerides 0 - 149 mg/dL 99    102      HDL >39 mg/dL 73    75      VLDL Cholesterol Cal 5 - 40 mg/dL 17     18      LDL Chol Calc (NIH) 0 - 99 mg/dL 155 High     151 High       Chol/HDL Ratio 0.0 - 4.4 ratio 3.4    3.3 CM      Comment:  T. Chol/HDL Ratio                                              Men  Women                                1/2 Avg.Risk  3.4    3.3                                    Avg.Risk  5.0    4.4                                 2X Avg.Risk  9.6    7.1                                 3X Avg.Risk 23.4   11.0   Estimated CHD Risk 0.0 - 1.0 times avg.  < 0.5     < 0.5 CM      Comment: The CHD Risk is based on the T. Chol/HDL ratio. Other  factors affect CHD Risk such as hypertension, smoking,  diabetes, severe obesity, and family history of  premature CHD.   TSH 0.450 - 4.500 uIU/mL 3.900    3.450      T4, Total 4.5 - 12.0 ug/dL 9.7    11.2      T3 Uptake Ratio 24 - 39 % 24    29      Free Thyroxine Index 1.2 - 4.9 2.3    3.2      WBC 3.4 - 10.8 x10E3/uL 5.0   8.7  6.8   7.0 R    RBC 3.77 - 5.28 x10E6/uL 4.82   5.19  5.11   4.71 R    Hemoglobin 11.1 - 15.9 g/dL 13.3   14.1  14.2   13.2 R    Hematocrit 34.0 - 46.6 % 39.0   42.8  43.0   40.9 R    MCV 79 - 97 fL 81   83  84   86.8 R    MCH 26.6 - 33.0 pg 27.6   27.2  27.8   28.0 R    MCHC 31.5 - 35.7 g/dL 34.1   32.9  33.0   32.3 R    RDW 11.7 - 15.4 % 14.9   14.8  15.1   15.2 R    Platelets 150 - 450 x10E3/uL 187   211  195   192 R    Neutrophils Not Estab. % 65   65  64      Lymphs Not Estab. % $Remove'26   25  26      'tsvDoDJ$ Monocytes Not Estab. % $Remove'7   6  8      'Hmeadyi$ Eos Not Estab. % $Remove'1   2  1      'KnVDwru$ Basos Not Estab. % $Remove'1   1  1      'DHwJRkd$ Neutrophils Absolute 1.4 - 7.0 x10E3/uL 3.2   5.8  4.4      Lymphocytes Absolute 0.7 - 3.1 x10E3/uL 1.3   2.2  1.8  Monocytes Absolute 0.1 - 0.9 x10E3/uL 0.4   0.5  0.5      EOS (ABSOLUTE) 0.0 - 0.4 x10E3/uL 0.1   0.1  0.1      Basophils Absolute 0.0 - 0.2 x10E3/uL 0.0   0.1  0.1      Immature Granulocytes Not Estab. % 0   1  0      Immature Grans (Abs) 0.0 - 0.1 x10E3/uL  0.0   0.1  0.0      Resulting Agency  [facility]       Narrative Performed by: Maryan Puls Performed at:  [facility]  [address], [place], [place]  [id number]  Lab Director: Rush Farmer MD, Phone:  [phone]    Specimen Collected: 09/09/21 08:33 Last Resulted: 09/10/21 08:14      Lab Flowsheet    Order Details    View Encounter    Lab and Collection Details    Routing    Result History    View Encounter Conversation      CM=Additional comments  R=Reference range differs from displayed range      Result Care Coordination   Patient Communication   Add Comments   Seen Back to Top         VITAMIN D 25 Hydroxy (Vit-D Deficiency, Fractures) Order: [id number] Status: Final result    Visible to patient: Yes (seen)    Next appt: 09/30/2021 at 11:15 AM in [facility] Sable Feil, PA-C)    Dx: Vitamin D deficiency    0 Result Notes        Component Ref Range & Units 7 d ago (09/09/21) 1 yr ago (08/28/20) 1 yr ago (10/01/19) 2 yr ago (12/21/18)  Vit D, 25-Hydroxy 30.0 - 100.0 ng/mL 34.5  29.5 Low  CM  33.7 CM  42.1 CM   Comment: Vitamin D deficiency has been defined by the Institute of  Medicine and an Endocrine Society practice guideline as a  level of serum 25-OH vitamin D less than 20 ng/mL (1,2).  The Endocrine Society went on to further define vitamin D  insufficiency as a level between 21 and 29 ng/mL (2).  1. IOM (Institute of Medicine). 2010. Dietary reference     intakes for calcium and D. [place]: The     [employer].  2. Holick MF, Binkley [place], Bischoff-Ferrari HA, et al.     Evaluation, treatment, and prevention of vitamin D     deficiency: an Endocrine Society clinical practice     guideline. JCEM. 2011 Jul; 96(7):1911-30.   Resulting Agency  [facility]       Narrative Performed by: Maryan Puls Performed at:  [facility]  [address] [place], [place]  [id number]  Lab Director: Rush Farmer MD, Phone:  [phone]    Specimen Collected: 09/09/21 08:33 Last Resulted: 09/10/21 08:14      Lab Flowsheet    Order Details    View Encounter    Lab and Collection Details    Routing    Result History    View Encounter Conversation      CM=Additional comments      Result Care Coordination   Patient Communication   Add Comments   Seen Back to Top         Vitamin B12 Order: [id number] Status: Final result    Visible to patient: Yes (seen)    Next  appt: 09/30/2021 at 11:15 AM in [facility] Sable Feil, PA-C)    Dx: Routine adult health maintenance; Vit...    0 Result Notes        Component Ref Range & Units 7 d ago (09/09/21) 1 yr ago (08/28/20) 1 yr ago (10/01/19) 2 yr ago (12/21/18)            ____________________________________________  EKG Sinus  Rhythm at 60 bpm. WITHIN NORMAL LIMITS  ____________________________________________    ____________________________________________   INITIAL IMPRESSION / ASSESSMENT AND PLAN  As part of my medical decision making, I reviewed the following data within the [facility]      Discussed lab results and EKG findings with patient.  Patient will continue lifestyle modification to lower her cholesterol.    Patient will take daily r measurements of her bilateral lower edema status post starting HCTZ 25 mg daily.  We will get a 3-day blood pressure check after her follow-up in 2 weeks.     ____________________________________________   FINAL CLINICAL IMPRESSION Well exam.    ED Discharge Orders     None        Note:  This document was prepared using Dragon voice recognition software and may include unintentional dictation errors.

## 2021-09-30 ENCOUNTER — Encounter: Payer: Self-pay | Admitting: Physician Assistant

## 2021-09-30 ENCOUNTER — Other Ambulatory Visit: Payer: Self-pay

## 2021-09-30 ENCOUNTER — Ambulatory Visit: Payer: Self-pay | Admitting: Physician Assistant

## 2021-09-30 VITALS — BP 133/77 | HR 90 | Temp 97.6°F | Resp 14 | Ht 66.5 in | Wt 280.0 lb

## 2021-09-30 DIAGNOSIS — I872 Venous insufficiency (chronic) (peripheral): Secondary | ICD-10-CM

## 2021-09-30 NOTE — Progress Notes (Signed)
Pt presents today for follow up on swelling in left knee and ankle. Pt has got measurements of the swelling/ ?

## 2021-09-30 NOTE — Progress Notes (Signed)
? ?[facility] e ? ? ?____________________________________________ ? ? None  ?  (approximate) ? ?I have reviewed the triage vital signs and the nursing notes. ? ? ?HISTORY ? ?Chief Complaint ?2 Week Follow-up ? ? ?HPI ?Mary [name] is a 61 y.o. female patient presents with 2 weeks follow-up of peripheral edema.  Patient was started on HCTZ at 25 mg and states from her measurements no noticeable reduction in the edema of the lower extremity.  Patient did notice an reduction of her blood pressure from 157/77 to 133/77.  Patient requires evaluation by vascular clinic but desires to continue taking HCTZ at 25 mg. ?   ? ?  ? ? ?Past Medical History:  ?Diagnosis Date  ? Anemia   ? low iron at times  ? Arthritis   ? Colon polyps   ? Complication of anesthesia 1991  ? Pt reports she awoke during plantar fascia surgery and felt "everything"  ? Heart murmur   ? " small per patient"   ? Hyperlipidemia   ? Hypertension   ? Hypothyroidism   ? Lactose intolerance   ? PAC (premature atrial contraction)   ? Palpitations   ? Panic attacks   ? Pt reports restrictive clothing/areas and dry mouth cause her to have attack.  ? Peripheral vascular disease ([place])   ? "small" clot after thermal ablasion  ? Pneumonia 2016  ? "walking pneumonia" 3 times in 2016  ? Shortness of breath   ? going upstairs (due to weight). not so much since weight loss  ? Sleep apnea   ? after weight loss does not have to use cpap  ? Vitamin B12 deficiency   ? Vitamin D deficiency   ? ? ?Patient Active Problem List  ? Diagnosis Date Noted  ? Panic attacks   ? Strain of knee 12/24/2019  ? Vitamin B12 deficiency 10/08/2019  ? Arthritis 07/16/2019  ? Edema 07/16/2019  ? History of abdominal pain 07/16/2019  ? Low back pain 07/16/2019  ? Vitamin D deficiency 07/16/2019  ? Thyroid disease 02/28/2019  ? History of gastric bypass 02/28/2019  ? Obstructive sleep apnea syndrome 02/28/2019  ? Insomnia 02/28/2019  ? Class 3 severe obesity due to excess  calories with serious comorbidity and body mass index (BMI) of 40.0 to 44.9 in adult [facility]) 02/28/2019  ? Elevated BP without diagnosis of hypertension 02/28/2019  ? Other hyperlipidemia 02/28/2019  ? Hot flashes 02/28/2019  ? BMI 40.0-44.9, adult ([place]) 12/16/2015  ? Hematoma of lower extremity 11/12/2015  ? Essential hypertension 06/20/2014  ? Pure hypercholesterolemia 06/20/2014  ? S/P lumbar spinal fusion 07/10/2013  ? Arthrodesis status 07/10/2013  ? Palpitations 01/02/2012  ? Edema of both legs 01/02/2012  ? ? ?Past Surgical History:  ?Procedure Laterality Date  ? APPENDECTOMY  1985  ? CESAREAN SECTION  1987  ? twins  ? COLONOSCOPY    ? COLONOSCOPY WITH PROPOFOL N/A 02/27/2018  ? Procedure: COLONOSCOPY WITH PROPOFOL;  Surgeon: Toledo, Benay Pike, MD;  Location: [facility];  Service: Gastroenterology;  Laterality: N/A;  ? IRRIGATION AND DEBRIDEMENT HEMATOMA Right 11/12/2015  ? Procedure: IRRIGATION AND DEBRIDEMENT HEMATOMA;  Surgeon: Robert Bellow, MD;  Location: [facility] ORS;  Service: General;  Laterality: Right;  ? KNEE ARTHROSCOPY WITH MEDIAL MENISECTOMY Left 07/15/2020  ? Procedure: Left knee arthroscopy with partial medial or lateral menisectomy, possible chondroplasty, possible partial synovectomy;  Surgeon: Lovell Sheehan, MD;  Location: [facility] ORS;  Service: Orthopedics;  Laterality: Left;  ?  LAMINECTOMY  07/10/2013  ? L4   L5   DR Ronnald Ramp  ? LAPAROSCOPIC SALPINGO OOPHERECTOMY Right 04/17/2014  ? Procedure: LAPAROSCOPIC SALPINGO OOPHORECTOMY RIGHT;  Surgeon: Everitt Amber, MD;  Location: [facility];  Service: Gynecology;  Laterality: Right;  ? LYSIS OF ADHESION  04/17/2014  ? Procedure: LYSIS OF ADHESION;  Surgeon: Everitt Amber, MD;  Location: [facility];  Service: Gynecology;;  ? MAXIMUM ACCESS (MAS)POSTERIOR LUMBAR INTERBODY FUSION (PLIF) 1 LEVEL  07/10/2013  ? Procedure: FOR MAXIMUM ACCESS (MAS) POSTERIOR LUMBAR INTERBODY FUSION (PLIF) LUMBAR FOUR-FIVE;  Surgeon: Eustace Moore, MD;  [facility];  Service:  Neurosurgery;;  FOR MAXIMUM ACCESS (MAS) POSTERIOR LUMBAR INTERBODY FUSION (PLIF) LUMBAR FOUR-FIVE  ? OOPHORECTOMY    ? LSO  ? OPEN REDUCTION INTERNAL FIXATION (ORIF) DISTAL RADIAL FRACTURE Left 09/20/2018  ? Procedure: OPEN REDUCTION INTERNAL FIXATION (ORIF) DISTAL RADIAL FRACTURE, LEFT;  Surgeon: Hessie Knows, MD;  Location: [facility] ORS;  Service: Orthopedics;  Laterality: Left;  ? [place]  ? 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  ? VAGINAL HYSTERECTOMY  2001  ? for bleeding and LSO for cyst  ? ? ?Prior to Admission medications   ?Medication Sig Start Date End Date Taking? Authorizing Provider  ?azelastine (ASTELIN) 0.1 % nasal spray    Yes [provider]  ?cyanocobalamin (,VITAMIN B-12,) 1000 MCG/ML injection Inject 1 mL (1,000 mcg total) into the muscle every 14 (fourteen) days. 06/22/21  Yes Sable Feil, PA-C  ?EPINEPHrine (EPIPEN 2-PAK) 0.3 mg/0.3 mL IJ SOAJ injection Inject 0.3 mLs (0.3 mg total) into the muscle as needed for anaphylaxis. 02/08/19  Yes Towanda Malkin, MD  ?hydrochlorothiazide (HYDRODIURIL) 25 MG tablet Take 1 tablet (25 mg total) by mouth daily. 09/16/21  Yes Sable Feil, PA-C  ?levothyroxine (SYNTHROID) 100 MCG tablet Take 1 tablet (100 mcg total) by mouth daily before breakfast. 08/09/21  Yes Sable Feil, PA-C  ?meclizine (ANTIVERT) 25 MG tablet Take 1 tablet (25 mg total) by mouth 3 (three) times daily as needed for dizziness or nausea. 01/31/20  Yes Earleen Newport, MD  ?meloxicam (MOBIC) 15 MG tablet Take 15 mg by mouth daily as needed for pain.   Yes [provider]  ?Doug Sou 90 MCG/ACT inhaler Inhale into the lungs. 03/09/21  Yes [provider]  ?Vitamin D, Ergocalciferol, (DRISDOL) 1.25 MG (50000 UNIT) CAPS capsule TAKE 1 CAPSULE BY MOUTH ONE TIME PER WEEK 02/01/21  Yes Sable Feil, PA-C  ?zolpidem (AMBIEN CR) 12.5 MG CR tablet Take 1 tablet (12.5 mg total) by mouth at bedtime as needed for sleep. 08/05/21   Yes Sable Feil, PA-C  ? ? ?Allergies ?Bee venom, Citalopram, Lactose intolerance (gi), and Tape ? ?Family History  ?Problem Relation Age of Onset  ? Heart disease Mother   ? Lung cancer Mother   ? Diabetes Mother   ?     later in life  ? Coronary artery disease Mother   ? Heart disease Father   ? Arrhythmia Sister   ? Breast cancer Sister 6  ?     diagnosed 09/09/2016  ? Colonic polyp Brother   ?     pre cancer  ? ? ?Social History ?Social History  ? ?Tobacco Use  ? Smoking status: Former  ?  Packs/day: 0.30  ?  Years: 4.00  ?  Pack years: 1.20  ?  Types: Cigarettes  ?  Quit date: 07/11/2002  ?  Years since quitting: 19.2  ?  Smokeless tobacco: Never  ?Vaping Use  ? Vaping Use: Never used  ?Substance Use Topics  ? Alcohol use: Not Currently  ?  Alcohol/week: 1.0 standard drink  ?  Types: 1 Standard drinks or equivalent per week  ? Drug use: No  ? ? ?Review of Systems ? ?Constitutional: No fever/chills ?Eyes: No visual changes. ?ENT: No sore throat. ?Cardiovascular: Denies chest pain. ?Respiratory: Denies shortness of breath. ?Gastrointestinal: No abdominal pain.  No nausea, no vomiting.  No diarrhea.  No constipation. ?Genitourinary: Negative for dysuria. ?Musculoskeletal: Negative for back pain. ?Skin: Negative for rash. ?Neurological: Negative for headaches, focal weakness or numbness. ?Psychiatric: Anxiety ?Endocrine: Hyperlipidemia, hypertension, hypothyroidism. ?Hematological/Lymphatic: Anemia ?Allergic/Immunilogical: Bee venom, Citalopram, Lactose intolerance (gi), and Tape ?____________________________________________ ? ? ?PHYSICAL EXAM: ? ?VITAL SIGNS: Temperature 97.6, pulse 90, respiration 14, BP is 133/77, and patient is 96 percent O2 sat on room air.  Patient weighs 280 pounds and BMI 44.52. ?Constitutional: Alert and oriented. Well appearing and in no acute distress. ?Eyes: Conjunctivae are normal. PERRL. EOMI. ?Head: Atraumatic. ?Nose: No congestion/rhinnorhea. ?Mouth/Throat: Mucous membranes are  moist.  Oropharynx non-erythematous. ?Neck: No stridor.  No cervical spine tenderness to palpation. ?Hematological/Lymphatic/Immunilogical: No cervical lymphadenopathy. ?Cardiovascular: Normal rate, regular rhythm.

## 2021-10-13 ENCOUNTER — Other Ambulatory Visit: Payer: Self-pay | Admitting: Physician Assistant

## 2021-10-13 DIAGNOSIS — I10 Essential (primary) hypertension: Secondary | ICD-10-CM

## 2021-10-14 ENCOUNTER — Other Ambulatory Visit: Payer: Self-pay

## 2021-10-14 DIAGNOSIS — F5101 Primary insomnia: Secondary | ICD-10-CM

## 2021-11-08 ENCOUNTER — Other Ambulatory Visit: Payer: Self-pay | Admitting: Physician Assistant

## 2021-11-08 DIAGNOSIS — F5101 Primary insomnia: Secondary | ICD-10-CM

## 2021-12-13 ENCOUNTER — Encounter (INDEPENDENT_AMBULATORY_CARE_PROVIDER_SITE_OTHER): Payer: Self-pay | Admitting: Vascular Surgery

## 2021-12-13 ENCOUNTER — Ambulatory Visit (INDEPENDENT_AMBULATORY_CARE_PROVIDER_SITE_OTHER): Payer: 59 | Admitting: Vascular Surgery

## 2021-12-13 VITALS — BP 175/94 | HR 76 | Resp 17 | Ht 66.5 in | Wt 289.0 lb

## 2021-12-13 DIAGNOSIS — I890 Lymphedema, not elsewhere classified: Secondary | ICD-10-CM | POA: Diagnosis not present

## 2021-12-13 DIAGNOSIS — I10 Essential (primary) hypertension: Secondary | ICD-10-CM | POA: Diagnosis not present

## 2021-12-13 DIAGNOSIS — E7849 Other hyperlipidemia: Secondary | ICD-10-CM | POA: Diagnosis not present

## 2021-12-13 DIAGNOSIS — G4733 Obstructive sleep apnea (adult) (pediatric): Secondary | ICD-10-CM

## 2021-12-13 NOTE — Progress Notes (Signed)
MRN : [id number]  Mary [name] is a 61 y.o. ([birth year]) female who presents with chief complaint of legs swell.  History of Present Illness:  Patient is seen for evaluation of leg swelling. The patient first noticed the swelling remotely but is now concerned because of a significant increase in the overall edema. The swelling isn't associated with significant pain.  There has been an increasing amount of  discoloration noted by the patient. The patient notes that in the morning the legs are improved but they steadily worsened throughout the course of the day. Elevation seems to make the swelling of the legs better, dependency makes them much worse.   There is no history of ulcerations associated with the swelling.   The patient denies any recent changes in their medications.  The patient has not been wearing graduated compression.  The patient has no had any past angiography, interventions or vascular surgery.  The patient denies a history of DVT or PE. There is no prior history of phlebitis. There is no history of primary lymphedema.  There is no history of radiation treatment to the groin or pelvis No history of malignancies. No history of trauma or groin or pelvic surgery. No history of foreign travel or parasitic infections area    No outpatient medications have been marked as taking for the 12/13/21 encounter (Appointment) with Delana Meyer, Dolores Lory, MD.    Past Medical History:  Diagnosis Date   Anemia    low iron at times   Arthritis    Colon polyps    Complication of anesthesia 1991   Pt reports she awoke during plantar fascia surgery and felt "everything"   Heart murmur    " small per patient"    Hyperlipidemia    Hypertension    Hypothyroidism    Lactose intolerance    PAC (premature atrial contraction)    Palpitations    Panic attacks    Pt reports restrictive clothing/areas and dry mouth cause her to have attack.   Peripheral vascular disease ([place])     "small" clot after thermal ablasion   Pneumonia 2016   "walking pneumonia" 3 times in 2016   Shortness of breath    going upstairs (due to weight). not so much since weight loss   Sleep apnea    after weight loss does not have to use cpap   Vitamin B12 deficiency    Vitamin D deficiency     Past Surgical History:  Procedure Laterality Date   [place]   twins   COLONOSCOPY     COLONOSCOPY WITH PROPOFOL N/A 02/27/2018   Procedure: COLONOSCOPY WITH PROPOFOL;  Surgeon: Toledo, Benay Pike, MD;  Location: [facility];  Service: Gastroenterology;  Laterality: N/A;   IRRIGATION AND DEBRIDEMENT HEMATOMA Right 11/12/2015   Procedure: IRRIGATION AND DEBRIDEMENT HEMATOMA;  Surgeon: Robert Bellow, MD;  Location: [facility];  Service: General;  Laterality: Right;   KNEE ARTHROSCOPY WITH MEDIAL MENISECTOMY Left 07/15/2020   Procedure: Left knee arthroscopy with partial medial or lateral menisectomy, possible chondroplasty, possible partial synovectomy;  Surgeon: Lovell Sheehan, MD;  Location: [facility];  Service: Orthopedics;  Laterality: Left;   LAMINECTOMY  07/10/2013   L4   L5   DR Ronnald Ramp   LAPAROSCOPIC SALPINGO OOPHERECTOMY Right 04/17/2014   Procedure: LAPAROSCOPIC SALPINGO OOPHORECTOMY RIGHT;  Surgeon: Everitt Amber, MD;  [facility];  Service: Gynecology;  Laterality: Right;   LYSIS OF  ADHESION  04/17/2014   Procedure: LYSIS OF ADHESION;  Surgeon: Everitt Amber, MD;  [facility];  Service: Gynecology;;   MAXIMUM ACCESS (MAS)POSTERIOR LUMBAR INTERBODY FUSION (PLIF) 1 LEVEL  07/10/2013   Procedure: FOR MAXIMUM ACCESS (MAS) POSTERIOR LUMBAR INTERBODY FUSION (PLIF) LUMBAR FOUR-FIVE;  Surgeon: Eustace Moore, MD;  [facility];  Service: Neurosurgery;;  FOR MAXIMUM ACCESS (MAS) POSTERIOR LUMBAR INTERBODY FUSION (PLIF) LUMBAR FOUR-FIVE   OOPHORECTOMY     LSO   OPEN REDUCTION INTERNAL FIXATION (ORIF) DISTAL RADIAL FRACTURE Left 09/20/2018   Procedure:  OPEN REDUCTION INTERNAL FIXATION (ORIF) DISTAL RADIAL FRACTURE, LEFT;  Surgeon: Hessie Knows, MD;  Location: [facility];  Service: Orthopedics;  Laterality: Left;   [place]   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   VAGINAL HYSTERECTOMY  2001   for bleeding and LSO for cyst    Social History Social History   Tobacco Use   Smoking status: Former    Packs/day: 0.30    Years: 4.00    Pack years: 1.20    Types: Cigarettes    Quit date: 07/11/2002    Years since quitting: 19.4   Smokeless tobacco: Never  Vaping Use   Vaping Use: Never used  Substance Use Topics   Alcohol use: Not Currently    Alcohol/week: 1.0 standard drink    Types: 1 Standard drinks or equivalent per week   Drug use: No    Family History Family History  Problem Relation Age of Onset   Heart disease Mother    Lung cancer Mother    Diabetes Mother        later in life   Coronary artery disease Mother    Heart disease Father    Arrhythmia Sister    Breast cancer Sister 60       diagnosed 09/09/2016   Colonic polyp Brother        pre cancer    Allergies  Allergen Reactions   Bee Venom Anaphylaxis   Citalopram Nausea Only   Lactose Intolerance (Gi) Diarrhea    Bloating, upset stomach   Tape Other (See Comments)    Thin skin, rips off skin     REVIEW OF SYSTEMS (Negative unless checked)  Constitutional: '[]'$ Weight loss  '[]'$ Fever  '[]'$ Chills Cardiac: '[]'$ Chest pain   '[]'$ Chest pressure   '[]'$ Palpitations   '[]'$ Shortness of breath when laying flat   '[]'$ Shortness of breath with exertion. Vascular:  '[]'$ Pain in legs with walking   '[x]'$ Pain in legs with standing  '[]'$ History of DVT   '[]'$ Phlebitis   '[x]'$ Swelling in legs   '[]'$ Varicose veins   '[]'$ Non-healing ulcers Pulmonary:   '[]'$ Uses home oxygen   '[]'$ Productive cough   '[]'$ Hemoptysis   '[]'$ Wheeze  '[]'$ COPD   '[]'$ Asthma Neurologic:  '[]'$ Dizziness   '[]'$ Seizures   '[]'$ History of stroke   '[]'$ History of TIA  '[]'$ Aphasia   '[]'$ Vissual changes   '[]'$ Weakness or numbness in arm   '[]'$ Weakness or  numbness in leg Musculoskeletal:   '[]'$ Joint swelling   '[]'$ Joint pain   '[]'$ Low back pain Hematologic:  '[]'$ Easy bruising  '[]'$ Easy bleeding   '[]'$ Hypercoagulable state   '[]'$ Anemic Gastrointestinal:  '[]'$ Diarrhea   '[]'$ Vomiting  '[]'$ Gastroesophageal reflux/heartburn   '[]'$ Difficulty swallowing. Genitourinary:  '[]'$ Chronic kidney disease   '[]'$ Difficult urination  '[]'$ Frequent urination   '[]'$ Blood in urine Skin:  '[]'$ Rashes   '[]'$ Ulcers  Psychological:  '[]'$ History of anxiety   '[]'$  History of major depression.  Physical Examination  There were no vitals filed for this visit. There  is no height or weight on file to calculate BMI. Gen: WD/WN, NAD Head: [place]/AT, No temporalis wasting.  Ear/Nose/Throat: Hearing grossly intact, nares w/o erythema or drainage, pinna without lesions Eyes: PER, EOMI, sclera nonicteric.  Neck: Supple, no gross masses.  No JVD.  Pulmonary:  Good air movement, no audible wheezing, no use of accessory muscles.  Cardiac: RRR, precordium not hyperdynamic. Vascular:  scattered varicosities present bilaterally.  Mild venous stasis changes to the legs bilaterally.  3-4+ soft pitting edema  Vessel Right Left  Radial Palpable Palpable  Gastrointestinal: soft, non-distended. No guarding/no peritoneal signs.  Musculoskeletal: M/S 5/5 throughout.  No deformity.  Neurologic: CN 2-12 intact. Pain and light touch intact in extremities.  Symmetrical.  Speech is fluent. Motor exam as listed above. Psychiatric: Judgment intact, Mood & affect appropriate for pt's clinical situation. Dermatologic: Venous rashes no ulcers noted.  No changes consistent with cellulitis. Lymph : No lichenification or skin changes of chronic lymphedema.  CBC Lab Results  Component Value Date   WBC 5.0 09/09/2021   HGB 13.3 09/09/2021   HCT 39.0 09/09/2021   MCV 81 09/09/2021   PLT 187 09/09/2021    BMET    Component Value Date/Time   NA 141 09/09/2021 0833   NA 138 11/09/2013 1156   K 4.2 09/09/2021 0833   K 3.9 11/09/2013  1156   CL 107 (H) 09/09/2021 0833   CL 106 11/09/2013 1156   CO2 22 11/13/2020 1156   CO2 26 11/09/2013 1156   GLUCOSE 97 09/09/2021 0833   GLUCOSE 89 07/13/2020 1115   GLUCOSE 100 (H) 11/09/2013 1156   BUN 19 09/09/2021 0833   BUN 16 11/09/2013 1156   CREATININE 0.69 09/09/2021 0833   CREATININE 0.65 11/09/2013 1156   CALCIUM 9.0 09/09/2021 0833   CALCIUM 8.8 11/09/2013 1156   GFRNONAA 96 08/28/2020 0945   GFRNONAA >60 07/13/2020 1115   GFRNONAA >60 11/09/2013 1156   GFRAA 111 08/28/2020 0945   GFRAA >60 11/09/2013 1156   CrCl cannot be calculated (Patient's most recent lab result is older than the maximum 21 days allowed.).  COAG Lab Results  Component Value Date   INR 0.97 07/01/2013    Radiology No results found.   Assessment/Plan 1. Lymphedema Recommend:  No surgery or intervention at this point in time.    I have reviewed my discussion with the patient regarding lymphedema and why it  causes symptoms.  Patient will continue wearing graduated compression on a daily basis. The patient should put the compression on first thing in the morning and removing them in the evening. The patient should not sleep in the compression.   In addition, behavioral modification throughout the day will be continued.  This will include frequent elevation (such as in a recliner), use of over the counter pain medications as needed and exercise such as walking.  The systemic causes for chronic edema such as liver, kidney and cardiac etiologies do not appear to have significant changed over the past year.    Despite conservative treatments including graduated compression therapy class 1 and behavioral modification including exercise and elevation the patient  has not obtained adequate control of the lymphedema.  The patient still has stage 3 lymphedema and therefore, I believe that a lymph pump should be added to improve the control of the patient's lymphedema.  Additionally, a lymph pump is  warranted because it will reduce the risk of cellulitis and ulceration in the future.  Patient should follow-up in six  months    2. Essential hypertension Continue antihypertensive medications as already ordered, these medications have been reviewed and there are no changes at this time.   3. Obstructive sleep apnea syndrome Continue pulmonary medications and aerosols as already ordered, these medications have been reviewed and there are no changes at this time.  The association of sleep apnea and leg edema was discussed in detail.  4. Other hyperlipidemia Continue statin as ordered and reviewed, no changes at this time     Hortencia Pilar, MD  12/13/2021 11:06 AM

## 2021-12-19 ENCOUNTER — Encounter (INDEPENDENT_AMBULATORY_CARE_PROVIDER_SITE_OTHER): Payer: Self-pay | Admitting: Vascular Surgery

## 2021-12-22 ENCOUNTER — Other Ambulatory Visit (INDEPENDENT_AMBULATORY_CARE_PROVIDER_SITE_OTHER): Payer: Self-pay | Admitting: Vascular Surgery

## 2021-12-22 DIAGNOSIS — R609 Edema, unspecified: Secondary | ICD-10-CM

## 2021-12-23 ENCOUNTER — Encounter (INDEPENDENT_AMBULATORY_CARE_PROVIDER_SITE_OTHER): Payer: Self-pay | Admitting: Vascular Surgery

## 2021-12-23 ENCOUNTER — Ambulatory Visit (INDEPENDENT_AMBULATORY_CARE_PROVIDER_SITE_OTHER): Payer: 59

## 2021-12-23 ENCOUNTER — Ambulatory Visit (INDEPENDENT_AMBULATORY_CARE_PROVIDER_SITE_OTHER): Payer: 59 | Admitting: Vascular Surgery

## 2021-12-23 VITALS — BP 176/102 | HR 64 | Resp 16 | Wt 288.0 lb

## 2021-12-23 DIAGNOSIS — E7800 Pure hypercholesterolemia, unspecified: Secondary | ICD-10-CM

## 2021-12-23 DIAGNOSIS — R609 Edema, unspecified: Secondary | ICD-10-CM

## 2021-12-23 DIAGNOSIS — I890 Lymphedema, not elsewhere classified: Secondary | ICD-10-CM

## 2021-12-23 DIAGNOSIS — I872 Venous insufficiency (chronic) (peripheral): Secondary | ICD-10-CM | POA: Diagnosis not present

## 2021-12-23 DIAGNOSIS — I10 Essential (primary) hypertension: Secondary | ICD-10-CM | POA: Diagnosis not present

## 2021-12-23 NOTE — Progress Notes (Signed)
MRN : [id number]  Mary [name] is a 61 y.o. (06-24-1961) female who presents with chief complaint of legs swell.  History of Present Illness:   The patient returns to the office for followup evaluation regarding leg swelling.  The swelling has persisted and the pain associated with swelling continues. There have not been any interval development of a ulcerations or wounds.  Since the previous visit the patient has been wearing graduated compression stockings and has noted little if any improvement in the lymphedema. The patient has been using compression routinely morning until night.  The patient also states elevation during the day and exercise is being done too.   Duplex ultrasound of the venous system was obtained.  It shows reflux within the femoral vein bilaterally.  There is also reflux at the saphenofemoral junction bilaterally.  The superficial reflux appears to be predominantly extending into the anterior lateral saphenous vein as opposed to the saphenous vein proper bilaterally.  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   EPINEPHrine (EPIPEN 2-PAK) 0.3 mg/0.3 mL IJ SOAJ injection Inject 0.3 mLs (0.3 mg total) into the muscle as needed for anaphylaxis.   hydrochlorothiazide (HYDRODIURIL) 25 MG tablet Take 1 tablet (25 mg total) by mouth daily.   levothyroxine (SYNTHROID) 100 MCG tablet Take 1 tablet (100 mcg total) by mouth daily before breakfast.   meclizine (ANTIVERT) 25 MG tablet Take 1 tablet (25 mg total) by mouth 3 (three) times daily as needed for dizziness or nausea.   meloxicam (MOBIC) 15 MG tablet Take 15 mg by mouth daily as needed for pain.   methocarbamol (ROBAXIN) 500 MG tablet Take 500 mg by mouth 2 (two) times daily as needed.   Vitamin D, Ergocalciferol, (DRISDOL)  1.25 MG (50000 UNIT) CAPS capsule TAKE 1 CAPSULE BY MOUTH ONE TIME PER WEEK   zolpidem (AMBIEN CR) 12.5 MG CR tablet Take 1 tablet (12.5 mg total) by mouth at bedtime as needed for sleep.    Past Medical History:  Diagnosis Date   Anemia    low iron at times   Arthritis    Colon polyps    Complication of anesthesia 1991   Pt reports she awoke during plantar fascia surgery and felt "everything"   Heart murmur    " small per patient"    Hyperlipidemia    Hypertension    Hypothyroidism    Lactose intolerance    PAC (premature atrial contraction)    Palpitations    Panic attacks    Pt reports restrictive clothing/areas and dry mouth cause her to have attack.   Peripheral vascular disease ([place])    "small" clot after thermal ablasion   Pneumonia 2016   "walking pneumonia" 3 times in 2016   Shortness of breath    going upstairs (due to weight). not so much since weight loss   Sleep apnea    after weight loss does not have to use cpap   Vitamin B12 deficiency    Vitamin D deficiency     Past Surgical History:  Procedure Laterality Date   [place]   twins   COLONOSCOPY     COLONOSCOPY WITH PROPOFOL N/A 02/27/2018   Procedure: COLONOSCOPY WITH PROPOFOL;  Surgeon: Alice Reichert, Benay Pike, MD;  Location:  [facility];  Service: Gastroenterology;  Laterality: N/A;   IRRIGATION AND DEBRIDEMENT HEMATOMA Right 11/12/2015   Procedure: IRRIGATION AND DEBRIDEMENT HEMATOMA;  Surgeon: Robert Bellow, MD;  Location: [facility];  Service: General;  Laterality: Right;   KNEE ARTHROSCOPY WITH MEDIAL MENISECTOMY Left 07/15/2020   Procedure: Left knee arthroscopy with partial medial or lateral menisectomy, possible chondroplasty, possible partial synovectomy;  Surgeon: Lovell Sheehan, MD;  Location: [facility];  Service: Orthopedics;  Laterality: Left;   LAMINECTOMY  07/10/2013   L4   L5   DR Ronnald Ramp   LAPAROSCOPIC SALPINGO OOPHERECTOMY Right 04/17/2014   Procedure:  LAPAROSCOPIC SALPINGO OOPHORECTOMY RIGHT;  Surgeon: Everitt Amber, MD;  Location: [facility];  Service: Gynecology;  Laterality: Right;   LYSIS OF ADHESION  04/17/2014   Procedure: LYSIS OF ADHESION;  Surgeon: Everitt Amber, MD;  Location: [facility];  Service: Gynecology;;   MAXIMUM ACCESS (MAS)POSTERIOR LUMBAR INTERBODY FUSION (PLIF) 1 LEVEL  07/10/2013   Procedure: FOR MAXIMUM ACCESS (MAS) POSTERIOR LUMBAR INTERBODY FUSION (PLIF) LUMBAR FOUR-FIVE;  Surgeon: Eustace Moore, MD;  Location: [facility];  Service: Neurosurgery;;  FOR MAXIMUM ACCESS (MAS) POSTERIOR LUMBAR INTERBODY FUSION (PLIF) LUMBAR FOUR-FIVE   OOPHORECTOMY     LSO   OPEN REDUCTION INTERNAL FIXATION (ORIF) DISTAL RADIAL FRACTURE Left 09/20/2018   Procedure: OPEN REDUCTION INTERNAL FIXATION (ORIF) DISTAL RADIAL FRACTURE, LEFT;  Surgeon: Hessie Knows, MD;  Location: [facility];  Service: Orthopedics;  Laterality: Left;   [place]   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   VAGINAL HYSTERECTOMY  2001   for bleeding and LSO for cyst    Social History Social History   Tobacco Use   Smoking status: Former    Packs/day: 0.30    Years: 4.00    Total pack years: 1.20    Types: Cigarettes    Quit date: 07/11/2002    Years since quitting: 19.4   Smokeless tobacco: Never  Vaping Use   Vaping Use: Never used  Substance Use Topics   Alcohol use: Not Currently    Alcohol/week: 1.0 standard drink of alcohol    Types: 1 Standard drinks or equivalent per week   Drug use: No    Family History Family History  Problem Relation Age of Onset   Heart disease Mother    Lung cancer Mother    Diabetes Mother        later in life   Coronary artery disease Mother    Heart disease Father    Arrhythmia Sister    Breast cancer Sister 42       diagnosed 09/09/2016   Colonic polyp Brother        pre cancer    Allergies  Allergen Reactions   Bee Venom Anaphylaxis   Citalopram Nausea Only   Lactose Intolerance (Gi) Diarrhea     Bloating, upset stomach   Tape Other (See Comments)    Thin skin, rips off skin     REVIEW OF SYSTEMS (Negative unless checked)  Constitutional: '[]'$ Weight loss  '[]'$ Fever  '[]'$ Chills Cardiac: '[]'$ Chest pain   '[]'$ Chest pressure   '[]'$ Palpitations   '[]'$ Shortness of breath when laying flat   '[]'$ Shortness of breath with exertion. Vascular:  '[]'$ Pain in legs with walking   '[x]'$ Pain in legs with standing  '[]'$ History of DVT   '[]'$ Phlebitis   '[x]'$ Swelling in legs   '[]'$ Varicose veins   '[]'$ Non-healing ulcers Pulmonary:   '[]'$ Uses home oxygen   '[]'$ Productive cough   '[]'$ Hemoptysis   '[]'$   Wheeze  '[]'$ COPD   '[]'$ Asthma Neurologic:  '[]'$ Dizziness   '[]'$ Seizures   '[]'$ History of stroke   '[]'$ History of TIA  '[]'$ Aphasia   '[]'$ Vissual changes   '[]'$ Weakness or numbness in arm   '[]'$ Weakness or numbness in leg Musculoskeletal:   '[]'$ Joint swelling   '[]'$ Joint pain   '[]'$ Low back pain Hematologic:  '[]'$ Easy bruising  '[]'$ Easy bleeding   '[]'$ Hypercoagulable state   '[]'$ Anemic Gastrointestinal:  '[]'$ Diarrhea   '[]'$ Vomiting  '[]'$ Gastroesophageal reflux/heartburn   '[]'$ Difficulty swallowing. Genitourinary:  '[]'$ Chronic kidney disease   '[]'$ Difficult urination  '[]'$ Frequent urination   '[]'$ Blood in urine Skin:  '[]'$ Rashes   '[]'$ Ulcers  Psychological:  '[]'$ History of anxiety   '[]'$  History of major depression.  Physical Examination  Vitals:   12/23/21 0848  BP: (!) 176/102  Pulse: 64  Resp: 16  Weight: 288 lb (130.6 kg)   Body mass index is 45.79 kg/m. Gen: WD/WN, NAD Head: [place]/AT, No temporalis wasting.  Ear/Nose/Throat: Hearing grossly intact, nares w/o erythema or drainage, pinna without lesions Eyes: PER, EOMI, sclera nonicteric.  Neck: Supple, no gross masses.  No JVD.  Pulmonary:  Good air movement, no audible wheezing, no use of accessory muscles.  Cardiac: RRR, precordium not hyperdynamic. Vascular:  scattered varicosities present bilaterally.  Mild venous stasis changes to the legs bilaterally.  3-4+ soft pitting edema  Vessel Right Left  Radial Palpable Palpable   Gastrointestinal: soft, non-distended. No guarding/no peritoneal signs.  Musculoskeletal: M/S 5/5 throughout.  No deformity.  Neurologic: CN 2-12 intact. Pain and light touch intact in extremities.  Symmetrical.  Speech is fluent. Motor exam as listed above. Psychiatric: Judgment intact, Mood & affect appropriate for pt's clinical situation. Dermatologic: Venous rashes no ulcers noted.  No changes consistent with cellulitis. Lymph : No lichenification or skin changes of chronic lymphedema.  CBC Lab Results  Component Value Date   WBC 5.0 09/09/2021   HGB 13.3 09/09/2021   HCT 39.0 09/09/2021   MCV 81 09/09/2021   PLT 187 09/09/2021    BMET    Component Value Date/Time   NA 141 09/09/2021 0833   NA 138 11/09/2013 1156   K 4.2 09/09/2021 0833   K 3.9 11/09/2013 1156   CL 107 (H) 09/09/2021 0833   CL 106 11/09/2013 1156   CO2 22 11/13/2020 1156   CO2 26 11/09/2013 1156   GLUCOSE 97 09/09/2021 0833   GLUCOSE 89 07/13/2020 1115   GLUCOSE 100 (H) 11/09/2013 1156   BUN 19 09/09/2021 0833   BUN 16 11/09/2013 1156   CREATININE 0.69 09/09/2021 0833   CREATININE 0.65 11/09/2013 1156   CALCIUM 9.0 09/09/2021 0833   CALCIUM 8.8 11/09/2013 1156   GFRNONAA 96 08/28/2020 0945   GFRNONAA >60 07/13/2020 1115   GFRNONAA >60 11/09/2013 1156   GFRAA 111 08/28/2020 0945   GFRAA >60 11/09/2013 1156   CrCl cannot be calculated (Patient's most recent lab result is older than the maximum 21 days allowed.).  COAG Lab Results  Component Value Date   INR 0.97 07/01/2013    Radiology No results found.   Assessment/Plan 1. Lymphedema Recommend:  No surgery or intervention at this point in time.    I have reviewed my discussion with the patient regarding lymphedema and why it  causes symptoms.  Patient will continue wearing graduated compression on a daily basis. The patient should put the compression on first thing in the morning and removing them in the evening. The patient should  not sleep in the compression.   In  addition, behavioral modification throughout the day will be continued.  This will include frequent elevation (such as in a recliner), use of over the counter pain medications as needed and exercise such as walking.  The systemic causes for chronic edema such as liver, kidney and cardiac etiologies do not appear to have significant changed over the past year.    Despite conservative treatments including graduated compression therapy class 1 and behavioral modification including exercise and elevation the patient  has not obtained adequate control of the lymphedema.  The patient still has stage 3 lymphedema and therefore, I believe that a lymph pump should be added to improve the control of the patient's lymphedema.  Additionally, a lymph pump is warranted because it will reduce the risk of cellulitis and ulceration in the future.  Patient should follow-up in six months    2. Chronic venous insufficiency The patient does have evidence for superficial reflux and I did discuss venous ablation and treatment of her superficial system.  I feel that given her deep venous insufficiency in association with her leg swelling as a dominant issue that moving forward with the lymph pump at this time is likely the best course of action.  Once we see how much benefit were gaining from the lymph pump as well as her continued exercise elevation and compression then we can readdress treatment of her superficial reflux.  3. Essential hypertension Continue antihypertensive medications as already ordered, these medications have been reviewed and there are no changes at this time.   4. Pure hypercholesterolemia Continue statin as ordered and reviewed, no changes at this time     Hortencia Pilar, MD  12/23/2021 8:56 AM

## 2022-01-04 ENCOUNTER — Telehealth (INDEPENDENT_AMBULATORY_CARE_PROVIDER_SITE_OTHER): Payer: Self-pay | Admitting: Vascular Surgery

## 2022-01-05 NOTE — Telephone Encounter (Signed)
sent 

## 2022-01-12 ENCOUNTER — Other Ambulatory Visit: Payer: Self-pay

## 2022-01-12 DIAGNOSIS — F5101 Primary insomnia: Secondary | ICD-10-CM

## 2022-01-13 MED ORDER — ZOLPIDEM TARTRATE ER 12.5 MG PO TBCR: 12.5000 mg | EXTENDED_RELEASE_TABLET | Freq: Every evening | ORAL | 0 refills | Status: DC | PRN

## 2022-01-13 NOTE — Telephone Encounter (Signed)
Renew x 1, no additional refills without RTC [facility]

## 2022-01-17 ENCOUNTER — Other Ambulatory Visit: Payer: Self-pay | Admitting: Physician Assistant

## 2022-01-17 DIAGNOSIS — E559 Vitamin D deficiency, unspecified: Secondary | ICD-10-CM

## 2022-02-03 ENCOUNTER — Encounter: Payer: Self-pay | Admitting: Physician Assistant

## 2022-02-03 ENCOUNTER — Ambulatory Visit: Payer: Self-pay | Admitting: Physician Assistant

## 2022-02-03 VITALS — BP 151/80 | HR 95 | Resp 14 | Ht 66.5 in | Wt 288.0 lb

## 2022-02-03 DIAGNOSIS — E162 Hypoglycemia, unspecified: Secondary | ICD-10-CM

## 2022-02-03 DIAGNOSIS — H0100B Unspecified blepharitis left eye, upper and lower eyelids: Secondary | ICD-10-CM

## 2022-02-03 MED ORDER — BACITRACIN 500 UNIT/GM EX OINT: 1.0000 | TOPICAL_OINTMENT | Freq: Two times a day (BID) | CUTANEOUS | 0 refills | Status: DC

## 2022-02-03 MED ORDER — HYDROXYZINE PAMOATE 25 MG PO CAPS: 25.0000 mg | ORAL_CAPSULE | Freq: Three times a day (TID) | ORAL | 0 refills | Status: DC | PRN

## 2022-02-03 NOTE — Progress Notes (Signed)
Pt woke up with left eyelid swollen. No pain just uncomfortable.

## 2022-02-03 NOTE — Progress Notes (Signed)
   Subjective: Swollen left upper eyelid    Patient ID: Mary [name], female    DOB: [birth year], 61 y.o.   MRN: [id number]  HPI Patient awakened this morning with swollen left upper eyelid.  Patient denies vision changes.  Patient states she is taking pool therapy for weight loss and was splashed in the eye 2 days ago.  Patient also voices concern for hypoglycemia episodes in the past week.  Patient state her blood sugar has been as low as 60.   Review of Systems Hypertension, hyperlipidemia, hypothyroidism, and obstructive sleep apnea.  History of gastric bypass    Objective:   Physical Exam BP is 151/80, pulse 95, respiration 14, temperature 9 97.5, patient 95% O2 sat on room air.  Patient weighs 285 pounds and BMI is 45.79. Examination of the left side shows upper eyelid edema but no erythema.  Conjunctiva is not erythematous or edematous.  No drainage.       Assessment & Plan: Blepharitis   Patient advised to apply compresses to area 5 to 10 minutes times a day.  Patient given prescription for bacitracin ointment and Atarax.  Patient also have a hemoglobin A1c drawn today.  Patient will follow-up in 5 days.

## 2022-02-04 LAB — HGB A1C W/O EAG: Hgb A1c MFr Bld: 6.3 % — ABNORMAL HIGH (ref 4.8–5.6)

## 2022-02-07 ENCOUNTER — Encounter: Payer: Self-pay | Admitting: Physician Assistant

## 2022-02-07 ENCOUNTER — Ambulatory Visit: Payer: Self-pay | Admitting: Physician Assistant

## 2022-02-07 VITALS — BP 150/74 | HR 72 | Temp 96.8°F | Resp 16 | Wt 289.0 lb

## 2022-02-07 DIAGNOSIS — E162 Hypoglycemia, unspecified: Secondary | ICD-10-CM

## 2022-02-07 NOTE — Progress Notes (Signed)
   Subjective: Hypoglycemia    Patient ID: Mary [name], female    DOB: [birth year], 61 y.o.   MRN: [id number]  HPI Patient complain of low blood sugar causing fatigue which occurs mostly during the day.  Patient has been monitoring her glucose and reports episodes of readings in the low 60s.  Patient states this is mainly after eating meals.  Patient also relates that she has a low carb diet due to family history of type 2 diabetes.  Hemoglobin A1c 4 days ago was 6.3.   Review of Systems Hyperlipidemia, hypertension, obstructive sleep apnea, palpitations, and peripheral edema to the lower extremities.    Objective:   Physical Exam BP is 150/74, pulse 72, respirations 16, temperature 96.8, and patient 90% O2 sat on room air.  Patient weighs 289 pounds and BMI is 45.95 Review of labs reveal hyperlipidemia for cholesterol 245.  Triglycerides and HDL within normal range.  Hemoglobin A1c 6.3.       Assessment & Plan: Hypoglycemia  Advised patient this time to discontinue her diuretics for peripheral edema.  Continue keeping logs of her glucose pending consult endocrinologist.

## 2022-02-07 NOTE — Progress Notes (Signed)
Here for f/u with A1C to discuss from last week.  Stated left eye is improved.

## 2022-02-08 NOTE — Addendum Note (Signed)
Addended by: Aliene Altes on: 02/08/2022 10:40 AM   Modules accepted: Orders

## 2022-02-16 ENCOUNTER — Ambulatory Visit (INDEPENDENT_AMBULATORY_CARE_PROVIDER_SITE_OTHER): Payer: 59 | Admitting: "Endocrinology

## 2022-02-16 ENCOUNTER — Encounter: Payer: Self-pay | Admitting: "Endocrinology

## 2022-02-16 VITALS — BP 136/82 | HR 68 | Ht 66.5 in | Wt 288.8 lb

## 2022-02-16 DIAGNOSIS — R7303 Prediabetes: Secondary | ICD-10-CM | POA: Diagnosis not present

## 2022-02-16 DIAGNOSIS — E782 Mixed hyperlipidemia: Secondary | ICD-10-CM | POA: Diagnosis not present

## 2022-02-16 DIAGNOSIS — E559 Vitamin D deficiency, unspecified: Secondary | ICD-10-CM

## 2022-02-16 DIAGNOSIS — E039 Hypothyroidism, unspecified: Secondary | ICD-10-CM | POA: Diagnosis not present

## 2022-02-16 DIAGNOSIS — E161 Other hypoglycemia: Secondary | ICD-10-CM

## 2022-02-16 DIAGNOSIS — I10 Essential (primary) hypertension: Secondary | ICD-10-CM

## 2022-02-16 MED ORDER — FREESTYLE LIBRE 3 SENSOR MISC: 1.0000 | 2 refills | Status: DC

## 2022-02-16 NOTE — Progress Notes (Signed)
Endocrinology Consult Note       02/16/2022, 5:26 PM   Subjective:    Patient ID: Mary [name], female    DOB: [birth year].  Mary [name] is being seen in consultation for management of currently uncontrolled symptomatic diabetes requested by  Adin Hector, MD.   Past Medical History:  Diagnosis Date   Anemia    low iron at times   Arthritis    Colon polyps    Complication of anesthesia 1991   Pt reports she awoke during plantar fascia surgery and felt "everything"   Heart murmur    " small per patient"    Hyperlipidemia    Hypertension    Hypothyroidism    Lactose intolerance    PAC (premature atrial contraction)    Palpitations    Panic attacks    Pt reports restrictive clothing/areas and dry mouth cause her to have attack.   Peripheral vascular disease ([place])    "small" clot after thermal ablasion   Pneumonia 2016   "walking pneumonia" 3 times in 2016   Shortness of breath    going upstairs (due to weight). not so much since weight loss   Sleep apnea    after weight loss does not have to use cpap   Vitamin B12 deficiency    Vitamin D deficiency     Past Surgical History:  Procedure Laterality Date   [place]   twins   COLONOSCOPY     COLONOSCOPY WITH PROPOFOL N/A 02/27/2018   Procedure: COLONOSCOPY WITH PROPOFOL;  Surgeon: Toledo, Benay Pike, MD;  Location: [facility];  Service: Gastroenterology;  Laterality: N/A;   IRRIGATION AND DEBRIDEMENT HEMATOMA Right 11/12/2015   Procedure: IRRIGATION AND DEBRIDEMENT HEMATOMA;  Surgeon: Robert Bellow, MD;  Location: [facility];  Service: General;  Laterality: Right;   KNEE ARTHROSCOPY WITH MEDIAL MENISECTOMY Left 07/15/2020   Procedure: Left knee arthroscopy with partial medial or lateral menisectomy, possible chondroplasty, possible partial synovectomy;  Surgeon: Lovell Sheehan, MD;  Location: [facility]  ORS;  Service: Orthopedics;  Laterality: Left;   LAMINECTOMY  07/10/2013   L4   L5   DR Ronnald Ramp   LAPAROSCOPIC SALPINGO OOPHERECTOMY Right 04/17/2014   Procedure: LAPAROSCOPIC SALPINGO OOPHORECTOMY RIGHT;  Surgeon: Everitt Amber, MD;  Location: [facility];  Service: Gynecology;  Laterality: Right;   LYSIS OF ADHESION  04/17/2014   Procedure: LYSIS OF ADHESION;  Surgeon: Everitt Amber, MD;  Location: [facility];  Service: Gynecology;;   MAXIMUM ACCESS (MAS)POSTERIOR LUMBAR INTERBODY FUSION (PLIF) 1 LEVEL  07/10/2013   Procedure: FOR MAXIMUM ACCESS (MAS) POSTERIOR LUMBAR INTERBODY FUSION (PLIF) LUMBAR FOUR-FIVE;  Surgeon: Eustace Moore, MD;  Location: [facility];  Service: Neurosurgery;;  FOR MAXIMUM ACCESS (MAS) POSTERIOR LUMBAR INTERBODY FUSION (PLIF) LUMBAR FOUR-FIVE   OOPHORECTOMY     LSO   OPEN REDUCTION INTERNAL FIXATION (ORIF) DISTAL RADIAL FRACTURE Left 09/20/2018   Procedure: OPEN REDUCTION INTERNAL FIXATION (ORIF) DISTAL RADIAL FRACTURE, LEFT;  Surgeon: Hessie Knows, MD;  Location: [facility] OR[facility];  Service: Orthopedics;  Laterality: Left;   PLANTAR FASCIA SURGERY  1999   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   VAGINAL HYSTERECTOMY  2001   for bleeding and LSO for cyst    Social History   Socioeconomic History   Marital status: Married    Spouse name: Not on file   Number of children: 2   Years of education: Not on file   Highest education level: Not on file  Occupational History   Occupation: parking Engineer, water: [employer]  Tobacco Use   Smoking status: Former    Packs/day: 0.30    Years: 4.00    Total pack years: 1.20    Types: Cigarettes    Quit date: 07/11/2002    Years since quitting: 19.6   Smokeless tobacco: Never  Vaping Use   Vaping Use: Never used  Substance and Sexual Activity   Alcohol use: Not Currently    Alcohol/week: 1.0 standard drink of alcohol    Types: 1 Standard drinks or equivalent per week   Drug use: No   Sexual activity: Yes    Birth  control/protection: Surgical    Comment: HYST  Other Topics Concern   Not on file  Social History Narrative   Not on file   Social Determinants of Health   Financial Resource Strain: Not on file  Food Insecurity: Not on file  Transportation Needs: Not on file  Physical Activity: Not on file  Stress: Not on file  Social Connections: Not on file    Family History  Problem Relation Age of Onset   Heart disease Mother    Lung cancer Mother    Diabetes Mother        later in life   Coronary artery disease Mother    Osteoporosis Mother    Diabetes Father    Heart disease Father    Arrhythmia Sister    Breast cancer Sister 54       diagnosed 09/09/2016   Colonic polyp Brother        pre cancer    Outpatient Encounter Medications as of 02/16/2022  Medication Sig   Continuous Blood Gluc Sensor (FREESTYLE LIBRE 3 SENSOR) MISC 1 Piece by Does not apply route every 14 (fourteen) days. Place 1 sensor on the skin every 14 days. Use to check glucose continuously   cyanocobalamin (,VITAMIN B-12,) 1000 MCG/ML injection Inject 1 mL (1,000 mcg total) into the muscle every 14 (fourteen) days.   EPINEPHrine 0.3 mg/0.3 mL IJ SOAJ injection Inject 0.3 mLs into the muscle as needed.   hydrochlorothiazide (HYDRODIURIL) 25 MG tablet Take 1 tablet (25 mg total) by mouth daily. (Patient not taking: Reported on 02/16/2022)   hydrOXYzine (VISTARIL) 25 MG capsule Take 1 capsule (25 mg total) by mouth every 8 (eight) hours as needed.   levothyroxine (SYNTHROID) 100 MCG tablet Take 1 tablet (100 mcg total) by mouth daily before breakfast.   meclizine (ANTIVERT) 25 MG tablet Take 1 tablet (25 mg total) by mouth 3 (three) times daily as needed for dizziness or nausea.   meloxicam (MOBIC) 15 MG tablet Take 15 mg by mouth daily as needed for pain.   methocarbamol (ROBAXIN) 500 MG tablet Take 500 mg by mouth 2 (two) times daily as needed.   Vitamin D, Ergocalciferol, (DRISDOL) 1.25 MG (50000 UNIT) CAPS capsule TAKE  1 CAPSULE BY MOUTH ONE TIME PER WEEK   zolpidem (AMBIEN CR) 12.5 MG CR tablet Take 1 tablet (12.5 mg total) by mouth at bedtime as needed for sleep.   [DISCONTINUED]  bacitracin 500 UNIT/GM ointment Apply 1 Application topically 2 (two) times daily.   No facility-administered encounter medications on file as of 02/16/2022.    ALLERGIES: Allergies  Allergen Reactions   Bee Venom Anaphylaxis   Citalopram Nausea Only   Lactose Intolerance (Gi) Diarrhea    Bloating, upset stomach   Tape Other (See Comments)    Thin skin, rips off skin    VACCINATION STATUS: Immunization History  Administered Date(s) Administered   Influenza,inj,Quad PF,6+ Mos 04/05/2019, 05/01/2020   Influenza-Unspecified 07/14/2009, 05/09/2017, 05/13/2021   PFIZER(Purple Top)SARS-COV-2 Vaccination 11/02/2019, 11/29/2019   Pneumococcal Polysaccharide-23 12/15/2014   Td 12/18/1992, 12/12/2016   Tdap 07/11/2006   Zoster Recombinat (Shingrix) 01/26/2018, 07/21/2018    Hypoglycemia This is a new problem. The current episode started more than 1 month ago. The problem occurs intermittently. The problem has been unchanged. Associated symptoms include fatigue. Pertinent negatives include no arthralgias, chest pain, chills, coughing, fever, headaches, myalgias, nausea, rash, vomiting or weakness. She has tried nothing for the symptoms.  Hyperlipidemia This is a chronic problem. The current episode started more than 1 year ago. The problem is uncontrolled. Pertinent negatives include no chest pain, myalgias or shortness of breath. She is currently on no antihyperlipidemic treatment. Risk factors for coronary artery disease include dyslipidemia, hypertension, family history, obesity, a sedentary lifestyle and post-menopausal.  Hypertension This is a chronic problem. The current episode started more than 1 year ago. Pertinent negatives include no chest pain, headaches, palpitations or shortness of breath. Risk factors for coronary  artery disease include dyslipidemia, obesity, sedentary lifestyle and family history. Past treatments include diuretics. Identifiable causes of hypertension include a thyroid problem.  Thyroid Problem Presents for initial visit. The condition has lasted for 20 years. Symptoms include fatigue. Patient reports no cold intolerance, diarrhea, heat intolerance or palpitations. Past treatments include levothyroxine. The following procedures have not been performed: thyroidectomy. Her past medical history is significant for hyperlipidemia.     Review of Systems  Constitutional:  Positive for fatigue. Negative for chills, fever and unexpected weight change.       She weighed 290 pounds prior to her Roux-en-Y gastric bypass in 2007.  Subsequently she lost 60s+ pounds, however more recently she is gained almost all of the weight back to current level of 288 pounds.  HENT:  Negative for trouble swallowing and voice change.   Eyes:  Negative for visual disturbance.  Respiratory:  Negative for cough, shortness of breath and wheezing.   Cardiovascular:  Negative for chest pain, palpitations and leg swelling.  Gastrointestinal:  Negative for diarrhea, nausea and vomiting.  Endocrine: Negative for cold intolerance, heat intolerance, polydipsia, polyphagia and polyuria.  Musculoskeletal:  Negative for arthralgias and myalgias.  Skin:  Negative for color change, pallor, rash and wound.  Neurological:  Negative for seizures, weakness and headaches.  Psychiatric/Behavioral:  Negative for confusion and suicidal ideas.     Objective:       02/16/2022    1:28 PM 02/07/2022    1:16 PM 02/03/2022    8:54 AM  Vitals with BMI  Height 5' 6.5"  5' 6.5"  Weight 288 lbs 13 oz 289 lbs 288 lbs  BMI 45.92 83.15 17.61  Systolic 607 371 062  Diastolic 82 74 80  Pulse 68 72 95    BP 136/82   Pulse 68   Ht 5' 6.5" (1.689 m)   Wt 288 lb 12.8 oz (131 kg)   BMI 45.92 kg/m   Wt Readings from Last 3  Encounters:   02/16/22 288 lb 12.8 oz (131 kg)  02/07/22 289 lb (131.1 kg)  02/03/22 288 lb (130.6 kg)     Physical Exam Constitutional:      Appearance: She is well-developed.  HENT:     Head: Normocephalic and atraumatic.  Neck:     Thyroid: No thyromegaly.     Trachea: No tracheal deviation.  Cardiovascular:     Rate and Rhythm: Normal rate and regular rhythm.  Pulmonary:     Effort: Pulmonary effort is normal.     Breath sounds: Normal breath sounds.  Abdominal:     General: Bowel sounds are normal.     Palpations: Abdomen is soft.     Tenderness: There is no abdominal tenderness. There is no guarding.  Musculoskeletal:        General: Normal range of motion.     Cervical back: Normal range of motion and neck supple.  Skin:    General: Skin is warm and dry.     Coloration: Skin is not pale.     Findings: No erythema or rash.  Neurological:     Mental Status: She is alert and oriented to person, place, and time.     Cranial Nerves: No cranial nerve deficit.     Coordination: Coordination normal.     Deep Tendon Reflexes: Reflexes are normal and symmetric.  Psychiatric:        Judgment: Judgment normal.       CMP ( most recent) CMP     Component Value Date/Time   NA 141 09/09/2021 0833   NA 138 11/09/2013 1156   K 4.2 09/09/2021 0833   K 3.9 11/09/2013 1156   CL 107 (H) 09/09/2021 0833   CL 106 11/09/2013 1156   CO2 22 11/13/2020 1156   CO2 26 11/09/2013 1156   GLUCOSE 97 09/09/2021 0833   GLUCOSE 89 07/13/2020 1115   GLUCOSE 100 (H) 11/09/2013 1156   BUN 19 09/09/2021 0833   BUN 16 11/09/2013 1156   CREATININE 0.69 09/09/2021 0833   CREATININE 0.65 11/09/2013 1156   CALCIUM 9.0 09/09/2021 0833   CALCIUM 8.8 11/09/2013 1156   PROT 6.4 09/09/2021 0833   ALBUMIN 4.2 09/09/2021 0833   AST 14 09/09/2021 0833   ALT 12 09/09/2021 0833   ALKPHOS 119 09/09/2021 0833   BILITOT 0.3 09/09/2021 0833   GFRNONAA 96 08/28/2020 0945   GFRNONAA >60 07/13/2020 1115    GFRNONAA >60 11/09/2013 1156   GFRAA 111 08/28/2020 0945   GFRAA >60 11/09/2013 1156     Diabetic Labs (most recent): Lab Results  Component Value Date   HGBA1C 6.3 (H) 02/03/2022   HGBA1C 6.0 (H) 01/08/2020     Lipid Panel ( most recent) Lipid Panel     Component Value Date/Time   CHOL 245 (H) 09/09/2021 0833   TRIG 99 09/09/2021 0833   HDL 73 09/09/2021 0833   CHOLHDL 3.4 09/09/2021 0833   LDLCALC 155 (H) 09/09/2021 0833   LABVLDL 17 09/09/2021 0833      Lab Results  Component Value Date   TSH 3.900 09/09/2021   TSH 3.450 08/28/2020   TSH 3.770 01/08/2020   TSH 4.610 (H) 10/01/2019   TSH 2.960 02/28/2019   TSH 5.870 (H) 12/21/2018           Assessment & Plan:   1. Reactive hypoglycemia 2. Prediabetes 3. Mixed hyperlipidemia 4. Hypothyroidism, unspecified type  Recent labs reviewed.  History is obtained directly from the patient  as well as chart review.  Accordingly, she weighed as high as 290 pounds prior to her Roux-en-Y surgery in 2007 which allowed her to lose 66 pounds before she started to regain most of that weight back to her current weight of 288 pounds. - I had a long discussion with her about possible causes of hypoglycemia in the background history of bariatric surgery.   -She most likely has bariatric surgery associated hypoglycemia versus reactive hypoglycemia. -Treatment is dietary modification. -In light of her metabolic dysfunction indicated by obesity, hyperlipidemia, prediabetes, hypertension, sedentary life, she is not extremely high risk for type 2 diabetes and associated comorbidities/complications.    These are all discussed in detail with her. She is a good candidate for lifestyle medicine.  - she acknowledges that there is a room for improvement in her food and drink choices. - Suggestion is made for her to avoid simple carbohydrates  from her diet including Cakes, Sweet Desserts, Ice Cream, Soda (diet and regular), Sweet Tea,  Candies, Chips, Cookies, Store Bought Juices, Alcohol , Artificial Sweeteners,  Coffee Creamer, and "Sugar-free" Products, Lemonade. This will help patient to have more stable blood glucose profile and potentially avoid unintended weight gain.  The following Lifestyle Medicine recommendations according to [facility]) were discussed and and offered to patient and she  agrees to start the journey:  A. Whole Foods, Plant-Based Nutrition comprising of fruits and vegetables, plant-based proteins, whole-grain carbohydrates was discussed in detail with the patient.   A list for source of those nutrients were also provided to the patient.  Patient will use only water or unsweetened tea for hydration. B.  The need to stay away from risky substances including alcohol, smoking; obtaining 7 to 9 hours of restorative sleep, at least 150 minutes of moderate intensity exercise weekly, the importance of healthy social connections,  and stress management techniques were discussed. C.  A full color page of  Calorie density of various food groups per pound showing examples of each food groups was provided to the patient.  - I have approached her with the following plan to manage  her diabetes and patient agrees:   -She will benefit from a CGM.  I discussed and prescribed the freestyle libre 3 device for her.  If she cannot get this device, she is advised to monitor blood glucose fasting, at bedtime, and during times of symptoms before correction. She is asked to return in 3 weeks with her blood glucose readings/meter.  - Specific targets for  A1c;  LDL, HDL,  and Triglycerides were discussed with the patient.  2) Blood Pressure /Hypertension:  her blood pressure is  controlled to target.   she is advised to continue her current medications including endocarditis 25 mg p.o. daily with breakfast . 3) Lipids/Hyperlipidemia:  She has uncontrolled lipid panel LDL 155. Medicine will be  given a chance until next measurement in 3 months.  If she engages for food plant-based diet will lower her cholesterol to optimal levels.  If she has problems engaging, she will be considered for statin intervention.   4)  Weight/Diet:  Body mass index is 45.92 kg/m.  -   clearly complicating her diabetes care.   she is  a candidate for weight loss. I discussed with her the fact that loss of 5 - 10% of her  current body weight will have the most impact on her diabetes management.  The above detailed  ACLM recommendations for nutrition, exercise,  sleep, social life, avoidance of risky substances, the need for restorative sleep   information will also detailed on discharge instructions.  5) hypothyroidism : the circumstance of her diagnosis is not available to review.  She is currently on levothyroxine 100 mcg p.o. daily before breakfast.  Her recent labs are consistent with appropriate replacement.  - We discussed about the correct intake of her thyroid hormone, on empty stomach at fasting, with water, separated by at least 30 minutes from breakfast and other medications,  and separated by more than 4 hours from calcium, iron, multivitamins, acid reflux medications (PPIs). -Patient is made aware of the fact that thyroid hormone replacement is needed for life, dose to be adjusted by periodic monitoring of thyroid function tests.   6) Chronic Care/Health Maintenance:  -she is advised to stay away from smoking. I have recommended yearly flu vaccine and pneumonia vaccine at least every 5 years; moderate intensity exercise for up to 150 minutes weekly; and  sleep for 7- 9 hours a day.  - she is  advised to maintain close follow up with Adin Hector, MD for primary care needs, as well as her other providers for optimal and coordinated care.   I spent 62 minutes in the care of the patient today including review of labs from [place], Lipids, Thyroid Function, Hematology (current and previous including  abstractions from other facilities); face-to-face time discussing  her blood glucose readings/logs, discussing hypoglycemia and hyperglycemia episodes and symptoms, medications doses, her options of short and long term treatment based on the latest standards of care / guidelines;  discussion about incorporating lifestyle medicine;  and documenting the encounter. Risk reduction counseling performed per USPSTF guidelines to reduce obesity and cardiovascular risk factors.      Please refer to Patient Instructions for Blood Glucose Monitoring and Insulin/Medications Dosing Guide"  in media tab for additional information. Please  also refer to " Patient Self Inventory" in the Media  tab for reviewed elements of pertinent patient history.  Mary [name] participated in the discussions, expressed understanding, and voiced agreement with the above plans.  All questions were answered to her satisfaction. she is encouraged to contact clinic should she have any questions or concerns prior to her return visit.   Follow up plan: - Return in about 3 weeks (around 03/09/2022) for F/U with Meter/CGM /Logs Only - no Labs.  Glade Lloyd, MD [facility] [address] [place], [place] [address] Phone: [phone]  Fax: [phone]    02/16/2022, 5:27 PM  This note was partially dictated with voice recognition software. Similar sounding words can be transcribed inadequately or may not  be corrected upon review.

## 2022-02-16 NOTE — Patient Instructions (Signed)

## 2022-02-17 ENCOUNTER — Telehealth: Payer: Self-pay | Admitting: "Endocrinology

## 2022-02-17 DIAGNOSIS — E161 Other hypoglycemia: Secondary | ICD-10-CM

## 2022-02-17 MED ORDER — DEXCOM G7 RECEIVER DEVI: 0 refills | Status: AC

## 2022-02-17 MED ORDER — DEXCOM G7 SENSOR MISC: 2 refills | Status: DC

## 2022-02-17 NOTE — Telephone Encounter (Signed)
Discussed with pt the differences between CGM devices of Dexcom and Libre. Pt requested Rx for Dexcom. Rx for Dexcom G7 CGM sent to pt's preferred pharmacy.

## 2022-02-17 NOTE — Telephone Encounter (Signed)
Patient states her insurance is requiring a PA for the [place]. She spoke with [facility] and they preview the dexcom but she says with a PA she can get the [place]. Please advise with pt

## 2022-03-08 ENCOUNTER — Other Ambulatory Visit: Payer: Self-pay

## 2022-03-08 DIAGNOSIS — G4700 Insomnia, unspecified: Secondary | ICD-10-CM

## 2022-03-08 MED ORDER — ZOLPIDEM TARTRATE ER 12.5 MG PO TBCR: 12.5000 mg | EXTENDED_RELEASE_TABLET | Freq: Every evening | ORAL | 5 refills | Status: DC | PRN

## 2022-03-09 ENCOUNTER — Encounter: Payer: Self-pay | Admitting: "Endocrinology

## 2022-03-09 ENCOUNTER — Ambulatory Visit (INDEPENDENT_AMBULATORY_CARE_PROVIDER_SITE_OTHER): Payer: 59 | Admitting: "Endocrinology

## 2022-03-09 VITALS — BP 142/86 | HR 72 | Ht 66.5 in | Wt 281.6 lb

## 2022-03-09 DIAGNOSIS — E161 Other hypoglycemia: Secondary | ICD-10-CM

## 2022-03-09 DIAGNOSIS — R7303 Prediabetes: Secondary | ICD-10-CM | POA: Diagnosis not present

## 2022-03-09 DIAGNOSIS — E782 Mixed hyperlipidemia: Secondary | ICD-10-CM | POA: Diagnosis not present

## 2022-03-09 DIAGNOSIS — I10 Essential (primary) hypertension: Secondary | ICD-10-CM

## 2022-03-09 DIAGNOSIS — E559 Vitamin D deficiency, unspecified: Secondary | ICD-10-CM

## 2022-03-09 DIAGNOSIS — E039 Hypothyroidism, unspecified: Secondary | ICD-10-CM | POA: Diagnosis not present

## 2022-03-09 MED ORDER — DEXCOM G7 SENSOR MISC: 2 refills | Status: DC

## 2022-03-09 MED ORDER — HYDROCHLOROTHIAZIDE 25 MG PO TABS: 25.0000 mg | ORAL_TABLET | Freq: Every day | ORAL | 3 refills | Status: DC

## 2022-03-09 NOTE — Patient Instructions (Signed)

## 2022-03-09 NOTE — Addendum Note (Signed)
Addended by: Cassandria Anger on: 03/09/2022 05:04 PM   Modules accepted: Orders

## 2022-03-09 NOTE — Progress Notes (Signed)
03/09/2022, 4:27 PM   Endocrinology follow-up note  Subjective:    Patient ID: Mary [name], female    DOB: 03/02/1961.  Mary [name] is being seen in follow-up after she was sitting consultation for management of currently uncontrolled symptomatic diabetes requested by  Adin Hector, MD.   Past Medical History:  Diagnosis Date   Anemia    low iron at times   Arthritis    Colon polyps    Complication of anesthesia 1991   Pt reports she awoke during plantar fascia surgery and felt "everything"   Heart murmur    " small per patient"    Hyperlipidemia    Hypertension    Hypothyroidism    Lactose intolerance    PAC (premature atrial contraction)    Palpitations    Panic attacks    Pt reports restrictive clothing/areas and dry mouth cause her to have attack.   Peripheral vascular disease ([place])    "small" clot after thermal ablasion   Pneumonia 2016   "walking pneumonia" 3 times in 2016   Shortness of breath    going upstairs (due to weight). not so much since weight loss   Sleep apnea    after weight loss does not have to use cpap   Vitamin B12 deficiency    Vitamin D deficiency     Past Surgical History:  Procedure Laterality Date   National   twins   COLONOSCOPY     COLONOSCOPY WITH PROPOFOL N/A 02/27/2018   Procedure: COLONOSCOPY WITH PROPOFOL;  Surgeon: Toledo, Benay Pike, MD;  Location: [facility];  Service: Gastroenterology;  Laterality: N/A;   IRRIGATION AND DEBRIDEMENT HEMATOMA Right 11/12/2015   Procedure: IRRIGATION AND DEBRIDEMENT HEMATOMA;  Surgeon: Robert Bellow, MD;  Location: [facility];  Service: General;  Laterality: Right;   KNEE ARTHROSCOPY WITH MEDIAL MENISECTOMY Left 07/15/2020   Procedure: Left knee arthroscopy with partial medial or lateral menisectomy, possible chondroplasty, possible partial synovectomy;  Surgeon: Lovell Sheehan, MD;  Location: [facility];  Service: Orthopedics;  Laterality: Left;   LAMINECTOMY  07/10/2013   L4   L5   DR Ronnald Ramp   LAPAROSCOPIC SALPINGO OOPHERECTOMY Right 04/17/2014   Procedure: LAPAROSCOPIC SALPINGO OOPHORECTOMY RIGHT;  Surgeon: Everitt Amber, MD;  Location: [facility];  Service: Gynecology;  Laterality: Right;   LYSIS OF ADHESION  04/17/2014   Procedure: LYSIS OF ADHESION;  Surgeon: Everitt Amber, MD;  Location: [facility];  Service: Gynecology;;   MAXIMUM ACCESS (MAS)POSTERIOR LUMBAR INTERBODY FUSION (PLIF) 1 LEVEL  07/10/2013   Procedure: FOR MAXIMUM ACCESS (MAS) POSTERIOR LUMBAR INTERBODY FUSION (PLIF) LUMBAR FOUR-FIVE;  Surgeon: Eustace Moore, MD;  Location: [facility];  Service: Neurosurgery;;  FOR MAXIMUM ACCESS (MAS) POSTERIOR LUMBAR INTERBODY FUSION (PLIF) LUMBAR FOUR-FIVE   OOPHORECTOMY     LSO   OPEN REDUCTION INTERNAL FIXATION (ORIF) DISTAL RADIAL FRACTURE Left 09/20/2018   Procedure: OPEN REDUCTION INTERNAL FIXATION (ORIF) DISTAL RADIAL FRACTURE, LEFT;  Surgeon: Hessie Knows, MD;  Location: [facility];  Service: Orthopedics;  Laterality: Left;   [place]  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   VAGINAL HYSTERECTOMY  2001   for bleeding and LSO for cyst    Social History   Socioeconomic History   Marital status: Married    Spouse name: Not on file   Number of children: 2   Years of education: Not on file   Highest education level: Not on file  Occupational History   Occupation: parking Engineer, water: [employer]  Tobacco Use   Smoking status: Former    Packs/day: 0.30    Years: 4.00    Total pack years: 1.20    Types: Cigarettes    Quit date: 07/11/2002    Years since quitting: 19.6   Smokeless tobacco: Never  Vaping Use   Vaping Use: Never used  Substance and Sexual Activity   Alcohol use: Not Currently    Alcohol/week: 1.0 standard drink of alcohol    Types: 1 Standard drinks or equivalent per week   Drug use: No   Sexual  activity: Yes    Birth control/protection: Surgical    Comment: HYST  Other Topics Concern   Not on file  Social History Narrative   Not on file   Social Determinants of Health   Financial Resource Strain: Not on file  Food Insecurity: Not on file  Transportation Needs: Not on file  Physical Activity: Not on file  Stress: Not on file  Social Connections: Not on file    Family History  Problem Relation Age of Onset   Heart disease Mother    Lung cancer Mother    Diabetes Mother        later in life   Coronary artery disease Mother    Osteoporosis Mother    Diabetes Father    Heart disease Father    Arrhythmia Sister    Breast cancer Sister 65       diagnosed 09/09/2016   Colonic polyp Brother        pre cancer    Outpatient Encounter Medications as of 03/09/2022  Medication Sig   Continuous Blood Gluc Receiver (DEXCOM G7 RECEIVER) DEVI Use to check glucose as directed   Continuous Blood Gluc Sensor (DEXCOM G7 SENSOR) MISC Change sensor every 10 days   cyanocobalamin (,VITAMIN B-12,) 1000 MCG/ML injection Inject 1 mL (1,000 mcg total) into the muscle every 14 (fourteen) days.   EPINEPHrine 0.3 mg/0.3 mL IJ SOAJ injection Inject 0.3 mLs into the muscle as needed.   hydrochlorothiazide (HYDRODIURIL) 25 MG tablet Take 1 tablet (25 mg total) by mouth daily.   hydrOXYzine (VISTARIL) 25 MG capsule Take 1 capsule (25 mg total) by mouth every 8 (eight) hours as needed.   levothyroxine (SYNTHROID) 100 MCG tablet Take 1 tablet (100 mcg total) by mouth daily before breakfast.   meclizine (ANTIVERT) 25 MG tablet Take 1 tablet (25 mg total) by mouth 3 (three) times daily as needed for dizziness or nausea.   meloxicam (MOBIC) 15 MG tablet Take 15 mg by mouth daily as needed for pain.   methocarbamol (ROBAXIN) 500 MG tablet Take 500 mg by mouth 2 (two) times daily as needed.   Vitamin D, Ergocalciferol, (DRISDOL) 1.25 MG (50000 UNIT) CAPS capsule TAKE 1 CAPSULE BY MOUTH ONE TIME PER WEEK    zolpidem (AMBIEN CR) 12.5 MG CR tablet Take 1 tablet (12.5 mg total) by mouth at bedtime as needed for sleep.   [DISCONTINUED] hydrochlorothiazide (HYDRODIURIL) 25 MG tablet Take 1 tablet (25 mg total) by mouth daily. (  Patient not taking: Reported on 02/16/2022)   No facility-administered encounter medications on file as of 03/09/2022.    ALLERGIES: Allergies  Allergen Reactions   Bee Venom Anaphylaxis   Citalopram Nausea Only   Lactose Intolerance (Gi) Diarrhea    Bloating, upset stomach   Tape Other (See Comments)    Thin skin, rips off skin    VACCINATION STATUS: Immunization History  Administered Date(s) Administered   Influenza,inj,Quad PF,6+ Mos 04/05/2019, 05/01/2020   Influenza-Unspecified 07/14/2009, 05/09/2017, 05/13/2021   PFIZER(Purple Top)SARS-COV-2 Vaccination 11/02/2019, 11/29/2019   Pneumococcal Polysaccharide-23 12/15/2014   Td 12/18/1992, 12/12/2016   Tdap 07/11/2006   Zoster Recombinat (Shingrix) 01/26/2018, 07/21/2018    Hypoglycemia The current episode started more than 1 month ago. The problem has been resolved. Pertinent negatives include no arthralgias, chest pain, chills, coughing, fatigue, fever, headaches, myalgias, nausea, rash, vomiting or weakness. She has tried nothing for the symptoms.  Hyperlipidemia This is a chronic problem. The current episode started more than 1 year ago. The problem is uncontrolled. Pertinent negatives include no chest pain, myalgias or shortness of breath. She is currently on no antihyperlipidemic treatment. Risk factors for coronary artery disease include dyslipidemia, hypertension, family history, obesity, a sedentary lifestyle and post-menopausal.  Hypertension This is a chronic problem. The current episode started more than 1 year ago. Pertinent negatives include no chest pain, headaches, palpitations or shortness of breath. Risk factors for coronary artery disease include dyslipidemia, obesity, sedentary lifestyle and  family history. Past treatments include diuretics. Identifiable causes of hypertension include a thyroid problem.  Thyroid Problem Presents for initial visit. The condition has lasted for 20 years. Patient reports no cold intolerance, diarrhea, fatigue, heat intolerance or palpitations. Past treatments include levothyroxine. The following procedures have not been performed: thyroidectomy. Her past medical history is significant for hyperlipidemia.     Review of Systems  Constitutional:  Negative for chills, fatigue, fever and unexpected weight change.       She weighed 290 pounds prior to her Roux-en-Y gastric bypass in 2007.  Subsequently she lost 60s+ pounds, however more recently she is gained almost all of the weight back to  288 pounds during her last visit, returning with 7 pounds of weight loss.     HENT:  Negative for trouble swallowing and voice change.   Eyes:  Negative for visual disturbance.  Respiratory:  Negative for cough, shortness of breath and wheezing.   Cardiovascular:  Negative for chest pain, palpitations and leg swelling.  Gastrointestinal:  Negative for diarrhea, nausea and vomiting.  Endocrine: Negative for cold intolerance, heat intolerance, polydipsia, polyphagia and polyuria.  Musculoskeletal:  Negative for arthralgias and myalgias.  Skin:  Negative for color change, pallor, rash and wound.  Neurological:  Negative for seizures, weakness and headaches.  Psychiatric/Behavioral:  Negative for confusion and suicidal ideas.     Objective:       03/09/2022    3:54 PM 02/16/2022    1:28 PM 02/07/2022    1:16 PM  Vitals with BMI  Height 5' 6.5" 5' 6.5"   Weight 281 lbs 10 oz 288 lbs 13 oz 289 lbs  BMI 44.78 81.19 14.78  Systolic 295 621 308  Diastolic 86 82 74  Pulse 72 68 72    BP (!) 142/86   Pulse 72   Ht 5' 6.5" (1.689 m)   Wt 281 lb 9.6 oz (127.7 kg)   BMI 44.77 kg/m   Wt Readings from Last 3 Encounters:  03/09/22 281 lb 9.6 oz (  127.7 kg)  02/16/22  288 lb 12.8 oz (131 kg)  02/07/22 289 lb (131.1 kg)       CMP ( most recent) CMP     Component Value Date/Time   NA 141 09/09/2021 0833   NA 138 11/09/2013 1156   K 4.2 09/09/2021 0833   K 3.9 11/09/2013 1156   CL 107 (H) 09/09/2021 0833   CL 106 11/09/2013 1156   CO2 22 11/13/2020 1156   CO2 26 11/09/2013 1156   GLUCOSE 97 09/09/2021 0833   GLUCOSE 89 07/13/2020 1115   GLUCOSE 100 (H) 11/09/2013 1156   BUN 19 09/09/2021 0833   BUN 16 11/09/2013 1156   CREATININE 0.69 09/09/2021 0833   CREATININE 0.65 11/09/2013 1156   CALCIUM 9.0 09/09/2021 0833   CALCIUM 8.8 11/09/2013 1156   PROT 6.4 09/09/2021 0833   ALBUMIN 4.2 09/09/2021 0833   AST 14 09/09/2021 0833   ALT 12 09/09/2021 0833   ALKPHOS 119 09/09/2021 0833   BILITOT 0.3 09/09/2021 0833   GFRNONAA 96 08/28/2020 0945   GFRNONAA >60 07/13/2020 1115   GFRNONAA >60 11/09/2013 1156   GFRAA 111 08/28/2020 0945   GFRAA >60 11/09/2013 1156     Diabetic Labs (most recent): Lab Results  Component Value Date   HGBA1C 6.3 (H) 02/03/2022   HGBA1C 6.0 (H) 01/08/2020     Lipid Panel ( most recent) Lipid Panel     Component Value Date/Time   CHOL 245 (H) 09/09/2021 0833   TRIG 99 09/09/2021 0833   HDL 73 09/09/2021 0833   CHOLHDL 3.4 09/09/2021 0833   LDLCALC 155 (H) 09/09/2021 0833   LABVLDL 17 09/09/2021 0833      Lab Results  Component Value Date   TSH 3.900 09/09/2021   TSH 3.450 08/28/2020   TSH 3.770 01/08/2020   TSH 4.610 (H) 10/01/2019   TSH 2.960 02/28/2019   TSH 5.870 (H) 12/21/2018      Assessment & Plan:   1. Reactive hypoglycemia-resolved 2. Prediabetes-  3. Mixed hyperlipidemia 4. Hypothyroidism, unspecified type I have reviewed her Dexcom CGM profile.  She returns with average blood glucose of 123.  She is 98% time range, 1% higher than target range.  0% hypoglycemia. Reactive hypoglycemia resolved.   she weighed as high as 290 pounds prior to her Roux-en-Y surgery in 2007 which  allowed her to lose 66 pounds before she started to regain most of that weight back to  288 pounds during her last visit.  She lost 7 pounds since then. - I had a long discussion with her about possible causes of hypoglycemia in the background history of bariatric surgery.   -She most likely has bariatric surgery associated hypoglycemia versus reactive hypoglycemia. -Treatment is dietary modification.  She has engaged with whole food plant-based diet recommended during her last visit and she is recommended to stay on course. -In light of her metabolic dysfunction indicated by obesity, hyperlipidemia, prediabetes, hypertension, sedentary life, she is not extremely high risk for type 2 diabetes and associated comorbidities/complications.    These are all discussed in detail with her. She remains  a good candidate for lifestyle medicine.  - she acknowledges that there is a room for improvement in her food and drink choices.  She is advised to engage in lifestyle medicine at a slower pace than what  she has done since last visit.   - Suggestion is made for her to avoid simple carbohydrates  from her diet including Cakes, Sweet Desserts,  Ice Cream, Soda (diet and regular), Sweet Tea, Candies, Chips, Cookies, Store Bought Juices, Alcohol , Artificial Sweeteners,  Coffee Creamer, and "Sugar-free" Products, Lemonade. This will help patient to have more stable blood glucose profile and potentially avoid unintended weight gain.  The following Lifestyle Medicine recommendations according to [facility]) were discussed and and offered to patient and she  agrees to start the journey:  A. Whole Foods, Plant-Based Nutrition comprising of fruits and vegetables, plant-based proteins, whole-grain carbohydrates was discussed in detail with the patient.   A list for source of those nutrients were also provided to the patient.  Patient will use only water or unsweetened tea for  hydration. B.  The need to stay away from risky substances including alcohol, smoking; obtaining 7 to 9 hours of restorative sleep, at least 150 minutes of moderate intensity exercise weekly, the importance of healthy social connections,  and stress management techniques were discussed. C.  A full color page of  Calorie density of various food groups per pound showing examples of each food groups was provided to the patient.    - I have approached her with the following plan to manage  her diabetes and patient agrees:   -She will continue to benefit from a CGM.  She is advised to continue to wear the Dexcom CGM continuously.   - Specific targets for  A1c;  LDL, HDL,  and Triglycerides were discussed with the patient.  2) Blood Pressure /Hypertension:  her blood pressure is not  controlled to target.   she is advised to resume her hydrochlorothiazide 25 mg p.o. daily at breakfast.    3) Lipids/Hyperlipidemia:  She has uncontrolled lipid panel LDL 155. Lifestyle  Medicine will be given a chance until next measurement in 3 months.  If she continues to engage with whole food  plant-based diet , it will lower her cholesterol to optimal levels.  If she has problems engaging, she will be considered for statin intervention.   4)  Weight/Diet:  Body mass index is 44.77 kg/m.  -   clearly complicating her diabetes care.   she is  a candidate for weight loss. I discussed with her the fact that loss of 5 - 10% of her  current body weight will have the most impact on her diabetes management.  The above detailed  [facility] recommendations for nutrition, exercise, sleep, social life, avoidance of risky substances, the need for restorative sleep   information will also detailed on discharge instructions.  5) hypothyroidism : the circumstance of her diagnosis is not available to review.  She is currently on levothyroxine 100 mcg p.o. daily before breakfast.  Her recent labs are consistent with appropriate  replacement.  - We discussed about the correct intake of her thyroid hormone, on empty stomach at fasting, with water, separated by at least 30 minutes from breakfast and other medications,  and separated by more than 4 hours from calcium, iron, multivitamins, acid reflux medications (PPIs). -Patient is made aware of the fact that thyroid hormone replacement is needed for life, dose to be adjusted by periodic monitoring of thyroid function tests.   6) Chronic Care/Health Maintenance:  -she is advised to stay away from smoking. I have recommended yearly flu vaccine and pneumonia vaccine at least every 5 years; moderate intensity exercise for up to 150 minutes weekly; and  sleep for 7- 9 hours a day.  - she is  advised to maintain close follow up with  Adin Hector, MD for primary care needs, as well as her other providers for optimal and coordinated care.    I spent 40 minutes in the care of the patient today including review of labs from [place], Lipids, Thyroid Function, Hematology (current and previous including abstractions from other facilities); face-to-face time discussing  her blood glucose readings/logs, discussing hypoglycemia and hyperglycemia episodes and symptoms, medications doses, her options of short and long term treatment based on the latest standards of care / guidelines;  discussion about incorporating lifestyle medicine;  and documenting the encounter. Risk reduction counseling performed per USPSTF guidelines to reduce  obesity and cardiovascular risk factors.     Please refer to Patient Instructions for Blood Glucose Monitoring and Insulin/Medications Dosing Guide"  in media tab for additional information. Please  also refer to " Patient Self Inventory" in the Media  tab for reviewed elements of pertinent patient history.  Kearney Hard participated in the discussions, expressed understanding, and voiced agreement with the above plans.  All questions were answered to her  satisfaction. she is encouraged to contact clinic should she have any questions or concerns prior to her return visit.    Follow up plan: - Return in about 3 months (around 06/09/2022) for F/U with Pre-visit Labs, Meter/CGM/Logs, A1c here.  Glade Lloyd, MD [facility] [place] [address] [place], [place] [address] Phone: [phone]  Fax: [phone]    03/09/2022, 4:27 PM  This note was partially dictated with voice recognition software. Similar sounding words can be transcribed inadequately or may not  be corrected upon review.

## 2022-03-22 ENCOUNTER — Ambulatory Visit: Payer: 59 | Admitting: "Endocrinology

## 2022-04-13 ENCOUNTER — Ambulatory Visit: Payer: Self-pay | Admitting: Physician Assistant

## 2022-04-13 ENCOUNTER — Encounter: Payer: Self-pay | Admitting: Physician Assistant

## 2022-04-13 VITALS — BP 143/87 | HR 82 | Temp 97.3°F | Resp 12

## 2022-04-13 DIAGNOSIS — H00012 Hordeolum externum right lower eyelid: Secondary | ICD-10-CM

## 2022-04-13 MED ORDER — GENTAMICIN SULFATE 0.3 % OP SOLN: 1.0000 [drp] | OPHTHALMIC | 0 refills | Status: DC

## 2022-04-13 NOTE — Progress Notes (Signed)
Woke up yesterday morning with right eye itching & tender to the touch.  States she felt a little knot in it.    Using warm compresses & OTC Stye  Bactoban  States that it feels like it's getting bigger.  AMD

## 2022-04-13 NOTE — Progress Notes (Signed)
   Subjective: Stye    Patient ID: Mary [name], female    DOB: 03/07/1961, 62 y.o.   MRN: [id number]  HPI Patient complaining of a palpable lesion on erythematous lower right eyelid for 2 days.  Denies vision disturbance.  Patient has apply warm compresses to area with no noticeable relief.   Review of Systems Hypertension, hypothyroidism, and obstructive sleep apnea.    Objective:   Physical Exam BP is 148/87, pulse 82, respiration 12, temperature 97.3, patient 95% O2 sat on room air. Visual acuity not obtained. Erythematous right lower eyelid with a papular lesion nasal aspect of eyelid.  Eyes PERRL and EOM's intact.  Conjunctiva is not erythematous.      Assessment & Plan: Stye right lower eyelid   Patient given discharge care instructions consistent continue warm compresses to right lower eyelid.  Patient given prescription for gentamicin eyedrops in 5 days if no improvement or worsening complaint.

## 2022-04-20 ENCOUNTER — Ambulatory Visit: Payer: 59 | Admitting: Physician Assistant

## 2022-04-20 ENCOUNTER — Other Ambulatory Visit: Payer: Self-pay

## 2022-04-21 ENCOUNTER — Encounter: Payer: Self-pay | Admitting: Physician Assistant

## 2022-04-21 ENCOUNTER — Ambulatory Visit: Payer: Self-pay | Admitting: Physician Assistant

## 2022-04-21 VITALS — BP 165/81 | HR 66 | Temp 97.3°F | Resp 12

## 2022-04-21 DIAGNOSIS — H00012 Hordeolum externum right lower eyelid: Secondary | ICD-10-CM

## 2022-04-21 NOTE — Progress Notes (Signed)
States feels like the stye hasn't gone down since her last visit. Hasn't completed the ABX. States it looks worse 1st thing when she awakes.  AMD

## 2022-04-21 NOTE — Progress Notes (Signed)
   Subjective: Stye    Patient ID: Mary [name], female    DOB: 1960-11-22, 61 y.o.   MRN: [id number]  HPI Patient follow-up for papular lesion external more right eyelid.  Patient was seen 8 days ago and advised conservative treatment consist of warm compresses and antibiotic eyedrops.  Patient stated no change in size of lesion.  Denies vision disturbance.  Denies pain.   Review of Systems     Objective:   Physical Exam  BP is 165/81, pulse 66, respiration 12, temperature 97.3, patient 90% O2 sat on room air. Papular lesion on not erythematous base of the right lower eyelid.      Assessment & Plan: Stye  Advised continue conservative treatment since there has been no change in size.  We will follow-up on 04/25/2022.  We will consider consult to ophthalmologist if no improvement.

## 2022-05-05 ENCOUNTER — Encounter: Payer: Self-pay | Admitting: Dietician

## 2022-05-05 ENCOUNTER — Encounter: Payer: 59 | Attending: "Endocrinology | Admitting: Dietician

## 2022-05-05 VITALS — Ht 66.5 in | Wt 280.1 lb

## 2022-05-05 DIAGNOSIS — R7303 Prediabetes: Secondary | ICD-10-CM | POA: Insufficient documentation

## 2022-05-05 DIAGNOSIS — Z713 Dietary counseling and surveillance: Secondary | ICD-10-CM | POA: Insufficient documentation

## 2022-05-05 DIAGNOSIS — Z9884 Bariatric surgery status: Secondary | ICD-10-CM

## 2022-05-05 DIAGNOSIS — Z6841 Body Mass Index (BMI) 40.0 and over, adult: Secondary | ICD-10-CM

## 2022-05-05 NOTE — Patient Instructions (Signed)
Include protein, veg or fruit, small portion of grain or starch with each meal. Start with the protein food in case of feeling full before eating other foods. Continue to keep fat and sugar low in foods, great job! Keep working to include physical activity daily to keep up metabolism (calorie burning) Plan to eat something every 2-4 hours during the day. Part of a protein drink is ok as a snack/ between meal nourishment.

## 2022-05-05 NOTE — Progress Notes (Signed)
Medical Nutrition Therapy: Visit start time: 0825  end time: 0940  Assessment:   Referral Diagnosis: prediabetes Other medical history/ diagnoses: hypothyroidism, knee replacement surgery, shoulder surgery, history of RnY gastric bypass surgery Psychosocial issues/ stress concerns: none  Medications, supplements: reconciled list in medical record   Preferred learning method:  Hands-on    Current weight: 280.1lbs Height: 5'6.5"  BMI: 44.53 Patient's personal weight goal: 200lbs  Progress and evaluation:  Patient reports BG results in normal range since wearing Dexcom monitor, and some episodes of low BG, in 60s and occasionally 50s during the night, but often improves even without treatment. She does drink a few sips of low sugar juice and then peanut butter on bread if BG is low during the day. Followed vegan diet for about 6 weeks on MD advice but was unable to maintain, added some eggs, almond milk. She has maintained some changes including lower carbs, increased vegetable and fruit intake History of RYGB 2007, has some B12 deficiency and takes injections, vitamin D supplement but no multivitamin. She is unable to eat more than 1/4 - 1/2c portions of foods at a time, and has nausea with any concentrated sources of sugar. She avoids fluids during meals.  Multiple surgeries in past 2-3 years have affected activity and diet.  Lab results on 02/03/22 indicate HbA1C of 6.3% Food sensitivities: lactose intolerance Special diet practices: none Patient seeks help with sustainable eating pattern to promote weight loss and prevent diabetes Next medical appt is 06/09/22   Dietary Intake:  Usual eating pattern includes 3 meals and 1 snacks per day. Dining out frequency: 5-8 meals per week. Who plans meals/ buys groceries? Self, spouse Who prepares meals? Self, spouse  Breakfast: apple +1 egg + extra egg white on keto tortilla with sausage, bacon, or cheese,  coffee Snack: banana; overnight  oats with nuts/ pb + fruit, sometimes none Lunch: apple + high fiber tortilla pizza; apple + keto bread grilled cheese + tomato soup; salad with chicken/ chicken alt Snack: none Supper: fish ie cod cooked in air fryer; occ lean red meat with mushrooms + potato + veg brocc/ squash and zucchini; beans + potato + veg Snack: none Beverages: some water (avoiding flavored water and consequently drinking less), coffee with stevia  Physical activity: walking by increasing steps daily, currently 1- 1.5 miles daily   Intervention:   Nutrition Care Education:   Basic nutrition: appropriate nutrient balance; appropriate meal and snack schedule; general nutrition guidelines    Weight control: importance of low sugar and low fat choices; reviewed food portions; estimated energy needs for weight loss at 1200-1300 kcal, provided guidance for 45% CHO, 30% pro, (25% fat); tracking food intake; role of physical activity prediabetes:  appropriate meal and snack schedule; appropriate carb intake and balance, healthy carb choices; role of fiber, protein, fat; physical activity; instructed on appropriate treatment for low BGs. Other:  gastric bypass/ bariatric diet -- starting meals with protein source followed by low carb veg or fruit, followed by grain/ starch; need for lifelong multivitamin supplementation  Other intervention notes: Patient has made significant diet changes in the past several months; she is motivated to continue but not able to follow vegan eating pattern. Advised lean proteins, mostly poultry and fish, low fat or fat free dairy, and continuation of some meatless meals Established additional nutrition goals with input from patient   Nutritional Diagnosis:  [place]-2.1 Inpaired nutrition utilization and [place]-2.2 Altered nutrition-related laboratory As related to history of gastric bypass surgery, prediabetes.  As evidenced by small food portions, limited tolerance of sugar; prediabetes evidenced by  elevated HbA1C. [place]-3.3 Overweight/obesity As related to history of dieting and weight loss surgery, limited physical activity.  As evidenced by patient with current BMI of 44.53, making diet and lifestyle changes to promote weight loss and improve diabetes risk.   Education Materials given:  Museum/gallery conservator with food lists, sample meal pattern Sample bariatric  menus Visit summary with goals/ instructions   Learner/ who was taught:  Patient  Spouse/ partner   Level of understanding: Verbalizes/ demonstrates competency   Demonstrated degree of understanding via:   Teach back Learning barriers: None  Willingness to learn/ readiness for change: Eager, change in progress   Monitoring and Evaluation:  Dietary intake, exercise, BG control, and body weight      follow up:  06/15/22 at 5:00pm

## 2022-05-09 ENCOUNTER — Encounter (INDEPENDENT_AMBULATORY_CARE_PROVIDER_SITE_OTHER): Payer: Self-pay

## 2022-05-09 ENCOUNTER — Other Ambulatory Visit: Payer: Self-pay | Admitting: Physician Assistant

## 2022-05-09 DIAGNOSIS — E079 Disorder of thyroid, unspecified: Secondary | ICD-10-CM

## 2022-05-23 ENCOUNTER — Other Ambulatory Visit: Payer: Self-pay | Admitting: Internal Medicine

## 2022-05-23 DIAGNOSIS — Z1231 Encounter for screening mammogram for malignant neoplasm of breast: Secondary | ICD-10-CM

## 2022-05-30 ENCOUNTER — Ambulatory Visit: Payer: Self-pay | Admitting: Physician Assistant

## 2022-05-30 ENCOUNTER — Encounter: Payer: Self-pay | Admitting: Physician Assistant

## 2022-05-30 ENCOUNTER — Other Ambulatory Visit: Payer: Self-pay | Admitting: Physician Assistant

## 2022-05-30 VITALS — BP 96/43 | HR 76 | Temp 98.2°F | Resp 16

## 2022-05-30 DIAGNOSIS — R051 Acute cough: Secondary | ICD-10-CM

## 2022-05-30 DIAGNOSIS — R509 Fever, unspecified: Secondary | ICD-10-CM

## 2022-05-30 LAB — POCT INFLUENZA A/B
Influenza A, POC: NEGATIVE
Influenza B, POC: NEGATIVE

## 2022-05-30 LAB — POC COVID19 BINAXNOW: SARS Coronavirus 2 Ag: NEGATIVE

## 2022-05-30 MED ORDER — HYDROCOD POLI-CHLORPHE POLI ER 10-8 MG/5ML PO SUER: 5.0000 mL | Freq: Two times a day (BID) | ORAL | 0 refills | Status: DC | PRN

## 2022-05-30 MED ORDER — AZITHROMYCIN 250 MG PO TABS: ORAL_TABLET | ORAL | 0 refills | Status: AC

## 2022-05-30 NOTE — Progress Notes (Signed)
Stated coughing productive in last 6days, but now just dry nagging cough x 2 days worse at night while laying down.  Using Nyquil/Dayquil with only some relief and reports hx bronchitis.  Tested negative prior to visit for flu and covid.  No dyspnea noted at visit after ambulation.  Taking PO well.  No GI upset.

## 2022-05-30 NOTE — Progress Notes (Signed)
   Subjective: Cough    Patient ID: Mary [name], female    DOB: Dec 03, 1960, 61 y.o.   MRN: [id number]  HPI Patient complains of 6 days of productive cough in the last 2 days there is a nonproductive cough.  Patient states cough increase with laying down in which she experiencing wheezing.  Patient tested negative for influenza and COVID-19 today.  Patient has a history of bronchitis.   Review of Systems Hypertension and hypothyroidism.  Sleep apnea.    Objective:   Physical Exam  BP is 96/43, pulse 76, respirations 16, temperature 98.2, and patient is 95% O2 sat on room air. HEENT is remarkable postnasal drainage.  Neck is supple without lymphadenopathy or bruits.  Lungs have expiratory Rales.      Assessment & Plan: Bronchitis   Patient given a prescription for Z-Pak and Tussionex.  Patient advised to follow-up in 2 days telephonically if worsening or no improvement.  Advised and addressed effects of the cough medication.

## 2022-06-09 ENCOUNTER — Ambulatory Visit: Payer: 59 | Admitting: "Endocrinology

## 2022-06-11 NOTE — Progress Notes (Signed)
MRN : [id number]  Mary [name] is a 61 y.o. ([birth year]) female who presents with chief complaint of legs swell.  History of Present Illness:   The patient returns to the office for followup evaluation regarding leg swelling.  The swelling has persisted and the pain associated with swelling continues. There have not been any interval development of a ulcerations or wounds.   Since the previous visit the patient has been trying to wear graduated compression stockings and has noted little if any improvement in the lymphedema. This is primarily because the shape of her leg is now making it difficult to get a proper fit. The patient has been using compression routinely morning until night as best she can.  She is also having problems with her lymphedema pump for the same reason.   The patient also states elevation during the day and exercise is being done too.    Previous duplex ultrasound of the venous system was obtained.  It shows reflux within the femoral vein bilaterally.  There is also reflux at the saphenofemoral junction bilaterally.  The superficial reflux appears to be predominantly extending into the anterior lateral saphenous vein as opposed to the saphenous vein proper bilaterally.  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    Past Medical History:  Diagnosis Date   Anemia    low iron at times   Arthritis    Colon polyps    Complication of anesthesia 1991   Pt reports she awoke during plantar fascia surgery and felt "everything"   Heart murmur    " small per patient"    Hyperlipidemia    Hypertension    Hypothyroidism    Lactose intolerance    PAC (premature atrial contraction)    Palpitations    Panic attacks    Pt reports restrictive clothing/areas and dry mouth cause her to have attack.   Peripheral  vascular disease ([place])    "small" clot after thermal ablasion   Pneumonia 2016   "walking pneumonia" 3 times in 2016   Shortness of breath    going upstairs (due to weight). not so much since weight loss   Sleep apnea    after weight loss does not have to use cpap   Vitamin B12 deficiency    Vitamin D deficiency     Past Surgical History:  Procedure Laterality Date   [place]   twins   COLONOSCOPY     COLONOSCOPY WITH PROPOFOL N/A 02/27/2018   Procedure: COLONOSCOPY WITH PROPOFOL;  Surgeon: [place], Benay Pike, MD;  Location: [facility];  Service: Gastroenterology;  Laterality: N/A;   IRRIGATION AND DEBRIDEMENT HEMATOMA Right 11/12/2015   Procedure: IRRIGATION AND DEBRIDEMENT HEMATOMA;  Surgeon: Robert Bellow, MD;  Location: [facility];  Service: General;  Laterality: Right;   KNEE ARTHROSCOPY WITH MEDIAL MENISECTOMY Left 07/15/2020   Procedure: Left knee arthroscopy with partial medial or lateral menisectomy, possible chondroplasty, possible partial synovectomy;  Surgeon: Lovell Sheehan, MD;  Location: [facility];  Service: Orthopedics;  Laterality: Left;   LAMINECTOMY  07/10/2013   L4   L5   DR Ronnald Ramp   LAPAROSCOPIC SALPINGO OOPHERECTOMY Right 04/17/2014   Procedure: LAPAROSCOPIC SALPINGO OOPHORECTOMY RIGHT;  Surgeon: Everitt Amber, MD;  Location: [facility];  Service: Gynecology;  Laterality: Right;  LYSIS OF ADHESION  04/17/2014   Procedure: LYSIS OF ADHESION;  Surgeon: Everitt Amber, MD;  Location: [facility];  Service: Gynecology;;   MAXIMUM ACCESS (MAS)POSTERIOR LUMBAR INTERBODY FUSION (PLIF) 1 LEVEL  07/10/2013   Procedure: FOR MAXIMUM ACCESS (MAS) POSTERIOR LUMBAR INTERBODY FUSION (PLIF) LUMBAR FOUR-FIVE;  Surgeon: Eustace Moore, MD;  Location: [facility];  Service: Neurosurgery;;  FOR MAXIMUM ACCESS (MAS) POSTERIOR LUMBAR INTERBODY FUSION (PLIF) LUMBAR FOUR-FIVE   OOPHORECTOMY     LSO   OPEN REDUCTION INTERNAL FIXATION (ORIF) DISTAL RADIAL FRACTURE Left  09/20/2018   Procedure: OPEN REDUCTION INTERNAL FIXATION (ORIF) DISTAL RADIAL FRACTURE, LEFT;  Surgeon: Hessie Knows, MD;  Location: [facility];  Service: Orthopedics;  Laterality: Left;   [place]   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   VAGINAL HYSTERECTOMY  2001   for bleeding and LSO for cyst    Social History Social History   Tobacco Use   Smoking status: Former    Packs/day: 0.30    Years: 4.00    Total pack years: 1.20    Types: Cigarettes    Quit date: 07/11/2002    Years since quitting: 19.9   Smokeless tobacco: Never  Vaping Use   Vaping Use: Never used  Substance Use Topics   Alcohol use: Not Currently    Alcohol/week: 1.0 standard drink of alcohol    Types: 1 Standard drinks or equivalent per week   Drug use: No    Family History Family History  Problem Relation Age of Onset   Heart disease Mother    Lung cancer Mother    Diabetes Mother        later in life   Coronary artery disease Mother    Osteoporosis Mother    Diabetes Father    Heart disease Father    Arrhythmia Sister    Breast cancer Sister 62       diagnosed 09/09/2016   Colonic polyp Brother        pre cancer    Allergies  Allergen Reactions   Bee Venom Anaphylaxis   Citalopram Nausea Only   Lactose Intolerance (Gi) Diarrhea    Bloating, upset stomach   Other    Tape Other (See Comments)    Thin skin, rips off skin     REVIEW OF SYSTEMS (Negative unless checked)  Constitutional: '[]'$ Weight loss  '[]'$ Fever  '[]'$ Chills Cardiac: '[]'$ Chest pain   '[]'$ Chest pressure   '[]'$ Palpitations   '[]'$ Shortness of breath when laying flat   '[]'$ Shortness of breath with exertion. Vascular:  '[]'$ Pain in legs with walking   '[x]'$ Pain in legs with standing  '[]'$ History of DVT   '[]'$ Phlebitis   '[x]'$ Swelling in legs   '[]'$ Varicose veins   '[]'$ Non-healing ulcers Pulmonary:   '[]'$ Uses home oxygen   '[]'$ Productive cough   '[]'$ Hemoptysis   '[]'$ Wheeze  '[]'$ COPD   '[]'$ Asthma Neurologic:  '[]'$ Dizziness   '[]'$ Seizures   '[]'$ History of stroke    '[]'$ History of TIA  '[]'$ Aphasia   '[]'$ Vissual changes   '[]'$ Weakness or numbness in arm   '[]'$ Weakness or numbness in leg Musculoskeletal:   '[]'$ Joint swelling   '[]'$ Joint pain   '[]'$ Low back pain Hematologic:  '[]'$ Easy bruising  '[]'$ Easy bleeding   '[]'$ Hypercoagulable state   '[]'$ Anemic Gastrointestinal:  '[]'$ Diarrhea   '[]'$ Vomiting  '[]'$ Gastroesophageal reflux/heartburn   '[]'$ Difficulty swallowing. Genitourinary:  '[]'$ Chronic kidney disease   '[]'$ Difficult urination  '[]'$ Frequent urination   '[]'$ Blood in urine Skin:  '[]'$ Rashes   '[]'$ Ulcers  Psychological:  '[]'$ History of anxiety   '[]'$   History of major depression.  Physical Examination  There were no vitals filed for this visit. There is no height or weight on file to calculate BMI. Gen: WD/WN, NAD Head: [place]/AT, No temporalis wasting.  Ear/Nose/Throat: Hearing grossly intact, nares w/o erythema or drainage, pinna without lesions Eyes: PER, EOMI, sclera nonicteric.  Neck: Supple, no gross masses.  No JVD.  Pulmonary:  Good air movement, no audible wheezing, no use of accessory muscles.  Cardiac: RRR, precordium not hyperdynamic. Vascular:  scattered varicosities present bilaterally.  Mild venous stasis changes to the legs bilaterally.  3-4+ soft pitting edema, particularly just above the left knee which then hangs down when she stand creating a large crease.  CEAP C4sEpAsPr  Vessel Right Left  Radial Palpable Palpable  Gastrointestinal: soft, non-distended. No guarding/no peritoneal signs.  Musculoskeletal: M/S 5/5 throughout.  No deformity.  Neurologic: CN 2-12 intact. Pain and light touch intact in extremities.  Symmetrical.  Speech is fluent. Motor exam as listed above. Psychiatric: Judgment intact, Mood & affect appropriate for pt's clinical situation. Dermatologic: Venous rashes no ulcers noted.  No changes consistent with cellulitis. Lymph : No lichenification or skin changes of chronic lymphedema.  CBC Lab Results  Component Value Date   WBC 5.0 09/09/2021   HGB 13.3  09/09/2021   HCT 39.0 09/09/2021   MCV 81 09/09/2021   PLT 187 09/09/2021    BMET    Component Value Date/Time   NA 141 09/09/2021 0833   NA 138 11/09/2013 1156   K 4.2 09/09/2021 0833   K 3.9 11/09/2013 1156   CL 107 (H) 09/09/2021 0833   CL 106 11/09/2013 1156   CO2 22 11/13/2020 1156   CO2 26 11/09/2013 1156   GLUCOSE 97 09/09/2021 0833   GLUCOSE 89 07/13/2020 1115   GLUCOSE 100 (H) 11/09/2013 1156   BUN 19 09/09/2021 0833   BUN 16 11/09/2013 1156   CREATININE 0.69 09/09/2021 0833   CREATININE 0.65 11/09/2013 1156   CALCIUM 9.0 09/09/2021 0833   CALCIUM 8.8 11/09/2013 1156   GFRNONAA 96 08/28/2020 0945   GFRNONAA >60 07/13/2020 1115   GFRNONAA >60 11/09/2013 1156   GFRAA 111 08/28/2020 0945   GFRAA >60 11/09/2013 1156   CrCl cannot be calculated (Patient's most recent lab result is older than the maximum 21 days allowed.).  COAG Lab Results  Component Value Date   INR 0.97 07/01/2013    Radiology No results found.   Assessment/Plan 1. Lymphedema Recommend:  No surgery or intervention at this point in time.   The Patient is CEAP C4sEpAsPr.  The patient has been wearing compression for more than 12 weeks with no or little benefit.  The patient has been exercising daily for more than 12 weeks. The patient has been elevating and taking OTC pain medications for more than 12 weeks.  None of these have have eliminated the pain related to the lymphedema or the discomfort regarding excessive swelling and venous congestion.    I will discuss with Dr Marla Roe what options may exist for the problematic area about the knee.  I have reviewed my discussion with the patient regarding lymphedema and why it  causes symptoms.  Patient will continue wearing graduated compression on a daily basis. The patient should put the compression on first thing in the morning and removing them in the evening. The patient should not sleep in the compression.   In addition, behavioral  modification throughout the day will be continued.  This will include frequent  elevation (such as in a recliner), use of over the counter pain medications as needed and exercise such as walking.  The systemic causes for chronic edema such as liver, kidney and cardiac etiologies do not appear to have significant changed over the past year.    Despite conservative treatments including graduated compression therapy class 1 and behavioral modification including exercise and elevation the patient  has not obtained adequate control of the lymphedema.  The patient still has stage 3 lymphedema and therefore, I believe that a lymph pump is necessary to improve the control of the patient's lymphedema.  Additionally, a lymph pump is warranted because it will reduce the risk of cellulitis and ulceration in the future.  However, the sleeves that she has no longer seem to be well fitted and I will see if we can get her new sleeves.  Patient should follow-up in six months   - Ambulatory referral to Plastic Surgery  2. Chronic venous insufficiency See #1  3. Essential hypertension Continue antihypertensive medications as already ordered, these medications have been reviewed and there are no changes at this time.  4. Arthritis Continue NSAID medications as already ordered, these medications have been reviewed and there are no changes at this time.  Continued activity and therapy was stressed.    Hortencia Pilar, MD  06/11/2022 3:39 PM

## 2022-06-13 ENCOUNTER — Encounter (INDEPENDENT_AMBULATORY_CARE_PROVIDER_SITE_OTHER): Payer: Self-pay | Admitting: Vascular Surgery

## 2022-06-13 ENCOUNTER — Ambulatory Visit (INDEPENDENT_AMBULATORY_CARE_PROVIDER_SITE_OTHER): Payer: 59 | Admitting: Vascular Surgery

## 2022-06-13 VITALS — Ht 61.0 in | Wt 283.0 lb

## 2022-06-13 DIAGNOSIS — M1990 Unspecified osteoarthritis, unspecified site: Secondary | ICD-10-CM

## 2022-06-13 DIAGNOSIS — I890 Lymphedema, not elsewhere classified: Secondary | ICD-10-CM

## 2022-06-13 DIAGNOSIS — I872 Venous insufficiency (chronic) (peripheral): Secondary | ICD-10-CM | POA: Diagnosis not present

## 2022-06-13 DIAGNOSIS — I10 Essential (primary) hypertension: Secondary | ICD-10-CM | POA: Diagnosis not present

## 2022-06-15 ENCOUNTER — Ambulatory Visit: Payer: 59 | Admitting: Dietician

## 2022-06-16 ENCOUNTER — Other Ambulatory Visit: Payer: Self-pay

## 2022-06-16 DIAGNOSIS — E538 Deficiency of other specified B group vitamins: Secondary | ICD-10-CM

## 2022-06-16 DIAGNOSIS — E559 Vitamin D deficiency, unspecified: Secondary | ICD-10-CM

## 2022-06-16 DIAGNOSIS — E782 Mixed hyperlipidemia: Secondary | ICD-10-CM

## 2022-06-16 DIAGNOSIS — E038 Other specified hypothyroidism: Secondary | ICD-10-CM

## 2022-06-16 DIAGNOSIS — E162 Hypoglycemia, unspecified: Secondary | ICD-10-CM

## 2022-06-16 NOTE — Progress Notes (Signed)
Pt presents today to complete labs for endocrinology. Mary [name]

## 2022-06-17 LAB — CMP12+LP+TP+TSH+6AC+CBC/D/PLT
ALT: 14 IU/L (ref 0–32)
AST: 14 IU/L (ref 0–40)
Albumin/Globulin Ratio: 1.8 (ref 1.2–2.2)
Albumin: 4 g/dL (ref 3.9–4.9)
Alkaline Phosphatase: 119 IU/L (ref 44–121)
BUN/Creatinine Ratio: 18 (ref 12–28)
BUN: 12 mg/dL (ref 8–27)
Basophils Absolute: 0.1 10*3/uL (ref 0.0–0.2)
Basos: 1 %
Bilirubin Total: 0.3 mg/dL (ref 0.0–1.2)
Calcium: 9.1 mg/dL (ref 8.7–10.3)
Chloride: 105 mmol/L (ref 96–106)
Chol/HDL Ratio: 3.6 ratio (ref 0.0–4.4)
Cholesterol, Total: 271 mg/dL — ABNORMAL HIGH (ref 100–199)
Creatinine, Ser: 0.66 mg/dL (ref 0.57–1.00)
EOS (ABSOLUTE): 0.1 10*3/uL (ref 0.0–0.4)
Eos: 2 %
Estimated CHD Risk: 0.6 times avg. (ref 0.0–1.0)
Free Thyroxine Index: 3 (ref 1.2–4.9)
GGT: 12 IU/L (ref 0–60)
Globulin, Total: 2.2 g/dL (ref 1.5–4.5)
Glucose: 109 mg/dL — ABNORMAL HIGH (ref 70–99)
HDL: 75 mg/dL (ref >39)
Hematocrit: 40.7 % (ref 34.0–46.6)
Hemoglobin: 13.8 g/dL (ref 11.1–15.9)
Immature Grans (Abs): 0 10*3/uL (ref 0.0–0.1)
Immature Granulocytes: 0 %
Iron: 65 ug/dL (ref 27–139)
LDH: 162 IU/L (ref 119–226)
LDL Chol Calc (NIH): 180 mg/dL — ABNORMAL HIGH (ref 0–99)
Lymphocytes Absolute: 1.3 10*3/uL (ref 0.7–3.1)
Lymphs: 28 %
MCH: 27.7 pg (ref 26.6–33.0)
MCHC: 33.9 g/dL (ref 31.5–35.7)
MCV: 82 fL (ref 79–97)
Monocytes Absolute: 0.4 10*3/uL (ref 0.1–0.9)
Monocytes: 8 %
Neutrophils Absolute: 3 10*3/uL (ref 1.4–7.0)
Neutrophils: 61 %
Phosphorus: 3.1 mg/dL (ref 3.0–4.3)
Platelets: 178 10*3/uL (ref 150–450)
Potassium: 4.1 mmol/L (ref 3.5–5.2)
RBC: 4.99 x10E6/uL (ref 3.77–5.28)
RDW: 14.8 % (ref 11.7–15.4)
Sodium: 140 mmol/L (ref 134–144)
T3 Uptake Ratio: 29 % (ref 24–39)
T4, Total: 10.3 ug/dL (ref 4.5–12.0)
TSH: 3.59 u[IU]/mL (ref 0.450–4.500)
Total Protein: 6.2 g/dL (ref 6.0–8.5)
Triglycerides: 93 mg/dL (ref 0–149)
Uric Acid: 4.4 mg/dL (ref 3.0–7.2)
VLDL Cholesterol Cal: 16 mg/dL (ref 5–40)
WBC: 4.8 10*3/uL (ref 3.4–10.8)
eGFR: 100 mL/min/{1.73_m2} (ref >59)

## 2022-06-17 LAB — T4, FREE: Free T4: 1.54 ng/dL (ref 0.82–1.77)

## 2022-06-17 LAB — VITAMIN B12: Vitamin B-12: 524 pg/mL (ref 232–1245)

## 2022-06-17 LAB — VITAMIN D 25 HYDROXY (VIT D DEFICIENCY, FRACTURES): Vit D, 25-Hydroxy: 26.2 ng/mL — ABNORMAL LOW (ref 30.0–100.0)

## 2022-06-17 LAB — HGB A1C W/O EAG: Hgb A1c MFr Bld: 6.4 % — ABNORMAL HIGH (ref 4.8–5.6)

## 2022-06-19 ENCOUNTER — Encounter (INDEPENDENT_AMBULATORY_CARE_PROVIDER_SITE_OTHER): Payer: Self-pay | Admitting: Vascular Surgery

## 2022-06-20 ENCOUNTER — Ambulatory Visit: Payer: Self-pay | Admitting: Physician Assistant

## 2022-06-20 VITALS — BP 163/85 | HR 61 | Resp 14 | Ht 66.5 in | Wt 280.0 lb

## 2022-06-20 DIAGNOSIS — E7801 Familial hypercholesterolemia: Secondary | ICD-10-CM

## 2022-06-20 DIAGNOSIS — E559 Vitamin D deficiency, unspecified: Secondary | ICD-10-CM

## 2022-06-20 MED ORDER — PHENTERMINE HCL 30 MG PO CAPS: 30.0000 mg | ORAL_CAPSULE | ORAL | 0 refills | Status: DC

## 2022-06-20 MED ORDER — SIMVASTATIN 40 MG PO TABS: 40.0000 mg | ORAL_TABLET | Freq: Every day | ORAL | 3 refills | Status: DC

## 2022-06-20 MED ORDER — VITAMIN D (ERGOCALCIFEROL) 1.25 MG (50000 UNIT) PO CAPS: 50000.0000 [IU] | ORAL_CAPSULE | ORAL | 5 refills | Status: DC

## 2022-06-20 NOTE — Progress Notes (Signed)
Pt presents today on concerns with weight loss and follow up from endocrinology. Pt either wants better advice from Randel Pigg, PA-C or different referral to endocrinology. Pt states after fisrt visit with endo she does not agree with vegan diet, she tried for 6 weeks and its not sustainable for the rest of her life.  Pt requiring better advice.

## 2022-06-20 NOTE — Progress Notes (Unsigned)
   Subjective: Obesity    Patient ID: Mary [name], female    DOB: 1961-05-01, 61 y.o.   MRN: [id number]  HPI Patient follow-up from endocrinologist secondary to morbid obesity.  Patient was placed on a Vegan diet.  Patient states she lost approximately 7 pounds on a diet but she cannot sustain the secondary to fatigue and stress.  Patient was told the endocrinologist stated there would be no other option for her at this time his office.   Review of Systems Chronic venous insufficiency, hypertension, hypothyroidism, obstructive sleep apnea, hyperlipidemia, obesity, and vitamin D deficiency.    Objective:   Physical Exam  BP is 163/85, pulse 61, respiration 14, patient 97% O2 sat on room air.  Patient weighs 280 pounds and BMI is 44.52.      Assessment & Plan: Morbid obesity, hyperlipidemia, and vitamin D deficiency.   Patient will consult to endocrinology for reevaluation for weight loss.  Patient given prescription for phentermine 30 mg, simvastatin 40 mg, and vitamin D.  Patient will follow-up in 1 month.

## 2022-06-21 ENCOUNTER — Encounter: Payer: Self-pay | Admitting: Physician Assistant

## 2022-06-28 ENCOUNTER — Ambulatory Visit: Payer: 59 | Admitting: "Endocrinology

## 2022-07-18 ENCOUNTER — Ambulatory Visit
Admission: RE | Admit: 2022-07-18 | Discharge: 2022-07-18 | Disposition: A | Discharge status: Home / Self Care | Payer: 59 | Source: Ambulatory Visit | Attending: Internal Medicine | Admitting: Internal Medicine

## 2022-07-18 DIAGNOSIS — Z1231 Encounter for screening mammogram for malignant neoplasm of breast: Secondary | ICD-10-CM | POA: Insufficient documentation

## 2022-07-21 ENCOUNTER — Encounter: Payer: Self-pay | Admitting: Dietician

## 2022-07-21 NOTE — Progress Notes (Signed)
Encounter opened in error

## 2022-08-05 ENCOUNTER — Encounter: Payer: Self-pay | Admitting: Plastic Surgery

## 2022-08-05 ENCOUNTER — Encounter: Payer: 59 | Attending: "Endocrinology | Admitting: Dietician

## 2022-08-05 ENCOUNTER — Encounter: Payer: Self-pay | Admitting: Dietician

## 2022-08-05 ENCOUNTER — Ambulatory Visit (INDEPENDENT_AMBULATORY_CARE_PROVIDER_SITE_OTHER): Payer: 59 | Admitting: Plastic Surgery

## 2022-08-05 VITALS — Ht 66.5 in | Wt 275.4 lb

## 2022-08-05 VITALS — BP 161/79 | HR 90 | Ht 66.5 in | Wt 275.2 lb

## 2022-08-05 DIAGNOSIS — Z9884 Bariatric surgery status: Secondary | ICD-10-CM

## 2022-08-05 DIAGNOSIS — G4733 Obstructive sleep apnea (adult) (pediatric): Secondary | ICD-10-CM

## 2022-08-05 DIAGNOSIS — Z713 Dietary counseling and surveillance: Secondary | ICD-10-CM | POA: Diagnosis present

## 2022-08-05 DIAGNOSIS — M545 Low back pain, unspecified: Secondary | ICD-10-CM | POA: Diagnosis not present

## 2022-08-05 DIAGNOSIS — I890 Lymphedema, not elsewhere classified: Secondary | ICD-10-CM

## 2022-08-05 DIAGNOSIS — R7303 Prediabetes: Secondary | ICD-10-CM

## 2022-08-05 DIAGNOSIS — Z6841 Body Mass Index (BMI) 40.0 and over, adult: Secondary | ICD-10-CM

## 2022-08-05 NOTE — Patient Instructions (Signed)
Include a protein drink as a meal or snack to help meet daily needs.  Continue with moving as much as possible throughout each day.

## 2022-08-05 NOTE — Addendum Note (Signed)
Addended by: Harl Bowie on: 08/05/2022 12:00 PM   Modules accepted: Orders

## 2022-08-05 NOTE — Progress Notes (Addendum)
Patient ID: Mary [name], female    DOB: 07-08-1961, 62 y.o.   MRN: [id number]   Chief Complaint  Patient presents with   Consult    The patient is a 62 year old female here for evaluation of her legs.  The patient thinks she has lymphedema and is here for consult for that. She was seen by Dr. Delana Meyer in [place] and compression stockings were recommended and a compression device.  The patient is not really using it because she says its too long.  She is going to try and see if she can get shorter ones.  She also has panic attacks when she gets too warm.  She was evaluated for her vascular integrity.  There was some reflux within the femoral veins bilaterally.  It is thought that the patient has stage III lymphedema.  And that is why the lymphatic pumps were approved.  She also has hypertension and hypercholesterolemia.  She has had a gastric bypass.  She is 5 feet 6 inches tall and weighs 275 pounds.  She also had a left total knee replacement several years ago.  The patient and her husband are under the thought process that she does not need anything.  I have expressed that that would be in your medical miracle for her to continue to gain weight and not be eating.  I challenged her to look at what she is eating and when she is feeding it.    Review of Systems  Constitutional:  Positive for activity change. Negative for appetite change.  HENT: Negative.    Eyes: Negative.   Respiratory: Negative.    Cardiovascular:  Positive for leg swelling.  Gastrointestinal:  Positive for abdominal pain.  Endocrine: Negative.   Genitourinary: Negative.   Musculoskeletal:  Positive for back pain.    Past Medical History:  Diagnosis Date   Anemia    low iron at times   Arthritis    Colon polyps    Complication of anesthesia 1991   Pt reports she awoke during plantar fascia surgery and felt "everything"   Heart murmur    " small per patient"    Hyperlipidemia    Hypertension     Hypothyroidism    Lactose intolerance    PAC (premature atrial contraction)    Palpitations    Panic attacks    Pt reports restrictive clothing/areas and dry mouth cause her to have attack.   Peripheral vascular disease ([place])    "small" clot after thermal ablasion   Pneumonia 2016   "walking pneumonia" 3 times in 2016   Shortness of breath    going upstairs (due to weight). not so much since weight loss   Sleep apnea    after weight loss does not have to use cpap   Vitamin B12 deficiency    Vitamin D deficiency     Past Surgical History:  Procedure Laterality Date   [place]   twins   COLONOSCOPY     COLONOSCOPY WITH PROPOFOL N/A 02/27/2018   Procedure: COLONOSCOPY WITH PROPOFOL;  Surgeon: Toledo, Benay Pike, MD;  Location: [facility];  Service: Gastroenterology;  Laterality: N/A;   IRRIGATION AND DEBRIDEMENT HEMATOMA Right 11/12/2015   Procedure: IRRIGATION AND DEBRIDEMENT HEMATOMA;  Surgeon: Robert Bellow, MD;  Location: [facility] OR[facility];  Service: General;  Laterality: Right;   KNEE ARTHROSCOPY WITH MEDIAL MENISECTOMY Left 07/15/2020   Procedure: Left knee arthroscopy with partial medial or lateral menisectomy,  possible chondroplasty, possible partial synovectomy;  Surgeon: Lovell Sheehan, MD;  Location: [facility] ORS;  Service: Orthopedics;  Laterality: Left;   LAMINECTOMY  07/10/2013   L4   L5   DR Ronnald Ramp   LAPAROSCOPIC SALPINGO OOPHERECTOMY Right 04/17/2014   Procedure: LAPAROSCOPIC SALPINGO OOPHORECTOMY RIGHT;  Surgeon: Everitt Amber, MD;  Location: [facility];  Service: Gynecology;  Laterality: Right;   LYSIS OF ADHESION  04/17/2014   Procedure: LYSIS OF ADHESION;  Surgeon: Everitt Amber, MD;  Location: [facility];  Service: Gynecology;;   MAXIMUM ACCESS (MAS)POSTERIOR LUMBAR INTERBODY FUSION (PLIF) 1 LEVEL  07/10/2013   Procedure: FOR MAXIMUM ACCESS (MAS) POSTERIOR LUMBAR INTERBODY FUSION (PLIF) LUMBAR FOUR-FIVE;  Surgeon: Eustace Moore, MD;  Location: [facility];  Service: Neurosurgery;;  FOR MAXIMUM ACCESS (MAS) POSTERIOR LUMBAR INTERBODY FUSION (PLIF) LUMBAR FOUR-FIVE   OOPHORECTOMY     LSO   OPEN REDUCTION INTERNAL FIXATION (ORIF) DISTAL RADIAL FRACTURE Left 09/20/2018   Procedure: OPEN REDUCTION INTERNAL FIXATION (ORIF) DISTAL RADIAL FRACTURE, LEFT;  Surgeon: Hessie Knows, MD;  Location: [facility] OR[facility];  Service: Orthopedics;  Laterality: Left;   PLANTAR FASCIA SURGERY  1999   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   VAGINAL HYSTERECTOMY  2001   for bleeding and LSO for cyst      Current Outpatient Medications:    Continuous Blood Gluc Receiver ([place]) DEVI, Use to check glucose as directed, Disp: 1 each, Rfl: 0   Continuous Blood Gluc Sensor (DEXCOM G7 SENSOR) MISC, Change sensor every 10 days, Disp: 3 each, Rfl: 2   cyanocobalamin (,VITAMIN B-12,) 1000 MCG/ML injection, Inject 1 mL (1,000 mcg total) into the muscle every 14 (fourteen) days., Disp: 10 mL, Rfl: 2   EPINEPHrine 0.3 mg/0.3 mL IJ SOAJ injection, Inject 0.3 mLs into the muscle as needed., Disp: , Rfl:    hydrochlorothiazide (HYDRODIURIL) 25 MG tablet, Take 1 tablet (25 mg total) by mouth daily. (Patient not taking: Reported on 09/20/2022), Disp: 90 tablet, Rfl: 3   meloxicam (MOBIC) 15 MG tablet, Take by mouth daily as needed for pain., Disp: , Rfl:    simvastatin (ZOCOR) 40 MG tablet, Take 1 tablet (40 mg total) by mouth at bedtime., Disp: 90 tablet, Rfl: 3   Vitamin D, Ergocalciferol, (DRISDOL) 1.25 MG (50000 UNIT) CAPS capsule, Take 1 capsule (50,000 Units total) by mouth every 7 (seven) days., Disp: 5 capsule, Rfl: 5   zolpidem (AMBIEN CR) 12.5 MG CR tablet, Take 1 tablet (12.5 mg total) by mouth at bedtime as needed for sleep., Disp: 30 tablet, Rfl: 5   benzonatate (TESSALON) 200 MG capsule, Take 1 capsule (200 mg total) by mouth 3 (three) times daily as needed for cough., Disp: 30 capsule, Rfl: 0   diphenoxylate-atropine (LOMOTIL) 2.5-0.025 MG tablet, Take 1 tablet by mouth  4 (four) times daily as needed for diarrhea or loose stools. First dose should consist of 2 tablets, followed by 1 tablet every 6 hours. (Patient not taking: Reported on 09/20/2022), Disp: 12 tablet, Rfl: 0   diphenoxylate-atropine (LOMOTIL) 2.5-0.025 MG tablet, Take 1 tablet by mouth 4 (four) times daily as needed for diarrhea or loose stools. Take 2 tablets at once.  Then 1 tablet post loose stool bowel movement.  Max of 8 tablets/day.  Repeat on day 2.  On day take 3, tablet q. BM again for max 8/day., Disp: 30 tablet, Rfl: 0   levothyroxine (SYNTHROID) 100 MCG tablet, Take 1 tablet (100 mcg total) by mouth daily before breakfast.,  Disp: 90 tablet, Rfl: 3   phentermine 37.5 MG capsule, Take 1 capsule (37.5 mg total) by mouth every morning., Disp: 30 capsule, Rfl: 0   promethazine-dextromethorphan (PROMETHAZINE-DM) 6.25-15 MG/5ML syrup, Take 5 mLs by mouth 4 (four) times daily as needed for cough., Disp: 118 mL, Rfl: 0   sulfamethoxazole-trimethoprim (BACTRIM DS) 800-160 MG tablet, Take 1 tablet by mouth 2 (two) times daily. (Patient not taking: Reported on 09/20/2022), Disp: 20 tablet, Rfl: 0   Objective:   Vitals:   08/05/22 0815  BP: (!) 161/79  Pulse: 90  SpO2: 99%    Physical Exam Vitals and nursing note reviewed.  Constitutional:      Appearance: Normal appearance.  HENT:     Head: Atraumatic.  Cardiovascular:     Rate and Rhythm: Normal rate.     Pulses: Normal pulses.  Pulmonary:     Effort: Pulmonary effort is normal.  Abdominal:     General: There is no distension.     Palpations: Abdomen is soft.     Tenderness: There is no abdominal tenderness.  Skin:    General: Skin is warm.     Capillary Refill: Capillary refill takes less than 2 seconds.     Coloration: Skin is not jaundiced.     Findings: No bruising.  Neurological:     Mental Status: She is alert and oriented to person, place, and time.  Psychiatric:        Mood and Affect: Mood normal.        Behavior:  Behavior normal.        Thought Content: Thought content normal.        Judgment: Judgment normal.     Assessment & Plan:  Obstructive sleep apnea syndrome - Plan: Ambulatory referral to Physical Therapy  History of gastric bypass - Plan: Ambulatory referral to Physical Therapy, Amb Ref to Medical Weight Management  Chronic bilateral low back pain, unspecified whether sciatica present - Plan: Ambulatory referral to Physical Therapy  Prediabetes - Plan: Ambulatory referral to Physical Therapy, Amb Ref to Medical Weight Management  Lymphedema  Patient will be referred to healthy weight and wellness.  I totally agree with continuing with the lymphatic pumps and the graduated compression stockings.  She also must limit or even eliminate the salt from her diet apparently this is an issue for her.  We will also recommend physical therapy for lymphatic training.  Will do a follow-up visit virtually in about 3 months.  No surgery is recommended from a plastic surgery standpoint and liposuction could make this much worse.  Pictures were obtained of the patient and placed in the chart with the patient's or guardian's permission.     [place], DO

## 2022-08-05 NOTE — Addendum Note (Signed)
Addended by: Harl Bowie on: 08/05/2022 12:32 PM   Modules accepted: Orders

## 2022-08-05 NOTE — Progress Notes (Signed)
Medical Nutrition Therapy: Visit start time: 1320  end time: 1355  Assessment:  Diagnosis: prediabetes Medical history changes: no changes Psychosocial issues/ stress concerns: none  Medications, supplement changes: no changes per patient   Current weight: 275.4lbs Height: 5'6.5"  BMI: 43.78  Progress and evaluation:  Patient voices frustration with slow weight loss. Weight is down about 5lbs since previous visit on 05/05/22. Reports increase in fruit intake.  Continues to work on movement as tolerated throughout the day.    Dietary Intake:  Usual eating pattern includes 3 meals and 1-2 snacks per day. Dining out frequency: ? meals per week.  Breakfast: bowl with egg, sausage/ bacon/ cheese + apple Snack: 8:30-10am overnight oats + banana + nuts; peanut butter and fruit Lunch: canned chicken with 3-4 crackers; am snack sometimes counts as lunch Snack: none Supper: fish/ lean meat + potato/ sweet potato + veg Snack: none or fruit Beverages: water, coffee with stevia  Physical activity: 1- 1.5 miles walking daily, throughout day sedentary job  Intervention:   Nutrition Care Education:  Basic nutrition: reviewed appropriate meal and snack schedule; general nutrition guidelines    Weight control: reviewed progress since previous visit; tracking food intake; metabolic effects of long term low calorie intake, limited physical activity; options of intensive weight loss programs, use of weight loss medications; reviewed suitable options for protein drinks  Other Intervention Notes: Gave positive feedback for progress made, discussed likelihood of ongoing slow progression with weight loss.  Patient will plan to contact bariatric surgery clinic for further help, advice. No additional MNT visits scheduled at this time; patient will schedule if needed after next consult with medical provider.   Nutritional Diagnosis:  [place]-2.1 Inpaired nutrition utilization and [place]-2.2 Altered  nutrition-related laboratory As related to history of gastric bypass surgery, prediabetes.  As evidenced by small food portions, limited tolerance of sugar; prediabetes evidenced by elevated HbA1C. [place]-3.3 Overweight/obesity As related to history of dieting and weight loss surgery, limited physical activity.  As evidenced by patient with current BMI of 43.78, making diet and lifestyle changes to promote weight loss and improve diabetes risk.   Education Materials given:  Visit summary with goals/ instructions   Learner/ who was taught:  Patient  Spouse/ partner  Level of understanding: Verbalizes/ demonstrates competency  Demonstrated degree of understanding via:   Teach back Learning barriers: None  Willingness to learn/ readiness for change: Eager, change in progress   Monitoring and Evaluation:  Dietary intake, exercise, BG control, and body weight      follow up: prn

## 2022-08-11 ENCOUNTER — Telehealth (INDEPENDENT_AMBULATORY_CARE_PROVIDER_SITE_OTHER): Payer: Self-pay

## 2022-08-11 NOTE — Telephone Encounter (Signed)
Spoke with Kern Alberta with [place] regarding the patient having a lymph pump. The pump was ordered on 12/2021 and patient received the pump in July. Patient is now stated she needs new sleeves. I informed Matt regarding this and will be contact the patient to access the situation.

## 2022-08-15 ENCOUNTER — Other Ambulatory Visit: Payer: Self-pay

## 2022-08-15 DIAGNOSIS — E079 Disorder of thyroid, unspecified: Secondary | ICD-10-CM

## 2022-08-16 ENCOUNTER — Encounter: Payer: Self-pay | Admitting: Physician Assistant

## 2022-08-16 ENCOUNTER — Other Ambulatory Visit: Payer: Self-pay

## 2022-08-16 ENCOUNTER — Ambulatory Visit: Payer: Self-pay | Admitting: Physician Assistant

## 2022-08-16 VITALS — BP 146/86 | HR 88 | Temp 97.5°F | Resp 12 | Ht 66.5 in | Wt 272.0 lb

## 2022-08-16 DIAGNOSIS — E079 Disorder of thyroid, unspecified: Secondary | ICD-10-CM

## 2022-08-16 MED ORDER — LEVOTHYROXINE SODIUM 100 MCG PO TABS: 100.0000 ug | ORAL_TABLET | Freq: Every day | ORAL | 3 refills | Status: DC

## 2022-08-16 MED ORDER — PHENTERMINE HCL 37.5 MG PO CAPS: 37.5000 mg | ORAL_CAPSULE | ORAL | 0 refills | Status: DC

## 2022-08-16 NOTE — Progress Notes (Signed)
   Subjective: Weight loss    Patient ID: Mary [name], female    DOB: Feb 02, 1961, 62 y.o.   MRN: [id number]  HPI Patient is following up status post starting phentermine for weight loss.  Patient given a prescription for 30 mg last month has lost 8 pounds since last visit.  Denies side effects of medications.  Continues to seek authorization for Ozempic.   Review of Systems Hypertension, hypothyroidism, lymphedema, and obesity.    Objective:   Physical Exam  BP is 146/86, pulse 88, respiration 12, temperature 97.5, and patient is 90% O2 sat on room air.  Patient weighs 272 pounds and BMI is 43.24.      Assessment & Plan: Morbid obesity   Advised patient doses increased to 37.5 phentermine.  Continue previous medications and follow-up in 1 month.

## 2022-08-16 NOTE — Progress Notes (Signed)
States she's been taking HCTZ off & on.  States Vascular physician told her she didn't need to take it because of the lymphadema.  Sent refill request for Levothyroxine  AMD

## 2022-09-05 ENCOUNTER — Ambulatory Visit: Payer: Self-pay | Admitting: Physician Assistant

## 2022-09-05 ENCOUNTER — Other Ambulatory Visit: Payer: Self-pay

## 2022-09-05 ENCOUNTER — Other Ambulatory Visit: Payer: Self-pay | Admitting: Physician Assistant

## 2022-09-05 DIAGNOSIS — U071 COVID-19: Secondary | ICD-10-CM

## 2022-09-05 MED ORDER — PROMETHAZINE-DM 6.25-15 MG/5ML PO SYRP: 5.0000 mL | ORAL_SOLUTION | Freq: Four times a day (QID) | ORAL | 0 refills | Status: DC | PRN

## 2022-09-05 MED ORDER — NIRMATRELVIR/RITONAVIR (PAXLOVID)TABLET: 3.0000 | ORAL_TABLET | Freq: Two times a day (BID) | ORAL | 0 refills | Status: DC

## 2022-09-05 MED ORDER — NIRMATRELVIR/RITONAVIR (PAXLOVID)TABLET: 3.0000 | ORAL_TABLET | Freq: Two times a day (BID) | ORAL | 0 refills | Status: AC

## 2022-09-05 NOTE — Progress Notes (Signed)
   Subjective: Cough and congestion    Patient ID: Mary [name], female    DOB: 18-Feb-1961, 62 y.o.   MRN: [id number]  HPI Patient complain of cough, chest congestion, and fatigue for 2 days.  Patient has been exposed to COVID-19 by her spouse.  Patient test positive for COVID-19 this morning.   Review of Systems Patient past medical history remarkable for essential hypertension chronic renal insufficiency obstructive sleep apnea and reactive hypoglycemia.    Objective:   Physical Exam  Physical exam was deferred secondary to this being a telephonic encounter.     Assessment & Plan: COVID-19   Patient given prescription for Paxlovid and Phenergan DM.  Patient advised on quarantine requirements will follow-up in 3 days if no improvement or worsening complaints.  Patient is not to take statin while taking Paxlovid.

## 2022-09-09 ENCOUNTER — Telehealth: Payer: Self-pay

## 2022-09-09 DIAGNOSIS — J3489 Other specified disorders of nose and nasal sinuses: Secondary | ICD-10-CM

## 2022-09-09 MED ORDER — SULFAMETHOXAZOLE-TRIMETHOPRIM 800-160 MG PO TABS: 1.0000 | ORAL_TABLET | Freq: Two times a day (BID) | ORAL | 0 refills | Status: DC

## 2022-09-09 NOTE — Telephone Encounter (Signed)
Review Joclyn's symptoms with Mardee Postin, PA-C.  Received order for Amoxicillin 875 mg 1 tab po bid x7 days #14 No refills  If no better, schedule appt for next week.  Called Ron back.  Kahdijah is allergic to Amoxicillin  Received new order for Bactrim DS 1 tab po bid x10 days #20 No refills  AMD

## 2022-09-09 NOTE — Telephone Encounter (Signed)
S/Sx started Sunday evening mainly with cough & Dx'd Monday with covid.  Prescribed Paxlovid & Promethazine.  Called yesterday C/O teeth hurting & left side of face hurting.  Spoke with Annamaria Boots, RN.  Advised to take Ibuprofen/Tylenol and Pseudophed and warm compresses.  Called this morning around 8:35 am stating she tried the things J. Bishop, RN advised & they are not helping.  Feels the same if not slightly worse today than yesterday.  States teeth (top & bottom) are still hurting and she has a throbbing sensation at left cheek bone that radiates toward corner of ear and temple (this was going on yesterday too).  Denies H/A. Left ear feels a little achy, but it's not stopped up.  Head feels tight & not blowing anything from her nose.  Productive cough (thick white/yellowish phlegm.   Today is the last day of Paxlovid & still using Promethazine cough syrup.  Satrina states it feels like a sinus infection & thinks she needs an antibiotic  Please advise.  AMD

## 2022-09-15 ENCOUNTER — Encounter: Payer: Self-pay | Admitting: Physician Assistant

## 2022-09-15 ENCOUNTER — Ambulatory Visit: Payer: Self-pay | Admitting: Physician Assistant

## 2022-09-15 DIAGNOSIS — R197 Diarrhea, unspecified: Secondary | ICD-10-CM

## 2022-09-15 MED ORDER — DIPHENOXYLATE-ATROPINE 2.5-0.025 MG PO TABS: 1.0000 | ORAL_TABLET | Freq: Four times a day (QID) | ORAL | 0 refills | Status: DC | PRN

## 2022-09-15 NOTE — Progress Notes (Signed)
Stopped [employer] D and ABT yesterday when she had 4 episodes of loose BM since supper and c/o stomach.  Con't cough and congestion complaints since covid positive.  Stated tried [employer] and upset GI taking a diet of toast and egg today.  Denies fever.  Request to speak to provider as covid recovery "has been awful".

## 2022-09-15 NOTE — Progress Notes (Signed)
   Subjective: COVID-19    Patient ID: Mary [name], female    DOB: 1960-10-24, 62 y.o.   MRN: [id number]  HPI Patient 2 weeks post home diagnosis of COVID-19.  Patient call on 09/09/2022 requesting antibiotic because her symptoms were like getting better.  Patient stated after starting antibiotics she developed diarrhea.  Patient states she discontinued the antibiotics yesterday and also started taking Allegra-D.  No improvement with diarrhea but increase of nasal congestion.   Review of Systems Hypertension hypothyroidism.  Obstructive sleep apnea    Objective:   Physical Exam  Physical exam was deferred secondary to this being a telephonic encounter.      Assessment & Plan: Diarrhea and nasal congestion post COVID   Advised patient to stop antibiotics.  Discussed that this is probably destroying the normal flora in her stomach that she needs.  Patient advised to restart Allegra-D.  Advised to purchase over-the-counter yogurt.  Patient given prescription for Lomotil to take as directed.  Advised patient to follow-up in this clinic in 4 to 5 days.

## 2022-09-20 ENCOUNTER — Ambulatory Visit
Admission: RE | Admit: 2022-09-20 | Discharge: 2022-09-20 | Disposition: A | Discharge status: Home / Self Care | Payer: 59 | Attending: Physician Assistant | Admitting: Physician Assistant

## 2022-09-20 ENCOUNTER — Ambulatory Visit
Admission: RE | Admit: 2022-09-20 | Discharge: 2022-09-20 | Disposition: A | Discharge status: Home / Self Care | Payer: 59 | Source: Ambulatory Visit | Attending: Physician Assistant | Admitting: Physician Assistant

## 2022-09-20 ENCOUNTER — Encounter: Payer: Self-pay | Admitting: Physician Assistant

## 2022-09-20 ENCOUNTER — Ambulatory Visit: Payer: Self-pay | Admitting: Physician Assistant

## 2022-09-20 VITALS — BP 150/85 | HR 88 | Temp 97.3°F | Resp 12 | Wt 274.0 lb

## 2022-09-20 DIAGNOSIS — B349 Viral infection, unspecified: Secondary | ICD-10-CM

## 2022-09-20 DIAGNOSIS — R053 Chronic cough: Secondary | ICD-10-CM | POA: Diagnosis present

## 2022-09-20 MED ORDER — DIPHENOXYLATE-ATROPINE 2.5-0.025 MG PO TABS: 1.0000 | ORAL_TABLET | Freq: Four times a day (QID) | ORAL | 0 refills | Status: DC | PRN

## 2022-09-20 MED ORDER — BENZONATATE 200 MG PO CAPS: 200.0000 mg | ORAL_CAPSULE | Freq: Three times a day (TID) | ORAL | 0 refills | Status: DC | PRN

## 2022-09-20 NOTE — Progress Notes (Signed)
Diarrhea since last Wednesday - 4x today since midnigh Average 12 - 13 x  Still getting up 2-3x /night  Completed the Lomotil Currently using Pepto, Yogurt & Gatorlite  States no energy Appetite down  Hasn't taken phentermine past 2 weeks  Still has some congestion Didn't take Allegra last night or this morning - didn't want it to mask her symptoms  AMD

## 2022-09-20 NOTE — Progress Notes (Signed)
   Subjective: Viral illness    Patient ID: Mary [name], female    DOB: 01/30/1961, 62 y.o.   MRN: [id number]  HPI Patient with diagnosis of COVID-19 on 09/05/2022.  Patient states she developed diarrhea on 09/15/2022.  Diarrhea improved after being prescribed Lomotil but returned after she finished that medication.  Patient is continue to have nasal congestion and nonproductive cough.  Patient is taking oral electrolyte drinks and eating yogurt.  States had chills continuously.  Unsure of fever.   Review of Systems Hypertension, hypothyroidism, obstructive sleep apnea.    Objective:   Physical Exam  BP is 150/85.  Pulse 88, respiration 12, temperature 97.3, and patient is 90% O2 sat on room air.  Patient weighs 274 pounds and BMI is 46.56. HEENT is remarkable for bilateral maxillary guarding with palpation right greater than left.  Pharynx shows postnasal drainage. Neck is supple followed lymphadenectomy or bruits. Lungs are clear to auscultation but increased cough status post deep inspirations. Heart is regular rate and rhythm. Abdomen with distention secondary to body habitus.  Hyper active bowel sounds.  Soft and nontender to palpation.      Assessment & Plan: Viral illness  Patient sent for chest x-ray.  Patient represcribed Lomotil to take as directed.  Patient given a prescription for Tessalon Perles.  Patient advised to restart Allegra-D.  Patient will follow-up with 2 days.

## 2022-09-26 ENCOUNTER — Ambulatory Visit: Payer: 59 | Admitting: Physical Therapy

## 2022-10-03 ENCOUNTER — Other Ambulatory Visit: Payer: Self-pay

## 2022-10-03 DIAGNOSIS — G4700 Insomnia, unspecified: Secondary | ICD-10-CM

## 2022-10-03 MED ORDER — ZOLPIDEM TARTRATE ER 12.5 MG PO TBCR: 12.5000 mg | EXTENDED_RELEASE_TABLET | Freq: Every evening | ORAL | 5 refills | Status: DC | PRN

## 2022-10-04 ENCOUNTER — Ambulatory Visit: Payer: 59 | Admitting: Physical Therapy

## 2022-10-12 ENCOUNTER — Encounter: Payer: 59 | Admitting: Physical Therapy

## 2022-10-13 ENCOUNTER — Encounter: Payer: Self-pay | Admitting: Occupational Therapy

## 2022-10-13 ENCOUNTER — Ambulatory Visit: Payer: 59 | Attending: Plastic Surgery | Admitting: Occupational Therapy

## 2022-10-13 DIAGNOSIS — R7303 Prediabetes: Secondary | ICD-10-CM | POA: Diagnosis not present

## 2022-10-13 DIAGNOSIS — G4733 Obstructive sleep apnea (adult) (pediatric): Secondary | ICD-10-CM | POA: Insufficient documentation

## 2022-10-13 DIAGNOSIS — M545 Low back pain, unspecified: Secondary | ICD-10-CM | POA: Diagnosis not present

## 2022-10-13 DIAGNOSIS — Z9884 Bariatric surgery status: Secondary | ICD-10-CM | POA: Diagnosis not present

## 2022-10-13 DIAGNOSIS — I890 Lymphedema, not elsewhere classified: Secondary | ICD-10-CM | POA: Diagnosis present

## 2022-10-13 DIAGNOSIS — G8929 Other chronic pain: Secondary | ICD-10-CM | POA: Insufficient documentation

## 2022-10-14 ENCOUNTER — Other Ambulatory Visit: Payer: 59

## 2022-10-14 ENCOUNTER — Encounter: Payer: 59 | Admitting: Physical Therapy

## 2022-10-14 DIAGNOSIS — Z1152 Encounter for screening for COVID-19: Secondary | ICD-10-CM

## 2022-10-14 LAB — POC COVID19 BINAXNOW: SARS Coronavirus 2 Ag: NEGATIVE

## 2022-10-14 LAB — POCT INFLUENZA A/B
Influenza A, POC: NEGATIVE
Influenza B, POC: NEGATIVE

## 2022-10-14 NOTE — Progress Notes (Signed)
Cough, congestion sore throat since yesterday.

## 2022-10-14 NOTE — Therapy (Addendum)
OUTPATIENT OCCUPATIONAL THERAPY LOWER EXTREMITY LYMPHEDEMA EVALUATION  Patient Name: Mary [name] MRN: [id number] DOB:05/01/1961, 62 y.o., female Today's Date: 10/14/2022  END OF SESSION:  OT End of Session - 10/13/22 1523     Visit Number 1    Number of Visits 36    Date for OT Re-Evaluation 01/11/23    OT Start Time 0310    OT Stop Time 0415    OT Time Calculation (min) 65 min    Activity Tolerance Patient tolerated treatment well;No increased pain    Behavior During Therapy WFL for tasks assessed/performed               Past Medical History:  Diagnosis Date   Anemia    low iron at times   Arthritis    Colon polyps    Complication of anesthesia 1991   Pt reports she awoke during plantar fascia surgery and felt "everything"   Heart murmur    " small per patient"    Hyperlipidemia    Hypertension    Hypothyroidism    Lactose intolerance    PAC (premature atrial contraction)    Palpitations    Panic attacks    Pt reports restrictive clothing/areas and dry mouth cause her to have attack.   Peripheral vascular disease    "small" clot after thermal ablasion   Pneumonia 2016   "walking pneumonia" 3 times in 2016   Shortness of breath    going upstairs (due to weight). not so much since weight loss   Sleep apnea    after weight loss does not have to use cpap   Vitamin B12 deficiency    Vitamin D deficiency    Past Surgical History:  Procedure Laterality Date   APPENDECTOMY  1985   CESAREAN SECTION  1987   twins   COLONOSCOPY     COLONOSCOPY WITH PROPOFOL N/A 02/27/2018   Procedure: COLONOSCOPY WITH PROPOFOL;  Surgeon: Toledo, Boykin Nearing, MD;  Location: [facility];  Service: Gastroenterology;  Laterality: N/A;   IRRIGATION AND DEBRIDEMENT HEMATOMA Right 11/12/2015   Procedure: IRRIGATION AND DEBRIDEMENT HEMATOMA;  Surgeon: Earline Mayotte, MD;  Location: [facility];  Service: General;  Laterality: Right;   KNEE ARTHROSCOPY WITH MEDIAL MENISECTOMY Left  07/15/2020   Procedure: Left knee arthroscopy with partial medial or lateral menisectomy, possible chondroplasty, possible partial synovectomy;  Surgeon: Lyndle Herrlich, MD;  Location: [facility];  Service: Orthopedics;  Laterality: Left;   LAMINECTOMY  07/10/2013   L4   L5   DR Yetta Barre   LAPAROSCOPIC SALPINGO OOPHERECTOMY Right 04/17/2014   Procedure: LAPAROSCOPIC SALPINGO OOPHORECTOMY RIGHT;  Surgeon: Adolphus Birchwood, MD;  Location: [facility];  Service: Gynecology;  Laterality: Right;   LYSIS OF ADHESION  04/17/2014   Procedure: LYSIS OF ADHESION;  Surgeon: Adolphus Birchwood, MD;  Location: [facility];  Service: Gynecology;;   MAXIMUM ACCESS (MAS)POSTERIOR LUMBAR INTERBODY FUSION (PLIF) 1 LEVEL  07/10/2013   Procedure: FOR MAXIMUM ACCESS (MAS) POSTERIOR LUMBAR INTERBODY FUSION (PLIF) LUMBAR FOUR-FIVE;  Surgeon: Tia Alert, MD;  Location: [facility];  Service: Neurosurgery;;  FOR MAXIMUM ACCESS (MAS) POSTERIOR LUMBAR INTERBODY FUSION (PLIF) LUMBAR FOUR-FIVE   OOPHORECTOMY     LSO   OPEN REDUCTION INTERNAL FIXATION (ORIF) DISTAL RADIAL FRACTURE Left 09/20/2018   Procedure: OPEN REDUCTION INTERNAL FIXATION (ORIF) DISTAL RADIAL FRACTURE, LEFT;  Surgeon: Kennedy Bucker, MD;  Location: [facility];  Service: Orthopedics;  Laterality: Left;   PLANTAR FASCIA SURGERY  1999   ROUX-EN-Y GASTRIC  BYPASS  06/2006   VAGINAL HYSTERECTOMY  2001   for bleeding and LSO for cyst   Patient Active Problem List   Diagnosis Date Noted   Reactive hypoglycemia 02/16/2022   Prediabetes 02/16/2022   Chronic venous insufficiency 12/23/2021   Lymphedema 12/13/2021   Panic attacks    Strain of knee 12/24/2019   Vitamin B12 deficiency 10/08/2019   Arthritis 07/16/2019   Edema 07/16/2019   History of abdominal pain 07/16/2019   Low back pain 07/16/2019   Vitamin D deficiency 07/16/2019   Hypothyroidism 02/28/2019   History of gastric bypass 02/28/2019   Obstructive sleep apnea syndrome 02/28/2019   Insomnia 02/28/2019   Class 3 severe  obesity due to excess calories with serious comorbidity and body mass index (BMI) of 40.0 to 44.9 in adult 02/28/2019   Elevated BP without diagnosis of hypertension 02/28/2019   Mixed hyperlipidemia 02/28/2019   Hot flashes 02/28/2019   BMI 40.0-44.9, adult 12/16/2015   Hematoma of lower extremity 11/12/2015   Essential hypertension 06/20/2014   Pure hypercholesterolemia 06/20/2014   S/P lumbar spinal fusion 07/10/2013   Arthrodesis status 07/10/2013   Palpitations 01/02/2012   Edema of both legs 01/02/2012    PCP: Joni Reining, PA-C  REFERRING PROVIDER: Foster Simpson, DO  REFERRING DIAG: I89.0  THERAPY DIAG:  Lymphedema, not elsewhere classified  Rationale for Evaluation and Treatment: Rehabilitation  ONSET DATE: Insidious onset with progression over time. Greater than 20 years  SUBJECTIVE:                                                                                                                                                                                           SUBJECTIVE STATEMENT:Mary [name] is referred to Occupational Therapy by Alena Bills. Dillingham, MD, for evaluation and treatment of BLE lymphedema. Pt denies known family history of limb swelling. She reports she has not been treated for lymphedema in the past, but she did try to wear ccl 1 ( 20-30 mmHg) thigh highs and knee highs in the past. At present she uses a basic BioTab vaso pneumatic device "regularly".   PERTINENT HISTORY and contributing factors include, OA, HTN, Panic attacks 2/2 restrictive clothing, OSA (not using CPAP), s/p hysterectomy 1987, L knee sx, oophorectomy, s/p hysterectomy 2001, Gastric Bypass 2007, s/p plantar fascia sx 1999, obesity, insomnia  PAIN:  Are you having pain? Yes: NPRS scale: 6/10 Pain location: BLE- generalized, knees Pain description: discomfort, heavy, full, sore Aggravating factors: standing, walking, extended dependent sitting Relieving factors:  elevation  PRECAUTIONS: Other: LYMPHEDEMA PRECAUTIONS: HYPOTHYROID, DIABETES SKIN PRECAUTIONS  WEIGHT BEARING RESTRICTIONS: No  FALLS:  Has patient  fallen in last 6 months? 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  Skin  Description Hyper-Keratosis Peau' de Orange Shiny Tight Fibrotic/  Fatty Doughy Spongy/ boggy       x x x    Skin dry Flaky WNL Macerated   slight      Color Redness Varicosities Blanching Hemosiderin Stain Mottled        x   Odor Malodorous Yeast Fungal infection  WNL      x   Temperature Warm Cool wnl     x    Pitting Edema   1+ 2+ 3+ 4+ Non-pitting         x   Girth Symmetrical Asymmetrical                   Distribution   x  Ankles to waste-symmetrical B upper arms    Stemmer Sign Positive Negative    x   Lymphorrhea History Of:  Present Absent     x    Wounds History Of Present Absent Venous Arterial Pressure Sheer     x        Signs of Infection Redness Warmth Erythema Acute Swelling Drainage Borders                    Sensation Light Touch Deep pressure Hypersensitivity   Present Impaired Present Impaired Absent Impaired   x  x   x    Nails WNL   Fungus nail dystrophy   x     Hair Growth Symmetrical Asymmetrical   x    Skin Creases Base of toes  Ankles   Base of Fingers knees       Abdominal pannus Thigh Lobules  Face/neck   x x  x        POSTURE: WNL  LE ROM: Limited in hip,  knee and ankle flexion by body habitus, limb swelling, skin approximation, and joint pain 2/2 OA  LE MMT: WFL  FOTO functional outcome measure:  10/13/22 INTAKE: 56%  LYMPHEDEMA LIFE IMPACT SCALE (LLIS):  10/13/22 INTAKE 36.76% (The extent to which lymphedema related problems affected your life last week)  BLE COMPARATIVE LIMB VOLUMETRICS:  TBA OT Rx visit 1  INTAKE:  LANDMARK RIGHT    R LEG (A-D) N/A  R THIGH (E-G) ml  R FULL LIMB (A-G) ml  Limb Volume differential (LVD)  %  Volume change since initial %  Volume change overall V  (Blank rows = not tested)  LANDMARK LEFT   L LEG (A-D) N/A  L THIGH (E-G) ml  L FULL LIMB (A-G) ml  Limb Volume differential (LVD)  %  Volume change since initial %  Volume change overall %  (Blank rows = not tested)    GAIT: increased upper body motion w waddling gait and increased hip abduction Distance walked: 300' Assistive device utilized: None Level of assistance: Modified Independence (extra time)   TODAY'S TREATMENT:  PATIENT EDUCATION:  Education details: Provided Pt/ family education regarding lymphatic structure and function, lymphedema etiology, onset patterns and stages of progression. Taught interaction between blood circulatory system and lymphatic circulation.Discussed  impact of gravity and co-morbidities on lymphatic function. Outlined Complete Decongestive Therapy (CDT)  as standard of care and provided in depth information regarding 4 primary components of Intensive and Self Management Phases, including Manual Lymph Drainage (MLD), compression wrapping and garments, skin care, and therapeutic exercise. Homero FellersFrank discussion with re need for frequent attendance and high burden of care when caregiver is needed, impact of co morbidities. We discussed  the chronic, progressive nature of lymphedema and  Importance of daily, ongoing LE self-care essential for limiting progression and infection risk. Discussed the demanding nature of CDT, high burden of care between visits and throughout self-management phase,  and importance of high level of compliance with all home program components for optimal outcome.   Lymphedema management has a high burden of care, especially for Patients who have difficulty, or who are unable to reach their distal legs and feet.  reach their feet and distal legs to apply bandages, compression garments, to bathe , inspect skin and groom nails. In this case because Pt is unable to perform these activities, daily caregiver assistance with the lymphedema self-care home program  between OT visits is essential for achieving clinical success.  Person educated: Patient and Spouse Education method: Explanation, Demonstration, and Handouts Education comprehension: verbalized understanding, returned demonstration, and needs further education  HOME EXERCISE PROGRAM: Lymphatic Pumping Therex-  1 set of 10, 2 x daily, in order, seated  Daily skin inspection and care with low ph lotion matching skin ph to limit infection risk Simple Self-MLD 1 x daily During Intensive Phase CDT: multilayer compression wraps from foot to groin using gradient techniques, one limb at a time  to ensure safety During Self -Management Phase CDT: Fit with custom daytime compression garments- consider flat knit, Elvarex Classic, ccl 2 ( 23-32 mmHg) knee highs paired with Capri pantyhose (B-G) for full time daily use Fit with BLE knee length Jobst RELAX to limit fibrosis formation  and facilitate increased lymphatic function during HOS Custom-made gradient compression garments and HOS devices are medically necessary in this case because they are uniquely sized and shaped to fit the exact dimensions of the affected extremities, and to provide accurate and consistent gradient compression essential for optimally managing  this patient's symptoms of chronic, progressive lymphedema. Multiple custom compression garments are needed for optimal hygiene. Custom compression garments should be replaced q 3-6 months When worn consistently for optimal effectiveness.  6. Fit Pt with advanced Flexitouch PLUS sequential pneumatic compression device medically necessary to treat lipo-lymphedema extending above the inguinal lymph nodes, including the hips, abdomen, buttocks and abdomen. This device follows lymphatic anatomy and provides proximal to distal stimulation above the inguinal lymph nodes to ensure optimal lymphatic return via the thoracic duct.   ASSESSMENT:  CLINICAL IMPRESSION: Mary [name] is a 62 y.o. 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 This combined condition is known as Lipo-lymphedema.Lipedema typically affects women. Unlike lymphedema, which tends to present as asymmetrical lipedema presents as symmetrical , and may include the arms. Lipedema is characterized by painful, disproportionate fat deposition below the waist. In this case lipedema extends to the ankles terminating with pantaloon-like ankle cuffs and spared feet. As lipedema progresses fatty fibrosis accumulates in the subcutis putting pressure on delicate lymphatics resulting in excessive lymph accumulation and progressive  lipo-lymphedema. This combined condition is difficult to treat as lipedema does not respond well to Complete Decongestive Therapy (CDT). The lymphedema component may respond to varying degrees depending on co-morbidities, quality and quantity of  fatty tissue fibrosis obstructing lymphatics, and degree of severity and progression. Well-fitting, comfortable compression garments that contain swelling and fatty tissue without constrict ing, or rolling down, often provide pain relief and enable improved functional mobility and functional performance in all occupational domains.  In this case  chronic, progressive lipo-lymphedema and associated pain limits Mrs. Semel functional ambulation and mobility, and her ability to perform functional activities in all occupational domains, including basic and instrumental ADLs, productive and work activities, leisure pursuits and social participation. Appearance of swollen legs limits her body image and QOL. Mrs Moxley will benefit from skilled Occupational Therapy for both modified Intensive and Self-Management Phases of Complete Decongestive Therapy (CDT) to include MLD, skin care, therapeutic exercise and appropriate compression garments, a trial with an ADVANCED sequential pneumatic compression device (Tactile Medical Flexitouch). Emphasis will be given to Pt edu each session to ensure Pt is able to perform all Lymphedema self-care home program components.  Without skilled OT painful, chronic lipo-lymphedema will progress, infection risk is increased, and further functional decline is expected.   OBJECTIVE IMPAIRMENTS: Abnormal gait, decreased activity tolerance, decreased balance, decreased knowledge of condition, decreased knowledge of use of DME, decreased mobility, difficulty walking, decreased ROM, increased edema, impaired sensation, obesity, pain, and chronic , progressive swelling and pain of buttocks, hips, abdomen and legs .   ACTIVITY LIMITATIONS: carrying, lifting, bending, sitting, standing, squatting, sleeping, stairs, transfers, bed mobility, bathing, dressing, hygiene/grooming, caring for others, and leisure pursuits, social participation, productive/ work activities  PARTICIPATION LIMITATIONS: meal prep, cleaning, laundry, interpersonal relationship, driving, shopping, community activity, occupation, yard work, and church  PERSONAL FACTORS: Age, Past/current experiences, hx of panic attacks and claustrophobia, Time since onset of injury/illness/exacerbation,, motivation, supportive spouse and family are also affecting patient's functional  outcome.   REHAB POTENTIAL: Fair Unfortunately the Lipedema does not respond to CDT and is unchanged by this protocol. Fortunately lymphedema  does respond to differing degrees based on the quality and amount of fatty fibrosis present obstructing lymphatics, and patient's tolerance if skin is hypersensitive.   EVALUATION COMPLEXITY: Moderate   GOALS: Goals reviewed with patient? Yes  SHORT TERM GOALS: Target date: 4th OT Rx visit   Pt will demonstrate understanding of lymphedema precautions and prevention strategies with modified independence using a printed reference to identify at least 5 precautions and discussing how s/he may implement them into daily life to reduce risk of progression with modified assistance ( printed reference). Baseline: Max A Goal status: INITIAL  2.  Pt will be able to apply multilayer, thigh length, compression wraps using gradient techniques with Max caregiver assistance to decrease limb volume, to limit infection risk, and to limit lymphedema progression.  Baseline: Dependent Goal status: INITIAL  LONG TERM GOALS: Target date: 01/11/23  Given this patient's Intake score of 36.76 % on the Lymphedema Life Impact Scale (LLIS), patient will experience a reduction of at least 5 points in her perceived level of functional impairment resulting from lymphedema to improve functional performance and quality of life (QOL). Baseline: 36.76% Goal status: INITIAL  2.  Given this patient's Intake score of 56/100% on the functional outcomes FOTO tool, patient will experience an increase in function of 3 points to improve basic and instrumental ADLs performance, including lymphedema self-care.  Baseline: 56% Goal status: INITIAL  3.  During Intensive phase CDT Pt will achieve at least 85% compliance with all lymphedema self-care home program components, including  daily skin care, multilayer , gradient compression wraps with daily changes, daily simple self MLD and daily  lymphatic pumping therex to achieve optimal clinical outcome and to habituate self care regime for optimal LE self-management over time. Baseline: Dependent Goal status: INITIAL  4.  Pt will achieve at least a 10% volume reductions bilaterally below the knees to return limb to more typical size and shape, to limit infection risk and LE progression, to decrease pain, to improve function, and to improve body image and QOL. Baseline: Dependent Goal status: INITIAL  5.  Pt will be able to don and doff appropriate compression garments and devices using assistive devices and extra time within 1 week of issue date for optimal lymphedema self-care. Baseline: Dependent Goal status: INITIAL  6.  During Intensive phase CDT Pt will achieve at least 85% compliance with all lymphedema self-care home program components, including  daily skin care, multilayer , gradient compression wraps with daily changes, daily simple self MLD and daily lymphatic pumping therex to achieve optimal clinical outcome and to habituate self care regime for optimal LE self-management over time. Baseline: ax A Goal status: INITIAL   PLAN:  PT FREQUENCY: 2x/week  PT DURATION: 12  weeks other: and PRN  PLANNED INTERVENTIONS: Therapeutic exercises, Therapeutic activity, Patient/Family education, Self Care, DME instructions, Manual lymph drainage, Compression bandaging, Manual therapy, and skin care during MLD, fit with appropriate custom compression garments and devices, fit with appropriate compression device  which  follows lymphatic pathways and anatomic distribution  PLAN FOR NEXT SESSION:  BLE comparative limb volumetrics Initial knee length wrap to limb Pt deems most involved/ painful Pt/ family edu for LE self care   Judithann Sauger, OT 10/14/2022, 12:47 PM

## 2022-10-17 ENCOUNTER — Encounter: Payer: Self-pay | Admitting: Physician Assistant

## 2022-10-17 ENCOUNTER — Ambulatory Visit: Payer: Self-pay | Admitting: Physician Assistant

## 2022-10-17 VITALS — BP 141/98 | HR 81 | Temp 98.0°F | Resp 16

## 2022-10-17 DIAGNOSIS — R053 Chronic cough: Secondary | ICD-10-CM

## 2022-10-17 MED ORDER — FEXOFENADINE-PSEUDOEPHED ER 60-120 MG PO TB12: 1.0000 | ORAL_TABLET | Freq: Two times a day (BID) | ORAL | 0 refills | Status: DC

## 2022-10-17 MED ORDER — BENZONATATE 200 MG PO CAPS: 200.0000 mg | ORAL_CAPSULE | Freq: Three times a day (TID) | ORAL | 0 refills | Status: DC | PRN

## 2022-10-17 MED ORDER — ALBUTEROL SULFATE HFA 108 (90 BASE) MCG/ACT IN AERS: 2.0000 | INHALATION_SPRAY | Freq: Four times a day (QID) | RESPIRATORY_TRACT | 2 refills | Status: AC | PRN

## 2022-10-17 MED ORDER — HYDROCOD POLI-CHLORPHE POLI ER 10-8 MG/5ML PO SUER: 5.0000 mL | Freq: Every evening | ORAL | 0 refills | Status: DC | PRN

## 2022-10-17 NOTE — Progress Notes (Signed)
   Subjective:    Patient ID: Mary [name], female    DOB: 11-01-1960, 62 y.o.   MRN: [id number]  HPI   Patient presents with chief complaint of nocturnal pulmonary wheeze that has been ongoing for 3 days. States she has an accompanying cough, postnasal drainage, & left ear pain. States she wakes up throughout night to cough, wore CPAP prior to bariatric surgery, reports 40lb weight gain since surgery.   Review of Systems Denies fever, chills, or malaise. Denies shortness of breath.     Objective:   Physical Exam VS: BP 141/98, P 81, R 16, T 95f, SpO2 96% HEENT: ear structures intact, erythematous nasal passages, erythematous posterior throat mucosa Lungs: breath sounds clear bilaterally.  Spirometry revealed 85% of predicted value      Assessment & Plan: Cough secondary to bronchospasm    Patient CXR reviewed and found to have low lung volumes. Patient agreeable to PFT study in clinic, results benign. Will refer for sleep study and manage symptoms with cough suppresant, antihistamine/decongestant, & inhaler. Will f/u in 1 week.

## 2022-10-17 NOTE — Progress Notes (Signed)
Day 4 of increased productive cough, dyspnea with exertion, wheezing and feeling warm without documented fever.  Taking PO well.  Has negative covid tests from 4/5 and over the weekend at home.  Ambulated in here without difficulty and talking without dyspnea.  Accompanied by husband.  Taking Allegra D reported with Tessalon Pearls roating with Nyquil and Tylenol.

## 2022-10-18 ENCOUNTER — Encounter: Payer: 59 | Admitting: Physical Therapy

## 2022-10-20 ENCOUNTER — Telehealth: Payer: Self-pay

## 2022-10-20 ENCOUNTER — Encounter: Payer: 59 | Admitting: Physical Therapy

## 2022-10-20 DIAGNOSIS — R053 Chronic cough: Secondary | ICD-10-CM

## 2022-10-20 NOTE — Telephone Encounter (Signed)
Kristine called [facility] today as previously instructed.  Nona Dell, PA-C reviewed Chest xray results with her. Decided on pulmonary consults since S/Sx are not improving.  Pulmonary consult with Dr. Belia Heman put in Epic.  AMD

## 2022-10-23 IMAGING — CR DG CHEST 2V
1 series · 2 of 2 positions shown · non-contrast
Comparison: 07/01/2013.  Chest CT, 06/24/2014.

CLINICAL DATA: Pt states bronchitis, productive cough, SOB chest
pain,fever and chills for 1 week. Pt had negative COVID and flu test
on [REDACTED]. History of HTN, former smoker, heart murmur.

EXAM:
CHEST - 2 VIEW

[Series 1: dg chest 2 view · 0.14mm/px · 2 of 2 slices shown]
[im 1/2]
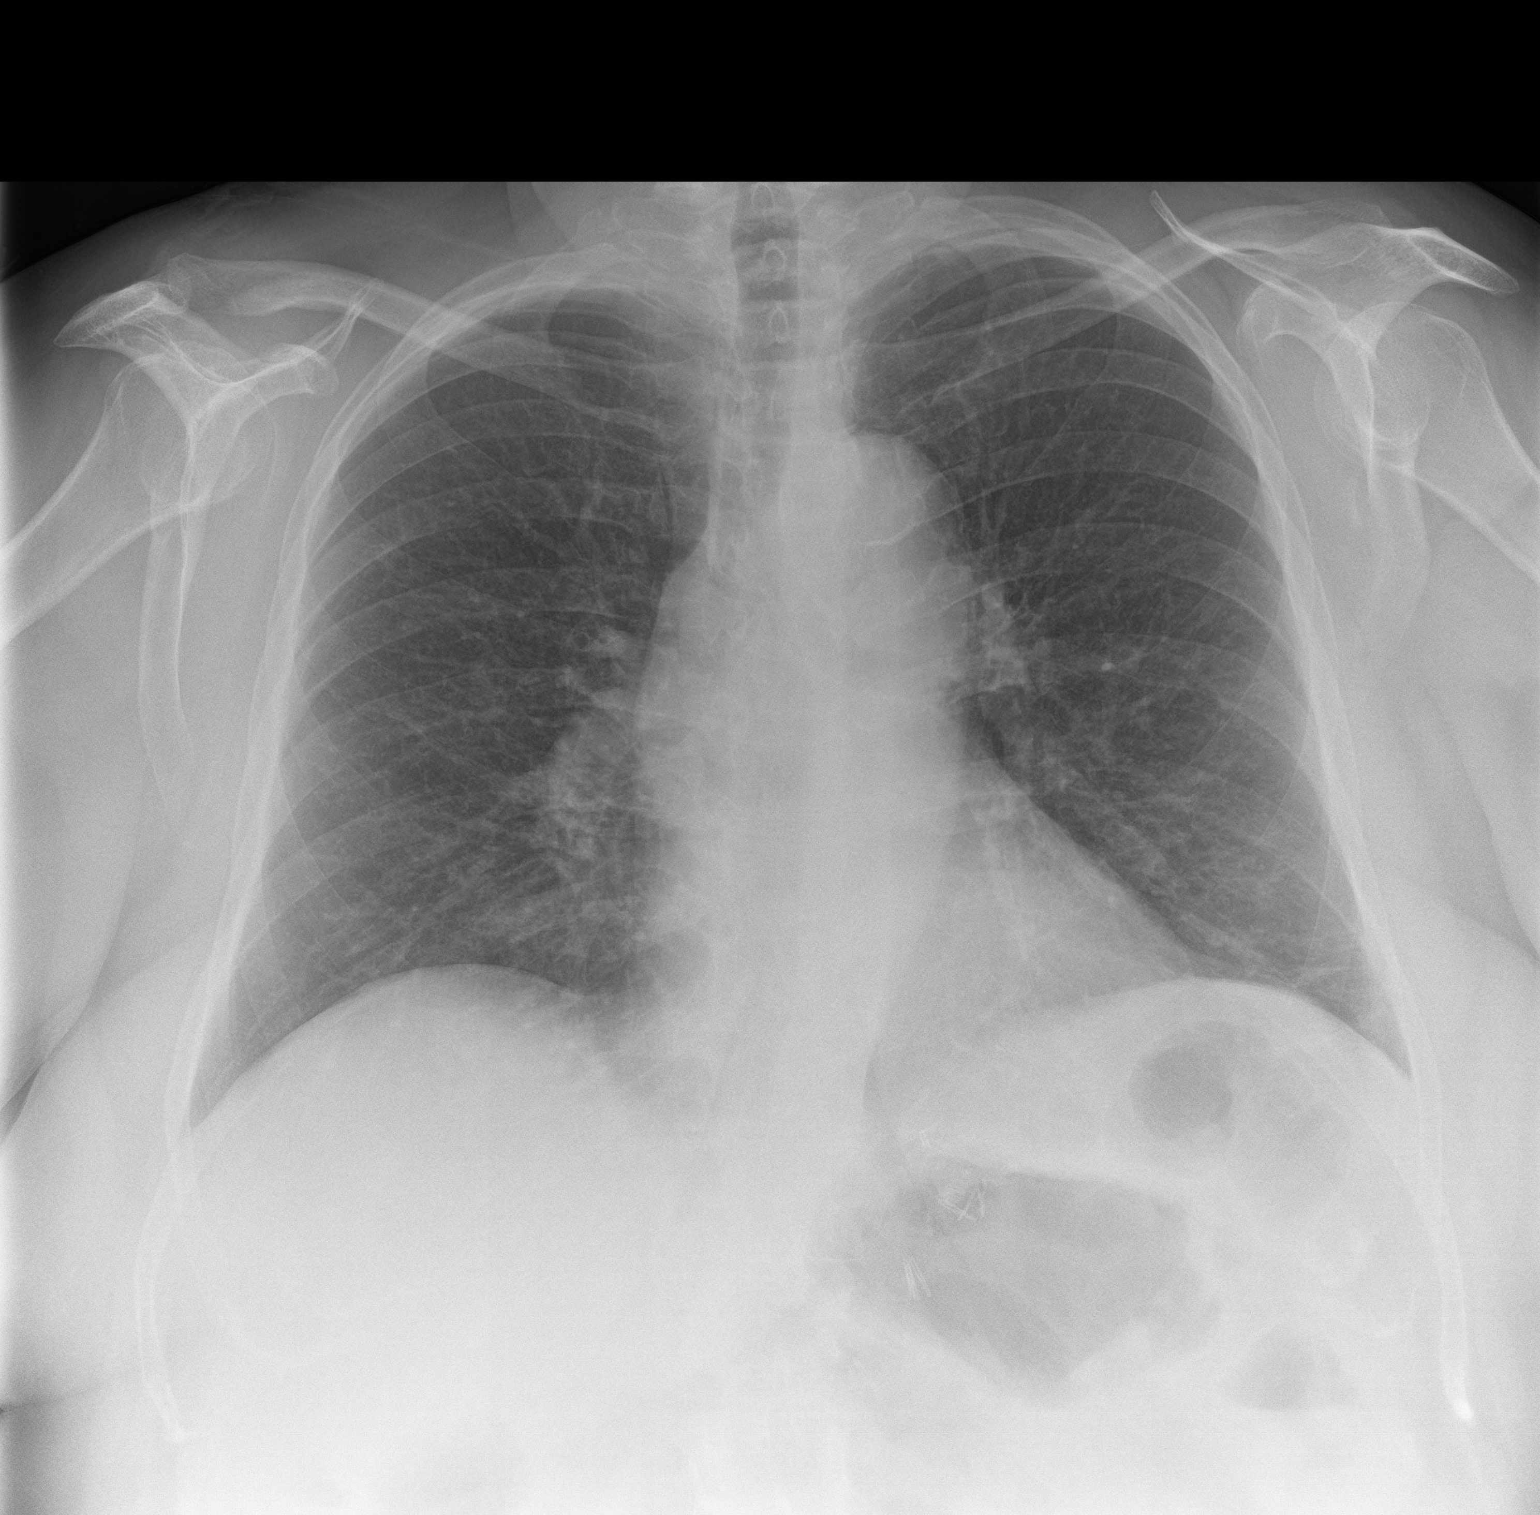
[im 2/2]
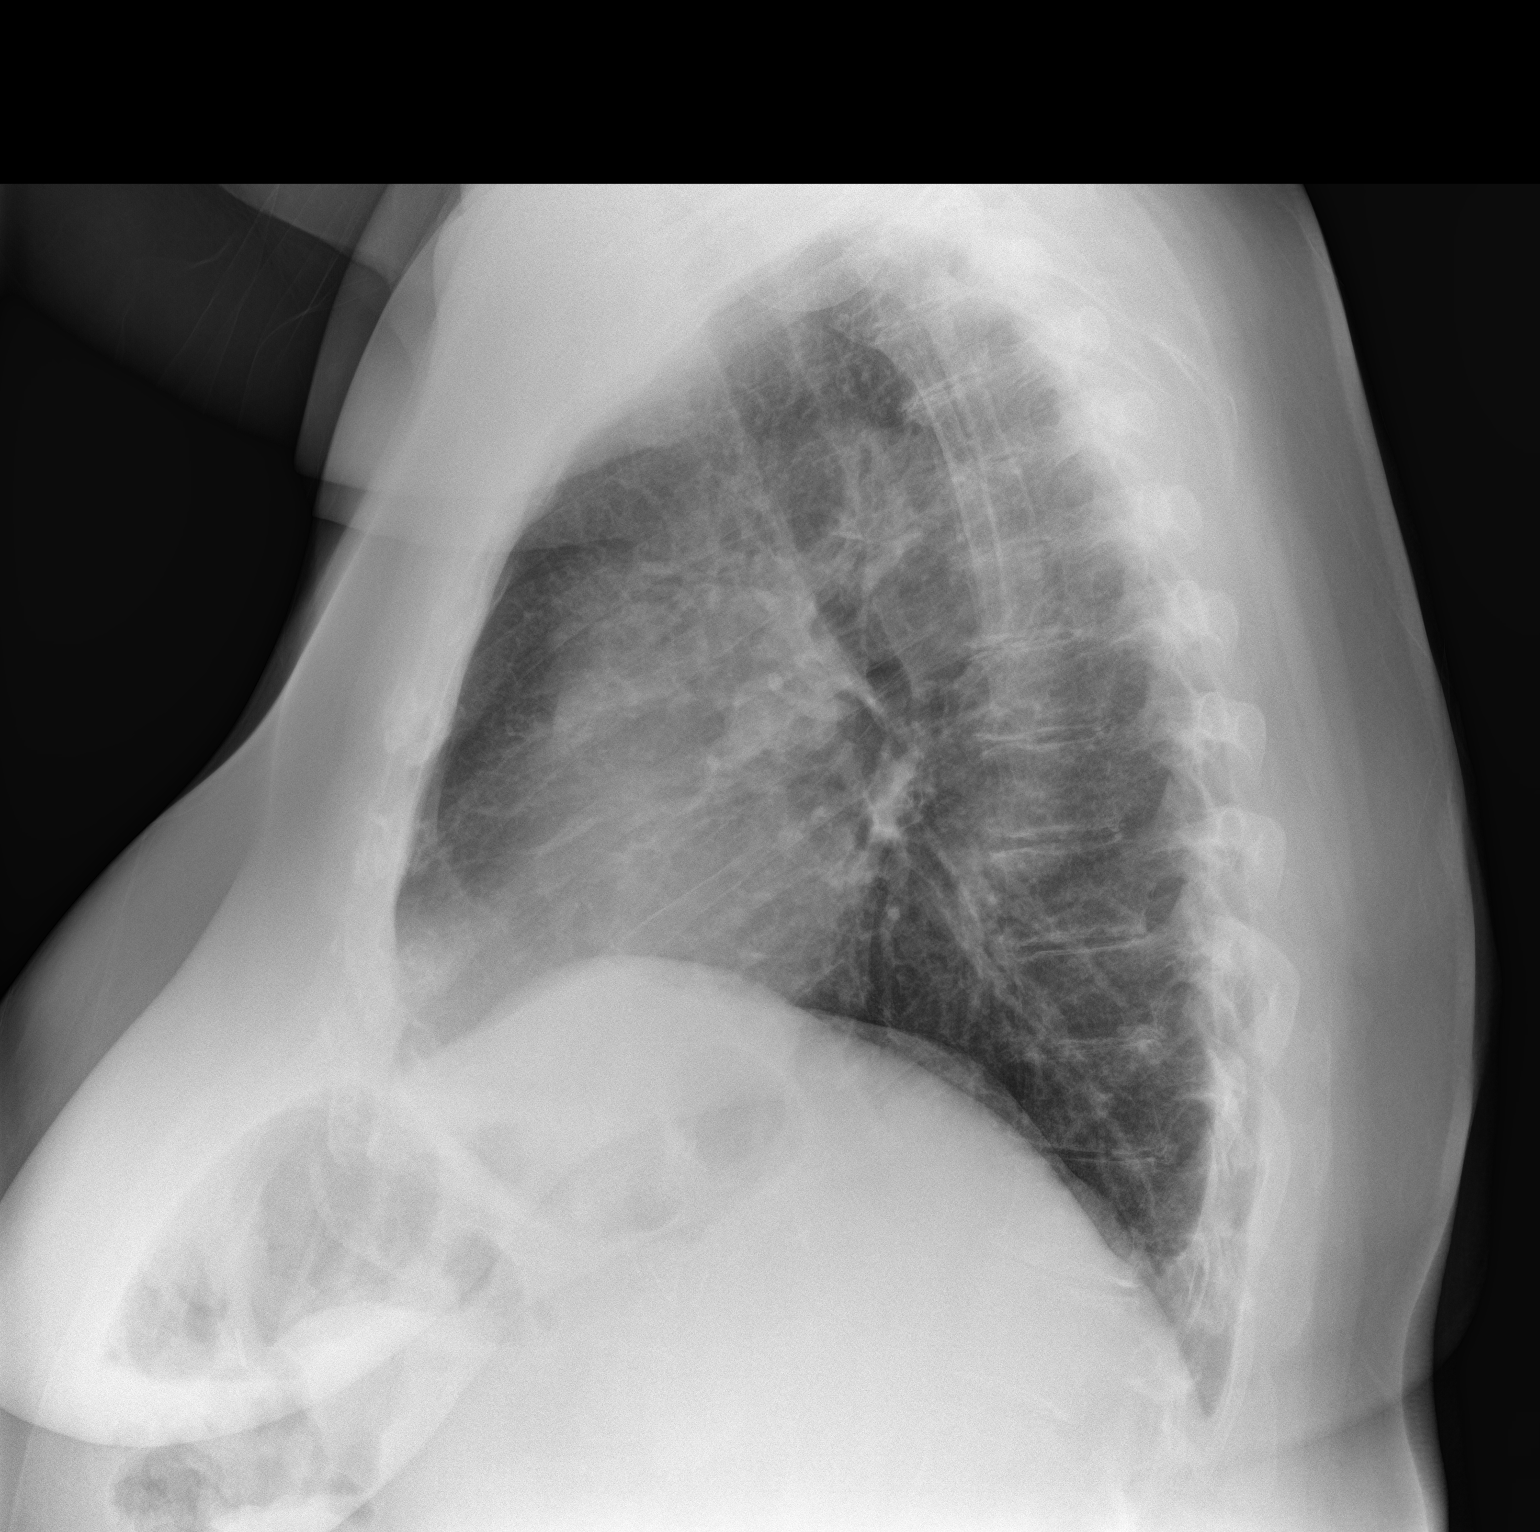

[2 of 2 positions shown; findings below may reference images not displayed]

FINDINGS: The cardiac silhouette is normal in size and configuration no
mediastinal or hilar masses or evidence of adenopathy. Mild linear
opacities noted at the left lateral lung base consistent atelectasis
or scarring. Lungs otherwise clear.

No pleural effusion or pneumothorax.

Skeletal structures are intact.
IMPRESSION: No active cardiopulmonary disease.

## 2022-10-25 ENCOUNTER — Encounter: Payer: 59 | Admitting: Physical Therapy

## 2022-10-25 ENCOUNTER — Ambulatory Visit: Payer: 59 | Admitting: Occupational Therapy

## 2022-10-26 ENCOUNTER — Other Ambulatory Visit: Payer: Self-pay | Admitting: Physician Assistant

## 2022-10-26 DIAGNOSIS — R053 Chronic cough: Secondary | ICD-10-CM

## 2022-10-26 MED ORDER — METHYLPREDNISOLONE 4 MG PO TBPK: ORAL_TABLET | ORAL | 0 refills | Status: DC

## 2022-10-26 NOTE — Progress Notes (Signed)
   Subjective: Chronic cough    Patient ID: Mary [name], female    DOB: 1961/03/18, 62 y.o.   MRN: [id number]  HPI Patient called today to discuss no resolution for chronic cough and nasal congestion.  Patient is currently taking Allegra-D and Tessalon Perles.  Patient has a consult to pulmonology secondary to low lung volume on x-rays.  She has been sick ever since diagnosis of COVID in January 2024.   Review of Systems Hypertension, hypothyroidism, and obstructive sleep apnea,    Objective:   Physical Exam  Deferred secondary to this being a telephonic encounter.      Assessment & Plan: Chronic cough   Patient is scheduled to see a pulmonologist in 5 days.  Advised over-the-counter Mucinex 600 mg twice daily along with a Medrol Dosepak.

## 2022-10-27 ENCOUNTER — Encounter: Payer: 59 | Admitting: Occupational Therapy

## 2022-10-27 ENCOUNTER — Encounter: Payer: 59 | Admitting: Physical Therapy

## 2022-10-31 ENCOUNTER — Ambulatory Visit (INDEPENDENT_AMBULATORY_CARE_PROVIDER_SITE_OTHER): Payer: 59 | Admitting: Student in an Organized Health Care Education/Training Program

## 2022-10-31 ENCOUNTER — Encounter: Payer: Self-pay | Admitting: Student in an Organized Health Care Education/Training Program

## 2022-10-31 VITALS — BP 138/82 | HR 75 | Temp 97.8°F | Ht 66.5 in | Wt 270.6 lb

## 2022-10-31 DIAGNOSIS — R053 Chronic cough: Secondary | ICD-10-CM

## 2022-10-31 MED ORDER — FLUTICASONE PROPIONATE 50 MCG/ACT NA SUSP
1.0000 | Freq: Two times a day (BID) | NASAL | 11 refills | Status: DC
Start: 2022-10-31 — End: 2023-12-11

## 2022-10-31 MED ORDER — LORATADINE 10 MG PO TABS
10.0000 mg | ORAL_TABLET | Freq: Every day | ORAL | 11 refills | Status: DC
Start: 2022-10-31 — End: 2023-12-07

## 2022-10-31 MED ORDER — SALINE SPRAY 0.65 % NA SOLN
2.0000 | Freq: Two times a day (BID) | NASAL | 6 refills | Status: DC
Start: 2022-10-31 — End: 2023-12-11

## 2022-10-31 NOTE — Progress Notes (Signed)
Synopsis: Referred in for cough by Joni Reining, PA-C  Assessment & Plan:   1. Chronic cough  Presents today for the evaluation of sub-acute cough since February following a viral illness. Physical exam is notable for inflamed nasal turbinates with mucus, and clear lungs on auscultation. History suggests a combination of upper airway cough syndrome with a possible contribution from reflux given worsening symptoms at night.  I will start her on loratadine in place of allegra, and augment treatment with nasal washes (saline washes twice daily) and intranasal corticosteroids. I will also obtain a swallowing study to assess for reflux disease.  Plan for short term follow up in 2 to 3 weeks to assess for response to therapy. Should she continue to have a cough, I will consider obtaining further chest imaging, a pulmonary function test, and possibly consider bronchoscopy.  - DG ESOPHAGUS W DOUBLE CM (HD); Future - loratadine (CLARITIN) 10 MG tablet; Take 1 tablet (10 mg total) by mouth daily.  Dispense: 30 tablet; Refill: 11 - sodium chloride (OCEAN) 0.65 % SOLN nasal spray; Place 2 sprays into both nostrils in the morning and at bedtime.  Dispense: 480 mL; Refill: 6 - fluticasone (FLONASE) 50 MCG/ACT nasal spray; Place 1 spray into both nostrils in the morning and at bedtime.  Dispense: 18.2 mL; Refill: 11   Return in about 2 weeks (around 11/14/2022).  I spent 60 minutes caring for this patient today, including preparing to see the patient, obtaining a medical history , reviewing a separately obtained history, performing a medically appropriate examination and/or evaluation, counseling and educating the patient/family/caregiver, ordering medications, tests, or procedures, and documenting clinical information in the electronic health record  Raechel Chute, MD [facility] 10/31/2022 9:21 AM    End of visit medications:  Meds ordered this encounter  Medications    loratadine (CLARITIN) 10 MG tablet    Sig: Take 1 tablet (10 mg total) by mouth daily.    Dispense:  30 tablet    Refill:  11   sodium chloride (OCEAN) 0.65 % SOLN nasal spray    Sig: Place 2 sprays into both nostrils in the morning and at bedtime.    Dispense:  480 mL    Refill:  6   fluticasone (FLONASE) 50 MCG/ACT nasal spray    Sig: Place 1 spray into both nostrils in the morning and at bedtime.    Dispense:  18.2 mL    Refill:  11     Current Outpatient Medications:    albuterol (VENTOLIN HFA) 108 (90 Base) MCG/ACT inhaler, Inhale 2 puffs into the lungs every 6 (six) hours as needed for wheezing or shortness of breath., Disp: 1 each, Rfl: 2   benzonatate (TESSALON) 200 MG capsule, Take 1 capsule (200 mg total) by mouth 3 (three) times daily as needed for cough., Disp: 30 capsule, Rfl: 0   chlorpheniramine-HYDROcodone (TUSSIONEX) 10-8 MG/5ML, Take 5 mLs by mouth at bedtime as needed for cough., Disp: 75 mL, Rfl: 0   Continuous Blood Gluc Receiver (DEXCOM G7 RECEIVER) DEVI, Use to check glucose as directed, Disp: 1 each, Rfl: 0   Continuous Blood Gluc Sensor (DEXCOM G7 SENSOR) MISC, Change sensor every 10 days, Disp: 3 each, Rfl: 2   cyanocobalamin (,VITAMIN B-12,) 1000 MCG/ML injection, Inject 1 mL (1,000 mcg total) into the muscle every 14 (fourteen) days., Disp: 10 mL, Rfl: 2   EPINEPHrine 0.3 mg/0.3 mL IJ SOAJ injection, Inject 0.3 mLs into the muscle as needed., Disp: ,  Rfl:    fluticasone (FLONASE) 50 MCG/ACT nasal spray, Place 1 spray into both nostrils in the morning and at bedtime., Disp: 18.2 mL, Rfl: 11   hydrochlorothiazide (HYDRODIURIL) 25 MG tablet, Take 1 tablet (25 mg total) by mouth daily., Disp: 90 tablet, Rfl: 3   levothyroxine (SYNTHROID) 100 MCG tablet, Take 1 tablet (100 mcg total) by mouth daily before breakfast., Disp: 90 tablet, Rfl: 3   loratadine (CLARITIN) 10 MG tablet, Take 1 tablet (10 mg total) by mouth daily., Disp: 30 tablet, Rfl: 11   meloxicam (MOBIC)  15 MG tablet, Take by mouth daily as needed for pain., Disp: , Rfl:    methylPREDNISolone (MEDROL DOSEPAK) 4 MG TBPK tablet, Take Tapered dose as directed, Disp: 21 tablet, Rfl: 0   phentermine 37.5 MG capsule, Take 1 capsule (37.5 mg total) by mouth every morning., Disp: 30 capsule, Rfl: 0   promethazine-dextromethorphan (PROMETHAZINE-DM) 6.25-15 MG/5ML syrup, Take 5 mLs by mouth 4 (four) times daily as needed for cough., Disp: 118 mL, Rfl: 0   simvastatin (ZOCOR) 40 MG tablet, Take 1 tablet (40 mg total) by mouth at bedtime., Disp: 90 tablet, Rfl: 3   sodium chloride (OCEAN) 0.65 % SOLN nasal spray, Place 2 sprays into both nostrils in the morning and at bedtime., Disp: 480 mL, Rfl: 6   Vitamin D, Ergocalciferol, (DRISDOL) 1.25 MG (50000 UNIT) CAPS capsule, Take 1 capsule (50,000 Units total) by mouth every 7 (seven) days., Disp: 5 capsule, Rfl: 5   zolpidem (AMBIEN CR) 12.5 MG CR tablet, Take 1 tablet (12.5 mg total) by mouth at bedtime as needed for sleep., Disp: 30 tablet, Rfl: 5   Subjective:   PATIENT ID: Mary [name] GENDER: female DOB: 12-13-1960, MRN: [id number]  Chief Complaint  Patient presents with   pulmonary consult    Covid 08/2022--lingering prod cough with clear sputum and wheezing.     HPI  Patient is a pleasant 62 year old female past medical history of hypothyroidism and Roux-en-Y bypass who presents to clinic today with a chief complaint of cough since February of 2024.  Patient developed COVID in February 2024 (first time she's had it) after which she developed a cough that has been persistent.  She was treated for COVID with Paxlovid with improvement.  A few weeks later, her symptoms recurred and she developed a sore throat and stuffy nose with an associated cough that has persisted since then.  The cough is productive of whitish sputum, sometimes (rarely) yellow sputum.  The cough occurs throughout the day and is worse at night.  She does wake up at night from her  cough.  The cough gets worse whenever she goes to bed, and she feels it is worse when she lays on her left side. There have been no associated fevers or chills with this cough, no night sweats, and no weight loss.  There has been no associated chest pain or chest tightness, but she did report some wheezing (squeaks) last week.  She has been followed by her primary care provider and multiple remedies have had partial success.  She has received a course of Bactrim which caused diarrhea 4 days into it prompting her to stop.  She has received Mucinex, Allegra, Tessalon Perles, and albuterol.  She is currently on a Medrol Dosepak.  She felt some improvement in her cough with the Medrol Dosepak and feels that the Allegra helps but wears off after 12 hours.  She has never had a cough in the past and  has never experienced allergies.  There is no family history or personal history of asthma.    Patient has a very distant smoking history, smoking for 3 years very lightly and quit in 2001.  She is not exposed to secondhand smoke.  She does not have any pets nor any birds.  She works as a IT sales professional.  Ancillary information including prior medications, full medical/surgical/family/social histories, and PFTs (when available) are listed below and have been reviewed.   Review of Systems  Constitutional:  Negative for chills, fever and weight loss.  Respiratory:  Positive for cough, sputum production and wheezing. Negative for hemoptysis and shortness of breath.   Cardiovascular:  Negative for chest pain.  Skin:  Negative for rash.     Objective:   Vitals:   10/31/22 0842  BP: 138/82  Pulse: 75  Temp: 97.8 F (36.6 C)  TempSrc: Temporal  SpO2: 99%  Weight: 270 lb 9.6 oz (122.7 kg)  Height: 5' 6.5" (1.689 m)   99% on RA BMI Readings from Last 3 Encounters:  10/31/22 43.02 kg/m  09/20/22 43.56 kg/m  08/16/22 43.24 kg/m   Wt Readings from Last 3 Encounters:  10/31/22 270 lb 9.6 oz (122.7  kg)  09/20/22 274 lb (124.3 kg)  08/16/22 272 lb (123.4 kg)    Physical Exam Constitutional:      Appearance: She is obese.  HENT:     Head: Normocephalic.     Nose: Congestion present.     Mouth/Throat:     Mouth: Mucous membranes are moist.  Cardiovascular:     Rate and Rhythm: Normal rate and regular rhythm.     Heart sounds: Normal heart sounds.  Pulmonary:     Effort: Pulmonary effort is normal.     Breath sounds: Normal breath sounds.  Abdominal:     Palpations: Abdomen is soft.  Neurological:     General: No focal deficit present.     Mental Status: She is alert and oriented to person, place, and time. Mental status is at baseline.       Ancillary Information    Past Medical History:  Diagnosis Date   Anemia    low iron at times   Arthritis    Colon polyps    Complication of anesthesia 1991   Pt reports she awoke during plantar fascia surgery and felt "everything"   Heart murmur    " small per patient"    Hyperlipidemia    Hypertension    Hypothyroidism    Lactose intolerance    PAC (premature atrial contraction)    Palpitations    Panic attacks    Pt reports restrictive clothing/areas and dry mouth cause her to have attack.   Peripheral vascular disease    "small" clot after thermal ablasion   Pneumonia 2016   "walking pneumonia" 3 times in 2016   Shortness of breath    going upstairs (due to weight). not so much since weight loss   Sleep apnea    after weight loss does not have to use cpap   Vitamin B12 deficiency    Vitamin D deficiency      Family History  Problem Relation Age of Onset   Heart disease Mother    Lung cancer Mother    Diabetes Mother        later in life   Coronary artery disease Mother    Osteoporosis Mother    Diabetes Father    Heart disease Father  Arrhythmia Sister    Breast cancer Sister 4       diagnosed 09/09/2016   Colonic polyp Brother        pre cancer     Past Surgical History:  Procedure Laterality  Date   APPENDECTOMY  1985   CESAREAN SECTION  1987   twins   COLONOSCOPY     COLONOSCOPY WITH PROPOFOL N/A 02/27/2018   Procedure: COLONOSCOPY WITH PROPOFOL;  Surgeon: Toledo, Boykin Nearing, MD;  Location: [facility];  Service: Gastroenterology;  Laterality: N/A;   IRRIGATION AND DEBRIDEMENT HEMATOMA Right 11/12/2015   Procedure: IRRIGATION AND DEBRIDEMENT HEMATOMA;  Surgeon: Earline Mayotte, MD;  Location: [facility];  Service: General;  Laterality: Right;   KNEE ARTHROSCOPY WITH MEDIAL MENISECTOMY Left 07/15/2020   Procedure: Left knee arthroscopy with partial medial or lateral menisectomy, possible chondroplasty, possible partial synovectomy;  Surgeon: Lyndle Herrlich, MD;  Location: [facility];  Service: Orthopedics;  Laterality: Left;   LAMINECTOMY  07/10/2013   L4   L5   DR Yetta Barre   LAPAROSCOPIC SALPINGO OOPHERECTOMY Right 04/17/2014   Procedure: LAPAROSCOPIC SALPINGO OOPHORECTOMY RIGHT;  Surgeon: Adolphus Birchwood, MD;  Location: [facility];  Service: Gynecology;  Laterality: Right;   LYSIS OF ADHESION  04/17/2014   Procedure: LYSIS OF ADHESION;  Surgeon: Adolphus Birchwood, MD;  Location: [facility];  Service: Gynecology;;   MAXIMUM ACCESS (MAS)POSTERIOR LUMBAR INTERBODY FUSION (PLIF) 1 LEVEL  07/10/2013   Procedure: FOR MAXIMUM ACCESS (MAS) POSTERIOR LUMBAR INTERBODY FUSION (PLIF) LUMBAR FOUR-FIVE;  Surgeon: Tia Alert, MD;  Location: [facility];  Service: Neurosurgery;;  FOR MAXIMUM ACCESS (MAS) POSTERIOR LUMBAR INTERBODY FUSION (PLIF) LUMBAR FOUR-FIVE   OOPHORECTOMY     LSO   OPEN REDUCTION INTERNAL FIXATION (ORIF) DISTAL RADIAL FRACTURE Left 09/20/2018   Procedure: OPEN REDUCTION INTERNAL FIXATION (ORIF) DISTAL RADIAL FRACTURE, LEFT;  Surgeon: Kennedy Bucker, MD;  Location: [facility];  Service: Orthopedics;  Laterality: Left;   PLANTAR FASCIA SURGERY  1999   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   VAGINAL HYSTERECTOMY  2001   for bleeding and LSO for cyst    Social History   Socioeconomic History   Marital  status: Married    Spouse name: Not on file   Number of children: 2   Years of education: Not on file   Highest education level: Not on file  Occupational History   Occupation: parking Counselling psychologist: [employer]  Tobacco Use   Smoking status: Former    Packs/day: 0.30    Years: 4.00    Additional pack years: 0.00    Total pack years: 1.20    Types: Cigarettes    Quit date: 07/11/2002    Years since quitting: 20.3    Passive exposure: Never   Smokeless tobacco: Never  Vaping Use   Vaping Use: Never used  Substance and Sexual Activity   Alcohol use: Not Currently    Alcohol/week: 1.0 standard drink of alcohol    Types: 1 Standard drinks or equivalent per week   Drug use: No   Sexual activity: Yes    Birth control/protection: Surgical    Comment: HYST  Other Topics Concern   Not on file  Social History Narrative   Not on file   Social Determinants of Health   Financial Resource Strain: Not on file  Food Insecurity: Not on file  Transportation Needs: Not on file  Physical Activity: Not on file  Stress: Not on file  Social  Connections: Not on file  Intimate Partner Violence: Not on file     Allergies  Allergen Reactions   Bee Venom Anaphylaxis   Amoxicillin    Citalopram Nausea Only   Lactose Intolerance (Gi) Diarrhea    Bloating, upset stomach   Other    Tape Other (See Comments)    Thin skin, rips off skin     CBC    Component Value Date/Time   WBC 4.8 06/16/2022 0819   WBC 7.0 07/13/2020 1115   RBC 4.99 06/16/2022 0819   RBC 4.71 07/13/2020 1115   HGB 13.8 06/16/2022 0819   HCT 40.7 06/16/2022 0819   PLT 178 06/16/2022 0819   MCV 82 06/16/2022 0819   MCV 77 (L) 11/09/2013 1156   MCH 27.7 06/16/2022 0819   MCH 28.0 07/13/2020 1115   MCHC 33.9 06/16/2022 0819   MCHC 32.3 07/13/2020 1115   RDW 14.8 06/16/2022 0819   RDW 17.7 (H) 11/09/2013 1156   LYMPHSABS 1.3 06/16/2022 0819   LYMPHSABS 2.5 11/09/2013 1156   MONOABS 0.5  11/12/2015 1056   MONOABS 1.1 (H) 11/09/2013 1156   EOSABS 0.1 06/16/2022 0819   EOSABS 0.1 11/09/2013 1156   BASOSABS 0.1 06/16/2022 0819   BASOSABS 0.1 11/09/2013 1156    Pulmonary Functions Testing Results:     No data to display          Outpatient Medications Prior to Visit  Medication Sig Dispense Refill   albuterol (VENTOLIN HFA) 108 (90 Base) MCG/ACT inhaler Inhale 2 puffs into the lungs every 6 (six) hours as needed for wheezing or shortness of breath. 1 each 2   benzonatate (TESSALON) 200 MG capsule Take 1 capsule (200 mg total) by mouth 3 (three) times daily as needed for cough. 30 capsule 0   chlorpheniramine-HYDROcodone (TUSSIONEX) 10-8 MG/5ML Take 5 mLs by mouth at bedtime as needed for cough. 75 mL 0   Continuous Blood Gluc Receiver (DEXCOM G7 RECEIVER) DEVI Use to check glucose as directed 1 each 0   Continuous Blood Gluc Sensor (DEXCOM G7 SENSOR) MISC Change sensor every 10 days 3 each 2   cyanocobalamin (,VITAMIN B-12,) 1000 MCG/ML injection Inject 1 mL (1,000 mcg total) into the muscle every 14 (fourteen) days. 10 mL 2   EPINEPHrine 0.3 mg/0.3 mL IJ SOAJ injection Inject 0.3 mLs into the muscle as needed.     hydrochlorothiazide (HYDRODIURIL) 25 MG tablet Take 1 tablet (25 mg total) by mouth daily. 90 tablet 3   levothyroxine (SYNTHROID) 100 MCG tablet Take 1 tablet (100 mcg total) by mouth daily before breakfast. 90 tablet 3   meloxicam (MOBIC) 15 MG tablet Take by mouth daily as needed for pain.     methylPREDNISolone (MEDROL DOSEPAK) 4 MG TBPK tablet Take Tapered dose as directed 21 tablet 0   phentermine 37.5 MG capsule Take 1 capsule (37.5 mg total) by mouth every morning. 30 capsule 0   promethazine-dextromethorphan (PROMETHAZINE-DM) 6.25-15 MG/5ML syrup Take 5 mLs by mouth 4 (four) times daily as needed for cough. 118 mL 0   simvastatin (ZOCOR) 40 MG tablet Take 1 tablet (40 mg total) by mouth at bedtime. 90 tablet 3   Vitamin D, Ergocalciferol, (DRISDOL)  1.25 MG (50000 UNIT) CAPS capsule Take 1 capsule (50,000 Units total) by mouth every 7 (seven) days. 5 capsule 5   zolpidem (AMBIEN CR) 12.5 MG CR tablet Take 1 tablet (12.5 mg total) by mouth at bedtime as needed for sleep. 30 tablet 5   fexofenadine-pseudoephedrine (  ALLEGRA-D) 60-120 MG 12 hr tablet Take 1 tablet by mouth 2 (two) times daily. 30 tablet 0   diphenoxylate-atropine (LOMOTIL) 2.5-0.025 MG tablet Take 1 tablet by mouth 4 (four) times daily as needed for diarrhea or loose stools. First dose should consist of 2 tablets, followed by 1 tablet every 6 hours. (Patient not taking: Reported on 09/20/2022) 12 tablet 0   diphenoxylate-atropine (LOMOTIL) 2.5-0.025 MG tablet Take 1 tablet by mouth 4 (four) times daily as needed for diarrhea or loose stools. Take 2 tablets at once.  Then 1 tablet post loose stool bowel movement.  Max of 8 tablets/day.  Repeat on day 2.  On day take 3, tablet q. BM again for max 8/day. (Patient not taking: Reported on 10/17/2022) 30 tablet 0   No facility-administered medications prior to visit.

## 2022-11-01 ENCOUNTER — Encounter: Payer: 59 | Admitting: Physical Therapy

## 2022-11-01 ENCOUNTER — Ambulatory Visit: Payer: 59 | Admitting: Occupational Therapy

## 2022-11-01 DIAGNOSIS — I890 Lymphedema, not elsewhere classified: Secondary | ICD-10-CM

## 2022-11-02 ENCOUNTER — Ambulatory Visit
Admission: RE | Admit: 2022-11-02 | Discharge: 2022-11-02 | Disposition: A | Discharge status: Home / Self Care | Payer: 59 | Source: Ambulatory Visit | Attending: Student in an Organized Health Care Education/Training Program | Admitting: Student in an Organized Health Care Education/Training Program

## 2022-11-02 DIAGNOSIS — R053 Chronic cough: Secondary | ICD-10-CM | POA: Insufficient documentation

## 2022-11-02 NOTE — Therapy (Signed)
OUTPATIENT OCCUPATIONAL THERAPY LOWER EXTREMITY LYMPHEDEMA EVALUATION  Patient Name: Mary [name] MRN: [id number] DOB:06-24-1961, 62 y.o., female Today's Date: 11/02/2022  END OF SESSION:  OT End of Session - 11/01/22 1424     Visit Number 2    Number of Visits 36    Date for OT Re-Evaluation 01/11/23    OT Start Time 0205    OT Stop Time 0310    OT Time Calculation (min) 65 min    Activity Tolerance Patient tolerated treatment well;No increased pain    Behavior During Therapy WFL for tasks assessed/performed               Past Medical History:  Diagnosis Date   Anemia    low iron at times   Arthritis    Colon polyps    Complication of anesthesia 1991   Pt reports she awoke during plantar fascia surgery and felt "everything"   Heart murmur    " small per patient"    Hyperlipidemia    Hypertension    Hypothyroidism    Lactose intolerance    PAC (premature atrial contraction)    Palpitations    Panic attacks    Pt reports restrictive clothing/areas and dry mouth cause her to have attack.   Peripheral vascular disease    "small" clot after thermal ablasion   Pneumonia 2016   "walking pneumonia" 3 times in 2016   Shortness of breath    going upstairs (due to weight). not so much since weight loss   Sleep apnea    after weight loss does not have to use cpap   Vitamin B12 deficiency    Vitamin D deficiency    Past Surgical History:  Procedure Laterality Date   APPENDECTOMY  1985   CESAREAN SECTION  1987   twins   COLONOSCOPY     COLONOSCOPY WITH PROPOFOL N/A 02/27/2018   Procedure: COLONOSCOPY WITH PROPOFOL;  Surgeon: Toledo, Boykin Nearing, MD;  Location: [facility];  Service: Gastroenterology;  Laterality: N/A;   IRRIGATION AND DEBRIDEMENT HEMATOMA Right 11/12/2015   Procedure: IRRIGATION AND DEBRIDEMENT HEMATOMA;  Surgeon: Earline Mayotte, MD;  Location: [facility];  Service: General;  Laterality: Right;   KNEE ARTHROSCOPY WITH MEDIAL MENISECTOMY Left  07/15/2020   Procedure: Left knee arthroscopy with partial medial or lateral menisectomy, possible chondroplasty, possible partial synovectomy;  Surgeon: Lyndle Herrlich, MD;  Location: [facility];  Service: Orthopedics;  Laterality: Left;   LAMINECTOMY  07/10/2013   L4   L5   DR Yetta Barre   LAPAROSCOPIC SALPINGO OOPHERECTOMY Right 04/17/2014   Procedure: LAPAROSCOPIC SALPINGO OOPHORECTOMY RIGHT;  Surgeon: Adolphus Birchwood, MD;  Location: [facility];  Service: Gynecology;  Laterality: Right;   LYSIS OF ADHESION  04/17/2014   Procedure: LYSIS OF ADHESION;  Surgeon: Adolphus Birchwood, MD;  Location: [facility];  Service: Gynecology;;   MAXIMUM ACCESS (MAS)POSTERIOR LUMBAR INTERBODY FUSION (PLIF) 1 LEVEL  07/10/2013   Procedure: FOR MAXIMUM ACCESS (MAS) POSTERIOR LUMBAR INTERBODY FUSION (PLIF) LUMBAR FOUR-FIVE;  Surgeon: Tia Alert, MD;  Location: [facility];  Service: Neurosurgery;;  FOR MAXIMUM ACCESS (MAS) POSTERIOR LUMBAR INTERBODY FUSION (PLIF) LUMBAR FOUR-FIVE   OOPHORECTOMY     LSO   OPEN REDUCTION INTERNAL FIXATION (ORIF) DISTAL RADIAL FRACTURE Left 09/20/2018   Procedure: OPEN REDUCTION INTERNAL FIXATION (ORIF) DISTAL RADIAL FRACTURE, LEFT;  Surgeon: Kennedy Bucker, MD;  Location: [facility];  Service: Orthopedics;  Laterality: Left;   PLANTAR FASCIA SURGERY  1999   ROUX-EN-Y GASTRIC  BYPASS  06/2006   VAGINAL HYSTERECTOMY  2001   for bleeding and LSO for cyst   Patient Active Problem List   Diagnosis Date Noted   Reactive hypoglycemia 02/16/2022   Prediabetes 02/16/2022   Chronic venous insufficiency 12/23/2021   Lymphedema 12/13/2021   Panic attacks    Strain of knee 12/24/2019   Vitamin B12 deficiency 10/08/2019   Arthritis 07/16/2019   Edema 07/16/2019   History of abdominal pain 07/16/2019   Low back pain 07/16/2019   Vitamin D deficiency 07/16/2019   Hypothyroidism 02/28/2019   History of gastric bypass 02/28/2019   Obstructive sleep apnea syndrome 02/28/2019   Insomnia 02/28/2019   Class 3 severe  obesity due to excess calories with serious comorbidity and body mass index (BMI) of 40.0 to 44.9 in adult 02/28/2019   Elevated BP without diagnosis of hypertension 02/28/2019   Mixed hyperlipidemia 02/28/2019   Hot flashes 02/28/2019   BMI 40.0-44.9, adult 12/16/2015   Hematoma of lower extremity 11/12/2015   Essential hypertension 06/20/2014   Pure hypercholesterolemia 06/20/2014   S/P lumbar spinal fusion 07/10/2013   Arthrodesis status 07/10/2013   Palpitations 01/02/2012   Edema of both legs 01/02/2012    PCP: Joni Reining, PA-C  REFERRING PROVIDER: Foster Simpson, DO  REFERRING DIAG: I89.0  THERAPY DIAG:  Lymphedema, not elsewhere classified  Rationale for Evaluation and Treatment: Rehabilitation  ONSET DATE: Insidious onset with progression over time. Greater than 20 years  SUBJECTIVE:                                                                                                                                                                                           SUBJECTIVE STATEMENT:Mary [name] presents for initial OT visit to address BLE lipo-lymphedema. Pt is accompanied by her supportive spouse, Mary [name]. Pt states she is a bit nervous about compression bandaging today. Pt has no new complaints since initial eval last week.Lymphedema-related pain and swelling are unchanged.  PERTINENT HISTORY and contributing factors include, OA, HTN, Panic attacks 2/2 restrictive clothing, OSA (not using CPAP), s/p hysterectomy 1987, L knee sx, oophorectomy, s/p hysterectomy 2001, Gastric Bypass 2007, s/p plantar fascia sx 1999, obesity, insomnia  PAIN:  Are you having pain? Yes: NPRS scale: 6/10 Pain location: BLE- generalized, knees Pain description: discomfort, heavy, full, sore Aggravating factors: standing, walking, extended dependent sitting Relieving factors: elevation  PRECAUTIONS: Other: LYMPHEDEMA PRECAUTIONS: HYPOTHYROID, DIABETES SKIN PRECAUTIONS  WEIGHT  BEARING RESTRICTIONS: No  FALLS:  Has patient fallen since last visit? 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  BLE COMPARATIVE LIMB VOLUMETRICS:  INTAKE: 11/01/22  LANDMARK RIGHT    R LEG (A-D) 5947.2 ml  R THIGH (E-G) 9750.0 ml  R FULL LIMB (A-G) 15697.2 ml  Limb Volume differential (LVD)  %  Volume change since initial %  Volume change overall V  (Blank rows = not tested)  LANDMARK LEFT   L LEG (A-D) 6011.6 ml  L THIGH (E-G) 9602.5 ml  L FULL LIMB (A-G) 15614.1 ml  Limb Volume differential (LVD)  LEG LVD = 1.8%, L>R % THIGH LVD = 1.5%, R>L FULL LIMB LVD = 0.53%, R>L  Volume change since initial %  Volume change overall %  (Blank rows = not tested)   TODAY'S TREATMENT:     Initial, BLE comparative limb volumetrics LLE multilevel, knee length gradient compression  bandaging                                                                                                                                 PATIENT EDUCATION:  Continued Pt/ CG edu for lymphedema self care home program throughout session. Topics include outcome of comparative limb volumetrics- starting limb volume differentials (LVDs), technology and gradient techniques used for short stretch, multilayer compression wrapping, simple self-MLD, therapeutic lymphatic pumping exercises, skin/nail care, LE precautions,. compression garment recommendations and specifications, wear and care schedule and compression garment donning / doffing w assistive devices. Discussed progress towards all OT goals since commencing CDT. All questions answered to the Pt's satisfaction. Good return.  Lymphedema management  has a high burden of care, especially for Patients who have difficulty, or who are unable to reach their distal legs and feet.  reach their feet and distal legs to apply bandages, compression garments, to bathe , inspect skin and groom nails. In this case because Pt is unable to perform these activities, daily caregiver assistance with the lymphedema self-care home program  between OT visits is essential for achieving clinical success.  Person educated: Patient and Spouse Education method: Explanation, Demonstration, and Handouts Education comprehension: verbalized understanding, returned demonstration, and needs further education  HOME EXERCISE PROGRAM: Lymphatic Pumping Therex-  1 set of 10, 2 x daily, in order, seated  Daily skin inspection and care with low ph lotion matching skin ph to limit infection risk Simple Self-MLD 1 x daily During Intensive Phase CDT: multilayer compression wraps from foot to groin using gradient techniques, one limb at a time  to ensure safety During Self -Management Phase CDT: Fit with custom daytime compression garments- consider flat knit, Elvarex Classic, ccl 2 ( 23-32 mmHg) knee highs paired with Capri pantyhose (B-G) for full time daily use Fit with BLE knee length Jobst RELAX to limit fibrosis formation  and facilitate increased lymphatic function during HOS Custom-made gradient compression garments and HOS devices are medically necessary in this case because they are uniquely sized and shaped to fit the exact dimensions of the affected extremities, and to provide accurate and consistent gradient compression essential for optimally managing this patient's symptoms of chronic, progressive lymphedema. Multiple custom compression garments are needed for optimal hygiene. Custom compression garments should be replaced q 3-6 months When worn consistently for optimal effectiveness.  6. Fit Pt with advanced Flexitouch PLUS sequential pneumatic compression device medically  necessary to treat lipo-lymphedema extending above the inguinal lymph nodes, including the hips, abdomen, buttocks and abdomen. This device follows lymphatic anatomy and provides proximal to distal stimulation above the inguinal lymph nodes to ensure optimal lymphatic return via the thoracic duct.   ASSESSMENT:  CLINICAL IMPRESSION:BLE limb volumetrics reveal that lower extremities are, overall, very symmetrical in volume. Typical presentation for lipo-lymphedema is this symmetrical distribution bilaterally with highest concentration of painful swelling proximally in hips, thighs and abdomen. Please see Volumetrics chart above for specific   volumes. Applied multilayer compression wraps to LLE below the knee using gradient techniques, which I also taught Pt and her  spouse to do. More edu is needed and next visit will be devoted to adjusting for tolerance and teaching with lots of opportunities for practice, Cont as per POC.   OBJECTIVE IMPAIRMENTS: Abnormal gait, decreased activity tolerance, decreased balance, decreased knowledge of condition, decreased knowledge of use of DME, decreased mobility, difficulty walking, decreased ROM, increased edema, impaired sensation, obesity, pain, and chronic , progressive swelling and pain of buttocks, hips, abdomen and legs .   ACTIVITY LIMITATIONS: carrying, lifting, bending, sitting, standing, squatting, sleeping, stairs, transfers, bed mobility, bathing, dressing, hygiene/grooming, caring for others, and leisure pursuits, social participation, productive/ work activities  PARTICIPATION LIMITATIONS: meal prep, cleaning, laundry, interpersonal relationship, driving, shopping, community activity, occupation, yard work, and church  PERSONAL FACTORS: Age, Past/current experiences, hx of panic attacks and claustrophobia, Time since onset of injury/illness/exacerbation,, motivation, supportive spouse and family are also affecting patient's functional outcome.    REHAB POTENTIAL: Fair Unfortunately the Lipedema does not respond to CDT and is unchanged by this protocol. Fortunately lymphedema  does respond to differing degrees based on the quality and amount of fatty fibrosis present obstructing lymphatics, and patient's tolerance if skin is hypersensitive.   EVALUATION COMPLEXITY: Moderate   GOALS: Goals reviewed with patient? Yes  SHORT TERM GOALS: Target date: 4th OT Rx visit   Pt will demonstrate understanding of lymphedema precautions and prevention strategies with modified independence using a printed reference to identify at least 5 precautions and discussing how s/he may implement them into daily life to reduce risk of progression with modified assistance ( printed reference). Baseline: Max A Goal status: INITIAL  2.  Pt will be able to apply multilayer, thigh length, compression wraps using gradient techniques with Max caregiver assistance to decrease limb volume, to limit infection risk, and to limit lymphedema progression.  Baseline: Dependent Goal status: INITIAL  LONG TERM GOALS: Target date: 01/11/23  Given this patient's Intake score of 36.76 % on the Lymphedema Life Impact Scale (LLIS), patient will experience a reduction of at least 5 points in her perceived level of functional impairment resulting from lymphedema to improve functional performance and quality of life (QOL). Baseline: 36.76% Goal status: INITIAL  2.  Given this patient's Intake score of 56/100% on the functional outcomes FOTO tool, patient will experience an increase in function of 3 points  to improve basic and instrumental ADLs performance, including lymphedema self-care.  Baseline: 56% Goal status: INITIAL  3.  During Intensive phase CDT Pt will achieve at least 85% compliance with all lymphedema self-care home program components, including  daily skin care, multilayer , gradient compression wraps with daily changes, daily simple self MLD and daily lymphatic  pumping therex to achieve optimal clinical outcome and to habituate self care regime for optimal LE self-management over time. Baseline: Dependent Goal status: INITIAL  4.  Pt will achieve at least a 10% volume reductions bilaterally below the knees to return limb to more typical size and shape, to limit infection risk and LE progression, to decrease pain, to improve function, and to improve body image and QOL. Baseline: Dependent Goal status: INITIAL  5.  Pt will be able to don and doff appropriate compression garments and devices using assistive devices and extra time within 1 week of issue date for optimal lymphedema self-care. Baseline: Dependent Goal status: INITIAL  6.  During Intensive phase CDT Pt will achieve at least 85% compliance with all lymphedema self-care home program components, including  daily skin care, multilayer , gradient compression wraps with daily changes, daily simple self MLD and daily lymphatic pumping therex to achieve optimal clinical outcome and to habituate self care regime for optimal LE self-management over time. Baseline: ax A Goal status: INITIAL   PLAN:  PT FREQUENCY: 2x/week  PT DURATION: 12  weeks other: and PRN  PLANNED INTERVENTIONS: Therapeutic exercises, Therapeutic activity, Patient/Family education, Self Care, DME instructions, Manual lymph drainage, Compression bandaging, Manual therapy, and skin care during MLD, fit with appropriate custom compression garments and devices, fit with appropriate compression device  which  follows lymphatic pathways and anatomic distribution  PLAN FOR NEXT SESSION:  BLE comparative limb volumetrics Initial knee length wrap to limb Pt deems most involved/ painful Pt/ family edu for LE self care   Loel Dubonnet, MS, OTR/L, CLT-LANA 11/02/22 9:36 AM

## 2022-11-03 ENCOUNTER — Ambulatory Visit: Payer: 59 | Admitting: Occupational Therapy

## 2022-11-03 ENCOUNTER — Encounter: Payer: 59 | Admitting: Physical Therapy

## 2022-11-03 DIAGNOSIS — I890 Lymphedema, not elsewhere classified: Secondary | ICD-10-CM

## 2022-11-04 ENCOUNTER — Telehealth: Payer: 59 | Admitting: Surgical

## 2022-11-04 ENCOUNTER — Encounter: Payer: Self-pay | Admitting: Occupational Therapy

## 2022-11-04 NOTE — Therapy (Signed)
OUTPATIENT OCCUPATIONAL THERAPY LOWER EXTREMITY LYMPHEDEMA EVALUATION  Patient Name: Mary [name] MRN: [id number] DOB:[birth year], 62 y.o., female Today's Date: 11/04/2022  END OF SESSION:  OT End of Session - 11/03/22 1411     Visit Number 3    Number of Visits 36    Date for OT Re-Evaluation 01/11/23    OT Start Time 0205    OT Stop Time 0315    OT Time Calculation (min) 70 min    Activity Tolerance Patient tolerated treatment well;No increased pain    Behavior During Therapy WFL for tasks assessed/performed               Past Medical History:  Diagnosis Date   Anemia    low iron at times   Arthritis    Colon polyps    Complication of anesthesia 1991   Pt reports she awoke during plantar fascia surgery and felt "everything"   Heart murmur    " small per patient"    Hyperlipidemia    Hypertension    Hypothyroidism    Lactose intolerance    PAC (premature atrial contraction)    Palpitations    Panic attacks    Pt reports restrictive clothing/areas and dry mouth cause her to have attack.   Peripheral vascular disease (HCC)    "small" clot after thermal ablasion   Pneumonia 2016   "walking pneumonia" 3 times in 2016   Shortness of breath    going upstairs (due to weight). not so much since weight loss   Sleep apnea    after weight loss does not have to use cpap   Vitamin B12 deficiency    Vitamin D deficiency    Past Surgical History:  Procedure Laterality Date   APPENDECTOMY  1985   CESAREAN SECTION  1987   twins   COLONOSCOPY     COLONOSCOPY WITH PROPOFOL N/A 02/27/2018   Procedure: COLONOSCOPY WITH PROPOFOL;  Surgeon: Toledo, Boykin Nearing, MD;  Location: [facility];  Service: Gastroenterology;  Laterality: N/A;   IRRIGATION AND DEBRIDEMENT HEMATOMA Right 11/12/2015   Procedure: IRRIGATION AND DEBRIDEMENT HEMATOMA;  Surgeon: Earline Mayotte, MD;  Location: [facility];  Service: General;  Laterality: Right;   KNEE ARTHROSCOPY WITH MEDIAL MENISECTOMY Left  07/15/2020   Procedure: Left knee arthroscopy with partial medial or lateral menisectomy, possible chondroplasty, possible partial synovectomy;  Surgeon: Lyndle Herrlich, MD;  Location: [facility];  Service: Orthopedics;  Laterality: Left;   LAMINECTOMY  07/10/2013   L4   L5   DR Yetta Barre   LAPAROSCOPIC SALPINGO OOPHERECTOMY Right 04/17/2014   Procedure: LAPAROSCOPIC SALPINGO OOPHORECTOMY RIGHT;  Surgeon: Adolphus Birchwood, MD;  Location: [facility];  Service: Gynecology;  Laterality: Right;   LYSIS OF ADHESION  04/17/2014   Procedure: LYSIS OF ADHESION;  Surgeon: Adolphus Birchwood, MD;  Location: [facility];  Service: Gynecology;;   MAXIMUM ACCESS (MAS)POSTERIOR LUMBAR INTERBODY FUSION (PLIF) 1 LEVEL  07/10/2013   Procedure: FOR MAXIMUM ACCESS (MAS) POSTERIOR LUMBAR INTERBODY FUSION (PLIF) LUMBAR FOUR-FIVE;  Surgeon: Tia Alert, MD;  Location: [facility];  Service: Neurosurgery;;  FOR MAXIMUM ACCESS (MAS) POSTERIOR LUMBAR INTERBODY FUSION (PLIF) LUMBAR FOUR-FIVE   OOPHORECTOMY     LSO   OPEN REDUCTION INTERNAL FIXATION (ORIF) DISTAL RADIAL FRACTURE Left 09/20/2018   Procedure: OPEN REDUCTION INTERNAL FIXATION (ORIF) DISTAL RADIAL FRACTURE, LEFT;  Surgeon: Kennedy Bucker, MD;  Location: [facility];  Service: Orthopedics;  Laterality: Left;   PLANTAR FASCIA SURGERY  1999   ROUX-EN-Y  GASTRIC BYPASS  06/2006   VAGINAL HYSTERECTOMY  2001   for bleeding and LSO for cyst   Patient Active Problem List   Diagnosis Date Noted   Reactive hypoglycemia 02/16/2022   Prediabetes 02/16/2022   Chronic venous insufficiency 12/23/2021   Lymphedema 12/13/2021   Panic attacks    Strain of knee 12/24/2019   Vitamin B12 deficiency 10/08/2019   Arthritis 07/16/2019   Edema 07/16/2019   History of abdominal pain 07/16/2019   Low back pain 07/16/2019   Vitamin D deficiency 07/16/2019   Hypothyroidism 02/28/2019   History of gastric bypass 02/28/2019   Obstructive sleep apnea syndrome 02/28/2019   Insomnia 02/28/2019   Class 3 severe  obesity due to excess calories with serious comorbidity and body mass index (BMI) of 40.0 to 44.9 in adult (HCC) 02/28/2019   Elevated BP without diagnosis of hypertension 02/28/2019   Mixed hyperlipidemia 02/28/2019   Hot flashes 02/28/2019   BMI 40.0-44.9, adult (HCC) 12/16/2015   Hematoma of lower extremity 11/12/2015   Essential hypertension 06/20/2014   Pure hypercholesterolemia 06/20/2014   S/P lumbar spinal fusion 07/10/2013   Arthrodesis status 07/10/2013   Palpitations 01/02/2012   Edema of both legs 01/02/2012    PCP: Joni Reining, PA-C  REFERRING PROVIDER: Foster Simpson, DO  REFERRING DIAG: I89.0  THERAPY DIAG:  Lymphedema, not elsewhere classified  Rationale for Evaluation and Treatment: Rehabilitation  ONSET DATE: Insidious onset with progression over time. Greater than 20 years  SUBJECTIVE:                                                                                                                                                                                           SUBJECTIVE STATEMENT:Mary [name] presents for initial OT visit to address BLE lipo-lymphedema. Pt is accompanied by her supportive spouse, [name]. Pt states she is a bit nervous about compression bandaging today. Pt has no new complaints since initial eval last week.Lymphedema-related pain and swelling are unchanged.  PERTINENT HISTORY and contributing factors include, OA, HTN, Panic attacks 2/2 restrictive clothing, OSA (not using CPAP), s/p hysterectomy 1987, L knee sx, oophorectomy, s/p hysterectomy 2001, Gastric Bypass 2007, s/p plantar fascia sx 1999, obesity, insomnia  PAIN:  Are you having pain? Yes: NPRS scale: 6/10 Pain location: BLE- generalized, knees Pain description: discomfort, heavy, full, sore Aggravating factors: standing, walking, extended dependent sitting Relieving factors: elevation  PRECAUTIONS: Other: LYMPHEDEMA PRECAUTIONS: HYPOTHYROID, DIABETES SKIN  PRECAUTIONS  WEIGHT BEARING RESTRICTIONS: No  FALLS:  Has patient fallen since last visit? 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  BLE COMPARATIVE LIMB VOLUMETRICS:  INTAKE: 11/01/22  LANDMARK RIGHT    R LEG (A-D) 5947.2 ml  R THIGH (E-G) 9750.0 ml  R FULL LIMB (A-G) 15697.2 ml  Limb Volume differential (LVD)  %  Volume change since initial %  Volume change overall V  (Blank rows = not tested)  LANDMARK LEFT   L LEG (A-D) 6011.6 ml  L THIGH (E-G) 9602.5 ml  L FULL LIMB (A-G) 15614.1 ml  Limb Volume differential (LVD)  LEG LVD = 1.8%, L>R % THIGH LVD = 1.5%, R>L FULL LIMB LVD = 0.53%, R>L  Volume change since initial %  Volume change overall %  (Blank rows = not tested)   TODAY'S TREATMENT:     Initial, BLE comparative limb volumetrics LLE multilevel, knee length gradient compression  bandaging                                                                                                                                 PATIENT EDUCATION:  Continued Pt/ CG edu for lymphedema self care home program throughout session. Topics include outcome of comparative limb volumetrics- starting limb volume differentials (LVDs), technology and gradient techniques used for short stretch, multilayer compression wrapping, simple self-MLD, therapeutic lymphatic pumping exercises, skin/nail care, LE precautions,. compression garment recommendations and specifications, wear and care schedule and compression garment donning / doffing w assistive devices. Discussed progress towards all OT goals since commencing CDT. All questions answered to the Pt's satisfaction. Good  return.  Lymphedema management has a high burden of care, especially for Patients who have difficulty, or who are unable to reach their distal legs and feet.  reach their feet and distal legs to apply bandages, compression garments, to bathe , inspect skin and groom nails. In this case because Pt is unable to perform these activities, daily caregiver assistance with the lymphedema self-care home program  between OT visits is essential for achieving clinical success.  Person educated: Patient and Spouse Education method: Explanation, Demonstration, and Handouts Education comprehension: verbalized understanding, returned demonstration, and needs further education  HOME EXERCISE PROGRAM: Lymphatic Pumping Therex-  1 set of 10, 2 x daily, in order, seated  Daily skin inspection and care with low ph lotion matching skin ph to limit infection risk Simple Self-MLD 1 x daily During Intensive Phase CDT: multilayer compression wraps from foot to groin using gradient techniques, one limb at a time  to ensure safety During Self -Management Phase CDT: Fit with custom daytime compression garments- consider flat knit, Elvarex Classic, ccl 2 ( 23-32 mmHg) knee highs paired with Capri pantyhose (B-G) for full time daily use Fit with BLE knee length Jobst RELAX to  limit fibrosis formation  and facilitate increased lymphatic function during HOS Custom-made gradient compression garments and HOS devices are medically necessary in this case because they are uniquely sized and shaped to fit the exact dimensions of the affected extremities, and to provide accurate and consistent gradient compression essential for optimally managing this patient's symptoms of chronic, progressive lymphedema. Multiple custom compression garments are needed for optimal hygiene. Custom compression garments should be replaced q 3-6 months When worn consistently for optimal effectiveness.  6. Fit Pt with advanced Flexitouch PLUS sequential  pneumatic compression device medically necessary to treat lipo-lymphedema extending above the inguinal lymph nodes, including the hips, abdomen, buttocks and abdomen. This device follows lymphatic anatomy and provides proximal to distal stimulation above the inguinal lymph nodes to ensure optimal lymphatic return via the thoracic duct.   ASSESSMENT:  CLINICAL IMPRESSION:Continued Pt and family education for knee length, multi layer compression wrapping L leg. By end of session Pt mastered techniques and wads able to direct her spouse to correct mistakes. Next session commence MLD. Productive session.Cont as per POC.   OBJECTIVE IMPAIRMENTS: Abnormal gait, decreased activity tolerance, decreased balance, decreased knowledge of condition, decreased knowledge of use of DME, decreased mobility, difficulty walking, decreased ROM, increased edema, impaired sensation, obesity, pain, and chronic , progressive swelling and pain of buttocks, hips, abdomen and legs .   ACTIVITY LIMITATIONS: carrying, lifting, bending, sitting, standing, squatting, sleeping, stairs, transfers, bed mobility, bathing, dressing, hygiene/grooming, caring for others, and leisure pursuits, social participation, productive/ work activities  PARTICIPATION LIMITATIONS: meal prep, cleaning, laundry, interpersonal relationship, driving, shopping, community activity, occupation, yard work, and church  PERSONAL FACTORS: Age, Past/current experiences, hx of panic attacks and claustrophobia, Time since onset of injury/illness/exacerbation,, motivation, supportive spouse and family are also affecting patient's functional outcome.   REHAB POTENTIAL: Fair Unfortunately the Lipedema does not respond to CDT and is unchanged by this protocol. Fortunately lymphedema  does respond to differing degrees based on the quality and amount of fatty fibrosis present obstructing lymphatics, and patient's tolerance if skin is hypersensitive.   EVALUATION  COMPLEXITY: Moderate   GOALS: Goals reviewed with patient? Yes  SHORT TERM GOALS: Target date: 4th OT Rx visit   Pt will demonstrate understanding of lymphedema precautions and prevention strategies with modified independence using a printed reference to identify at least 5 precautions and discussing how s/he may implement them into daily life to reduce risk of progression with modified assistance ( printed reference). Baseline: Max A Goal status: INITIAL  2.  Pt will be able to apply multilayer, thigh length, compression wraps using gradient techniques with Max caregiver assistance to decrease limb volume, to limit infection risk, and to limit lymphedema progression.  Baseline: Dependent Goal status: INITIAL  LONG TERM GOALS: Target date: 01/11/23  Given this patient's Intake score of 36.76 % on the Lymphedema Life Impact Scale (LLIS), patient will experience a reduction of at least 5 points in her perceived level of functional impairment resulting from lymphedema to improve functional performance and quality of life (QOL). Baseline: 36.76% Goal status: INITIAL  2.  Given this patient's Intake score of 56/100% on the functional outcomes FOTO tool, patient will experience an increase in function of 3 points to improve basic and instrumental ADLs performance, including lymphedema self-care.  Baseline: 56% Goal status: INITIAL  3.  During Intensive phase CDT Pt will achieve at least 85% compliance with all lymphedema self-care home program components, including  daily skin care, multilayer , gradient compression wraps with daily  changes, daily simple self MLD and daily lymphatic pumping therex to achieve optimal clinical outcome and to habituate self care regime for optimal LE self-management over time. Baseline: Dependent Goal status: INITIAL  4.  Pt will achieve at least a 10% volume reductions bilaterally below the knees to return limb to more typical size and shape, to limit infection  risk and LE progression, to decrease pain, to improve function, and to improve body image and QOL. Baseline: Dependent Goal status: INITIAL  5.  Pt will be able to don and doff appropriate compression garments and devices using assistive devices and extra time within 1 week of issue date for optimal lymphedema self-care. Baseline: Dependent Goal status: INITIAL  6.  During Intensive phase CDT Pt will achieve at least 85% compliance with all lymphedema self-care home program components, including  daily skin care, multilayer , gradient compression wraps with daily changes, daily simple self MLD and daily lymphatic pumping therex to achieve optimal clinical outcome and to habituate self care regime for optimal LE self-management over time. Baseline: ax A Goal status: INITIAL   PLAN:  PT FREQUENCY: 2x/week  PT DURATION: 12  weeks other: and PRN  PLANNED INTERVENTIONS: Therapeutic exercises, Therapeutic activity, Patient/Family education, Self Care, DME instructions, Manual lymph drainage, Compression bandaging, Manual therapy, and skin care during MLD, fit with appropriate custom compression garments and devices, fit with appropriate compression device  which  follows lymphatic pathways and anatomic distribution  PLAN FOR NEXT SESSION:  BLE comparative limb volumetrics Initial knee length wrap to limb Pt deems most involved/ painful Pt/ family edu for LE self care   Loel Dubonnet, MS, OTR/L, CLT-LANA 11/04/22 8:01 AM

## 2022-11-07 ENCOUNTER — Encounter: Payer: 59 | Admitting: Physical Therapy

## 2022-11-08 ENCOUNTER — Ambulatory Visit: Payer: 59 | Admitting: Occupational Therapy

## 2022-11-08 ENCOUNTER — Encounter: Payer: Self-pay | Admitting: Occupational Therapy

## 2022-11-08 DIAGNOSIS — I890 Lymphedema, not elsewhere classified: Secondary | ICD-10-CM

## 2022-11-08 NOTE — Therapy (Signed)
OUTPATIENT OCCUPATIONAL THERAPY TREATMENT LOWER EXTREMITY LYMPHEDEMA  Patient Name: DACODA SPALLONE MRN: [id number] DOB:15-May-1961, 62 y.o., female Today's Date: 11/08/2022  END OF SESSION:  OT End of Session - 11/08/22 1417     Visit Number 4    Number of Visits 36    Date for OT Re-Evaluation 01/11/23    OT Start Time 0210    Activity Tolerance Patient tolerated treatment well;No increased pain    Behavior During Therapy WFL for tasks assessed/performed               Past Medical History:  Diagnosis Date   Anemia    low iron at times   Arthritis    Colon polyps    Complication of anesthesia 1991   Pt reports she awoke during plantar fascia surgery and felt "everything"   Heart murmur    " small per patient"    Hyperlipidemia    Hypertension    Hypothyroidism    Lactose intolerance    PAC (premature atrial contraction)    Palpitations    Panic attacks    Pt reports restrictive clothing/areas and dry mouth cause her to have attack.   Peripheral vascular disease (HCC)    "small" clot after thermal ablasion   Pneumonia 2016   "walking pneumonia" 3 times in 2016   Shortness of breath    going upstairs (due to weight). not so much since weight loss   Sleep apnea    after weight loss does not have to use cpap   Vitamin B12 deficiency    Vitamin D deficiency    Past Surgical History:  Procedure Laterality Date   APPENDECTOMY  1985   CESAREAN SECTION  1987   twins   COLONOSCOPY     COLONOSCOPY WITH PROPOFOL N/A 02/27/2018   Procedure: COLONOSCOPY WITH PROPOFOL;  Surgeon: Toledo, Boykin Nearing, MD;  Location: [facility];  Service: Gastroenterology;  Laterality: N/A;   IRRIGATION AND DEBRIDEMENT HEMATOMA Right 11/12/2015   Procedure: IRRIGATION AND DEBRIDEMENT HEMATOMA;  Surgeon: Earline Mayotte, MD;  Location: [facility];  Service: General;  Laterality: Right;   KNEE ARTHROSCOPY WITH MEDIAL MENISECTOMY Left 07/15/2020   Procedure: Left knee arthroscopy with partial  medial or lateral menisectomy, possible chondroplasty, possible partial synovectomy;  Surgeon: Lyndle Herrlich, MD;  Location: [facility];  Service: Orthopedics;  Laterality: Left;   LAMINECTOMY  07/10/2013   L4   L5   DR Yetta Barre   LAPAROSCOPIC SALPINGO OOPHERECTOMY Right 04/17/2014   Procedure: LAPAROSCOPIC SALPINGO OOPHORECTOMY RIGHT;  Surgeon: Adolphus Birchwood, MD;  Location: [facility];  Service: Gynecology;  Laterality: Right;   LYSIS OF ADHESION  04/17/2014   Procedure: LYSIS OF ADHESION;  Surgeon: Adolphus Birchwood, MD;  Location: [facility];  Service: Gynecology;;   MAXIMUM ACCESS (MAS)POSTERIOR LUMBAR INTERBODY FUSION (PLIF) 1 LEVEL  07/10/2013   Procedure: FOR MAXIMUM ACCESS (MAS) POSTERIOR LUMBAR INTERBODY FUSION (PLIF) LUMBAR FOUR-FIVE;  Surgeon: Tia Alert, MD;  Location: [facility];  Service: Neurosurgery;;  FOR MAXIMUM ACCESS (MAS) POSTERIOR LUMBAR INTERBODY FUSION (PLIF) LUMBAR FOUR-FIVE   OOPHORECTOMY     LSO   OPEN REDUCTION INTERNAL FIXATION (ORIF) DISTAL RADIAL FRACTURE Left 09/20/2018   Procedure: OPEN REDUCTION INTERNAL FIXATION (ORIF) DISTAL RADIAL FRACTURE, LEFT;  Surgeon: Kennedy Bucker, MD;  Location: [facility];  Service: Orthopedics;  Laterality: Left;   PLANTAR FASCIA SURGERY  1999   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   VAGINAL HYSTERECTOMY  2001   for bleeding and LSO  for cyst   Patient Active Problem List   Diagnosis Date Noted   Reactive hypoglycemia 02/16/2022   Prediabetes 02/16/2022   Chronic venous insufficiency 12/23/2021   Lymphedema 12/13/2021   Panic attacks    Strain of knee 12/24/2019   Vitamin B12 deficiency 10/08/2019   Arthritis 07/16/2019   Edema 07/16/2019   History of abdominal pain 07/16/2019   Low back pain 07/16/2019   Vitamin D deficiency 07/16/2019   Hypothyroidism 02/28/2019   History of gastric bypass 02/28/2019   Obstructive sleep apnea syndrome 02/28/2019   Insomnia 02/28/2019   Class 3 severe obesity due to excess calories with serious comorbidity  and body mass index (BMI) of 40.0 to 44.9 in adult (HCC) 02/28/2019   Elevated BP without diagnosis of hypertension 02/28/2019   Mixed hyperlipidemia 02/28/2019   Hot flashes 02/28/2019   BMI 40.0-44.9, adult (HCC) 12/16/2015   Hematoma of lower extremity 11/12/2015   Essential hypertension 06/20/2014   Pure hypercholesterolemia 06/20/2014   S/P lumbar spinal fusion 07/10/2013   Arthrodesis status 07/10/2013   Palpitations 01/02/2012   Edema of both legs 01/02/2012    PCP: Joni Reining, PA-C  REFERRING PROVIDER: Foster Simpson, DO  REFERRING DIAG: I89.0  THERAPY DIAG:  Lymphedema, not elsewhere classified  Rationale for Evaluation and Treatment: Rehabilitation  ONSET DATE: Insidious onset with progression over time. Greater than 20 years  SUBJECTIVE:                                                                                                                                                                                           SUBJECTIVE STATEMENT:Lenetta B Prophete presents for initial OT visit to address BLE lipo-lymphedema. Pt is accompanied by her supportive spouse, [name]. Pt reports LE-related "heaviness"  in her legs rated at 4/10. Pt reports she and he husband, wh9o accompanies her today, have been having some troouble getting the shoe on over the wraps.  PERTINENT HISTORY and contributing factors include, OA, HTN, Panic attacks 2/2 restrictive clothing, OSA (not using CPAP), s/p hysterectomy 1987, L knee sx, oophorectomy, s/p hysterectomy 2001, Gastric Bypass 2007, s/p plantar fascia sx 1999, obesity, insomnia  PAIN:  Are you having pain? Yes: NPRS scale: 6/10 Pain location: BLE- generalized, knees Pain description: discomfort, heavy, full, sore Aggravating factors: standing, walking, extended dependent sitting Relieving factors: elevation  PRECAUTIONS: Other: LYMPHEDEMA PRECAUTIONS: HYPOTHYROID, DIABETES SKIN PRECAUTIONS  WEIGHT BEARING RESTRICTIONS:  No  FALLS:  Has patient fallen since last visit? 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  BLE COMPARATIVE LIMB VOLUMETRICS:  INTAKE: 11/01/22  LANDMARK RIGHT    R LEG (A-D) 5947.2 ml  R THIGH (E-G) 9750.0 ml  R FULL LIMB (A-G) 15697.2 ml  Limb Volume differential (LVD)  %  Volume change since initial %  Volume change overall V  (Blank rows = not tested)  LANDMARK LEFT   L LEG (A-D) 6011.6 ml  L THIGH (E-G) 9602.5 ml  L FULL LIMB (A-G) 15614.1 ml  Limb Volume differential (LVD)  LEG LVD = 1.8%, L>R % THIGH LVD = 1.5%, R>L FULL LIMB LVD = 0.53%, R>L  Volume change since initial %  Volume change overall %  (Blank rows = not tested)   TODAY'S TREATMENT:     Initial, BLE comparative limb volumetrics LLE multilevel, knee length gradient compression  bandaging                                                                                                                                 PATIENT EDUCATION:  Continued Pt/ CG edu for lymphedema self care home program throughout session. Topics include outcome of comparative limb volumetrics- starting limb volume differentials (LVDs), technology and gradient techniques used for short stretch, multilayer compression wrapping, simple self-MLD, therapeutic lymphatic pumping exercises, skin/nail care, LE precautions,. compression garment recommendations and specifications, wear and care schedule and compression garment donning / doffing w assistive devices. Discussed progress towards all OT goals since commencing CDT. All questions answered to the Pt's satisfaction. Good return.  Lymphedema management has a high burden of  care, especially for Patients who have difficulty, or who are unable to reach their distal legs and feet.  reach their feet and distal legs to apply bandages, compression garments, to bathe , inspect skin and groom nails. In this case because Pt is unable to perform these activities, daily caregiver assistance with the lymphedema self-care home program  between OT visits is essential for achieving clinical success.  Person educated: Patient and Spouse Education method: Explanation, Demonstration, and Handouts Education comprehension: verbalized understanding, returned demonstration, and needs further education  HOME EXERCISE PROGRAM: Lymphatic Pumping Therex-  1 set of 10, 2 x daily, in order, seated  Daily skin inspection and care with low ph lotion matching skin ph to limit infection risk Simple Self-MLD 1 x daily During Intensive Phase CDT: multilayer compression wraps from foot to groin using gradient techniques, one limb at a time  to ensure safety During Self -Management Phase CDT: Fit with custom daytime compression garments- consider flat knit, Elvarex Classic, ccl 2 ( 23-32 mmHg) knee highs paired with Capri pantyhose (B-G) for full time daily use Fit with BLE knee length Jobst RELAX to limit fibrosis formation  and facilitate increased lymphatic function  during HOS Custom-made gradient compression garments and HOS devices are medically necessary in this case because they are uniquely sized and shaped to fit the exact dimensions of the affected extremities, and to provide accurate and consistent gradient compression essential for optimally managing this patient's symptoms of chronic, progressive lymphedema. Multiple custom compression garments are needed for optimal hygiene. Custom compression garments should be replaced q 3-6 months When worn consistently for optimal effectiveness.  6. Fit Pt with advanced Flexitouch PLUS sequential pneumatic compression device medically necessary to treat  lipo-lymphedema extending above the inguinal lymph nodes, including the hips, abdomen, buttocks and abdomen. This device follows lymphatic anatomy and provides proximal to distal stimulation above the inguinal lymph nodes to ensure optimal lymphatic return via the thoracic duct.   ASSESSMENT:  CLINICAL IMPRESSION:Continued Pt and family education for knee length, multi layer compression wrapping L leg. By end of session Pt mastered techniques and wads able to direct her spouse to correct mistakes. Next session commence MLD. Productive session.Cont as per POC.   OBJECTIVE IMPAIRMENTS: Abnormal gait, decreased activity tolerance, decreased balance, decreased knowledge of condition, decreased knowledge of use of DME, decreased mobility, difficulty walking, decreased ROM, increased edema, impaired sensation, obesity, pain, and chronic , progressive swelling and pain of buttocks, hips, abdomen and legs .   ACTIVITY LIMITATIONS: carrying, lifting, bending, sitting, standing, squatting, sleeping, stairs, transfers, bed mobility, bathing, dressing, hygiene/grooming, caring for others, and leisure pursuits, social participation, productive/ work activities  PARTICIPATION LIMITATIONS: meal prep, cleaning, laundry, interpersonal relationship, driving, shopping, community activity, occupation, yard work, and church  PERSONAL FACTORS: Age, Past/current experiences, hx of panic attacks and claustrophobia, Time since onset of injury/illness/exacerbation,, motivation, supportive spouse and family are also affecting patient's functional outcome.   REHAB POTENTIAL: Fair Unfortunately the Lipedema does not respond to CDT and is unchanged by this protocol. Fortunately lymphedema  does respond to differing degrees based on the quality and amount of fatty fibrosis present obstructing lymphatics, and patient's tolerance if skin is hypersensitive.   EVALUATION COMPLEXITY: Moderate   GOALS: Goals reviewed with patient?  Yes  SHORT TERM GOALS: Target date: 4th OT Rx visit   Pt will demonstrate understanding of lymphedema precautions and prevention strategies with modified independence using a printed reference to identify at least 5 precautions and discussing how s/he may implement them into daily life to reduce risk of progression with modified assistance ( printed reference). Baseline: Max A Goal status: INITIAL  2.  Pt will be able to apply multilayer, thigh length, compression wraps using gradient techniques with Max caregiver assistance to decrease limb volume, to limit infection risk, and to limit lymphedema progression.  Baseline: Dependent Goal status: INITIAL  LONG TERM GOALS: Target date: 01/11/23  Given this patient's Intake score of 36.76 % on the Lymphedema Life Impact Scale (LLIS), patient will experience a reduction of at least 5 points in her perceived level of functional impairment resulting from lymphedema to improve functional performance and quality of life (QOL). Baseline: 36.76% Goal status: INITIAL  2.  Given this patient's Intake score of 56/100% on the functional outcomes FOTO tool, patient will experience an increase in function of 3 points to improve basic and instrumental ADLs performance, including lymphedema self-care.  Baseline: 56% Goal status: INITIAL  3.  During Intensive phase CDT Pt will achieve at least 85% compliance with all lymphedema self-care home program components, including  daily skin care, multilayer , gradient compression wraps with daily changes, daily simple self MLD and daily lymphatic pumping  therex to achieve optimal clinical outcome and to habituate self care regime for optimal LE self-management over time. Baseline: Dependent Goal status: INITIAL  4.  Pt will achieve at least a 10% volume reductions bilaterally below the knees to return limb to more typical size and shape, to limit infection risk and LE progression, to decrease pain, to improve function,  and to improve body image and QOL. Baseline: Dependent Goal status: INITIAL  5.  Pt will be able to don and doff appropriate compression garments and devices using assistive devices and extra time within 1 week of issue date for optimal lymphedema self-care. Baseline: Dependent Goal status: INITIAL  6.  During Intensive phase CDT Pt will achieve at least 85% compliance with all lymphedema self-care home program components, including  daily skin care, multilayer , gradient compression wraps with daily changes, daily simple self MLD and daily lymphatic pumping therex to achieve optimal clinical outcome and to habituate self care regime for optimal LE self-management over time. Baseline: ax A Goal status: INITIAL   PLAN:  PT FREQUENCY: 2x/week  PT DURATION: 12  weeks other: and PRN  PLANNED INTERVENTIONS: Therapeutic exercises, Therapeutic activity, Patient/Family education, Self Care, DME instructions, Manual lymph drainage, Compression bandaging, Manual therapy, and skin care during MLD, fit with appropriate custom compression garments and devices, fit with appropriate compression device  which  follows lymphatic pathways and anatomic distribution  PLAN FOR NEXT SESSION:  BLE comparative limb volumetrics Initial knee length wrap to limb Pt deems most involved/ painful Pt/ family edu for LE self care   Loel Dubonnet, MS, OTR/L, CLT-LANA 11/09/22 8:15 AM

## 2022-11-09 ENCOUNTER — Encounter: Payer: 59 | Admitting: Physical Therapy

## 2022-11-10 ENCOUNTER — Ambulatory Visit: Payer: 59 | Attending: Plastic Surgery | Admitting: Occupational Therapy

## 2022-11-10 ENCOUNTER — Encounter: Payer: Self-pay | Admitting: Occupational Therapy

## 2022-11-10 DIAGNOSIS — I890 Lymphedema, not elsewhere classified: Secondary | ICD-10-CM

## 2022-11-10 NOTE — Therapy (Signed)
OUTPATIENT OCCUPATIONAL THERAPY TREATMENT LOWER EXTREMITY LYMPHEDEMA  Patient Name: Mary [name] MRN: [id number] DOB:04-29-1961, 62 y.o., female Today's Date: 11/10/2022  END OF SESSION:  OT End of Session - 11/10/22 1418     Visit Number 5    Number of Visits 36    Date for OT Re-Evaluation 01/11/23    OT Start Time 0205    OT Stop Time 0310    OT Time Calculation (min) 65 min    Activity Tolerance Patient tolerated treatment well;No increased pain    Behavior During Therapy WFL for tasks assessed/performed               Past Medical History:  Diagnosis Date   Anemia    low iron at times   Arthritis    Colon polyps    Complication of anesthesia 1991   Pt reports she awoke during plantar fascia surgery and felt "everything"   Heart murmur    " small per patient"    Hyperlipidemia    Hypertension    Hypothyroidism    Lactose intolerance    PAC (premature atrial contraction)    Palpitations    Panic attacks    Pt reports restrictive clothing/areas and dry mouth cause her to have attack.   Peripheral vascular disease (HCC)    "small" clot after thermal ablasion   Pneumonia 2016   "walking pneumonia" 3 times in 2016   Shortness of breath    going upstairs (due to weight). not so much since weight loss   Sleep apnea    after weight loss does not have to use cpap   Vitamin B12 deficiency    Vitamin D deficiency    Past Surgical History:  Procedure Laterality Date   APPENDECTOMY  1985   CESAREAN SECTION  1987   twins   COLONOSCOPY     COLONOSCOPY WITH PROPOFOL N/A 02/27/2018   Procedure: COLONOSCOPY WITH PROPOFOL;  Surgeon: Toledo, Boykin Nearing, MD;  Location: [facility];  Service: Gastroenterology;  Laterality: N/A;   IRRIGATION AND DEBRIDEMENT HEMATOMA Right 11/12/2015   Procedure: IRRIGATION AND DEBRIDEMENT HEMATOMA;  Surgeon: Earline Mayotte, MD;  Location: [facility];  Service: General;  Laterality: Right;   KNEE ARTHROSCOPY WITH MEDIAL MENISECTOMY Left  07/15/2020   Procedure: Left knee arthroscopy with partial medial or lateral menisectomy, possible chondroplasty, possible partial synovectomy;  Surgeon: Lyndle Herrlich, MD;  Location: [facility];  Service: Orthopedics;  Laterality: Left;   LAMINECTOMY  07/10/2013   L4   L5   DR Yetta Barre   LAPAROSCOPIC SALPINGO OOPHERECTOMY Right 04/17/2014   Procedure: LAPAROSCOPIC SALPINGO OOPHORECTOMY RIGHT;  Surgeon: Adolphus Birchwood, MD;  Location: [facility];  Service: Gynecology;  Laterality: Right;   LYSIS OF ADHESION  04/17/2014   Procedure: LYSIS OF ADHESION;  Surgeon: Adolphus Birchwood, MD;  Location: [facility];  Service: Gynecology;;   MAXIMUM ACCESS (MAS)POSTERIOR LUMBAR INTERBODY FUSION (PLIF) 1 LEVEL  07/10/2013   Procedure: FOR MAXIMUM ACCESS (MAS) POSTERIOR LUMBAR INTERBODY FUSION (PLIF) LUMBAR FOUR-FIVE;  Surgeon: Tia Alert, MD;  Location: [facility];  Service: Neurosurgery;;  FOR MAXIMUM ACCESS (MAS) POSTERIOR LUMBAR INTERBODY FUSION (PLIF) LUMBAR FOUR-FIVE   OOPHORECTOMY     LSO   OPEN REDUCTION INTERNAL FIXATION (ORIF) DISTAL RADIAL FRACTURE Left 09/20/2018   Procedure: OPEN REDUCTION INTERNAL FIXATION (ORIF) DISTAL RADIAL FRACTURE, LEFT;  Surgeon: Kennedy Bucker, MD;  Location: [facility];  Service: Orthopedics;  Laterality: Left;   PLANTAR FASCIA SURGERY  1999   ROUX-EN-Y  GASTRIC BYPASS  06/2006   VAGINAL HYSTERECTOMY  2001   for bleeding and LSO for cyst   Patient Active Problem List   Diagnosis Date Noted   Reactive hypoglycemia 02/16/2022   Prediabetes 02/16/2022   Chronic venous insufficiency 12/23/2021   Lymphedema 12/13/2021   Panic attacks    Strain of knee 12/24/2019   Vitamin B12 deficiency 10/08/2019   Arthritis 07/16/2019   Edema 07/16/2019   History of abdominal pain 07/16/2019   Low back pain 07/16/2019   Vitamin D deficiency 07/16/2019   Hypothyroidism 02/28/2019   History of gastric bypass 02/28/2019   Obstructive sleep apnea syndrome 02/28/2019   Insomnia 02/28/2019   Class 3 severe  obesity due to excess calories with serious comorbidity and body mass index (BMI) of 40.0 to 44.9 in adult (HCC) 02/28/2019   Elevated BP without diagnosis of hypertension 02/28/2019   Mixed hyperlipidemia 02/28/2019   Hot flashes 02/28/2019   BMI 40.0-44.9, adult (HCC) 12/16/2015   Hematoma of lower extremity 11/12/2015   Essential hypertension 06/20/2014   Pure hypercholesterolemia 06/20/2014   S/P lumbar spinal fusion 07/10/2013   Arthrodesis status 07/10/2013   Palpitations 01/02/2012   Edema of both legs 01/02/2012    PCP: Joni Reining, PA-C  REFERRING PROVIDER: Foster Simpson, DO  REFERRING DIAG: I89.0  THERAPY DIAG:  Lymphedema, not elsewhere classified  Rationale for Evaluation and Treatment: Rehabilitation  ONSET DATE: Insidious onset with progression over time. Greater than 20 years  SUBJECTIVE:                                                                                                                                                                                           SUBJECTIVE STATEMENT:Mary [name] presents for initial OT visit to address BLE lipo-lymphedema. Pt is accompanied by her supportive spouse, [name]. Pt reports LE-related "heaviness"  in her legs rated at 4/10 is unchanged. Pt reports wraps applied at last session without foot portion were much more comfortable and did not result in foot swelling. Pt pleased with bandage modification and would like to repeat.  PERTINENT HISTORY and contributing factors include, OA, HTN, Panic attacks 2/2 restrictive clothing, OSA (not using CPAP), s/p hysterectomy 1987, L knee sx, oophorectomy, s/p hysterectomy 2001, Gastric Bypass 2007, s/p plantar fascia sx 1999, obesity, insomnia  PAIN:  Are you having pain? Yes: NPRS scale: 6/10 Pain location: BLE- generalized, knees Pain description: discomfort, heavy, full, sore Aggravating factors: standing, walking, extended dependent sitting Relieving factors:  elevation  PRECAUTIONS: Other: LYMPHEDEMA PRECAUTIONS: HYPOTHYROID, DIABETES SKIN PRECAUTIONS  WEIGHT BEARING RESTRICTIONS: No  FALLS:  Has patient fallen since last visit?  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  BLE COMPARATIVE LIMB VOLUMETRICS:  INTAKE: 11/01/22  LANDMARK RIGHT    R LEG (A-D) 5947.2 ml  R THIGH (E-G) 9750.0 ml  R FULL LIMB (A-G) 15697.2 ml  Limb Volume differential (LVD)  %  Volume change since initial %  Volume change overall V  (Blank rows = not tested)  LANDMARK LEFT   L LEG (A-D) 6011.6 ml  L THIGH (E-G) 9602.5 ml  L FULL LIMB (A-G) 15614.1 ml  Limb Volume differential (LVD)  LEG LVD = 1.8%, L>R % THIGH LVD = 1.5%, R>L FULL LIMB LVD = 0.53%, R>L  Volume change since initial %  Volume change overall %  (Blank rows = not tested)   TODAY'S TREATMENT:     1.MLD to LLE utilizing short neck sequence, diaphragmatic breathing , functional inguinal LN and J strokes from proximal to distal, then 3 retrograde sweeps to terminus. 2.Skin care throughout MLD using low Ph castor oil 3. LLE multilevel, knee length gradient compression  bandaging 4, PATIENT EDUCATION:  Continued Pt/ CG edu for lymphedema self care home program throughout session. Topics include outcome of comparative limb volumetrics- starting limb volume differentials (LVDs), technology and gradient techniques used for short stretch, multilayer compression wrapping, simple self-MLD, therapeutic lymphatic pumping exercises, skin/nail care, LE precautions,. compression garment recommendations and specifications, wear and care schedule and compression garment donning / doffing w  assistive devices. Discussed progress towards all OT goals since commencing CDT. All questions answered to the Pt's satisfaction. Good return.  Lymphedema management has a high burden of care, especially for Patients who have difficulty, or who are unable to reach their distal legs and feet.  reach their feet and distal legs to apply bandages, compression garments, to bathe , inspect skin and groom nails. In this case because Pt is unable to perform these activities, daily caregiver assistance with the lymphedema self-care home program  between OT visits is essential for achieving clinical success.  Person educated: Patient and Spouse Education method: Explanation, Demonstration, and Handouts Education comprehension: verbalized understanding, returned demonstration, and needs further education  HOME EXERCISE PROGRAM: Lymphatic Pumping Therex-  1 set of 10, 2 x daily, in order, seated  Daily skin inspection and care with low ph lotion matching skin ph to limit infection risk Simple Self-MLD 1 x daily During Intensive Phase CDT: multilayer compression wraps from foot to groin using gradient techniques, one limb at a time  to ensure safety During Self -Management Phase CDT: Fit with custom daytime compression garments- consider flat knit, Elvarex Classic, ccl 2 ( 23-32 mmHg) knee highs paired with Capri pantyhose (B-G) for full time daily use Fit with BLE knee length Jobst RELAX to limit fibrosis formation  and facilitate increased lymphatic function during HOS Custom-made gradient compression garments and HOS devices are medically necessary in this case because they are uniquely sized and shaped to fit the exact dimensions of the affected extremities, and to provide accurate and consistent gradient compression essential for optimally managing this patient's symptoms of chronic, progressive lymphedema. Multiple custom compression garments are needed for optimal hygiene. Custom compression garments should  be replaced q 3-6 months  When worn consistently for optimal effectiveness.  6. Fit Pt with advanced Flexitouch PLUS sequential pneumatic compression device medically necessary to treat lipo-lymphedema extending above the inguinal lymph nodes, including the hips, abdomen, buttocks and abdomen. This device follows lymphatic anatomy and provides proximal to distal stimulation above the inguinal lymph nodes to ensure optimal lymphatic return via the thoracic duct.   ASSESSMENT:  CLINICAL IMPRESSION. L leg appears moderately reduced in volume as evidenced by reduced ankle circumference and more typical shape of leg. Pt tolerated compression wraps as directed between visits. She and spouse have mastered wrapping using gradient techniques. Commenced MLD to LLE. Pt able to perform J stroke and short neck sequence after skilled teaching for LE simple self-MLD. Applied compression wraps as established. Cont as per POC.  OBJECTIVE IMPAIRMENTS: Abnormal gait, decreased activity tolerance, decreased balance, decreased knowledge of condition, decreased knowledge of use of DME, decreased mobility, difficulty walking, decreased ROM, increased edema, impaired sensation, obesity, pain, and chronic , progressive swelling and pain of buttocks, hips, abdomen and legs .   ACTIVITY LIMITATIONS: carrying, lifting, bending, sitting, standing, squatting, sleeping, stairs, transfers, bed mobility, bathing, dressing, hygiene/grooming, caring for others, and leisure pursuits, social participation, productive/ work activities  PARTICIPATION LIMITATIONS: meal prep, cleaning, laundry, interpersonal relationship, driving, shopping, community activity, occupation, yard work, and church  PERSONAL FACTORS: Age, Past/current experiences, hx of panic attacks and claustrophobia, Time since onset of injury/illness/exacerbation,, motivation, supportive spouse and family are also affecting patient's functional outcome.   REHAB POTENTIAL:  Fair Unfortunately the Lipedema does not respond to CDT and is unchanged by this protocol. Fortunately lymphedema  does respond to differing degrees based on the quality and amount of fatty fibrosis present obstructing lymphatics, and patient's tolerance if skin is hypersensitive.   EVALUATION COMPLEXITY: Moderate   GOALS: Goals reviewed with patient? Yes  SHORT TERM GOALS: Target date: 4th OT Rx visit   Pt will demonstrate understanding of lymphedema precautions and prevention strategies with modified independence using a printed reference to identify at least 5 precautions and discussing how s/he may implement them into daily life to reduce risk of progression with modified assistance ( printed reference). Baseline: Max A Goal status: INITIAL  2.  Pt will be able to apply multilayer, thigh length, compression wraps using gradient techniques with Max caregiver assistance to decrease limb volume, to limit infection risk, and to limit lymphedema progression.  Baseline: Dependent Goal status: 11/10/22 Goal Met  LONG TERM GOALS: Target date: 01/11/23  Given this patient's Intake score of 36.76 % on the Lymphedema Life Impact Scale (LLIS), patient will experience a reduction of at least 5 points in her perceived level of functional impairment resulting from lymphedema to improve functional performance and quality of life (QOL). Baseline: 36.76% Goal status: INITIAL  2.  Given this patient's Intake score of 56/100% on the functional outcomes FOTO tool, patient will experience an increase in function of 3 points to improve basic and instrumental ADLs performance, including lymphedema self-care.  Baseline: 56% Goal status: INITIAL  3.  During Intensive phase CDT Pt will achieve at least 85% compliance with all lymphedema self-care home program components, including  daily skin care, multilayer , gradient compression wraps with daily changes, daily simple self MLD and daily lymphatic pumping therex  to achieve optimal clinical outcome and to habituate self care regime for optimal LE self-management over time. Baseline: Dependent Goal status: INITIAL  4.  Pt will achieve at least a 10% volume reductions bilaterally below the knees to  return limb to more typical size and shape, to limit infection risk and LE progression, to decrease pain, to improve function, and to improve body image and QOL. Baseline: Dependent Goal status: INITIAL  5.  Pt will be able to don and doff appropriate compression garments and devices using assistive devices and extra time within 1 week of issue date for optimal lymphedema self-care. Baseline: Dependent Goal status: INITIAL  6.  During Intensive phase CDT Pt will achieve at least 85% compliance with all lymphedema self-care home program components, including  daily skin care, multilayer , gradient compression wraps with daily changes, daily simple self MLD and daily lymphatic pumping therex to achieve optimal clinical outcome and to habituate self care regime for optimal LE self-management over time. Baseline: ax A Goal status: INITIAL   PLAN:  PT FREQUENCY: 2x/week  PT DURATION: 12  weeks other: and PRN  PLANNED INTERVENTIONS: Therapeutic exercises, Therapeutic activity, Patient/Family education, Self Care, DME instructions, Manual lymph drainage, Compression bandaging, Manual therapy, and skin care during MLD, fit with appropriate custom compression garments and devices, fit with appropriate compression device  which  follows lymphatic pathways and anatomic distribution  PLAN FOR NEXT SESSION:  BLE comparative limb volumetrics Initial knee length wrap to limb Pt deems most involved/ painful Pt/ family edu for LE self care   Loel Dubonnet, MS, OTR/L, CLT-LANA 11/10/22 3:23 PM  11/10/22 3:09 PM OUTPATIENT OCCUPATIONAL THERAPY TREATMENT LOWER EXTREMITY LYMPHEDEMA  Patient Name: Mary [name] MRN: [id number] DOB:1961/04/29, 62 y.o.,  female Today's Date: 11/10/2022  END OF SESSION:  OT End of Session - 11/10/22 1418     Visit Number 5    Number of Visits 36    Date for OT Re-Evaluation 01/11/23    OT Start Time 0205    OT Stop Time 0310    OT Time Calculation (min) 65 min    Activity Tolerance Patient tolerated treatment well;No increased pain    Behavior During Therapy WFL for tasks assessed/performed               Past Medical History:  Diagnosis Date   Anemia    low iron at times   Arthritis    Colon polyps    Complication of anesthesia 1991   Pt reports she awoke during plantar fascia surgery and felt "everything"   Heart murmur    " small per patient"    Hyperlipidemia    Hypertension    Hypothyroidism    Lactose intolerance    PAC (premature atrial contraction)    Palpitations    Panic attacks    Pt reports restrictive clothing/areas and dry mouth cause her to have attack.   Peripheral vascular disease (HCC)    "small" clot after thermal ablasion   Pneumonia 2016   "walking pneumonia" 3 times in 2016   Shortness of breath    going upstairs (due to weight). not so much since weight loss   Sleep apnea    after weight loss does not have to use cpap   Vitamin B12 deficiency    Vitamin D deficiency    Past Surgical History:  Procedure Laterality Date   APPENDECTOMY  1985   CESAREAN SECTION  1987   twins   COLONOSCOPY     COLONOSCOPY WITH PROPOFOL N/A 02/27/2018   Procedure: COLONOSCOPY WITH PROPOFOL;  Surgeon: Toledo, Boykin Nearing, MD;  Location: [facility];  Service: Gastroenterology;  Laterality: N/A;   IRRIGATION AND DEBRIDEMENT HEMATOMA Right 11/12/2015   Procedure:  IRRIGATION AND DEBRIDEMENT HEMATOMA;  Surgeon: Earline Mayotte, MD;  Location: [facility];  Service: General;  Laterality: Right;   KNEE ARTHROSCOPY WITH MEDIAL MENISECTOMY Left 07/15/2020   Procedure: Left knee arthroscopy with partial medial or lateral menisectomy, possible chondroplasty, possible partial synovectomy;   Surgeon: Lyndle Herrlich, MD;  Location: [facility];  Service: Orthopedics;  Laterality: Left;   LAMINECTOMY  07/10/2013   L4   L5   DR Yetta Barre   LAPAROSCOPIC SALPINGO OOPHERECTOMY Right 04/17/2014   Procedure: LAPAROSCOPIC SALPINGO OOPHORECTOMY RIGHT;  Surgeon: Adolphus Birchwood, MD;  Location: [facility];  Service: Gynecology;  Laterality: Right;   LYSIS OF ADHESION  04/17/2014   Procedure: LYSIS OF ADHESION;  Surgeon: Adolphus Birchwood, MD;  Location: [facility];  Service: Gynecology;;   MAXIMUM ACCESS (MAS)POSTERIOR LUMBAR INTERBODY FUSION (PLIF) 1 LEVEL  07/10/2013   Procedure: FOR MAXIMUM ACCESS (MAS) POSTERIOR LUMBAR INTERBODY FUSION (PLIF) LUMBAR FOUR-FIVE;  Surgeon: Tia Alert, MD;  Location: [facility];  Service: Neurosurgery;;  FOR MAXIMUM ACCESS (MAS) POSTERIOR LUMBAR INTERBODY FUSION (PLIF) LUMBAR FOUR-FIVE   OOPHORECTOMY     LSO   OPEN REDUCTION INTERNAL FIXATION (ORIF) DISTAL RADIAL FRACTURE Left 09/20/2018   Procedure: OPEN REDUCTION INTERNAL FIXATION (ORIF) DISTAL RADIAL FRACTURE, LEFT;  Surgeon: Kennedy Bucker, MD;  Location: [facility];  Service: Orthopedics;  Laterality: Left;   PLANTAR FASCIA SURGERY  1999   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   VAGINAL HYSTERECTOMY  2001   for bleeding and LSO for cyst   Patient Active Problem List   Diagnosis Date Noted   Reactive hypoglycemia 02/16/2022   Prediabetes 02/16/2022   Chronic venous insufficiency 12/23/2021   Lymphedema 12/13/2021   Panic attacks    Strain of knee 12/24/2019   Vitamin B12 deficiency 10/08/2019   Arthritis 07/16/2019   Edema 07/16/2019   History of abdominal pain 07/16/2019   Low back pain 07/16/2019   Vitamin D deficiency 07/16/2019   Hypothyroidism 02/28/2019   History of gastric bypass 02/28/2019   Obstructive sleep apnea syndrome 02/28/2019   Insomnia 02/28/2019   Class 3 severe obesity due to excess calories with serious comorbidity and body mass index (BMI) of 40.0 to 44.9 in adult (HCC) 02/28/2019   Elevated BP  without diagnosis of hypertension 02/28/2019   Mixed hyperlipidemia 02/28/2019   Hot flashes 02/28/2019   BMI 40.0-44.9, adult (HCC) 12/16/2015   Hematoma of lower extremity 11/12/2015   Essential hypertension 06/20/2014   Pure hypercholesterolemia 06/20/2014   S/P lumbar spinal fusion 07/10/2013   Arthrodesis status 07/10/2013   Palpitations 01/02/2012   Edema of both legs 01/02/2012    PCP: Joni Reining, PA-C  REFERRING PROVIDER: Foster Simpson, DO  REFERRING DIAG: I89.0  THERAPY DIAG:  Lymphedema, not elsewhere classified  Rationale for Evaluation and Treatment: Rehabilitation  ONSET DATE: Insidious onset with progression over time. Greater than 20 years  SUBJECTIVE:  SUBJECTIVE STATEMENT:Mary [name] presents for initial OT visit to address BLE lipo-lymphedema. Pt is accompanied by her supportive spouse, [name]. Pt reports LE-related "heaviness"  in her legs rated at 4/10. Pt reports she and he husband, who accompanies her today, have been having some trouble getting the shoe on over the wraps.  PERTINENT HISTORY and contributing factors include, OA, HTN, Panic attacks 2/2 restrictive clothing, OSA (not using CPAP), s/p hysterectomy 1987, L knee sx, oophorectomy, s/p hysterectomy 2001, Gastric Bypass 2007, s/p plantar fascia sx 1999, obesity, insomnia  PAIN:  Are you having pain? Yes: NPRS scale: 6/10 Pain location: BLE- generalized, knees Pain description: discomfort, heavy, full, sore Aggravating factors: standing, walking, extended dependent sitting Relieving factors: elevation  PRECAUTIONS: Other: LYMPHEDEMA PRECAUTIONS: HYPOTHYROID, DIABETES SKIN PRECAUTIONS  WEIGHT BEARING RESTRICTIONS: No  FALLS:  Has patient fallen since last visit? 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  BLE COMPARATIVE LIMB VOLUMETRICS:  INTAKE: 11/01/22  LANDMARK RIGHT    R LEG (A-D) 5947.2 ml  R THIGH (E-G) 9750.0 ml  R FULL LIMB (A-G) 15697.2 ml  Limb Volume differential (LVD)  %  Volume change since initial %  Volume change overall V  (Blank rows = not tested)  LANDMARK LEFT   L LEG (A-D) 6011.6 ml  L THIGH (E-G) 9602.5 ml  L FULL LIMB (A-G) 15614.1 ml  Limb Volume differential (LVD)  LEG LVD = 1.8%, L>R % THIGH LVD = 1.5%, R>L FULL LIMB LVD = 0.53%, R>L  Volume change since initial %  Volume change overall %  (Blank rows = not tested)   TODAY'S TREATMENT:     Initial, BLE comparative limb volumetrics LLE multilevel, knee length gradient compression  bandaging                                                                                                                                 PATIENT EDUCATION:  Continued Pt/ CG edu for lymphedema self care home program throughout session. Topics include outcome of comparative limb volumetrics- starting limb volume differentials (LVDs), technology and gradient techniques used for short stretch, multilayer compression wrapping, simple self-MLD, therapeutic lymphatic pumping exercises, skin/nail care, LE precautions,. compression garment recommendations and specifications, wear and care schedule and compression garment donning / doffing w assistive devices. Discussed progress towards all OT goals since commencing CDT. All questions answered to the Pt's satisfaction. Good return.  Lymphedema management has a high burden of care, especially for Patients who have difficulty, or who are unable to reach their  distal legs and feet.  reach their feet and distal legs to apply bandages,  compression garments, to bathe , inspect skin and groom nails. In this case because Pt is unable to perform these activities, daily caregiver assistance with the lymphedema self-care home program  between OT visits is essential for achieving clinical success.  Person educated: Patient and Spouse Education method: Explanation, Demonstration, and Handouts Education comprehension: verbalized understanding, returned demonstration, and needs further education  HOME EXERCISE PROGRAM: Lymphatic Pumping Therex-  1 set of 10, 2 x daily, in order, seated  Daily skin inspection and care with low ph lotion matching skin ph to limit infection risk Simple Self-MLD 1 x daily During Intensive Phase CDT: multilayer compression wraps from foot to groin using gradient techniques, one limb at a time  to ensure safety During Self -Management Phase CDT: Fit with custom daytime compression garments- consider flat knit, Elvarex Classic, ccl 2 ( 23-32 mmHg) knee highs paired with Capri pantyhose (B-G) for full time daily use Fit with BLE knee length Jobst RELAX to limit fibrosis formation  and facilitate increased lymphatic function during HOS Custom-made gradient compression garments and HOS devices are medically necessary in this case because they are uniquely sized and shaped to fit the exact dimensions of the affected extremities, and to provide accurate and consistent gradient compression essential for optimally managing this patient's symptoms of chronic, progressive lymphedema. Multiple custom compression garments are needed for optimal hygiene. Custom compression garments should be replaced q 3-6 months When worn consistently for optimal effectiveness.  6. 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 This device follows lymphatic anatomy and provides proximal to distal stimulation above the inguinal lymph nodes to ensure optimal lymphatic return via the thoracic duct.   ASSESSMENT:  CLINICAL IMPRESSION:Continued Pt and family education for knee length, multi layer compression wrapping L leg. By end of session Pt mastered techniques and wads able to direct her spouse to correct mistakes. Next session commence MLD. Productive session.Cont as per POC.   OBJECTIVE IMPAIRMENTS: Abnormal gait, decreased activity tolerance, decreased balance, decreased knowledge of condition, decreased knowledge of use of DME, decreased mobility, difficulty walking, decreased ROM, increased edema, impaired sensation, obesity, pain, and chronic , progressive swelling and pain of buttocks, hips, abdomen and legs .   ACTIVITY LIMITATIONS: carrying, lifting, bending, sitting, standing, squatting, sleeping, stairs, transfers, bed mobility, bathing, dressing, hygiene/grooming, caring for others, and leisure pursuits, social participation, productive/ work activities  PARTICIPATION LIMITATIONS: meal prep, cleaning, laundry, interpersonal relationship, driving, shopping, community activity, occupation, yard work, and church  PERSONAL FACTORS: Age, Past/current experiences, hx of panic attacks and claustrophobia, Time since onset of injury/illness/exacerbation,, motivation, supportive spouse and family are also affecting patient's functional outcome.   REHAB POTENTIAL: Fair Unfortunately the Lipedema does not respond to CDT and is unchanged by this protocol. Fortunately lymphedema  does respond to differing degrees based on the quality and amount of fatty fibrosis present obstructing lymphatics, and patient's tolerance if skin is hypersensitive.   EVALUATION COMPLEXITY: Moderate   GOALS: Goals reviewed with patient? Yes  SHORT TERM GOALS: Target date: 4th OT Rx visit   Pt will demonstrate  understanding of lymphedema precautions and prevention strategies with modified independence using a printed reference to identify at least 5 precautions and discussing how s/he may implement them into daily life to reduce risk of progression with modified assistance ( printed reference). Baseline: Max A Goal status: INITIAL  2.  Pt will be  able to apply multilayer, thigh length, compression wraps using gradient techniques with Max caregiver assistance to decrease limb volume, to limit infection risk, and to limit lymphedema progression.  Baseline: Dependent Goal status: INITIAL  LONG TERM GOALS: Target date: 01/11/23  Given this patient's Intake score of 36.76 % on the Lymphedema Life Impact Scale (LLIS), patient will experience a reduction of at least 5 points in her perceived level of functional impairment resulting from lymphedema to improve functional performance and quality of life (QOL). Baseline: 36.76% Goal status: INITIAL  2.  Given this patient's Intake score of 56/100% on the functional outcomes FOTO tool, patient will experience an increase in function of 3 points to improve basic and instrumental ADLs performance, including lymphedema self-care.  Baseline: 56% Goal status: INITIAL  3.  During Intensive phase CDT Pt will achieve at least 85% compliance with all lymphedema self-care home program components, including  daily skin care, multilayer , gradient compression wraps with daily changes, daily simple self MLD and daily lymphatic pumping therex to achieve optimal clinical outcome and to habituate self care regime for optimal LE self-management over time. Baseline: Dependent Goal status: INITIAL  4.  Pt will achieve at least a 10% volume reductions bilaterally below the knees to return limb to more typical size and shape, to limit infection risk and LE progression, to decrease pain, to improve function, and to improve body image and QOL. Baseline: Dependent Goal status:  INITIAL  5.  Pt will be able to don and doff appropriate compression garments and devices using assistive devices and extra time within 1 week of issue date for optimal lymphedema self-care. Baseline: Dependent Goal status: INITIAL  6.  During Intensive phase CDT Pt will achieve at least 85% compliance with all lymphedema self-care home program components, including  daily skin care, multilayer , gradient compression wraps with daily changes, daily simple self MLD and daily lymphatic pumping therex to achieve optimal clinical outcome and to habituate self care regime for optimal LE self-management over time. Baseline: ax A Goal status: INITIAL   PLAN:  PT FREQUENCY: 2x/week  PT DURATION: 12  weeks other: and PRN  PLANNED INTERVENTIONS: Therapeutic exercises, Therapeutic activity, Patient/Family education, Self Care, DME instructions, Manual lymph drainage, Compression bandaging, Manual therapy, and skin care during MLD, fit with appropriate custom compression garments and devices, fit with appropriate compression device  which  follows lymphatic pathways and anatomic distribution  PLAN FOR NEXT SESSION:  BLE comparative limb volumetrics Initial knee length wrap to limb Pt deems most involved/ painful Pt/ family edu for LE self care   Loel Dubonnet, MS, OTR/L, CLT-LANA 11/10/22 3:09 PM

## 2022-11-14 ENCOUNTER — Encounter: Payer: Self-pay | Admitting: Occupational Therapy

## 2022-11-14 ENCOUNTER — Ambulatory Visit: Payer: 59 | Admitting: Occupational Therapy

## 2022-11-14 DIAGNOSIS — I890 Lymphedema, not elsewhere classified: Secondary | ICD-10-CM

## 2022-11-15 NOTE — Therapy (Signed)
OUTPATIENT OCCUPATIONAL THERAPY TREATMENT LOWER EXTREMITY LYMPHEDEMA  Patient Name: Mary [name] MRN: [id number] DOB:[birth year], 62 y.o., female Today's Date: 11/15/2022  END OF SESSION:  OT End of Session - 11/14/22 1510     Visit Number 6    Number of Visits 36    Date for OT Re-Evaluation 01/11/23    OT Start Time 0300    OT Stop Time 0410    OT Time Calculation (min) 70 min    Activity Tolerance Patient tolerated treatment well;No increased pain    Behavior During Therapy WFL for tasks assessed/performed               Past Medical History:  Diagnosis Date   Anemia    low iron at times   Arthritis    Colon polyps    Complication of anesthesia 1991   Pt reports she awoke during plantar fascia surgery and felt "everything"   Heart murmur    " small per patient"    Hyperlipidemia    Hypertension    Hypothyroidism    Lactose intolerance    PAC (premature atrial contraction)    Palpitations    Panic attacks    Pt reports restrictive clothing/areas and dry mouth cause her to have attack.   Peripheral vascular disease (HCC)    "small" clot after thermal ablasion   Pneumonia 2016   "walking pneumonia" 3 times in 2016   Shortness of breath    going upstairs (due to weight). not so much since weight loss   Sleep apnea    after weight loss does not have to use cpap   Vitamin B12 deficiency    Vitamin D deficiency    Past Surgical History:  Procedure Laterality Date   APPENDECTOMY  1985   CESAREAN SECTION  1987   twins   COLONOSCOPY     COLONOSCOPY WITH PROPOFOL N/A 02/27/2018   Procedure: COLONOSCOPY WITH PROPOFOL;  Surgeon: Toledo, Boykin Nearing, MD;  Location: [facility];  Service: Gastroenterology;  Laterality: N/A;   IRRIGATION AND DEBRIDEMENT HEMATOMA Right 11/12/2015   Procedure: IRRIGATION AND DEBRIDEMENT HEMATOMA;  Surgeon: Earline Mayotte, MD;  Location: [facility];  Service: General;  Laterality: Right;   KNEE ARTHROSCOPY WITH MEDIAL MENISECTOMY Left  07/15/2020   Procedure: Left knee arthroscopy with partial medial or lateral menisectomy, possible chondroplasty, possible partial synovectomy;  Surgeon: Lyndle Herrlich, MD;  Location: [facility];  Service: Orthopedics;  Laterality: Left;   LAMINECTOMY  07/10/2013   L4   L5   DR Yetta Barre   LAPAROSCOPIC SALPINGO OOPHERECTOMY Right 04/17/2014   Procedure: LAPAROSCOPIC SALPINGO OOPHORECTOMY RIGHT;  Surgeon: Adolphus Birchwood, MD;  Location: [facility];  Service: Gynecology;  Laterality: Right;   LYSIS OF ADHESION  04/17/2014   Procedure: LYSIS OF ADHESION;  Surgeon: Adolphus Birchwood, MD;  Location: [facility];  Service: Gynecology;;   MAXIMUM ACCESS (MAS)POSTERIOR LUMBAR INTERBODY FUSION (PLIF) 1 LEVEL  07/10/2013   Procedure: FOR MAXIMUM ACCESS (MAS) POSTERIOR LUMBAR INTERBODY FUSION (PLIF) LUMBAR FOUR-FIVE;  Surgeon: Tia Alert, MD;  Location: [facility];  Service: Neurosurgery;;  FOR MAXIMUM ACCESS (MAS) POSTERIOR LUMBAR INTERBODY FUSION (PLIF) LUMBAR FOUR-FIVE   OOPHORECTOMY     LSO   OPEN REDUCTION INTERNAL FIXATION (ORIF) DISTAL RADIAL FRACTURE Left 09/20/2018   Procedure: OPEN REDUCTION INTERNAL FIXATION (ORIF) DISTAL RADIAL FRACTURE, LEFT;  Surgeon: Kennedy Bucker, MD;  Location: [facility];  Service: Orthopedics;  Laterality: Left;   PLANTAR FASCIA SURGERY  1999   ROUX-EN-Y  GASTRIC BYPASS  06/2006   VAGINAL HYSTERECTOMY  2001   for bleeding and LSO for cyst   Patient Active Problem List   Diagnosis Date Noted   Reactive hypoglycemia 02/16/2022   Prediabetes 02/16/2022   Chronic venous insufficiency 12/23/2021   Lymphedema 12/13/2021   Panic attacks    Strain of knee 12/24/2019   Vitamin B12 deficiency 10/08/2019   Arthritis 07/16/2019   Edema 07/16/2019   History of abdominal pain 07/16/2019   Low back pain 07/16/2019   Vitamin D deficiency 07/16/2019   Hypothyroidism 02/28/2019   History of gastric bypass 02/28/2019   Obstructive sleep apnea syndrome 02/28/2019   Insomnia 02/28/2019   Class 3 severe  obesity due to excess calories with serious comorbidity and body mass index (BMI) of 40.0 to 44.9 in adult (HCC) 02/28/2019   Elevated BP without diagnosis of hypertension 02/28/2019   Mixed hyperlipidemia 02/28/2019   Hot flashes 02/28/2019   BMI 40.0-44.9, adult (HCC) 12/16/2015   Hematoma of lower extremity 11/12/2015   Essential hypertension 06/20/2014   Pure hypercholesterolemia 06/20/2014   S/P lumbar spinal fusion 07/10/2013   Arthrodesis status 07/10/2013   Palpitations 01/02/2012   Edema of both legs 01/02/2012    PCP: Joni Reining, PA-C  REFERRING PROVIDER: Foster Simpson, DO  REFERRING DIAG: I89.0  THERAPY DIAG:  Lymphedema, not elsewhere classified  Rationale for Evaluation and Treatment: Rehabilitation  ONSET DATE: Insidious onset with progression over time. Greater than 20 years  SUBJECTIVE:                                                                                                                                                                                           SUBJECTIVE STATEMENT:Mary [name] presents for initial OT visit to address BLE lipo-lymphedema. Pt is accompanied by her supportive spouse, [name]. Pt reports LE-related "heaviness"  in her legs rated at 4/10. Pt reports wraps applied at last session without foot portion were much more comfortable and did not result in foot swelling. Pt has no new complaints.  PERTINENT HISTORY and contributing factors include, OA, HTN, Panic attacks 2/2 restrictive clothing, OSA (not using CPAP), s/p hysterectomy 1987, L knee sx, oophorectomy, s/p hysterectomy 2001, Gastric Bypass 2007, s/p plantar fascia sx 1999, obesity, insomnia  PAIN:  Are you having pain? Yes: NPRS scale: 4/10 Pain location: BLE- generalized, knees Pain description: discomfort, heavy, full, sore Aggravating factors: standing, walking, extended dependent sitting Relieving factors: elevation  PRECAUTIONS: Other: LYMPHEDEMA  PRECAUTIONS: HYPOTHYROID, DIABETES SKIN PRECAUTIONS  WEIGHT BEARING RESTRICTIONS: No  FALLS:  Has patient fallen since last visit? 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  BLE COMPARATIVE LIMB VOLUMETRICS:  INTAKE: 11/01/22  LANDMARK RIGHT    R LEG (A-D) 5947.2 ml  R THIGH (E-G) 9750.0 ml  R FULL LIMB (A-G) 15697.2 ml  Limb Volume differential (LVD)  %  Volume change since initial %  Volume change overall V  (Blank rows = not tested)  LANDMARK LEFT   L LEG (A-D) 6011.6 ml  L THIGH (E-G) 9602.5 ml  L FULL LIMB (A-G) 15614.1 ml  Limb Volume differential (LVD)  LEG LVD = 1.8%, L>R % THIGH LVD = 1.5%, R>L FULL LIMB LVD = 0.53%, R>L  Volume change since initial %  Volume change overall %  (Blank rows = not tested)   TODAY'S TREATMENT:     1.MLD to LLE utilizing short neck sequence, diaphragmatic breathing , functional inguinal LN and J strokes from proximal to distal, then 3 retrograde sweeps to terminus. 2.Skin care throughout MLD using low Ph castor oil 3. LLE multilevel, knee length gradient compression  bandaging 4, PATIENT EDUCATION:  Continued Pt/ CG edu for lymphedema self care home program throughout session. Topics include outcome of comparative limb volumetrics- starting limb volume differentials (LVDs), technology and gradient techniques used for short stretch, multilayer compression wrapping, simple self-MLD, therapeutic lymphatic pumping exercises, skin/nail care, LE precautions,. compression garment recommendations and specifications, wear and care schedule and compression garment donning / doffing w assistive devices. Discussed progress towards  all OT goals since commencing CDT. All questions answered to the Pt's satisfaction. Good return.  Lymphedema management has a high burden of care, especially for Patients who have difficulty, or who are unable to reach their distal legs and feet.  reach their feet and distal legs to apply bandages, compression garments, to bathe , inspect skin and groom nails. In this case because Pt is unable to perform these activities, daily caregiver assistance with the lymphedema self-care home program  between OT visits is essential for achieving clinical success.  Person educated: Patient and Spouse Education method: Explanation, Demonstration, and Handouts Education comprehension: verbalized understanding, returned demonstration, and needs further education  HOME EXERCISE PROGRAM: Lymphatic Pumping Therex-  1 set of 10, 2 x daily, in order, seated  Daily skin inspection and care with low ph lotion matching skin ph to limit infection risk Simple Self-MLD 1 x daily During Intensive Phase CDT: multilayer compression wraps from foot to groin using gradient techniques, one limb at a time  to ensure safety During Self -Management Phase CDT: Fit with custom daytime compression garments- consider flat knit, Elvarex Classic, ccl 2 ( 23-32 mmHg) knee highs paired with Capri pantyhose (B-G) for full time daily use Fit with BLE knee length Jobst RELAX to limit fibrosis formation  and facilitate increased lymphatic function during HOS Custom-made gradient compression garments and HOS devices are medically necessary in this case because they are uniquely sized and shaped to fit the exact dimensions of the affected extremities, and to provide accurate and consistent gradient compression essential for optimally managing this patient's symptoms of chronic, progressive lymphedema. Multiple custom compression garments are needed for optimal hygiene. Custom compression garments should be replaced q 3-6 months When worn  consistently for optimal effectiveness.  6. Fit Pt with advanced Flexitouch PLUS sequential pneumatic compression device medically necessary to treat lipo-lymphedema extending above the inguinal lymph nodes, including the hips, abdomen, buttocks and abdomen. This device follows lymphatic anatomy and provides proximal to distal stimulation above the inguinal lymph nodes to ensure optimal lymphatic return via the thoracic duct.   ASSESSMENT:  CLINICAL IMPRESSION. Continued MLD and skin care as established. Manual therapy well tolerated without pain. Limb volume below the knee continues to reduce. Pt able to perform J stroke and short neck sequence without assistance today. Discussed bandaging entire limb to groin next  visit to see if Pt is able to tolerate and if thigh swelling may also reduce. OT did not push this plan intially as Pt reports claustrophobia , especially in tight fitting garments. Pt will consider and we may explore this next session. Cont as per POC.  OBJECTIVE IMPAIRMENTS: Abnormal gait, decreased activity tolerance, decreased balance, decreased knowledge of condition, decreased knowledge of use of DME, decreased mobility, difficulty walking, decreased ROM, increased edema, impaired sensation, obesity, pain, and chronic , progressive swelling and pain of buttocks, hips, abdomen and legs .   ACTIVITY LIMITATIONS: carrying, lifting, bending, sitting, standing, squatting, sleeping, stairs, transfers, bed mobility, bathing, dressing, hygiene/grooming, caring for others, and leisure pursuits, social participation, productive/ work activities  PARTICIPATION LIMITATIONS: meal prep, cleaning, laundry, interpersonal relationship, driving, shopping, community activity, occupation, yard work, and church  PERSONAL FACTORS: Age, Past/current experiences, hx of panic attacks and claustrophobia, Time since onset of injury/illness/exacerbation,, motivation, supportive spouse and family are also  affecting patient's functional outcome.   REHAB POTENTIAL: Fair Unfortunately the Lipedema does not respond to CDT and is unchanged by this protocol. Fortunately lymphedema  does respond to differing degrees based on the quality and amount of fatty fibrosis present obstructing lymphatics, and patient's tolerance if skin is hypersensitive.   EVALUATION COMPLEXITY: Moderate   GOALS: Goals reviewed with patient? Yes  SHORT TERM GOALS: Target date: 4th OT Rx visit   Pt will demonstrate understanding of lymphedema precautions and prevention strategies with modified independence using a printed reference to identify at least 5 precautions and discussing how s/he may implement them into daily life to reduce risk of progression with modified assistance ( printed reference). Baseline: Max A Goal status: INITIAL  2.  Pt will be able to apply multilayer, thigh length, compression wraps using gradient techniques with Max caregiver assistance to decrease limb volume, to limit infection risk, and to limit lymphedema progression.  Baseline: Dependent Goal status: 11/10/22 Goal Met  LONG TERM GOALS: Target date: 01/11/23  Given this patient's Intake score of 36.76 % on the Lymphedema Life Impact Scale (LLIS), patient will experience a reduction of at least 5 points in her perceived level of functional impairment resulting from lymphedema to improve functional performance and quality of life (QOL). Baseline: 36.76% Goal status: INITIAL  2.  Given this patient's Intake score of 56/100% on the functional outcomes FOTO tool, patient will experience an increase in function of 3 points to improve basic and instrumental ADLs performance, including lymphedema self-care.  Baseline: 56% Goal status: INITIAL  3.  During Intensive phase CDT Pt will achieve at least 85% compliance with all lymphedema self-care home program components, including  daily skin care, multilayer , gradient compression wraps with daily  changes, daily simple self MLD and daily lymphatic pumping therex to achieve optimal clinical outcome and to habituate self care regime for optimal LE self-management over time. Baseline: Dependent Goal status: INITIAL  4.  Pt will achieve at least a 10% volume reductions bilaterally below the knees to return limb to more typical size and shape, to limit infection risk and LE progression, to decrease pain, to improve function, and to improve body image and QOL. Baseline: Dependent Goal status: INITIAL  5.  Pt will be able to don and doff appropriate compression garments and devices using assistive devices and extra time within 1 week of issue date for optimal lymphedema self-care. Baseline: Dependent Goal status: INITIAL  6.  During Intensive phase CDT Pt will achieve at least 85% compliance with all lymphedema self-care home program components, including  daily skin care, multilayer , gradient compression wraps with daily changes, daily simple self MLD and daily lymphatic pumping therex to achieve optimal clinical outcome and to habituate self care regime for optimal LE self-management over time. Baseline: ax A Goal status: INITIAL   PLAN:  PT FREQUENCY: 2x/week  PT DURATION: 12  weeks other: and PRN  PLANNED INTERVENTIONS: Therapeutic exercises, Therapeutic activity, Patient/Family education, Self Care, DME instructions, Manual lymph drainage, Compression bandaging, Manual therapy, and skin care during MLD, fit with appropriate custom compression garments and devices, fit with appropriate compression device  which  follows lymphatic pathways and anatomic distribution  PLAN FOR NEXT SESSION:  BLE comparative limb volumetrics Initial knee length wrap to limb Pt deems most involved/ painful Pt/ family edu for LE self care   Loel Dubonnet, MS, OTR/L, CLT-LANA 11/15/22 12:38 PM  11/15/22 12:38 PM OUTPATIENT OCCUPATIONAL THERAPY TREATMENT LOWER EXTREMITY LYMPHEDEMA  Patient Name:  Mary [name] MRN: [id number] DOB:[birth year], 62 y.o., female Today's Date: 11/15/2022  END OF SESSION:  OT End of Session - 11/14/22 1510     Visit Number 6    Number of Visits 36    Date for OT Re-Evaluation 01/11/23    OT Start Time 0300    OT Stop Time 0410    OT Time Calculation (min) 70 min    Activity Tolerance Patient tolerated treatment well;No increased pain    Behavior During Therapy WFL for tasks assessed/performed               Past Medical History:  Diagnosis Date   Anemia    low iron at times   Arthritis    Colon polyps    Complication of anesthesia 1991   Pt reports she awoke during plantar fascia surgery and felt "everything"   Heart murmur    " small per patient"    Hyperlipidemia    Hypertension    Hypothyroidism    Lactose intolerance    PAC (premature atrial contraction)    Palpitations    Panic attacks    Pt reports restrictive clothing/areas and dry mouth cause her to have attack.   Peripheral vascular disease (HCC)    "small" clot after thermal ablasion   Pneumonia 2016   "walking pneumonia" 3 times in 2016   Shortness of breath    going upstairs (due to weight). not so much since weight loss   Sleep apnea    after weight loss does not have to use cpap   Vitamin B12 deficiency    Vitamin D deficiency    Past Surgical History:  Procedure Laterality Date   APPENDECTOMY  1985   CESAREAN SECTION  1987   twins   COLONOSCOPY     COLONOSCOPY WITH PROPOFOL N/A 02/27/2018   Procedure: COLONOSCOPY WITH PROPOFOL;  Surgeon: Toledo, Boykin Nearing, MD;  Location: [facility];  Service: Gastroenterology;  Laterality: N/A;   IRRIGATION AND DEBRIDEMENT HEMATOMA Right 11/12/2015   Procedure: IRRIGATION AND DEBRIDEMENT HEMATOMA;  Surgeon: Earline Mayotte, MD;  Location: [facility];  Service: General;  Laterality: Right;   KNEE ARTHROSCOPY WITH MEDIAL MENISECTOMY Left 07/15/2020   Procedure: Left knee arthroscopy with partial medial or lateral menisectomy,  possible chondroplasty, possible partial synovectomy;  Surgeon: Lyndle Herrlich, MD;  Location: [facility];  Service: Orthopedics;  Laterality: Left;   LAMINECTOMY  07/10/2013   L4   L5   DR Yetta Barre   LAPAROSCOPIC SALPINGO OOPHERECTOMY Right 04/17/2014   Procedure: LAPAROSCOPIC SALPINGO OOPHORECTOMY RIGHT;  Surgeon: Adolphus Birchwood, MD;  Location: [facility];  Service: Gynecology;  Laterality: Right;   LYSIS OF ADHESION  04/17/2014   Procedure: LYSIS OF ADHESION;  Surgeon: Adolphus Birchwood, MD;  Location: [facility];  Service: Gynecology;;   MAXIMUM ACCESS (MAS)POSTERIOR LUMBAR INTERBODY FUSION (PLIF) 1 LEVEL  07/10/2013   Procedure: FOR MAXIMUM ACCESS (MAS) POSTERIOR LUMBAR INTERBODY FUSION (PLIF) LUMBAR FOUR-FIVE;  Surgeon: Tia Alert, MD;  Location: [facility];  Service: Neurosurgery;;  FOR MAXIMUM ACCESS (MAS) POSTERIOR LUMBAR INTERBODY FUSION (PLIF) LUMBAR FOUR-FIVE   OOPHORECTOMY     LSO   OPEN REDUCTION INTERNAL FIXATION (ORIF) DISTAL RADIAL FRACTURE Left 09/20/2018   Procedure: OPEN REDUCTION INTERNAL FIXATION (ORIF) DISTAL RADIAL FRACTURE, LEFT;  Surgeon: Kennedy Bucker, MD;  Location: [facility];  Service: Orthopedics;  Laterality: Left;   PLANTAR FASCIA SURGERY  1999   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   VAGINAL HYSTERECTOMY  2001   for bleeding and LSO for cyst   Patient Active Problem List   Diagnosis Date Noted   Reactive hypoglycemia 02/16/2022   Prediabetes 02/16/2022   Chronic venous insufficiency 12/23/2021   Lymphedema 12/13/2021   Panic attacks    Strain of knee 12/24/2019   Vitamin B12 deficiency 10/08/2019   Arthritis 07/16/2019   Edema 07/16/2019   History of abdominal pain 07/16/2019   Low back pain 07/16/2019   Vitamin D deficiency 07/16/2019   Hypothyroidism 02/28/2019   History of gastric bypass 02/28/2019   Obstructive sleep apnea syndrome 02/28/2019   Insomnia 02/28/2019   Class 3 severe obesity due to excess calories with serious comorbidity and body mass index (BMI) of 40.0  to 44.9 in adult (HCC) 02/28/2019   Elevated BP without diagnosis of hypertension 02/28/2019   Mixed hyperlipidemia 02/28/2019   Hot flashes 02/28/2019   BMI 40.0-44.9, adult (HCC) 12/16/2015   Hematoma of lower extremity 11/12/2015   Essential hypertension 06/20/2014   Pure hypercholesterolemia 06/20/2014   S/P lumbar spinal fusion 07/10/2013   Arthrodesis status 07/10/2013   Palpitations 01/02/2012   Edema of both legs 01/02/2012    PCP: Joni Reining, PA-C  REFERRING PROVIDER: Foster Simpson, DO  REFERRING DIAG: I89.0  THERAPY DIAG:  Lymphedema, not elsewhere classified  Rationale for Evaluation and Treatment: Rehabilitation  ONSET DATE: Insidious onset with progression over time. Greater than 20 years  SUBJECTIVE:  SUBJECTIVE STATEMENT:Mary [name] presents for initial OT visit to address BLE lipo-lymphedema. Pt is accompanied by her supportive spouse, [name]. Pt reports LE-related "heaviness"  in her legs rated at 4/10. Pt reports she and he husband, who accompanies her today, have been having some trouble getting the shoe on over the wraps.  PERTINENT HISTORY and contributing factors include, OA, HTN, Panic attacks 2/2 restrictive clothing, OSA (not using CPAP), s/p hysterectomy 1987, L knee sx, oophorectomy, s/p hysterectomy 2001, Gastric Bypass 2007, s/p plantar fascia sx 1999, obesity, insomnia  PAIN:  Are you having pain? Yes: NPRS scale: 6/10 Pain location: BLE- generalized, knees Pain description: discomfort, heavy, full, sore Aggravating factors: standing, walking, extended dependent sitting Relieving factors: elevation  PRECAUTIONS: Other: LYMPHEDEMA PRECAUTIONS: HYPOTHYROID, DIABETES SKIN PRECAUTIONS  WEIGHT BEARING RESTRICTIONS: No  FALLS:  Has patient fallen since last  visit? 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  BLE COMPARATIVE LIMB VOLUMETRICS:  INTAKE: 11/01/22  LANDMARK RIGHT    R LEG (A-D) 5947.2 ml  R THIGH (E-G) 9750.0 ml  R FULL LIMB (A-G) 15697.2 ml  Limb Volume differential (LVD)  %  Volume change since initial %  Volume change overall V  (Blank rows = not tested)  LANDMARK LEFT   L LEG (A-D) 6011.6 ml  L THIGH (E-G) 9602.5 ml  L FULL LIMB (A-G) 15614.1 ml  Limb Volume differential (LVD)  LEG LVD = 1.8%, L>R % THIGH LVD = 1.5%, R>L FULL LIMB LVD = 0.53%, R>L  Volume change since initial %  Volume change overall %  (Blank rows = not tested)   TODAY'S TREATMENT:     Initial, BLE comparative limb volumetrics LLE multilevel, knee length gradient compression  bandaging                                                                                                                                 PATIENT EDUCATION:  Continued Pt/ CG edu for lymphedema self care home program throughout session. Topics include outcome of comparative limb volumetrics- starting limb volume differentials (LVDs), technology and gradient techniques used for short stretch, multilayer compression wrapping, simple self-MLD, therapeutic lymphatic pumping exercises, skin/nail care, LE precautions,. compression garment recommendations and specifications, wear and care schedule and compression garment donning / doffing w assistive devices. Discussed progress towards all OT goals since commencing CDT. All questions answered to the Pt's satisfaction. Good return.  Lymphedema management has a high burden of care, especially for Patients who have  difficulty, or who are unable to reach their distal legs and feet.  reach their feet and distal legs to apply bandages,  compression garments, to bathe , inspect skin and groom nails. In this case because Pt is unable to perform these activities, daily caregiver assistance with the lymphedema self-care home program  between OT visits is essential for achieving clinical success.  Person educated: Patient and Spouse Education method: Explanation, Demonstration, and Handouts Education comprehension: verbalized understanding, returned demonstration, and needs further education  HOME EXERCISE PROGRAM: Lymphatic Pumping Therex-  1 set of 10, 2 x daily, in order, seated  Daily skin inspection and care with low ph lotion matching skin ph to limit infection risk Simple Self-MLD 1 x daily During Intensive Phase CDT: multilayer compression wraps from foot to groin using gradient techniques, one limb at a time  to ensure safety During Self -Management Phase CDT: Fit with custom daytime compression garments- consider flat knit, Elvarex Classic, ccl 2 ( 23-32 mmHg) knee highs paired with Capri pantyhose (B-G) for full time daily use Fit with BLE knee length Jobst RELAX to limit fibrosis formation  and facilitate increased lymphatic function during HOS Custom-made gradient compression garments and HOS devices are medically necessary in this case because they are uniquely sized and shaped to fit the exact dimensions of the affected extremities, and to provide accurate and consistent gradient compression essential for optimally managing this patient's symptoms of chronic, progressive lymphedema. Multiple custom compression garments are needed for optimal hygiene. Custom compression garments should be replaced q 3-6 months When worn consistently for optimal effectiveness.  6. Fit Pt with advanced Flexitouch PLUS sequential pneumatic compression device medically necessary to treat lipo-lymphedema extending above the  inguinal lymph nodes, including the hips, abdomen, buttocks and abdomen. This device follows lymphatic anatomy and provides proximal to distal stimulation above the inguinal lymph nodes to ensure optimal lymphatic return via the thoracic duct.   ASSESSMENT:  CLINICAL IMPRESSION:Continued Pt and family education for knee length, multi layer compression wrapping L leg. By end of session Pt mastered techniques and wads able to direct her spouse to correct mistakes. Next session commence MLD. Productive session.Cont as per POC.   OBJECTIVE IMPAIRMENTS: Abnormal gait, decreased activity tolerance, decreased balance, decreased knowledge of condition, decreased knowledge of use of DME, decreased mobility, difficulty walking, decreased ROM, increased edema, impaired sensation, obesity, pain, and chronic , progressive swelling and pain of buttocks, hips, abdomen and legs .   ACTIVITY LIMITATIONS: carrying, lifting, bending, sitting, standing, squatting, sleeping, stairs, transfers, bed mobility, bathing, dressing, hygiene/grooming, caring for others, and leisure pursuits, social participation, productive/ work activities  PARTICIPATION LIMITATIONS: meal prep, cleaning, laundry, interpersonal relationship, driving, shopping, community activity, occupation, yard work, and church  PERSONAL FACTORS: Age, Past/current experiences, hx of panic attacks and claustrophobia, Time since onset of injury/illness/exacerbation,, motivation, supportive spouse and family are also affecting patient's functional outcome.   REHAB POTENTIAL: Fair Unfortunately the Lipedema does not respond to CDT and is unchanged by this protocol. Fortunately lymphedema  does respond to differing degrees based on the quality and amount of fatty fibrosis present obstructing lymphatics, and patient's tolerance if skin is hypersensitive.   EVALUATION COMPLEXITY: Moderate   GOALS: Goals reviewed with patient? Yes  SHORT TERM GOALS: Target  date: 4th OT Rx visit   Pt will demonstrate understanding of lymphedema precautions and prevention strategies with modified independence using a printed reference to identify at least 5 precautions and discussing how s/he may implement them into daily life to reduce risk of progression with modified assistance ( printed reference). Baseline: Max A Goal status: INITIAL  2.  Pt will be  able to apply multilayer, thigh length, compression wraps using gradient techniques with Max caregiver assistance to decrease limb volume, to limit infection risk, and to limit lymphedema progression.  Baseline: Dependent Goal status: INITIAL  LONG TERM GOALS: Target date: 01/11/23  Given this patient's Intake score of 36.76 % on the Lymphedema Life Impact Scale (LLIS), patient will experience a reduction of at least 5 points in her perceived level of functional impairment resulting from lymphedema to improve functional performance and quality of life (QOL). Baseline: 36.76% Goal status: INITIAL  2.  Given this patient's Intake score of 56/100% on the functional outcomes FOTO tool, patient will experience an increase in function of 3 points to improve basic and instrumental ADLs performance, including lymphedema self-care.  Baseline: 56% Goal status: INITIAL  3.  During Intensive phase CDT Pt will achieve at least 85% compliance with all lymphedema self-care home program components, including  daily skin care, multilayer , gradient compression wraps with daily changes, daily simple self MLD and daily lymphatic pumping therex to achieve optimal clinical outcome and to habituate self care regime for optimal LE self-management over time. Baseline: Dependent Goal status: INITIAL  4.  Pt will achieve at least a 10% volume reductions bilaterally below the knees to return limb to more typical size and shape, to limit infection risk and LE progression, to decrease pain, to improve function, and to improve body image and  QOL. Baseline: Dependent Goal status: INITIAL  5.  Pt will be able to don and doff appropriate compression garments and devices using assistive devices and extra time within 1 week of issue date for optimal lymphedema self-care. Baseline: Dependent Goal status: INITIAL  6.  During Intensive phase CDT Pt will achieve at least 85% compliance with all lymphedema self-care home program components, including  daily skin care, multilayer , gradient compression wraps with daily changes, daily simple self MLD and daily lymphatic pumping therex to achieve optimal clinical outcome and to habituate self care regime for optimal LE self-management over time. Baseline: ax A Goal status: INITIAL   PLAN:  PT FREQUENCY: 2x/week  PT DURATION: 12  weeks other: and PRN  PLANNED INTERVENTIONS: Therapeutic exercises, Therapeutic activity, Patient/Family education, Self Care, DME instructions, Manual lymph drainage, Compression bandaging, Manual therapy, and skin care during MLD, fit with appropriate custom compression garments and devices, fit with appropriate compression device  which  follows lymphatic pathways and anatomic distribution  PLAN FOR NEXT SESSION:  BLE comparative limb volumetrics Initial knee length wrap to limb Pt deems most involved/ painful Pt/ family edu for LE self care   Loel Dubonnet, MS, OTR/L, CLT-LANA 11/15/22 12:38 PM

## 2022-11-17 ENCOUNTER — Ambulatory Visit: Payer: 59 | Admitting: Occupational Therapy

## 2022-11-17 DIAGNOSIS — I890 Lymphedema, not elsewhere classified: Secondary | ICD-10-CM | POA: Diagnosis not present

## 2022-11-17 NOTE — Therapy (Signed)
OUTPATIENT OCCUPATIONAL THERAPY TREATMENT LOWER EXTREMITY LYMPHEDEMA  Patient Name: Mary [name] MRN: [id number] DOB:03/25/1961, 62 y.o., female Today's Date: 11/18/2022  END OF SESSION:  OT End of Session - 11/17/22 1413     Visit Number 7    Number of Visits 36    Date for OT Re-Evaluation 01/11/23    OT Start Time 0205    OT Stop Time 0300    OT Time Calculation (min) 55 min    Activity Tolerance Patient tolerated treatment well;No increased pain    Behavior During Therapy WFL for tasks assessed/performed               Past Medical History:  Diagnosis Date   Anemia    low iron at times   Arthritis    Colon polyps    Complication of anesthesia 1991   Pt reports she awoke during plantar fascia surgery and felt "everything"   Heart murmur    " small per patient"    Hyperlipidemia    Hypertension    Hypothyroidism    Lactose intolerance    PAC (premature atrial contraction)    Palpitations    Panic attacks    Pt reports restrictive clothing/areas and dry mouth cause her to have attack.   Peripheral vascular disease (HCC)    "small" clot after thermal ablasion   Pneumonia 2016   "walking pneumonia" 3 times in 2016   Shortness of breath    going upstairs (due to weight). not so much since weight loss   Sleep apnea    after weight loss does not have to use cpap   Vitamin B12 deficiency    Vitamin D deficiency    Past Surgical History:  Procedure Laterality Date   APPENDECTOMY  1985   CESAREAN SECTION  1987   twins   COLONOSCOPY     COLONOSCOPY WITH PROPOFOL N/A 02/27/2018   Procedure: COLONOSCOPY WITH PROPOFOL;  Surgeon: Toledo, Boykin Nearing, MD;  Location: [facility];  Service: Gastroenterology;  Laterality: N/A;   IRRIGATION AND DEBRIDEMENT HEMATOMA Right 11/12/2015   Procedure: IRRIGATION AND DEBRIDEMENT HEMATOMA;  Surgeon: Earline Mayotte, MD;  Location: [facility];  Service: General;  Laterality: Right;   KNEE ARTHROSCOPY WITH MEDIAL MENISECTOMY Left  07/15/2020   Procedure: Left knee arthroscopy with partial medial or lateral menisectomy, possible chondroplasty, possible partial synovectomy;  Surgeon: Lyndle Herrlich, MD;  Location: [facility];  Service: Orthopedics;  Laterality: Left;   LAMINECTOMY  07/10/2013   L4   L5   DR Yetta Barre   LAPAROSCOPIC SALPINGO OOPHERECTOMY Right 04/17/2014   Procedure: LAPAROSCOPIC SALPINGO OOPHORECTOMY RIGHT;  Surgeon: Adolphus Birchwood, MD;  Location: [facility];  Service: Gynecology;  Laterality: Right;   LYSIS OF ADHESION  04/17/2014   Procedure: LYSIS OF ADHESION;  Surgeon: Adolphus Birchwood, MD;  Location: [facility];  Service: Gynecology;;   MAXIMUM ACCESS (MAS)POSTERIOR LUMBAR INTERBODY FUSION (PLIF) 1 LEVEL  07/10/2013   Procedure: FOR MAXIMUM ACCESS (MAS) POSTERIOR LUMBAR INTERBODY FUSION (PLIF) LUMBAR FOUR-FIVE;  Surgeon: Tia Alert, MD;  Location: [facility];  Service: Neurosurgery;;  FOR MAXIMUM ACCESS (MAS) POSTERIOR LUMBAR INTERBODY FUSION (PLIF) LUMBAR FOUR-FIVE   OOPHORECTOMY     LSO   OPEN REDUCTION INTERNAL FIXATION (ORIF) DISTAL RADIAL FRACTURE Left 09/20/2018   Procedure: OPEN REDUCTION INTERNAL FIXATION (ORIF) DISTAL RADIAL FRACTURE, LEFT;  Surgeon: Kennedy Bucker, MD;  Location: [facility];  Service: Orthopedics;  Laterality: Left;   PLANTAR FASCIA SURGERY  1999   ROUX-EN-Y  GASTRIC BYPASS  06/2006   VAGINAL HYSTERECTOMY  2001   for bleeding and LSO for cyst   Patient Active Problem List   Diagnosis Date Noted   Reactive hypoglycemia 02/16/2022   Prediabetes 02/16/2022   Chronic venous insufficiency 12/23/2021   Lymphedema 12/13/2021   Panic attacks    Strain of knee 12/24/2019   Vitamin B12 deficiency 10/08/2019   Arthritis 07/16/2019   Edema 07/16/2019   History of abdominal pain 07/16/2019   Low back pain 07/16/2019   Vitamin D deficiency 07/16/2019   Hypothyroidism 02/28/2019   History of gastric bypass 02/28/2019   Obstructive sleep apnea syndrome 02/28/2019   Insomnia 02/28/2019   Class 3 severe  obesity due to excess calories with serious comorbidity and body mass index (BMI) of 40.0 to 44.9 in adult (HCC) 02/28/2019   Elevated BP without diagnosis of hypertension 02/28/2019   Mixed hyperlipidemia 02/28/2019   Hot flashes 02/28/2019   BMI 40.0-44.9, adult (HCC) 12/16/2015   Hematoma of lower extremity 11/12/2015   Essential hypertension 06/20/2014   Pure hypercholesterolemia 06/20/2014   S/P lumbar spinal fusion 07/10/2013   Arthrodesis status 07/10/2013   Palpitations 01/02/2012   Edema of both legs 01/02/2012    PCP: Joni Reining, PA-C  REFERRING PROVIDER: Foster Simpson, DO  REFERRING DIAG: I89.0  THERAPY DIAG:  Lymphedema, not elsewhere classified  Rationale for Evaluation and Treatment: Rehabilitation  ONSET DATE: Insidious onset with progression over time. Greater than 20 years  SUBJECTIVE:                                                                                                                                                                                           SUBJECTIVE STATEMENT:Mary [name] presents for initial OT visit to address BLE lipo-lymphedema. Pt is accompanied by her supportive spouse, Mary [name]. Pt reports LE-related "heaviness"  in her legs rated at 4/10. Pt reports wraps applied at last session without foot portion were much more comfortable and did not result in foot swelling. Pt has no new complaints.  PERTINENT HISTORY and contributing factors include, OA, HTN, Panic attacks 2/2 restrictive clothing, OSA (not using CPAP), s/p hysterectomy 1987, L knee sx, oophorectomy, s/p hysterectomy 2001, Gastric Bypass 2007, s/p plantar fascia sx 1999, obesity, insomnia  PAIN:  Are you having pain? Yes: NPRS scale: 4/10 Pain location: BLE- generalized, knees Pain description: discomfort, heavy, full, sore Aggravating factors: standing, walking, extended dependent sitting Relieving factors: elevation  PRECAUTIONS: Other: LYMPHEDEMA  PRECAUTIONS: HYPOTHYROID, DIABETES SKIN PRECAUTIONS  WEIGHT BEARING RESTRICTIONS: No  FALLS:  Has patient fallen since last visit? 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  BLE COMPARATIVE LIMB VOLUMETRICS:  INTAKE: 11/01/22  LANDMARK RIGHT    R LEG (A-D) 5947.2 ml  R THIGH (E-G) 9750.0 ml  R FULL LIMB (A-G) 15697.2 ml  Limb Volume differential (LVD)  %  Volume change since initial %  Volume change overall V  (Blank rows = not tested)  LANDMARK LEFT   L LEG (A-D) 6011.6 ml  L THIGH (E-G) 9602.5 ml  L FULL LIMB (A-G) 15614.1 ml  Limb Volume differential (LVD)  LEG LVD = 1.8%, L>R % THIGH LVD = 1.5%, R>L FULL LIMB LVD = 0.53%, R>L  Volume change since initial %  Volume change overall %  (Blank rows = not tested)   TODAY'S TREATMENT:     1.MLD to LLE utilizing short neck sequence, diaphragmatic breathing , functional inguinal LN and J strokes from proximal to distal, then 3 retrograde sweeps to terminus. 2.Skin care throughout MLD using low Ph castor oil 3. LLE multilevel, knee length gradient compression  bandaging 4, PATIENT EDUCATION:  Continued Pt/ CG edu for lymphedema self care home program throughout session. Topics include outcome of comparative limb volumetrics- starting limb volume differentials (LVDs), technology and gradient techniques used for short stretch, multilayer compression wrapping, simple self-MLD, therapeutic lymphatic pumping exercises, skin/nail care, LE precautions,. compression garment recommendations and specifications, wear and care schedule and compression garment donning / doffing w assistive devices. Discussed progress towards  all OT goals since commencing CDT. All questions answered to the Pt's satisfaction. Good return.  Lymphedema management has a high burden of care, especially for Patients who have difficulty, or who are unable to reach their distal legs and feet.  reach their feet and distal legs to apply bandages, compression garments, to bathe , inspect skin and groom nails. In this case because Pt is unable to perform these activities, daily caregiver assistance with the lymphedema self-care home program  between OT visits is essential for achieving clinical success.  Person educated: Patient and Spouse Education method: Explanation, Demonstration, and Handouts Education comprehension: verbalized understanding, returned demonstration, and needs further education  HOME EXERCISE PROGRAM: Lymphatic Pumping Therex-  1 set of 10, 2 x daily, in order, seated  Daily skin inspection and care with low ph lotion matching skin ph to limit infection risk Simple Self-MLD 1 x daily During Intensive Phase CDT: multilayer compression wraps from foot to groin using gradient techniques, one limb at a time  to ensure safety During Self -Management Phase CDT: Fit with custom daytime compression garments- consider flat knit, Elvarex Classic, ccl 2 ( 23-32 mmHg) knee highs paired with Capri pantyhose (B-G) for full time daily use Fit with BLE knee length Jobst RELAX to limit fibrosis formation  and facilitate increased lymphatic function during HOS Custom-made gradient compression garments and HOS devices are medically necessary in this case because they are uniquely sized and shaped to fit the exact dimensions of the affected extremities, and to provide accurate and consistent gradient compression essential for optimally managing this patient's symptoms of chronic, progressive lymphedema. Multiple custom compression garments are needed for optimal hygiene. Custom compression garments should be replaced q 3-6 months When worn  consistently for optimal effectiveness.  6. Fit Pt with advanced Flexitouch PLUS sequential pneumatic compression device medically necessary to treat lipo-lymphedema extending above the inguinal lymph nodes, including the hips, abdomen, buttocks and abdomen. This device follows lymphatic anatomy and provides proximal to distal stimulation above the inguinal lymph nodes to ensure optimal lymphatic return via the thoracic duct.   ASSESSMENT:  CLINICAL IMPRESSION. Continued MLD and skin care as established. Manual therapy well tolerated without pain. Limb volume below the knee continues to reduce. Cuff deformity has nearly resolved. Pt able to perform J stroke and short neck sequence with review. Cont as per POC.  OBJECTIVE IMPAIRMENTS: Abnormal gait, decreased activity tolerance, decreased balance, decreased knowledge of condition, decreased knowledge of use of DME, decreased mobility, difficulty walking, decreased ROM, increased edema, impaired sensation, obesity, pain, and chronic , progressive swelling and pain of buttocks, hips, abdomen and legs .   ACTIVITY LIMITATIONS: carrying, lifting, bending, sitting, standing, squatting, sleeping, stairs, transfers, bed mobility, bathing, dressing, hygiene/grooming, caring for others, and leisure pursuits, social participation, productive/ work activities  PARTICIPATION LIMITATIONS: meal prep, cleaning, laundry, interpersonal relationship, driving, shopping, community activity, occupation, yard work, and church  PERSONAL FACTORS: Age, Past/current experiences, hx of panic attacks and claustrophobia, Time since onset of injury/illness/exacerbation,, motivation, supportive spouse and family are also affecting patient's functional outcome.   REHAB POTENTIAL: Fair Unfortunately the Lipedema does not respond to CDT and is unchanged by this protocol. Fortunately lymphedema  does respond to differing degrees based on the quality and amount of fatty fibrosis present  obstructing lymphatics, and patient's tolerance if skin is hypersensitive.   EVALUATION COMPLEXITY: Moderate   GOALS: Goals reviewed with patient? Yes  SHORT TERM GOALS: Target date: 4th OT Rx visit   Pt will demonstrate understanding of lymphedema precautions and prevention strategies with modified independence using a printed reference to identify at least 5 precautions and discussing how s/he may implement them into daily life to reduce risk of progression with modified assistance ( printed reference). Baseline: Max A Goal status: INITIAL  2.  Pt will be able to apply multilayer, thigh length, compression wraps using gradient techniques with Max caregiver assistance to decrease limb volume, to limit infection risk, and to limit lymphedema progression.  Baseline: Dependent Goal status: 11/10/22 Goal Met  LONG TERM GOALS: Target date: 01/11/23  Given this patient's Intake score of 36.76 % on the Lymphedema Life Impact Scale (LLIS), patient will experience a reduction of at least 5 points in her perceived level of functional impairment resulting from lymphedema to improve functional performance and quality of life (QOL). Baseline: 36.76% Goal status: INITIAL  2.  Given this patient's Intake score of 56/100% on the functional outcomes FOTO tool, patient will experience an increase in function of 3 points to improve basic and instrumental ADLs performance, including lymphedema self-care.  Baseline: 56% Goal status: INITIAL  3.  During Intensive phase CDT Pt will achieve at least 85% compliance with all lymphedema self-care home program components, including  daily skin care, multilayer , gradient compression wraps with daily changes, daily simple self MLD and daily lymphatic pumping therex to achieve optimal clinical outcome and to habituate self care regime for optimal LE self-management over time. Baseline: Dependent Goal status: INITIAL  4.  Pt will achieve at least a 10% volume  reductions bilaterally below the knees to return limb to more typical size and shape, to limit infection risk and LE progression, to decrease pain, to improve function, and to improve body image and QOL. Baseline: Dependent Goal  status: INITIAL  5.  Pt will be able to don and doff appropriate compression garments and devices using assistive devices and extra time within 1 week of issue date for optimal lymphedema self-care. Baseline: Dependent Goal status: INITIAL  6.  During Intensive phase CDT Pt will achieve at least 85% compliance with all lymphedema self-care home program components, including  daily skin care, multilayer , gradient compression wraps with daily changes, daily simple self MLD and daily lymphatic pumping therex to achieve optimal clinical outcome and to habituate self care regime for optimal LE self-management over time. Baseline: ax A Goal status: INITIAL   PLAN:  PT FREQUENCY: 2x/week  PT DURATION: 12  weeks other: and PRN  PLANNED INTERVENTIONS: Therapeutic exercises, Therapeutic activity, Patient/Family education, Self Care, DME instructions, Manual lymph drainage, Compression bandaging, Manual therapy, and skin care during MLD, fit with appropriate custom compression garments and devices, fit with appropriate compression device  which  follows lymphatic pathways and anatomic distribution  PLAN FOR NEXT SESSION:  BLE comparative limb volumetrics Initial knee length wrap to limb Pt deems most involved/ painful Pt/ family edu for LE self care   Loel Dubonnet, MS, OTR/L, CLT-LANA 11/18/22 7:57 AM

## 2022-11-21 ENCOUNTER — Ambulatory Visit: Payer: 59 | Admitting: Occupational Therapy

## 2022-11-21 DIAGNOSIS — I890 Lymphedema, not elsewhere classified: Secondary | ICD-10-CM

## 2022-11-21 NOTE — Therapy (Signed)
OUTPATIENT OCCUPATIONAL THERAPY TREATMENT LOWER EXTREMITY LYMPHEDEMA  Patient Name: Mary [name] MRN: [id number] DOB:1960/10/19, 62 y.o., female Today's Date: 11/21/2022  END OF SESSION:  OT End of Session - 11/21/22 1519     Visit Number 8    Number of Visits 36    Date for OT Re-Evaluation 01/11/23    OT Start Time 0305    OT Stop Time 0406    OT Time Calculation (min) 61 min    Activity Tolerance Patient tolerated treatment well;No increased pain    Behavior During Therapy WFL for tasks assessed/performed               Past Medical History:  Diagnosis Date   Anemia    low iron at times   Arthritis    Colon polyps    Complication of anesthesia 1991   Pt reports she awoke during plantar fascia surgery and felt "everything"   Heart murmur    " small per patient"    Hyperlipidemia    Hypertension    Hypothyroidism    Lactose intolerance    PAC (premature atrial contraction)    Palpitations    Panic attacks    Pt reports restrictive clothing/areas and dry mouth cause her to have attack.   Peripheral vascular disease (HCC)    "small" clot after thermal ablasion   Pneumonia 2016   "walking pneumonia" 3 times in 2016   Shortness of breath    going upstairs (due to weight). not so much since weight loss   Sleep apnea    after weight loss does not have to use cpap   Vitamin B12 deficiency    Vitamin D deficiency    Past Surgical History:  Procedure Laterality Date   APPENDECTOMY  1985   CESAREAN SECTION  1987   twins   COLONOSCOPY     COLONOSCOPY WITH PROPOFOL N/A 02/27/2018   Procedure: COLONOSCOPY WITH PROPOFOL;  Surgeon: Toledo, Boykin Nearing, MD;  Location: [facility];  Service: Gastroenterology;  Laterality: N/A;   IRRIGATION AND DEBRIDEMENT HEMATOMA Right 11/12/2015   Procedure: IRRIGATION AND DEBRIDEMENT HEMATOMA;  Surgeon: Earline Mayotte, MD;  Location: [facility];  Service: General;  Laterality: Right;   KNEE ARTHROSCOPY WITH MEDIAL MENISECTOMY Left  07/15/2020   Procedure: Left knee arthroscopy with partial medial or lateral menisectomy, possible chondroplasty, possible partial synovectomy;  Surgeon: Lyndle Herrlich, MD;  Location: [facility];  Service: Orthopedics;  Laterality: Left;   LAMINECTOMY  07/10/2013   L4   L5   DR Yetta Barre   LAPAROSCOPIC SALPINGO OOPHERECTOMY Right 04/17/2014   Procedure: LAPAROSCOPIC SALPINGO OOPHORECTOMY RIGHT;  Surgeon: Adolphus Birchwood, MD;  Location: [facility];  Service: Gynecology;  Laterality: Right;   LYSIS OF ADHESION  04/17/2014   Procedure: LYSIS OF ADHESION;  Surgeon: Adolphus Birchwood, MD;  Location: [facility];  Service: Gynecology;;   MAXIMUM ACCESS (MAS)POSTERIOR LUMBAR INTERBODY FUSION (PLIF) 1 LEVEL  07/10/2013   Procedure: FOR MAXIMUM ACCESS (MAS) POSTERIOR LUMBAR INTERBODY FUSION (PLIF) LUMBAR FOUR-FIVE;  Surgeon: Tia Alert, MD;  Location: [facility];  Service: Neurosurgery;;  FOR MAXIMUM ACCESS (MAS) POSTERIOR LUMBAR INTERBODY FUSION (PLIF) LUMBAR FOUR-FIVE   OOPHORECTOMY     LSO   OPEN REDUCTION INTERNAL FIXATION (ORIF) DISTAL RADIAL FRACTURE Left 09/20/2018   Procedure: OPEN REDUCTION INTERNAL FIXATION (ORIF) DISTAL RADIAL FRACTURE, LEFT;  Surgeon: Kennedy Bucker, MD;  Location: [facility];  Service: Orthopedics;  Laterality: Left;   PLANTAR FASCIA SURGERY  1999   ROUX-EN-Y  GASTRIC BYPASS  06/2006   VAGINAL HYSTERECTOMY  2001   for bleeding and LSO for cyst   Patient Active Problem List   Diagnosis Date Noted   Reactive hypoglycemia 02/16/2022   Prediabetes 02/16/2022   Chronic venous insufficiency 12/23/2021   Lymphedema 12/13/2021   Panic attacks    Strain of knee 12/24/2019   Vitamin B12 deficiency 10/08/2019   Arthritis 07/16/2019   Edema 07/16/2019   History of abdominal pain 07/16/2019   Low back pain 07/16/2019   Vitamin D deficiency 07/16/2019   Hypothyroidism 02/28/2019   History of gastric bypass 02/28/2019   Obstructive sleep apnea syndrome 02/28/2019   Insomnia 02/28/2019   Class 3 severe  obesity due to excess calories with serious comorbidity and body mass index (BMI) of 40.0 to 44.9 in adult (HCC) 02/28/2019   Elevated BP without diagnosis of hypertension 02/28/2019   Mixed hyperlipidemia 02/28/2019   Hot flashes 02/28/2019   BMI 40.0-44.9, adult (HCC) 12/16/2015   Hematoma of lower extremity 11/12/2015   Essential hypertension 06/20/2014   Pure hypercholesterolemia 06/20/2014   S/P lumbar spinal fusion 07/10/2013   Arthrodesis status 07/10/2013   Palpitations 01/02/2012   Edema of both legs 01/02/2012    PCP: Joni Reining, PA-C  REFERRING PROVIDER: Foster Simpson, DO  REFERRING DIAG: I89.0  THERAPY DIAG:  Lymphedema, not elsewhere classified  Rationale for Evaluation and Treatment: Rehabilitation  ONSET DATE: Insidious onset with progression over time. Greater than 20 years  SUBJECTIVE:                                                                                                                                                                                           SUBJECTIVE STATEMENT:Mary [name] presents for initial OT visit to address BLE lipo-lymphedema. Pt is accompanied by her supportive spouse, [name]. Pt reports LE-related "heaviness"  in her legs . She does not rate pain numerically. Pt has no new complaints.  PERTINENT HISTORY and contributing factors include, OA, HTN, Panic attacks 2/2 restrictive clothing, OSA (not using CPAP), s/p hysterectomy 1987, L knee sx, oophorectomy, s/p hysterectomy 2001, Gastric Bypass 2007, s/p plantar fascia sx 1999, obesity, insomnia  PAIN:  Are you having pain? Yes: NPRS scale: not rated/10 Pain location: BLE- generalized, knees Pain description: discomfort, heavy, full, sore Aggravating factors: standing, walking, extended dependent sitting Relieving factors: elevation  PRECAUTIONS: Other: LYMPHEDEMA PRECAUTIONS: HYPOTHYROID, DIABETES SKIN PRECAUTIONS  WEIGHT BEARING RESTRICTIONS: No  FALLS:  Has  patient fallen since last visit? 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  BLE COMPARATIVE LIMB VOLUMETRICS:  INTAKE: 11/01/22  LANDMARK RIGHT    R LEG (A-D) 5947.2 ml  R THIGH (E-G) 9750.0 ml  R FULL LIMB (A-G) 15697.2 ml  Limb Volume differential (LVD)  %  Volume change since initial %  Volume change overall V  (Blank rows = not tested)  LANDMARK LEFT   L LEG (A-D) 6011.6 ml  L THIGH (E-G) 9602.5 ml  L FULL LIMB (A-G) 15614.1 ml  Limb Volume differential (LVD)  LEG LVD = 1.8%, L>R % THIGH LVD = 1.5%, R>L FULL LIMB LVD = 0.53%, R>L  Volume change since initial %  Volume change overall %  (Blank rows = not tested)   TODAY'S TREATMENT:     1.MLD to LLE utilizing short neck sequence, diaphragmatic breathing , functional inguinal LN and J strokes from proximal to distal, then 3 retrograde sweeps to terminus. 2.Skin care throughout MLD using low Ph castor oil 3. LLE multilevel, knee length gradient compression  bandaging 4, PATIENT EDUCATION:  Continued Pt/ CG edu for lymphedema self care home program throughout session. Topics include outcome of comparative limb volumetrics- starting limb volume differentials (LVDs), technology and gradient techniques used for short stretch, multilayer compression wrapping, simple self-MLD, therapeutic lymphatic pumping exercises, skin/nail care, LE precautions,. compression garment recommendations and specifications, wear and care schedule and compression garment donning / doffing w assistive devices. Discussed progress towards all OT goals since commencing CDT. All questions answered to the Pt's satisfaction. Good  return.  Lymphedema management has a high burden of care, especially for Patients who have difficulty, or who are unable to reach their distal legs and feet.  reach their feet and distal legs to apply bandages, compression garments, to bathe , inspect skin and groom nails. In this case because Pt is unable to perform these activities, daily caregiver assistance with the lymphedema self-care home program  between OT visits is essential for achieving clinical success.  Person educated: Patient and Spouse Education method: Explanation, Demonstration, and Handouts Education comprehension: verbalized understanding, returned demonstration, and needs further education  HOME EXERCISE PROGRAM: Lymphatic Pumping Therex-  1 set of 10, 2 x daily, in order, seated  Daily skin inspection and care with low ph lotion matching skin ph to limit infection risk Simple Self-MLD 1 x daily During Intensive Phase CDT: multilayer compression wraps from foot to groin using gradient techniques, one limb at a time  to ensure safety During Self -Management Phase CDT: Fit with custom daytime compression garments- consider flat knit, Elvarex Classic, ccl 2 ( 23-32 mmHg) knee highs paired with Capri pantyhose (B-G) for full time daily use Fit with BLE knee length Jobst RELAX to limit fibrosis formation  and facilitate increased lymphatic function during HOS Custom-made gradient compression garments and HOS devices are medically necessary in this case because they are uniquely sized and shaped to fit the exact dimensions of the affected extremities, and to provide accurate and consistent gradient compression essential for optimally managing this patient's symptoms of chronic, progressive lymphedema. Multiple custom compression garments are needed for optimal hygiene. Custom compression garments should be replaced q 3-6 months When worn consistently for optimal effectiveness.  6. Fit Pt with advanced Flexitouch PLUS sequential  pneumatic compression device medically necessary to treat lipo-lymphedema  extending above the inguinal lymph nodes, including the hips, abdomen, buttocks and abdomen. This device follows lymphatic anatomy and provides proximal to distal stimulation above the inguinal lymph nodes to ensure optimal lymphatic return via the thoracic duct.   ASSESSMENT:  CLINICAL IMPRESSION. Continued MLD and skin care as established. Manual therapy well tolerated without pain. Limb volume below the knee continues to reduce. Cuff deformity has nearly resolved. Pt able to perform J stroke and short neck sequence with review. Complete volumetrics and review progress towards goals next visit in prep for upcoming progress report and to determine if LLE may be reduced  enough to consider compression garment. Cont as per POC.  OBJECTIVE IMPAIRMENTS: Abnormal gait, decreased activity tolerance, decreased balance, decreased knowledge of condition, decreased knowledge of use of DME, decreased mobility, difficulty walking, decreased ROM, increased edema, impaired sensation, obesity, pain, and chronic , progressive swelling and pain of buttocks, hips, abdomen and legs .   ACTIVITY LIMITATIONS: carrying, lifting, bending, sitting, standing, squatting, sleeping, stairs, transfers, bed mobility, bathing, dressing, hygiene/grooming, caring for others, and leisure pursuits, social participation, productive/ work activities  PARTICIPATION LIMITATIONS: meal prep, cleaning, laundry, interpersonal relationship, driving, shopping, community activity, occupation, yard work, and church  PERSONAL FACTORS: Age, Past/current experiences, hx of panic attacks and claustrophobia, Time since onset of injury/illness/exacerbation,, motivation, supportive spouse and family are also affecting patient's functional outcome.   REHAB POTENTIAL: Fair Unfortunately the Lipedema does not respond to CDT and is unchanged by this protocol. Fortunately lymphedema   does respond to differing degrees based on the quality and amount of fatty fibrosis present obstructing lymphatics, and patient's tolerance if skin is hypersensitive.   EVALUATION COMPLEXITY: Moderate   GOALS: Goals reviewed with patient? Yes  SHORT TERM GOALS: Target date: 4th OT Rx visit   Pt will demonstrate understanding of lymphedema precautions and prevention strategies with modified independence using a printed reference to identify at least 5 precautions and discussing how s/he may implement them into daily life to reduce risk of progression with modified assistance ( printed reference). Baseline: Max A Goal status: INITIAL  2.  Pt will be able to apply multilayer, thigh length, compression wraps using gradient techniques with Max caregiver assistance to decrease limb volume, to limit infection risk, and to limit lymphedema progression.  Baseline: Dependent Goal status: 11/10/22 Goal Met  LONG TERM GOALS: Target date: 01/11/23  Given this patient's Intake score of 36.76 % on the Lymphedema Life Impact Scale (LLIS), patient will experience a reduction of at least 5 points in her perceived level of functional impairment resulting from lymphedema to improve functional performance and quality of life (QOL). Baseline: 36.76% Goal status: INITIAL  2.  Given this patient's Intake score of 56/100% on the functional outcomes FOTO tool, patient will experience an increase in function of 3 points to improve basic and instrumental ADLs performance, including lymphedema self-care.  Baseline: 56% Goal status: INITIAL  3.  During Intensive phase CDT Pt will achieve at least 85% compliance with all lymphedema self-care home program components, including  daily skin care, multilayer , gradient compression wraps with daily changes, daily simple self MLD and daily lymphatic pumping therex to achieve optimal clinical outcome and to habituate self care regime for optimal LE self-management over  time. Baseline: Dependent Goal status: INITIAL  4.  Pt will achieve at least a 10% volume reductions bilaterally below the knees to return limb to more typical size and shape, to limit infection risk and LE progression, to decrease pain,  to improve function, and to improve body image and QOL. Baseline: Dependent Goal status: INITIAL  5.  Pt will be able to don and doff appropriate compression garments and devices using assistive devices and extra time within 1 week of issue date for optimal lymphedema self-care. Baseline: Dependent Goal status: INITIAL  6.  During Intensive phase CDT Pt will achieve at least 85% compliance with all lymphedema self-care home program components, including  daily skin care, multilayer , gradient compression wraps with daily changes, daily simple self MLD and daily lymphatic pumping therex to achieve optimal clinical outcome and to habituate self care regime for optimal LE self-management over time. Baseline: ax A Goal status: INITIAL   PLAN:  PT FREQUENCY: 2x/week  PT DURATION: 12  weeks other: and PRN  PLANNED INTERVENTIONS: Therapeutic exercises, Therapeutic activity, Patient/Family education, Self Care, DME instructions, Manual lymph drainage, Compression bandaging, Manual therapy, and skin care during MLD, fit with appropriate custom compression garments and devices, fit with appropriate compression device  which  follows lymphatic pathways and anatomic distribution  PLAN FOR NEXT SESSION:  BLE comparative limb volumetrics Initial knee length wrap to limb Pt deems most involved/ painful Pt/ family edu for LE self care   Loel Dubonnet, MS, OTR/L, CLT-LANA 11/21/22 4:10 PM

## 2022-11-24 ENCOUNTER — Ambulatory Visit (INDEPENDENT_AMBULATORY_CARE_PROVIDER_SITE_OTHER): Payer: 59 | Admitting: Student in an Organized Health Care Education/Training Program

## 2022-11-24 ENCOUNTER — Encounter: Payer: Self-pay | Admitting: Student in an Organized Health Care Education/Training Program

## 2022-11-24 ENCOUNTER — Ambulatory Visit: Payer: 59 | Admitting: Occupational Therapy

## 2022-11-24 VITALS — BP 138/82 | HR 73 | Temp 98.3°F | Ht 66.5 in | Wt 277.4 lb

## 2022-11-24 DIAGNOSIS — R053 Chronic cough: Secondary | ICD-10-CM | POA: Diagnosis not present

## 2022-11-24 DIAGNOSIS — I890 Lymphedema, not elsewhere classified: Secondary | ICD-10-CM | POA: Diagnosis not present

## 2022-11-24 NOTE — Progress Notes (Signed)
Synopsis: Referred in for chronic cough by Joni Reining, PA-C  Assessment & Plan:   1. Chronic cough  Presents today for the evaluation of sub-acute cough since February following a viral illness. Physical exam is notable for inflamed nasal turbinates with mucus, and clear lungs on auscultation. History suggests a combination of upper airway cough syndrome.   Investigation has included a swallowing study that showed spontaneous reflux and a small hiatal hernia. With Loratadine, twice daily saline washes, and twice daily intranasal corticosteroids, her symptoms are dramatically better. Swallowing study is notable for reflux disease, which is likely contributing to the cough, especially at night. Today we discussed different strategies that she can undertake to mitigate reflux. Dinner is 4 hours before bedtime, and diet was reviewed. She will work on reducing coffee intake and tomato based products.  Furthermore, given persistent nasal and ear stuffiness, will place a referral to ENT for an evaluation and further recommendations.  Finally, given dramatic improvement in symptoms, I will hold off on further imaging or investigations.   - Ambulatory referral to ENT   Return in about 3 months (around 02/24/2023).  I spent 30 minutes caring for this patient today, including preparing to see the patient, obtaining a medical history , reviewing a separately obtained history, performing a medically appropriate examination and/or evaluation, counseling and educating the patient/family/caregiver, referring and communicating with other health care professionals (not separately reported), and documenting clinical information in the electronic health record  Raechel Chute, MD [facility] 11/24/2022 3:42 PM    End of visit medications:  No orders of the defined types were placed in this encounter.    Current Outpatient Medications:    albuterol (VENTOLIN HFA) 108 (90 Base)  MCG/ACT inhaler, Inhale 2 puffs into the lungs every 6 (six) hours as needed for wheezing or shortness of breath., Disp: 1 each, Rfl: 2   Continuous Blood Gluc Receiver (DEXCOM G7 RECEIVER) DEVI, Use to check glucose as directed, Disp: 1 each, Rfl: 0   Continuous Blood Gluc Sensor (DEXCOM G7 SENSOR) MISC, Change sensor every 10 days, Disp: 3 each, Rfl: 2   cyanocobalamin (,VITAMIN B-12,) 1000 MCG/ML injection, Inject 1 mL (1,000 mcg total) into the muscle every 14 (fourteen) days., Disp: 10 mL, Rfl: 2   EPINEPHrine 0.3 mg/0.3 mL IJ SOAJ injection, Inject 0.3 mLs into the muscle as needed., Disp: , Rfl:    fluticasone (FLONASE) 50 MCG/ACT nasal spray, Place 1 spray into both nostrils in the morning and at bedtime., Disp: 18.2 mL, Rfl: 11   hydrochlorothiazide (HYDRODIURIL) 25 MG tablet, Take 1 tablet (25 mg total) by mouth daily., Disp: 90 tablet, Rfl: 3   levothyroxine (SYNTHROID) 100 MCG tablet, Take 1 tablet (100 mcg total) by mouth daily before breakfast., Disp: 90 tablet, Rfl: 3   loratadine (CLARITIN) 10 MG tablet, Take 1 tablet (10 mg total) by mouth daily., Disp: 30 tablet, Rfl: 11   meloxicam (MOBIC) 15 MG tablet, Take by mouth daily as needed for pain., Disp: , Rfl:    phentermine 37.5 MG capsule, Take 1 capsule (37.5 mg total) by mouth every morning., Disp: 30 capsule, Rfl: 0   promethazine-dextromethorphan (PROMETHAZINE-DM) 6.25-15 MG/5ML syrup, Take 5 mLs by mouth 4 (four) times daily as needed for cough., Disp: 118 mL, Rfl: 0   simvastatin (ZOCOR) 40 MG tablet, Take 1 tablet (40 mg total) by mouth at bedtime., Disp: 90 tablet, Rfl: 3   sodium chloride (OCEAN) 0.65 % SOLN nasal spray, Place  2 sprays into both nostrils in the morning and at bedtime., Disp: 480 mL, Rfl: 6   Vitamin D, Ergocalciferol, (DRISDOL) 1.25 MG (50000 UNIT) CAPS capsule, Take 1 capsule (50,000 Units total) by mouth every 7 (seven) days., Disp: 5 capsule, Rfl: 5   zolpidem (AMBIEN CR) 12.5 MG CR tablet, Take 1 tablet  (12.5 mg total) by mouth at bedtime as needed for sleep., Disp: 30 tablet, Rfl: 5   benzonatate (TESSALON) 200 MG capsule, Take 1 capsule (200 mg total) by mouth 3 (three) times daily as needed for cough. (Patient not taking: Reported on 11/24/2022), Disp: 30 capsule, Rfl: 0   chlorpheniramine-HYDROcodone (TUSSIONEX) 10-8 MG/5ML, Take 5 mLs by mouth at bedtime as needed for cough. (Patient not taking: Reported on 11/24/2022), Disp: 75 mL, Rfl: 0   Subjective:   PATIENT ID: Mary [name] GENDER: female DOB: 09-27-1960, MRN: [id number]  Chief Complaint  Patient presents with   Follow-up    Dry cough at times prod with clear sputum mainly at night.     HPI  Patient is a pleasant 62 year old female past medical history of hypothyroidism and Roux-en-Y bypass who presents to clinic today for follow up on chronic cough.   Patient developed COVID in February 2024 (first time she's had it) after which she developed a cough that has been persistent.  She was treated for COVID with Paxlovid with improvement.  A few weeks later, her symptoms recurred and she developed a sore throat and stuffy nose with an associated cough that has persisted since then.  The cough was productive of whitish sputum, sometimes (rarely) yellow sputum.  The cough occurs throughout the day and is worse at night.  She does wake up at night from her cough.  The cough gets worse whenever she goes to bed. There have been no associated fevers or chills with this cough, no night sweats, and no weight loss.  There has been no associated chest pain or chest tightness.  Following last month's visit, we started her on nasal rinses, twice daily intranasal corticosteroids, and a second-generation antihistamine with significant improvement.  She continues to have some cough at night that does wake her up, but overall the cough is significantly improved.  The drainage from the nose is also improved.  She still has a sensation of stuffiness in her  right ear.   She has been followed by her primary care provider and multiple remedies have had partial success.  She has received a course of Bactrim which caused diarrhea 4 days into it prompting her to stop.  She has received Mucinex, Allegra, Tessalon Perles, and albuterol. She felt some improvement in her cough with a Medrol Dosepak which she was prescribed. She has never had a cough in the past and has never experienced allergies.  There is no family history or personal history of asthma.     Patient has a very distant smoking history, smoking for 3 years very lightly and quit in 2001.  She is not exposed to secondhand smoke.  She does not have any pets nor any birds.  She works as a IT sales professional.  Ancillary information including prior medications, full medical/surgical/family/social histories, and PFTs (when available) are listed below and have been reviewed.   Review of Systems  Constitutional:  Negative for chills, fever and weight loss.  Respiratory:  Positive for cough. Negative for hemoptysis, sputum production, shortness of breath and wheezing.   Cardiovascular:  Negative for chest pain.  Skin:  Negative  for rash.     Objective:   Vitals:   11/24/22 1521  BP: 138/82  Pulse: 73  Temp: 98.3 F (36.8 C)  TempSrc: Temporal  SpO2: 96%  Weight: 277 lb 6.4 oz (125.8 kg)  Height: 5' 6.5" (1.689 m)   96% on RA  BMI Readings from Last 3 Encounters:  11/24/22 44.10 kg/m  10/31/22 43.02 kg/m  09/20/22 43.56 kg/m   Wt Readings from Last 3 Encounters:  11/24/22 277 lb 6.4 oz (125.8 kg)  10/31/22 270 lb 9.6 oz (122.7 kg)  09/20/22 274 lb (124.3 kg)    Physical Exam Constitutional:      Appearance: She is obese.  HENT:     Head: Normocephalic.     Nose: Congestion present.     Mouth/Throat:     Mouth: Mucous membranes are moist.  Cardiovascular:     Rate and Rhythm: Normal rate and regular rhythm.     Heart sounds: Normal heart sounds.  Pulmonary:     Effort:  Pulmonary effort is normal.     Breath sounds: Normal breath sounds.  Abdominal:     Palpations: Abdomen is soft.  Neurological:     General: No focal deficit present.     Mental Status: She is alert and oriented to person, place, and time. Mental status is at baseline.       Ancillary Information    Past Medical History:  Diagnosis Date   Anemia    low iron at times   Arthritis    Colon polyps    Complication of anesthesia 1991   Pt reports she awoke during plantar fascia surgery and felt "everything"   Heart murmur    " small per patient"    Hyperlipidemia    Hypertension    Hypothyroidism    Lactose intolerance    PAC (premature atrial contraction)    Palpitations    Panic attacks    Pt reports restrictive clothing/areas and dry mouth cause her to have attack.   Peripheral vascular disease (HCC)    "small" clot after thermal ablasion   Pneumonia 2016   "walking pneumonia" 3 times in 2016   Shortness of breath    going upstairs (due to weight). not so much since weight loss   Sleep apnea    after weight loss does not have to use cpap   Vitamin B12 deficiency    Vitamin D deficiency      Family History  Problem Relation Age of Onset   Heart disease Mother    Lung cancer Mother    Diabetes Mother        later in life   Coronary artery disease Mother    Osteoporosis Mother    Diabetes Father    Heart disease Father    Arrhythmia Sister    Breast cancer Sister 58       diagnosed 09/09/2016   Colonic polyp Brother        pre cancer     Past Surgical History:  Procedure Laterality Date   APPENDECTOMY  1985   CESAREAN SECTION  1987   twins   COLONOSCOPY     COLONOSCOPY WITH PROPOFOL N/A 02/27/2018   Procedure: COLONOSCOPY WITH PROPOFOL;  Surgeon: Toledo, Boykin Nearing, MD;  Location: [facility];  Service: Gastroenterology;  Laterality: N/A;   IRRIGATION AND DEBRIDEMENT HEMATOMA Right 11/12/2015   Procedure: IRRIGATION AND DEBRIDEMENT HEMATOMA;  Surgeon:  Earline Mayotte, MD;  Location: [facility];  Service: General;  Laterality: Right;   KNEE ARTHROSCOPY WITH MEDIAL MENISECTOMY Left 07/15/2020   Procedure: Left knee arthroscopy with partial medial or lateral menisectomy, possible chondroplasty, possible partial synovectomy;  Surgeon: Lyndle Herrlich, MD;  Location: [facility];  Service: Orthopedics;  Laterality: Left;   LAMINECTOMY  07/10/2013   L4   L5   DR Yetta Barre   LAPAROSCOPIC SALPINGO OOPHERECTOMY Right 04/17/2014   Procedure: LAPAROSCOPIC SALPINGO OOPHORECTOMY RIGHT;  Surgeon: Adolphus Birchwood, MD;  Location: [facility];  Service: Gynecology;  Laterality: Right;   LYSIS OF ADHESION  04/17/2014   Procedure: LYSIS OF ADHESION;  Surgeon: Adolphus Birchwood, MD;  Location: [facility];  Service: Gynecology;;   MAXIMUM ACCESS (MAS)POSTERIOR LUMBAR INTERBODY FUSION (PLIF) 1 LEVEL  07/10/2013   Procedure: FOR MAXIMUM ACCESS (MAS) POSTERIOR LUMBAR INTERBODY FUSION (PLIF) LUMBAR FOUR-FIVE;  Surgeon: Tia Alert, MD;  Location: [facility];  Service: Neurosurgery;;  FOR MAXIMUM ACCESS (MAS) POSTERIOR LUMBAR INTERBODY FUSION (PLIF) LUMBAR FOUR-FIVE   OOPHORECTOMY     LSO   OPEN REDUCTION INTERNAL FIXATION (ORIF) DISTAL RADIAL FRACTURE Left 09/20/2018   Procedure: OPEN REDUCTION INTERNAL FIXATION (ORIF) DISTAL RADIAL FRACTURE, LEFT;  Surgeon: Kennedy Bucker, MD;  Location: [facility];  Service: Orthopedics;  Laterality: Left;   PLANTAR FASCIA SURGERY  1999   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   VAGINAL HYSTERECTOMY  2001   for bleeding and LSO for cyst    Social History   Socioeconomic History   Marital status: Married    Spouse name: Not on file   Number of children: 2   Years of education: Not on file   Highest education level: Not on file  Occupational History   Occupation: parking Counselling psychologist: [employer]  Tobacco Use   Smoking status: Former    Packs/day: 0.30    Years: 4.00    Additional pack years: 0.00    Total pack years: 1.20    Types:  Cigarettes    Quit date: 07/11/2002    Years since quitting: 20.3    Passive exposure: Never   Smokeless tobacco: Never  Vaping Use   Vaping Use: Never used  Substance and Sexual Activity   Alcohol use: Not Currently    Alcohol/week: 1.0 standard drink of alcohol    Types: 1 Standard drinks or equivalent per week   Drug use: No   Sexual activity: Yes    Birth control/protection: Surgical    Comment: HYST  Other Topics Concern   Not on file  Social History Narrative   Not on file   Social Determinants of Health   Financial Resource Strain: Not on file  Food Insecurity: Not on file  Transportation Needs: Not on file  Physical Activity: Not on file  Stress: Not on file  Social Connections: Not on file  Intimate Partner Violence: Not on file     Allergies  Allergen Reactions   Bee Venom Anaphylaxis   Amoxicillin    Citalopram Nausea Only   Lactose Intolerance (Gi) Diarrhea    Bloating, upset stomach   Other    Tape Other (See Comments)    Thin skin, rips off skin     CBC    Component Value Date/Time   WBC 4.8 06/16/2022 0819   WBC 7.0 07/13/2020 1115   RBC 4.99 06/16/2022 0819   RBC 4.71 07/13/2020 1115   HGB 13.8 06/16/2022 0819   HCT 40.7 06/16/2022 0819   PLT 178 06/16/2022 0819   MCV 82  06/16/2022 0819   MCV 77 (L) 11/09/2013 1156   MCH 27.7 06/16/2022 0819   MCH 28.0 07/13/2020 1115   MCHC 33.9 06/16/2022 0819   MCHC 32.3 07/13/2020 1115   RDW 14.8 06/16/2022 0819   RDW 17.7 (H) 11/09/2013 1156   LYMPHSABS 1.3 06/16/2022 0819   LYMPHSABS 2.5 11/09/2013 1156   MONOABS 0.5 11/12/2015 1056   MONOABS 1.1 (H) 11/09/2013 1156   EOSABS 0.1 06/16/2022 0819   EOSABS 0.1 11/09/2013 1156   BASOSABS 0.1 06/16/2022 0819   BASOSABS 0.1 11/09/2013 1156    Pulmonary Functions Testing Results:     No data to display          Outpatient Medications Prior to Visit  Medication Sig Dispense Refill   albuterol (VENTOLIN HFA) 108 (90 Base) MCG/ACT inhaler  Inhale 2 puffs into the lungs every 6 (six) hours as needed for wheezing or shortness of breath. 1 each 2   Continuous Blood Gluc Receiver (DEXCOM G7 RECEIVER) DEVI Use to check glucose as directed 1 each 0   Continuous Blood Gluc Sensor (DEXCOM G7 SENSOR) MISC Change sensor every 10 days 3 each 2   cyanocobalamin (,VITAMIN B-12,) 1000 MCG/ML injection Inject 1 mL (1,000 mcg total) into the muscle every 14 (fourteen) days. 10 mL 2   EPINEPHrine 0.3 mg/0.3 mL IJ SOAJ injection Inject 0.3 mLs into the muscle as needed.     fluticasone (FLONASE) 50 MCG/ACT nasal spray Place 1 spray into both nostrils in the morning and at bedtime. 18.2 mL 11   hydrochlorothiazide (HYDRODIURIL) 25 MG tablet Take 1 tablet (25 mg total) by mouth daily. 90 tablet 3   levothyroxine (SYNTHROID) 100 MCG tablet Take 1 tablet (100 mcg total) by mouth daily before breakfast. 90 tablet 3   loratadine (CLARITIN) 10 MG tablet Take 1 tablet (10 mg total) by mouth daily. 30 tablet 11   meloxicam (MOBIC) 15 MG tablet Take by mouth daily as needed for pain.     phentermine 37.5 MG capsule Take 1 capsule (37.5 mg total) by mouth every morning. 30 capsule 0   promethazine-dextromethorphan (PROMETHAZINE-DM) 6.25-15 MG/5ML syrup Take 5 mLs by mouth 4 (four) times daily as needed for cough. 118 mL 0   simvastatin (ZOCOR) 40 MG tablet Take 1 tablet (40 mg total) by mouth at bedtime. 90 tablet 3   sodium chloride (OCEAN) 0.65 % SOLN nasal spray Place 2 sprays into both nostrils in the morning and at bedtime. 480 mL 6   Vitamin D, Ergocalciferol, (DRISDOL) 1.25 MG (50000 UNIT) CAPS capsule Take 1 capsule (50,000 Units total) by mouth every 7 (seven) days. 5 capsule 5   zolpidem (AMBIEN CR) 12.5 MG CR tablet Take 1 tablet (12.5 mg total) by mouth at bedtime as needed for sleep. 30 tablet 5   benzonatate (TESSALON) 200 MG capsule Take 1 capsule (200 mg total) by mouth 3 (three) times daily as needed for cough. (Patient not taking: Reported on  11/24/2022) 30 capsule 0   chlorpheniramine-HYDROcodone (TUSSIONEX) 10-8 MG/5ML Take 5 mLs by mouth at bedtime as needed for cough. (Patient not taking: Reported on 11/24/2022) 75 mL 0   methylPREDNISolone (MEDROL DOSEPAK) 4 MG TBPK tablet Take Tapered dose as directed 21 tablet 0   No facility-administered medications prior to visit.

## 2022-11-24 NOTE — Therapy (Signed)
OUTPATIENT OCCUPATIONAL THERAPY TREATMENT LOWER EXTREMITY LYMPHEDEMA  Patient Name: JANILA SYLLA MRN: [id number] DOB:June 17, 1961, 62 y.o., female Today's Date: 11/24/2022  END OF SESSION:  OT End of Session - 11/24/22 1605     Visit Number 9    Number of Visits 36    Date for OT Re-Evaluation 01/11/23    OT Start Time 0200    OT Stop Time 0300    OT Time Calculation (min) 60 min    Activity Tolerance Patient tolerated treatment well;No increased pain    Behavior During Therapy WFL for tasks assessed/performed               Past Medical History:  Diagnosis Date   Anemia    low iron at times   Arthritis    Colon polyps    Complication of anesthesia 1991   Pt reports she awoke during plantar fascia surgery and felt "everything"   Heart murmur    " small per patient"    Hyperlipidemia    Hypertension    Hypothyroidism    Lactose intolerance    PAC (premature atrial contraction)    Palpitations    Panic attacks    Pt reports restrictive clothing/areas and dry mouth cause her to have attack.   Peripheral vascular disease (HCC)    "small" clot after thermal ablasion   Pneumonia 2016   "walking pneumonia" 3 times in 2016   Shortness of breath    going upstairs (due to weight). not so much since weight loss   Sleep apnea    after weight loss does not have to use cpap   Vitamin B12 deficiency    Vitamin D deficiency    Past Surgical History:  Procedure Laterality Date   APPENDECTOMY  1985   CESAREAN SECTION  1987   twins   COLONOSCOPY     COLONOSCOPY WITH PROPOFOL N/A 02/27/2018   Procedure: COLONOSCOPY WITH PROPOFOL;  Surgeon: Toledo, Boykin Nearing, MD;  Location: [facility];  Service: Gastroenterology;  Laterality: N/A;   IRRIGATION AND DEBRIDEMENT HEMATOMA Right 11/12/2015   Procedure: IRRIGATION AND DEBRIDEMENT HEMATOMA;  Surgeon: Earline Mayotte, MD;  Location: [facility];  Service: General;  Laterality: Right;   KNEE ARTHROSCOPY WITH MEDIAL MENISECTOMY Left  07/15/2020   Procedure: Left knee arthroscopy with partial medial or lateral menisectomy, possible chondroplasty, possible partial synovectomy;  Surgeon: Lyndle Herrlich, MD;  Location: [facility];  Service: Orthopedics;  Laterality: Left;   LAMINECTOMY  07/10/2013   L4   L5   DR Yetta Barre   LAPAROSCOPIC SALPINGO OOPHERECTOMY Right 04/17/2014   Procedure: LAPAROSCOPIC SALPINGO OOPHORECTOMY RIGHT;  Surgeon: Adolphus Birchwood, MD;  Location: [facility];  Service: Gynecology;  Laterality: Right;   LYSIS OF ADHESION  04/17/2014   Procedure: LYSIS OF ADHESION;  Surgeon: Adolphus Birchwood, MD;  Location: [facility];  Service: Gynecology;;   MAXIMUM ACCESS (MAS)POSTERIOR LUMBAR INTERBODY FUSION (PLIF) 1 LEVEL  07/10/2013   Procedure: FOR MAXIMUM ACCESS (MAS) POSTERIOR LUMBAR INTERBODY FUSION (PLIF) LUMBAR FOUR-FIVE;  Surgeon: Tia Alert, MD;  Location: [facility];  Service: Neurosurgery;;  FOR MAXIMUM ACCESS (MAS) POSTERIOR LUMBAR INTERBODY FUSION (PLIF) LUMBAR FOUR-FIVE   OOPHORECTOMY     LSO   OPEN REDUCTION INTERNAL FIXATION (ORIF) DISTAL RADIAL FRACTURE Left 09/20/2018   Procedure: OPEN REDUCTION INTERNAL FIXATION (ORIF) DISTAL RADIAL FRACTURE, LEFT;  Surgeon: Kennedy Bucker, MD;  Location: [facility];  Service: Orthopedics;  Laterality: Left;   PLANTAR FASCIA SURGERY  1999   ROUX-EN-Y  GASTRIC BYPASS  06/2006   VAGINAL HYSTERECTOMY  2001   for bleeding and LSO for cyst   Patient Active Problem List   Diagnosis Date Noted   Reactive hypoglycemia 02/16/2022   Prediabetes 02/16/2022   Chronic venous insufficiency 12/23/2021   Lymphedema 12/13/2021   Panic attacks    Strain of knee 12/24/2019   Vitamin B12 deficiency 10/08/2019   Arthritis 07/16/2019   Edema 07/16/2019   History of abdominal pain 07/16/2019   Low back pain 07/16/2019   Vitamin D deficiency 07/16/2019   Hypothyroidism 02/28/2019   History of gastric bypass 02/28/2019   Obstructive sleep apnea syndrome 02/28/2019   Insomnia 02/28/2019   Class 3 severe  obesity due to excess calories with serious comorbidity and body mass index (BMI) of 40.0 to 44.9 in adult (HCC) 02/28/2019   Elevated BP without diagnosis of hypertension 02/28/2019   Mixed hyperlipidemia 02/28/2019   Hot flashes 02/28/2019   BMI 40.0-44.9, adult (HCC) 12/16/2015   Hematoma of lower extremity 11/12/2015   Essential hypertension 06/20/2014   Pure hypercholesterolemia 06/20/2014   S/P lumbar spinal fusion 07/10/2013   Arthrodesis status 07/10/2013   Palpitations 01/02/2012   Edema of both legs 01/02/2012    PCP: Joni Reining, PA-C  REFERRING PROVIDER: Foster Simpson, DO  REFERRING DIAG: I89.0  THERAPY DIAG:  Lymphedema, not elsewhere classified  Rationale for Evaluation and Treatment: Rehabilitation  ONSET DATE: Insidious onset with progression over time. Greater than 20 years  SUBJECTIVE:                                                                                                                                                                                           SUBJECTIVE STATEMENT:Naquisha B Danese presents for initial OT visit to address BLE lipo-lymphedema. Pt is accompanied by her supportive spouse, [name]. Pt reports LE-related "heaviness"  in her legs . She does not rate pain numerically. Pt has no new complaints.  PERTINENT HISTORY and contributing factors include, OA, HTN, Panic attacks 2/2 restrictive clothing, OSA (not using CPAP), s/p hysterectomy 1987, L knee sx, oophorectomy, s/p hysterectomy 2001, Gastric Bypass 2007, s/p plantar fascia sx 1999, obesity, insomnia  PAIN:  Are you having pain? Yes: NPRS scale: not rated/10 Pain location: BLE- generalized, knees Pain description: discomfort, heavy, full, sore Aggravating factors: standing, walking, extended dependent sitting Relieving factors: elevation  PRECAUTIONS: Other: LYMPHEDEMA PRECAUTIONS: HYPOTHYROID, DIABETES SKIN PRECAUTIONS  WEIGHT BEARING RESTRICTIONS: No  FALLS:  Has  patient fallen since last visit? 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  BLE COMPARATIVE LIMB VOLUMETRICS:  INTAKE: 11/01/22  LANDMARK RIGHT    R LEG (A-D) 5947.2 ml  R THIGH (E-G) 9750.0 ml  R FULL LIMB (A-G) 15697.2 ml  Limb Volume differential (LVD)  %  Volume change since initial %  Volume change overall V  (Blank rows = not tested)  LANDMARK LEFT   L LEG (A-D) 6011.6 ml  L THIGH (E-G) 9602.5 ml  L FULL LIMB (A-G) 15614.1 ml  Limb Volume differential (LVD)  LEG LVD = 1.8%, L>R % THIGH LVD = 1.5%, R>L FULL LIMB LVD = 0.53%, R>L  Volume change since initial %  Volume change overall %  (Blank rows = not tested)   9th VISIT 11/24/22  LANDMARK LEFT   L LEG (A-D) 5918.6 ml  L THIGH (E-G) 9265.5 ml  L FULL LIMB (A-G) 15184.1  ml  Limb Volume differential (LVD)  LEG volume = Decreased 1.5%;  THIGH volume decreased 3.5 %, AND  L FULL LIMB VOLUME dec BY 2.8 % SINCE 11/01/22  Volume change since initial %  Volume change overall %    TODAY'S TREATMENT:     1.LLE comparative limb volumetrics MLD to LLE utilizing short neck sequence, diaphragmatic breathing , functional inguinal LN and J strokes from proximal to distal, then 3 retrograde sweeps to terminus. 2.Skin care throughout MLD using low Ph castor oil 3. LLE multilevel, knee length gradient compression  bandaging 4, PATIENT EDUCATION:  Continued Pt/ CG edu for lymphedema self care home program throughout session. Topics include outcome of comparative limb volumetrics- starting limb volume differentials (LVDs), technology and gradient techniques used for short stretch, multilayer compression wrapping,  simple self-MLD, therapeutic lymphatic pumping exercises, skin/nail care, LE precautions,. compression garment recommendations and specifications, wear and care schedule and compression garment donning / doffing w assistive devices. Discussed progress towards all OT goals since commencing CDT. All questions answered to the Pt's satisfaction. Good return.  Lymphedema management has a high burden of care, especially for Patients who have difficulty, or who are unable to reach their distal legs and feet.  reach their feet and distal legs to apply bandages, compression garments, to bathe , inspect skin and groom nails. In this case because Pt is unable to perform these activities, daily caregiver assistance with the lymphedema self-care home program  between OT visits is essential for achieving clinical success.  Person educated: Patient and Spouse Education method: Explanation, Demonstration, and Handouts Education comprehension: verbalized understanding, returned demonstration, and needs further education  HOME EXERCISE PROGRAM: Lymphatic Pumping Therex-  1 set of 10, 2 x daily, in order, seated  Daily skin inspection and care with low ph lotion matching skin ph to limit infection risk Simple Self-MLD 1 x daily During Intensive Phase CDT: multilayer compression wraps from foot to groin using gradient techniques, one limb at a time  to ensure safety During Self -Management Phase CDT: Fit with custom daytime compression garments- consider flat knit, Elvarex Classic, ccl 2 ( 23-32 mmHg) knee highs paired with Capri pantyhose (B-G) for full time daily use Fit with BLE knee length Jobst RELAX to limit fibrosis formation  and facilitate increased lymphatic function during HOS Custom-made gradient compression garments and HOS devices are medically necessary in this case because they are  uniquely sized and shaped to fit the exact dimensions of the affected extremities, and to provide accurate and consistent  gradient compression essential for optimally managing this patient's symptoms of chronic, progressive lymphedema. Multiple custom compression garments are needed for optimal hygiene. Custom compression garments should be replaced q 3-6 months When worn consistently for optimal effectiveness.  6. Fit Pt with advanced Flexitouch PLUS sequential pneumatic compression device medically necessary to treat lipo-lymphedema extending above the inguinal lymph nodes, including the hips, abdomen, buttocks and abdomen. This device follows lymphatic anatomy and provides proximal to distal stimulation above the inguinal lymph nodes to ensure optimal lymphatic return via the thoracic duct.   ASSESSMENT:  CLINICAL IMPRESSION. LEG volume is Decreased by  1.5%. L THIGH volume is decreased by 3.5 %, and L full limb volume is decreased by 2.8 % SINCE 11/01/22.  Cont MLD and compression wraps as established . Pt and OT agree Pt is not quite ready for garment measurements. We would like to see some more limb volume reduction . Cont as per POC.  OBJECTIVE IMPAIRMENTS: Abnormal gait, decreased activity tolerance, decreased balance, decreased knowledge of condition, decreased knowledge of use of DME, decreased mobility, difficulty walking, decreased ROM, increased edema, impaired sensation, obesity, pain, and chronic , progressive swelling and pain of buttocks, hips, abdomen and legs .   ACTIVITY LIMITATIONS: carrying, lifting, bending, sitting, standing, squatting, sleeping, stairs, transfers, bed mobility, bathing, dressing, hygiene/grooming, caring for others, and leisure pursuits, social participation, productive/ work activities  PARTICIPATION LIMITATIONS: meal prep, cleaning, laundry, interpersonal relationship, driving, shopping, community activity, occupation, yard work, and church  PERSONAL FACTORS: Age, Past/current experiences, hx of panic attacks and claustrophobia, Time since onset of injury/illness/exacerbation,,  motivation, supportive spouse and family are also affecting patient's functional outcome.   REHAB POTENTIAL: Fair Unfortunately the Lipedema does not respond to CDT and is unchanged by this protocol. Fortunately lymphedema  does respond to differing degrees based on the quality and amount of fatty fibrosis present obstructing lymphatics, and patient's tolerance if skin is hypersensitive.   EVALUATION COMPLEXITY: Moderate   GOALS: Goals reviewed with patient? Yes  SHORT TERM GOALS: Target date: 4th OT Rx visit   Pt will demonstrate understanding of lymphedema precautions and prevention strategies with modified independence using a printed reference to identify at least 5 precautions and discussing how s/he may implement them into daily life to reduce risk of progression with modified assistance ( printed reference). Baseline: Max A Goal status: INITIAL  2.  Pt will be able to apply multilayer, thigh length, compression wraps using gradient techniques with Max caregiver assistance to decrease limb volume, to limit infection risk, and to limit lymphedema progression.  Baseline: Dependent Goal status: 11/10/22 Goal Met  LONG TERM GOALS: Target date: 01/11/23  Given this patient's Intake score of 36.76 % on the Lymphedema Life Impact Scale (LLIS), patient will experience a reduction of at least 5 points in her perceived level of functional impairment resulting from lymphedema to improve functional performance and quality of life (QOL). Baseline: 36.76% Goal status: INITIAL  2.  Given this patient's Intake score of 56/100% on the functional outcomes FOTO tool, patient will experience an increase in function of 3 points to improve basic and instrumental ADLs performance, including lymphedema self-care.  Baseline: 56% Goal status: INITIAL  3.  During Intensive phase CDT Pt will achieve at least 85% compliance with all lymphedema self-care home program components, including  daily skin care,  multilayer , gradient compression wraps with daily changes,  daily simple self MLD and daily lymphatic pumping therex to achieve optimal clinical outcome and to habituate self care regime for optimal LE self-management over time. Baseline: Dependent Goal status: INITIAL  4.  Pt will achieve at least a 10% volume reductions bilaterally below the knees to return limb to more typical size and shape, to limit infection risk and LE progression, to decrease pain, to improve function, and to improve body image and QOL. Baseline: Dependent Goal status: INITIAL  5.  Pt will be able to don and doff appropriate compression garments and devices using assistive devices and extra time within 1 week of issue date for optimal lymphedema self-care. Baseline: Dependent Goal status: INITIAL  6.  During Intensive phase CDT Pt will achieve at least 85% compliance with all lymphedema self-care home program components, including  daily skin care, multilayer , gradient compression wraps with daily changes, daily simple self MLD and daily lymphatic pumping therex to achieve optimal clinical outcome and to habituate self care regime for optimal LE self-management over time. Baseline: ax A Goal status: INITIAL   PLAN:  PT FREQUENCY: 2x/week  PT DURATION: 12  weeks other: and PRN  PLANNED INTERVENTIONS: Therapeutic exercises, Therapeutic activity, Patient/Family education, Self Care, DME instructions, Manual lymph drainage, Compression bandaging, Manual therapy, and skin care during MLD, fit with appropriate custom compression garments and devices, fit with appropriate compression device  which  follows lymphatic pathways and anatomic distribution  PLAN FOR NEXT SESSION:  BLE comparative limb volumetrics Initial knee length wrap to limb Pt deems most involved/ painful Pt/ family edu for LE self care   Loel Dubonnet, MS, OTR/L, CLT-LANA 11/24/22 4:07 PM

## 2022-11-29 ENCOUNTER — Ambulatory Visit: Payer: 59 | Admitting: Occupational Therapy

## 2022-12-01 ENCOUNTER — Ambulatory Visit: Payer: 59 | Admitting: Occupational Therapy

## 2022-12-01 DIAGNOSIS — I890 Lymphedema, not elsewhere classified: Secondary | ICD-10-CM

## 2022-12-06 ENCOUNTER — Encounter: Payer: Self-pay | Admitting: Occupational Therapy

## 2022-12-06 NOTE — Therapy (Signed)
OUTPATIENT OCCUPATIONAL THERAPY TREATMENT NOTE AND PROGRESS REPORT LOWER EXTREMITY LYMPHEDEMA  Patient Name: Mary [name] MRN: [id number] DOB:[birth year], 62 y.o., female Today's Date: 12/06/2022  REPORTING PERIOD: 10/13/22 - 12/01/22  END OF SESSION:  OT End of Session - 12/06/22 1216     Visit Number 10    Number of Visits 36    Date for OT Re-Evaluation 01/11/23    OT Start Time 0200    OT Stop Time 0305    OT Time Calculation (min) 65 min    Activity Tolerance Patient tolerated treatment well;No increased pain    Behavior During Therapy WFL for tasks assessed/performed               Past Medical History:  Diagnosis Date   Anemia    low iron at times   Arthritis    Colon polyps    Complication of anesthesia 1991   Pt reports she awoke during plantar fascia surgery and felt "everything"   Heart murmur    " small per patient"    Hyperlipidemia    Hypertension    Hypothyroidism    Lactose intolerance    PAC (premature atrial contraction)    Palpitations    Panic attacks    Pt reports restrictive clothing/areas and dry mouth cause her to have attack.   Peripheral vascular disease (HCC)    "small" clot after thermal ablasion   Pneumonia 2016   "walking pneumonia" 3 times in 2016   Shortness of breath    going upstairs (due to weight). not so much since weight loss   Sleep apnea    after weight loss does not have to use cpap   Vitamin B12 deficiency    Vitamin D deficiency    Past Surgical History:  Procedure Laterality Date   APPENDECTOMY  1985   CESAREAN SECTION  1987   twins   COLONOSCOPY     COLONOSCOPY WITH PROPOFOL N/A 02/27/2018   Procedure: COLONOSCOPY WITH PROPOFOL;  Surgeon: Toledo, Boykin Nearing, MD;  Location: [facility];  Service: Gastroenterology;  Laterality: N/A;   IRRIGATION AND DEBRIDEMENT HEMATOMA Right 11/12/2015   Procedure: IRRIGATION AND DEBRIDEMENT HEMATOMA;  Surgeon: Earline Mayotte, MD;  Location: [facility];  Service: General;   Laterality: Right;   KNEE ARTHROSCOPY WITH MEDIAL MENISECTOMY Left 07/15/2020   Procedure: Left knee arthroscopy with partial medial or lateral menisectomy, possible chondroplasty, possible partial synovectomy;  Surgeon: Lyndle Herrlich, MD;  Location: [facility];  Service: Orthopedics;  Laterality: Left;   LAMINECTOMY  07/10/2013   L4   L5   DR Yetta Barre   LAPAROSCOPIC SALPINGO OOPHERECTOMY Right 04/17/2014   Procedure: LAPAROSCOPIC SALPINGO OOPHORECTOMY RIGHT;  Surgeon: Adolphus Birchwood, MD;  Location: [facility];  Service: Gynecology;  Laterality: Right;   LYSIS OF ADHESION  04/17/2014   Procedure: LYSIS OF ADHESION;  Surgeon: Adolphus Birchwood, MD;  Location: [facility];  Service: Gynecology;;   MAXIMUM ACCESS (MAS)POSTERIOR LUMBAR INTERBODY FUSION (PLIF) 1 LEVEL  07/10/2013   Procedure: FOR MAXIMUM ACCESS (MAS) POSTERIOR LUMBAR INTERBODY FUSION (PLIF) LUMBAR FOUR-FIVE;  Surgeon: Tia Alert, MD;  Location: [facility];  Service: Neurosurgery;;  FOR MAXIMUM ACCESS (MAS) POSTERIOR LUMBAR INTERBODY FUSION (PLIF) LUMBAR FOUR-FIVE   OOPHORECTOMY     LSO   OPEN REDUCTION INTERNAL FIXATION (ORIF) DISTAL RADIAL FRACTURE Left 09/20/2018   Procedure: OPEN REDUCTION INTERNAL FIXATION (ORIF) DISTAL RADIAL FRACTURE, LEFT;  Surgeon: Kennedy Bucker, MD;  Location: [facility];  Service: Orthopedics;  Laterality: Left;  PLANTAR FASCIA SURGERY  1999   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   VAGINAL HYSTERECTOMY  2001   for bleeding and LSO for cyst   Patient Active Problem List   Diagnosis Date Noted   Reactive hypoglycemia 02/16/2022   Prediabetes 02/16/2022   Chronic venous insufficiency 12/23/2021   Lymphedema 12/13/2021   Panic attacks    Strain of knee 12/24/2019   Vitamin B12 deficiency 10/08/2019   Arthritis 07/16/2019   Edema 07/16/2019   History of abdominal pain 07/16/2019   Low back pain 07/16/2019   Vitamin D deficiency 07/16/2019   Hypothyroidism 02/28/2019   History of gastric bypass 02/28/2019   Obstructive sleep  apnea syndrome 02/28/2019   Insomnia 02/28/2019   Class 3 severe obesity due to excess calories with serious comorbidity and body mass index (BMI) of 40.0 to 44.9 in adult (HCC) 02/28/2019   Elevated BP without diagnosis of hypertension 02/28/2019   Mixed hyperlipidemia 02/28/2019   Hot flashes 02/28/2019   BMI 40.0-44.9, adult (HCC) 12/16/2015   Hematoma of lower extremity 11/12/2015   Essential hypertension 06/20/2014   Pure hypercholesterolemia 06/20/2014   S/P lumbar spinal fusion 07/10/2013   Arthrodesis status 07/10/2013   Palpitations 01/02/2012   Edema of both legs 01/02/2012    PCP: Mary Reining, PA-C  REFERRING PROVIDER: Foster Simpson, DO  REFERRING DIAG: I89.0  THERAPY DIAG:  Lymphedema, not elsewhere classified  Rationale for Evaluation and Treatment: Rehabilitation  ONSET DATE: Insidious onset with progression over time. Greater than 20 years  SUBJECTIVE:                                                                                                                                                                                           SUBJECTIVE STATEMENT:Mary [name] presents for initial OT visit to address BLE lipo-lymphedema. Pt is accompanied by her supportive spouse, Mary [name]. Pt endorses LE-related pain in her legs, heaviness, tiredness, but does not rate pain numerically. Pt has no new complaints.  PERTINENT HISTORY and contributing factors include, OA, HTN, Panic attacks 2/2 restrictive clothing, OSA (not using CPAP), s/p hysterectomy 1987, L knee sx, oophorectomy, s/p hysterectomy 2001, Gastric Bypass 2007, s/p plantar fascia sx 1999, obesity, insomnia  PAIN:  Are you having pain? Yes: NPRS scale: not rated/10 Pain location: BLE- generalized, knees Pain description: discomfort, heavy, tired, full, sore Aggravating factors: standing, walking, extended dependent sitting Relieving factors: elevation  PRECAUTIONS: Other: LYMPHEDEMA PRECAUTIONS:  HYPOTHYROID, DIABETES SKIN PRECAUTIONS  WEIGHT BEARING RESTRICTIONS: No  FALLS:  Has patient fallen since last visit? 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  BLE COMPARATIVE LIMB VOLUMETRICS:  INTAKE: 11/01/22  LANDMARK RIGHT    R LEG (A-D) 5947.2 ml  R THIGH (E-G) 9750.0 ml  R FULL LIMB (A-G) 15697.2 ml  Limb Volume differential (LVD)  %  Volume change since initial %  Volume change overall V  (Blank rows = not tested)  LANDMARK LEFT   L LEG (A-D) 6011.6 ml  L THIGH (E-G) 9602.5 ml  L FULL LIMB (A-G) 15614.1 ml  Limb Volume differential (LVD)  LEG LVD = 1.8%, L>R % THIGH LVD = 1.5%, R>L FULL LIMB LVD = 0.53%, R>L  Volume change since initial %  Volume change overall %  (Blank rows = not tested)   9th VISIT 11/24/22  LANDMARK LEFT   L LEG (A-D) 5918.6 ml  L THIGH (E-G) 9265.5 ml  L FULL LIMB (A-G) 15184.1  ml  Limb Volume differential (LVD)  LEG volume = Decreased 1.5%;  THIGH volume decreased 3.5 %, AND  L FULL LIMB VOLUME dec BY 2.8 % SINCE 11/01/22  Volume change since initial %  Volume change overall %    TODAY'S TREATMENT:     1. MLD to LLE utilizing short neck sequence, diaphragmatic breathing , functional inguinal LN and J strokes from proximal to distal, then 3 retrograde sweeps to terminus. 2.Skin care throughout MLD using low Ph castor oil 3. LLE multilevel, knee length gradient compression  bandaging 4, PATIENT EDUCATION: Reviewed progress towards goals to date. Continued Pt/ CG edu for lymphedema self care home program throughout session. Topics include outcome of comparative limb volumetrics- starting limb volume differentials  (LVDs), technology and gradient techniques used for short stretch, multilayer compression wrapping, simple self-MLD, therapeutic lymphatic pumping exercises, skin/nail care, LE precautions,. compression garment recommendations and specifications, wear and care schedule and compression garment donning / doffing w assistive devices. Discussed progress towards all OT goals since commencing CDT. All questions answered to the Pt's satisfaction. Good return.  Lymphedema management has a high burden of care, especially for Patients who have difficulty, or who are unable to reach their distal legs and feet.  reach their feet and distal legs to apply bandages, compression garments, to bathe , inspect skin and groom nails. In this case because Pt is unable to perform these activities, daily caregiver assistance with the lymphedema self-care home program  between OT visits is essential for achieving clinical success.  Person educated: Patient and Spouse Education method: Explanation, Demonstration, and Handouts Education comprehension: verbalized understanding, returned demonstration, and needs further education  HOME EXERCISE PROGRAM: Lymphatic Pumping Therex-  1 set of 10, 2 x daily, in order, seated  Daily skin inspection and care with low ph lotion matching skin ph to limit infection risk Simple Self-MLD 1 x daily During Intensive Phase CDT: multilayer compression wraps from foot to groin using gradient techniques, one limb at a time  to ensure safety During Self -Management Phase CDT: Fit with custom daytime compression garments- consider flat knit, Elvarex Classic, ccl 2 ( 23-32 mmHg) knee highs paired with Capri pantyhose (B-G) for full time daily use Fit with BLE knee length Jobst RELAX to limit fibrosis formation  and facilitate increased lymphatic function during HOS Custom-made gradient compression garments and  HOS devices are medically necessary in this case because they are uniquely sized and shaped  to fit the exact dimensions of the affected extremities, and to provide accurate and consistent gradient compression essential for optimally managing this patient's symptoms of chronic, progressive lymphedema. Multiple custom compression garments are needed for optimal hygiene. Custom compression garments should be replaced q 3-6 months When worn consistently for optimal effectiveness. 6. Fit Pt with advanced Flexitouch PLUS sequential pneumatic compression device medically necessary to treat lipo-lymphedema extending above the inguinal lymph nodes, including the hips, abdomen, buttocks and abdomen. This device follows lymphatic anatomy and provides proximal to distal stimulation above the inguinal lymph nodes to ensure optimal lymphatic return via the thoracic duct.   ASSESSMENT:  CLINICAL IMPRESSION. Progress towards L leg limb volume reduction is slow despite excellent tolerance of compression and manual therapy, good compliance with home program components, and consistent support from spouse. To date Left lower extremity volume is decreased by 1.5% in the leg below the knee, by 3.5% overall in  the L THIGH, and by 2.8% in the full limb since initially measured on 11/01/22. Pt and spouse together have mastered compression wrapping the L leg and LE-related L leg pain is reduced by report. While some patients do respond more quickly and with more dramatic limb volume reduction, for Pts presenting with lipo-lymphedema, like Mrs Raia,  volume reduction is slower and less dramatic, and pain reduction and tissue containment become the optimal goals. Please see LONG AND SHORT TERM GOALS sections for additional details of progress to date. After goals review the remainder of our visit today was dedicated to MLD, skin care and compression wraps to L leg . Pt and OT agree Pt is not quite ready for garment measurements and we'll continue CDT a bit longer and re measure in 5 or so visits. Cont as per POC.  OBJECTIVE  IMPAIRMENTS: Abnormal gait, decreased activity tolerance, decreased balance, decreased knowledge of condition, decreased knowledge of use of DME, decreased mobility, difficulty walking, decreased ROM, increased edema, impaired sensation, obesity, pain, and chronic , progressive swelling and pain of buttocks, hips, abdomen and legs .   ACTIVITY LIMITATIONS: carrying, lifting, bending, sitting, standing, squatting, sleeping, stairs, transfers, bed mobility, bathing, dressing, hygiene/grooming, caring for others, and leisure pursuits, social participation, productive/ work activities  PARTICIPATION LIMITATIONS: meal prep, cleaning, laundry, interpersonal relationship, driving, shopping, community activity, occupation, yard work, and church  PERSONAL FACTORS: Age, Past/current experiences, hx of panic attacks and claustrophobia, Time since onset of injury/illness/exacerbation,, motivation, supportive spouse and family are also affecting patient's functional outcome.   REHAB POTENTIAL: Fair Unfortunately the Lipedema does not respond to CDT and is unchanged by this protocol. Fortunately lymphedema  does respond to differing degrees based on the quality and amount of fatty fibrosis present obstructing lymphatics, and patient's tolerance if skin is hypersensitive.   EVALUATION COMPLEXITY: Moderate   GOALS: Goals reviewed with patient? Yes  SHORT TERM GOALS: Target date: 4th OT Rx visit   Pt will demonstrate understanding of lymphedema precautions and prevention strategies with modified independence using a printed reference to identify at least 5 precautions and discussing how s/he may implement them into daily life to reduce risk of progression with modified assistance ( printed reference). Baseline: Max A Goal status: INITIAL  2.  Pt will be able to apply multilayer, thigh length, compression wraps using gradient techniques with Max caregiver assistance to decrease limb volume, to limit infection  risk, and to limit lymphedema progression.  Baseline:  Dependent Goal status: 11/10/22 Goal Met  LONG TERM GOALS: Target date: 01/11/23  Given this patient's Intake score of 36.76 % on the Lymphedema Life Impact Scale (LLIS), patient will experience a reduction of at least 5 points in her perceived level of functional impairment resulting from lymphedema to improve functional performance and quality of life (QOL). Baseline: 36.76% Goal status: INITIAL  2.  Given this patient's Intake score of 56/100% on the functional outcomes FOTO tool, patient will experience an increase in function of 3 points to improve basic and instrumental ADLs performance, including lymphedema self-care.  Baseline: 56% Goal status: INITIAL  3.  During Intensive phase CDT Pt will achieve at least 85% compliance with all lymphedema self-care home program components, including  daily skin care, multilayer , gradient compression wraps with daily changes, daily simple self MLD and daily lymphatic pumping therex to achieve optimal clinical outcome and to habituate self care regime for optimal LE self-management over time. Baseline: Dependent Goal status: INITIAL  4.  Pt will achieve at least a 10% volume reductions bilaterally below the knees to return limb to more typical size and shape, to limit infection risk and LE progression, to decrease pain, to improve function, and to improve body image and QOL. Baseline: Dependent Goal status: INITIAL  5.  Pt will be able to don and doff appropriate compression garments and devices using assistive devices and extra time within 1 week of issue date for optimal lymphedema self-care. Baseline: Dependent Goal status: INITIAL  6.  During Intensive phase CDT Pt will achieve at least 85% compliance with all lymphedema self-care home program components, including  daily skin care, multilayer , gradient compression wraps with daily changes, daily simple self MLD and daily lymphatic pumping  therex to achieve optimal clinical outcome and to habituate self care regime for optimal LE self-management over time. Baseline: ax A Goal status: INITIAL   PLAN:  PT FREQUENCY: 2x/week  PT DURATION: 12  weeks other: and PRN  PLANNED INTERVENTIONS: Therapeutic exercises, Therapeutic activity, Patient/Family education, Self Care, DME instructions, Manual lymph drainage, Compression bandaging, Manual therapy, and skin care during MLD, fit with appropriate custom compression garments and devices, fit with appropriate compression device  which  follows lymphatic pathways and anatomic distribution  PLAN FOR NEXT SESSION:  BLE comparative limb volumetrics Initial knee length wrap to limb Pt deems most involved/ painful Pt/ family edu for LE self care   Loel Dubonnet, MS, OTR/L, CLT-LANA 12/06/22 12:18 PM

## 2022-12-07 ENCOUNTER — Ambulatory Visit: Payer: 59 | Admitting: Occupational Therapy

## 2022-12-07 DIAGNOSIS — I890 Lymphedema, not elsewhere classified: Secondary | ICD-10-CM | POA: Diagnosis not present

## 2022-12-07 NOTE — Therapy (Signed)
OUTPATIENT OCCUPATIONAL THERAPY TREATMENT LOWER EXTREMITY LYMPHEDEMA  Patient Name: Mary [name] MRN: [id number] DOB:08-10-1960, 62 y.o., female Today's Date: 12/07/2022    END OF SESSION:  OT End of Session - 12/07/22 1514     Visit Number 11    Number of Visits 36    Date for OT Re-Evaluation 01/11/23    OT Start Time 0300    OT Stop Time 0400    OT Time Calculation (min) 60 min    Activity Tolerance Patient tolerated treatment well;No increased pain    Behavior During Therapy WFL for tasks assessed/performed               Past Medical History:  Diagnosis Date   Anemia    low iron at times   Arthritis    Colon polyps    Complication of anesthesia 1991   Pt reports she awoke during plantar fascia surgery and felt "everything"   Heart murmur    " small per patient"    Hyperlipidemia    Hypertension    Hypothyroidism    Lactose intolerance    PAC (premature atrial contraction)    Palpitations    Panic attacks    Pt reports restrictive clothing/areas and dry mouth cause her to have attack.   Peripheral vascular disease (HCC)    "small" clot after thermal ablasion   Pneumonia 2016   "walking pneumonia" 3 times in 2016   Shortness of breath    going upstairs (due to weight). not so much since weight loss   Sleep apnea    after weight loss does not have to use cpap   Vitamin B12 deficiency    Vitamin D deficiency    Past Surgical History:  Procedure Laterality Date   APPENDECTOMY  1985   CESAREAN SECTION  1987   twins   COLONOSCOPY     COLONOSCOPY WITH PROPOFOL N/A 02/27/2018   Procedure: COLONOSCOPY WITH PROPOFOL;  Surgeon: Toledo, Boykin Nearing, MD;  Location: [facility];  Service: Gastroenterology;  Laterality: N/A;   IRRIGATION AND DEBRIDEMENT HEMATOMA Right 11/12/2015   Procedure: IRRIGATION AND DEBRIDEMENT HEMATOMA;  Surgeon: Earline Mayotte, MD;  Location: [facility];  Service: General;  Laterality: Right;   KNEE ARTHROSCOPY WITH MEDIAL MENISECTOMY  Left 07/15/2020   Procedure: Left knee arthroscopy with partial medial or lateral menisectomy, possible chondroplasty, possible partial synovectomy;  Surgeon: Lyndle Herrlich, MD;  Location: [facility];  Service: Orthopedics;  Laterality: Left;   LAMINECTOMY  07/10/2013   L4   L5   DR Yetta Barre   LAPAROSCOPIC SALPINGO OOPHERECTOMY Right 04/17/2014   Procedure: LAPAROSCOPIC SALPINGO OOPHORECTOMY RIGHT;  Surgeon: Adolphus Birchwood, MD;  Location: [facility];  Service: Gynecology;  Laterality: Right;   LYSIS OF ADHESION  04/17/2014   Procedure: LYSIS OF ADHESION;  Surgeon: Adolphus Birchwood, MD;  Location: [facility];  Service: Gynecology;;   MAXIMUM ACCESS (MAS)POSTERIOR LUMBAR INTERBODY FUSION (PLIF) 1 LEVEL  07/10/2013   Procedure: FOR MAXIMUM ACCESS (MAS) POSTERIOR LUMBAR INTERBODY FUSION (PLIF) LUMBAR FOUR-FIVE;  Surgeon: Tia Alert, MD;  Location: [facility];  Service: Neurosurgery;;  FOR MAXIMUM ACCESS (MAS) POSTERIOR LUMBAR INTERBODY FUSION (PLIF) LUMBAR FOUR-FIVE   OOPHORECTOMY     LSO   OPEN REDUCTION INTERNAL FIXATION (ORIF) DISTAL RADIAL FRACTURE Left 09/20/2018   Procedure: OPEN REDUCTION INTERNAL FIXATION (ORIF) DISTAL RADIAL FRACTURE, LEFT;  Surgeon: Kennedy Bucker, MD;  Location: [facility];  Service: Orthopedics;  Laterality: Left;   PLANTAR FASCIA SURGERY  1999  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   VAGINAL HYSTERECTOMY  2001   for bleeding and LSO for cyst   Patient Active Problem List   Diagnosis Date Noted   Reactive hypoglycemia 02/16/2022   Prediabetes 02/16/2022   Chronic venous insufficiency 12/23/2021   Lymphedema 12/13/2021   Panic attacks    Strain of knee 12/24/2019   Vitamin B12 deficiency 10/08/2019   Arthritis 07/16/2019   Edema 07/16/2019   History of abdominal pain 07/16/2019   Low back pain 07/16/2019   Vitamin D deficiency 07/16/2019   Hypothyroidism 02/28/2019   History of gastric bypass 02/28/2019   Obstructive sleep apnea syndrome 02/28/2019   Insomnia 02/28/2019   Class 3  severe obesity due to excess calories with serious comorbidity and body mass index (BMI) of 40.0 to 44.9 in adult (HCC) 02/28/2019   Elevated BP without diagnosis of hypertension 02/28/2019   Mixed hyperlipidemia 02/28/2019   Hot flashes 02/28/2019   BMI 40.0-44.9, adult (HCC) 12/16/2015   Hematoma of lower extremity 11/12/2015   Essential hypertension 06/20/2014   Pure hypercholesterolemia 06/20/2014   S/P lumbar spinal fusion 07/10/2013   Arthrodesis status 07/10/2013   Palpitations 01/02/2012   Edema of both legs 01/02/2012    PCP: Joni Reining, PA-C  REFERRING PROVIDER: Foster Simpson, DO  REFERRING DIAG: I89.0  THERAPY DIAG:  Lymphedema, not elsewhere classified  Rationale for Evaluation and Treatment: Rehabilitation  ONSET DATE: Insidious onset with progression over time. Greater than 20 years  SUBJECTIVE:                                                                                                                                                                                           SUBJECTIVE STATEMENT:Mary [name] presents for initial OT visit to address BLE lipo-lymphedema. Pt is accompanied by her supportive spouse, Mary [name]. Pt endorses LE-related pain in her legs, heaviness, tiredness, but does not rate pain numerically. Pt has no new complaints.  PERTINENT HISTORY and contributing factors include, OA, HTN, Panic attacks 2/2 restrictive clothing, OSA (not using CPAP), s/p hysterectomy 1987, L knee sx, oophorectomy, s/p hysterectomy 2001, Gastric Bypass 2007, s/p plantar fascia sx 1999, obesity, insomnia  PAIN:  Are you having pain? Yes: NPRS scale: not rated/10 Pain location: BLE- generalized, knees Pain description: discomfort, heavy, tired, full, sore Aggravating factors: standing, walking, extended dependent sitting Relieving factors: elevation  PRECAUTIONS: Other: LYMPHEDEMA PRECAUTIONS: HYPOTHYROID, DIABETES SKIN PRECAUTIONS  WEIGHT BEARING  RESTRICTIONS: No  FALLS:  Has patient fallen since last visit? 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  BLE COMPARATIVE LIMB VOLUMETRICS:  INTAKE: 11/01/22  LANDMARK RIGHT    R LEG (A-D) 5947.2 ml  R THIGH (E-G) 9750.0 ml  R FULL LIMB (A-G) 15697.2 ml  Limb Volume differential (LVD)  %  Volume change since initial %  Volume change overall V  (Blank rows = not tested)  LANDMARK LEFT   L LEG (A-D) 6011.6 ml  L THIGH (E-G) 9602.5 ml  L FULL LIMB (A-G) 15614.1 ml  Limb Volume differential (LVD)  LEG LVD = 1.8%, L>R % THIGH LVD = 1.5%, R>L FULL LIMB LVD = 0.53%, R>L  Volume change since initial %  Volume change overall %  (Blank rows = not tested)   9th VISIT 11/24/22  LANDMARK LEFT   L LEG (A-D) 5918.6 ml  L THIGH (E-G) 9265.5 ml  L FULL LIMB (A-G) 15184.1  ml  Limb Volume differential (LVD)  LEG volume = Decreased 1.5%;  THIGH volume decreased 3.5 %, AND  L FULL LIMB VOLUME dec BY 2.8 % SINCE 11/01/22  Volume change since initial %  Volume change overall %    TODAY'S TREATMENT:     1. MLD to LLE utilizing short neck sequence, diaphragmatic breathing , functional inguinal LN and J strokes from proximal to distal, then 3 retrograde sweeps to terminus. 2.Skin care throughout MLD using low Ph castor oil 3. LLE multilevel, knee length gradient compression  bandaging 4, PATIENT EDUCATION: Reviewed progress towards goals to date. Continued Pt/ CG edu for lymphedema self care home program throughout session. Topics include outcome of comparative limb volumetrics- starting limb volume differentials (LVDs), technology and gradient techniques used for short  stretch, multilayer compression wrapping, simple self-MLD, therapeutic lymphatic pumping exercises, skin/nail care, LE precautions,. compression garment recommendations and specifications, wear and care schedule and compression garment donning / doffing w assistive devices. Discussed progress towards all OT goals since commencing CDT. All questions answered to the Pt's satisfaction. Good return.  Lymphedema management has a high burden of care, especially for Patients who have difficulty, or who are unable to reach their distal legs and feet.  reach their feet and distal legs to apply bandages, compression garments, to bathe , inspect skin and groom nails. In this case because Pt is unable to perform these activities, daily caregiver assistance with the lymphedema self-care home program  between OT visits is essential for achieving clinical success.  Person educated: Patient and Spouse Education method: Explanation, Demonstration, and Handouts Education comprehension: verbalized understanding, returned demonstration, and needs further education  HOME EXERCISE PROGRAM: Lymphatic Pumping Therex-  1 set of 10, 2 x daily, in order, seated  Daily skin inspection and care with low ph lotion matching skin ph to limit infection risk Simple Self-MLD 1 x daily During Intensive Phase CDT: multilayer compression wraps from foot to groin using gradient techniques, one limb at a time  to ensure safety During Self -Management Phase CDT: Fit with custom daytime compression garments- consider flat knit, Elvarex Classic, ccl 2 ( 23-32 mmHg) knee highs paired with Capri pantyhose (B-G) for full time daily use Fit with BLE knee length Jobst RELAX to limit fibrosis formation  and facilitate increased lymphatic function during HOS Custom-made gradient compression garments and HOS devices are medically necessary in this  case because they are uniquely sized and shaped to fit the exact dimensions of the affected extremities,  and to provide accurate and consistent gradient compression essential for optimally managing this patient's symptoms of chronic, progressive lymphedema. Multiple custom compression garments are needed for optimal hygiene. Custom compression garments should be replaced q 3-6 months When worn consistently for optimal effectiveness. 6. Fit Pt with advanced Flexitouch PLUS sequential pneumatic compression device medically necessary to treat lipo-lymphedema extending above the inguinal lymph nodes, including the hips, abdomen, buttocks and abdomen. This device follows lymphatic anatomy and provides proximal to distal stimulation above the inguinal lymph nodes to ensure optimal lymphatic return via the thoracic duct.   ASSESSMENT:  CLINICAL IMPRESSION. Pt tolerated LLE/LLQ MLD with simultaneous skin care without increased PAIN. Pt states although she continues to have some pain in her L leg, I.e h"heaviness", she notices a change in shape of her legs that she is happy with.  Limb volume reduction appears to be approaching clinical plateau and Pt agrees w plan to complete BLE garment / device specifications, and LLE compression garment measurements next visit to ensure she obtains    these using insurance for this benefit year before it changes in July. She states she believes she has met, or nearly met deductible.  Cont as per POC.   OBJECTIVE IMPAIRMENTS: Abnormal gait, decreased activity tolerance, decreased balance, decreased knowledge of condition, decreased knowledge of use of DME, decreased mobility, difficulty walking, decreased ROM, increased edema, impaired sensation, obesity, pain, and chronic , progressive swelling and pain of buttocks, hips, abdomen and legs .   ACTIVITY LIMITATIONS: carrying, lifting, bending, sitting, standing, squatting, sleeping, stairs, transfers, bed mobility, bathing, dressing, hygiene/grooming, caring for others, and leisure pursuits, social participation, productive/ work  activities  PARTICIPATION LIMITATIONS: meal prep, cleaning, laundry, interpersonal relationship, driving, shopping, community activity, occupation, yard work, and church  PERSONAL FACTORS: Age, Past/current experiences, hx of panic attacks and claustrophobia, Time since onset of injury/illness/exacerbation,, motivation, supportive spouse and family are also affecting patient's functional outcome.   REHAB POTENTIAL: Fair Unfortunately the Lipedema does not respond to CDT and is unchanged by this protocol. Fortunately lymphedema  does respond to differing degrees based on the quality and amount of fatty fibrosis present obstructing lymphatics, and patient's tolerance if skin is hypersensitive.   EVALUATION COMPLEXITY: Moderate   GOALS: Goals reviewed with patient? Yes  SHORT TERM GOALS: Target date: 4th OT Rx visit   Pt will demonstrate understanding of lymphedema precautions and prevention strategies with modified independence using a printed reference to identify at least 5 precautions and discussing how s/he may implement them into daily life to reduce risk of progression with modified assistance ( printed reference). Baseline: Max A Goal status: INITIAL  2.  Pt will be able to apply multilayer, thigh length, compression wraps using gradient techniques with Max caregiver assistance to decrease limb volume, to limit infection risk, and to limit lymphedema progression.  Baseline: Dependent Goal status: 11/10/22 Goal Met  LONG TERM GOALS: Target date: 01/11/23  Given this patient's Intake score of 36.76 % on the Lymphedema Life Impact Scale (LLIS), patient will experience a reduction of at least 5 points in her perceived level of functional impairment resulting from lymphedema to improve functional performance and quality of life (QOL). Baseline: 36.76% Goal status: INITIAL  2.  Given this patient's Intake score of 56/100% on the functional outcomes FOTO tool, patient will experience an  increase in function of 3 points to improve basic and instrumental  ADLs performance, including lymphedema self-care.  Baseline: 56% Goal status: INITIAL  3.  During Intensive phase CDT Pt will achieve at least 85% compliance with all lymphedema self-care home program components, including  daily skin care, multilayer , gradient compression wraps with daily changes, daily simple self MLD and daily lymphatic pumping therex to achieve optimal clinical outcome and to habituate self care regime for optimal LE self-management over time. Baseline: Dependent Goal status: INITIAL  4.  Pt will achieve at least a 10% volume reductions bilaterally below the knees to return limb to more typical size and shape, to limit infection risk and LE progression, to decrease pain, to improve function, and to improve body image and QOL. Baseline: Dependent Goal status: INITIAL  5.  Pt will be able to don and doff appropriate compression garments and devices using assistive devices and extra time within 1 week of issue date for optimal lymphedema self-care. Baseline: Dependent Goal status: INITIAL  6.  During Intensive phase CDT Pt will achieve at least 85% compliance with all lymphedema self-care home program components, including  daily skin care, multilayer , gradient compression wraps with daily changes, daily simple self MLD and daily lymphatic pumping therex to achieve optimal clinical outcome and to habituate self care regime for optimal LE self-management over time. Baseline: ax A Goal status: INITIAL   PLAN:  PT FREQUENCY: 2x/week  PT DURATION: 12  weeks other: and PRN  PLANNED INTERVENTIONS: Therapeutic exercises, Therapeutic activity, Patient/Family education, Self Care, DME instructions, Manual lymph drainage, Compression bandaging, Manual therapy, and skin care during MLD, fit with appropriate custom compression garments and devices, fit with appropriate compression device  which  follows lymphatic  pathways and anatomic distribution  PLAN FOR NEXT SESSION:  BLE comparative limb volumetrics Initial knee length wrap to limb Pt deems most involved/ painful Pt/ family edu for LE self care   Loel Dubonnet, MS, OTR/L, CLT-LANA 12/07/22 3:58 PM

## 2022-12-09 ENCOUNTER — Ambulatory Visit: Payer: Self-pay

## 2022-12-09 VITALS — Wt 276.0 lb

## 2022-12-09 DIAGNOSIS — Z0000 Encounter for general adult medical examination without abnormal findings: Secondary | ICD-10-CM

## 2022-12-09 LAB — POCT URINALYSIS DIPSTICK
Bilirubin, UA: NEGATIVE
Blood, UA: NEGATIVE
Glucose, UA: NEGATIVE
Ketones, UA: NEGATIVE
Leukocytes, UA: NEGATIVE
Nitrite, UA: NEGATIVE
Protein, UA: NEGATIVE
Spec Grav, UA: 1.02 (ref 1.010–1.025)
Urobilinogen, UA: 0.2 E.U./dL
pH, UA: 6 (ref 5.0–8.0)

## 2022-12-09 NOTE — Progress Notes (Signed)
Pt completed labs for physical. ?

## 2022-12-10 LAB — CMP12+LP+TP+TSH+6AC+CBC/D/PLT
ALT: 15 IU/L (ref 0–32)
AST: 15 IU/L (ref 0–40)
Albumin/Globulin Ratio: 1.6 (ref 1.2–2.2)
Albumin: 4 g/dL (ref 3.9–4.9)
Alkaline Phosphatase: 127 IU/L — ABNORMAL HIGH (ref 44–121)
BUN/Creatinine Ratio: 21 (ref 12–28)
BUN: 14 mg/dL (ref 8–27)
Basophils Absolute: 0.1 10*3/uL (ref 0.0–0.2)
Basos: 1 %
Bilirubin Total: 0.3 mg/dL (ref 0.0–1.2)
Calcium: 9 mg/dL (ref 8.7–10.3)
Chloride: 104 mmol/L (ref 96–106)
Chol/HDL Ratio: 3.4 ratio (ref 0.0–4.4)
Cholesterol, Total: 253 mg/dL — ABNORMAL HIGH (ref 100–199)
Creatinine, Ser: 0.68 mg/dL (ref 0.57–1.00)
EOS (ABSOLUTE): 0.1 10*3/uL (ref 0.0–0.4)
Eos: 1 %
Estimated CHD Risk: 0.5 times avg. (ref 0.0–1.0)
Free Thyroxine Index: 3.1 (ref 1.2–4.9)
GGT: 14 IU/L (ref 0–60)
Globulin, Total: 2.5 g/dL (ref 1.5–4.5)
Glucose: 99 mg/dL (ref 70–99)
HDL: 74 mg/dL (ref >39)
Hematocrit: 42.6 % (ref 34.0–46.6)
Hemoglobin: 13.9 g/dL (ref 11.1–15.9)
Immature Grans (Abs): 0 10*3/uL (ref 0.0–0.1)
Immature Granulocytes: 0 %
Iron: 78 ug/dL (ref 27–139)
LDH: 172 IU/L (ref 119–226)
LDL Chol Calc (NIH): 164 mg/dL — ABNORMAL HIGH (ref 0–99)
Lymphocytes Absolute: 1.5 10*3/uL (ref 0.7–3.1)
Lymphs: 29 %
MCH: 27.9 pg (ref 26.6–33.0)
MCHC: 32.6 g/dL (ref 31.5–35.7)
MCV: 86 fL (ref 79–97)
Monocytes Absolute: 0.5 10*3/uL (ref 0.1–0.9)
Monocytes: 9 %
Neutrophils Absolute: 3.2 10*3/uL (ref 1.4–7.0)
Neutrophils: 60 %
Phosphorus: 2.9 mg/dL — ABNORMAL LOW (ref 3.0–4.3)
Platelets: 194 10*3/uL (ref 150–450)
Potassium: 4.3 mmol/L (ref 3.5–5.2)
RBC: 4.98 x10E6/uL (ref 3.77–5.28)
RDW: 14.9 % (ref 11.7–15.4)
Sodium: 139 mmol/L (ref 134–144)
T3 Uptake Ratio: 29 % (ref 24–39)
T4, Total: 10.7 ug/dL (ref 4.5–12.0)
TSH: 3.64 u[IU]/mL (ref 0.450–4.500)
Total Protein: 6.5 g/dL (ref 6.0–8.5)
Triglycerides: 90 mg/dL (ref 0–149)
Uric Acid: 4.7 mg/dL (ref 3.0–7.2)
VLDL Cholesterol Cal: 15 mg/dL (ref 5–40)
WBC: 5.3 10*3/uL (ref 3.4–10.8)
eGFR: 99 mL/min/{1.73_m2} (ref >59)

## 2022-12-10 LAB — HGB A1C W/O EAG: Hgb A1c MFr Bld: 6.2 % — ABNORMAL HIGH (ref 4.8–5.6)

## 2022-12-12 ENCOUNTER — Encounter: Payer: Self-pay | Admitting: Occupational Therapy

## 2022-12-12 ENCOUNTER — Ambulatory Visit: Payer: 59 | Attending: Plastic Surgery | Admitting: Occupational Therapy

## 2022-12-12 DIAGNOSIS — I890 Lymphedema, not elsewhere classified: Secondary | ICD-10-CM | POA: Diagnosis present

## 2022-12-12 DIAGNOSIS — R430 Anosmia: Secondary | ICD-10-CM | POA: Diagnosis present

## 2022-12-12 NOTE — Therapy (Signed)
OUTPATIENT OCCUPATIONAL THERAPY TREATMENT LOWER EXTREMITY LYMPHEDEMA  Patient Name: Mary [name] MRN: [id number] DOB:04-Mar-1961, 62 y.o., female Today's Date: 12/13/2022    END OF SESSION:  OT End of Session - 12/12/22 1512     Visit Number 12    Number of Visits 36    Date for OT Re-Evaluation 01/11/23    OT Start Time 0300    OT Stop Time 0410    OT Time Calculation (min) 70 min    Activity Tolerance Patient tolerated treatment well;No increased pain    Behavior During Therapy WFL for tasks assessed/performed               Past Medical History:  Diagnosis Date   Anemia    low iron at times   Arthritis    Colon polyps    Complication of anesthesia 1991   Pt reports she awoke during plantar fascia surgery and felt "everything"   Heart murmur    " small per patient"    Hyperlipidemia    Hypertension    Hypothyroidism    Lactose intolerance    PAC (premature atrial contraction)    Palpitations    Panic attacks    Pt reports restrictive clothing/areas and dry mouth cause her to have attack.   Peripheral vascular disease (HCC)    "small" clot after thermal ablasion   Pneumonia 2016   "walking pneumonia" 3 times in 2016   Shortness of breath    going upstairs (due to weight). not so much since weight loss   Sleep apnea    after weight loss does not have to use cpap   Vitamin B12 deficiency    Vitamin D deficiency    Past Surgical History:  Procedure Laterality Date   APPENDECTOMY  1985   CESAREAN SECTION  1987   twins   COLONOSCOPY     COLONOSCOPY WITH PROPOFOL N/A 02/27/2018   Procedure: COLONOSCOPY WITH PROPOFOL;  Surgeon: Toledo, Boykin Nearing, MD;  Location: [facility];  Service: Gastroenterology;  Laterality: N/A;   IRRIGATION AND DEBRIDEMENT HEMATOMA Right 11/12/2015   Procedure: IRRIGATION AND DEBRIDEMENT HEMATOMA;  Surgeon: Earline Mayotte, MD;  Location: [facility];  Service: General;  Laterality: Right;   KNEE ARTHROSCOPY WITH MEDIAL MENISECTOMY  Left 07/15/2020   Procedure: Left knee arthroscopy with partial medial or lateral menisectomy, possible chondroplasty, possible partial synovectomy;  Surgeon: Lyndle Herrlich, MD;  Location: [facility];  Service: Orthopedics;  Laterality: Left;   LAMINECTOMY  07/10/2013   L4   L5   DR Yetta Barre   LAPAROSCOPIC SALPINGO OOPHERECTOMY Right 04/17/2014   Procedure: LAPAROSCOPIC SALPINGO OOPHORECTOMY RIGHT;  Surgeon: Adolphus Birchwood, MD;  Location: [facility];  Service: Gynecology;  Laterality: Right;   LYSIS OF ADHESION  04/17/2014   Procedure: LYSIS OF ADHESION;  Surgeon: Adolphus Birchwood, MD;  Location: [facility];  Service: Gynecology;;   MAXIMUM ACCESS (MAS)POSTERIOR LUMBAR INTERBODY FUSION (PLIF) 1 LEVEL  07/10/2013   Procedure: FOR MAXIMUM ACCESS (MAS) POSTERIOR LUMBAR INTERBODY FUSION (PLIF) LUMBAR FOUR-FIVE;  Surgeon: Tia Alert, MD;  Location: [facility];  Service: Neurosurgery;;  FOR MAXIMUM ACCESS (MAS) POSTERIOR LUMBAR INTERBODY FUSION (PLIF) LUMBAR FOUR-FIVE   OOPHORECTOMY     LSO   OPEN REDUCTION INTERNAL FIXATION (ORIF) DISTAL RADIAL FRACTURE Left 09/20/2018   Procedure: OPEN REDUCTION INTERNAL FIXATION (ORIF) DISTAL RADIAL FRACTURE, LEFT;  Surgeon: Kennedy Bucker, MD;  Location: [facility];  Service: Orthopedics;  Laterality: Left;   PLANTAR FASCIA SURGERY  1999  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   VAGINAL HYSTERECTOMY  2001   for bleeding and LSO for cyst   Patient Active Problem List   Diagnosis Date Noted   Reactive hypoglycemia 02/16/2022   Prediabetes 02/16/2022   Chronic venous insufficiency 12/23/2021   Lymphedema 12/13/2021   Panic attacks    Strain of knee 12/24/2019   Vitamin B12 deficiency 10/08/2019   Arthritis 07/16/2019   Edema 07/16/2019   History of abdominal pain 07/16/2019   Low back pain 07/16/2019   Vitamin D deficiency 07/16/2019   Hypothyroidism 02/28/2019   History of gastric bypass 02/28/2019   Obstructive sleep apnea syndrome 02/28/2019   Insomnia 02/28/2019   Class 3  severe obesity due to excess calories with serious comorbidity and body mass index (BMI) of 40.0 to 44.9 in adult (HCC) 02/28/2019   Elevated BP without diagnosis of hypertension 02/28/2019   Mixed hyperlipidemia 02/28/2019   Hot flashes 02/28/2019   BMI 40.0-44.9, adult (HCC) 12/16/2015   Hematoma of lower extremity 11/12/2015   Essential hypertension 06/20/2014   Pure hypercholesterolemia 06/20/2014   S/P lumbar spinal fusion 07/10/2013   Arthrodesis status 07/10/2013   Palpitations 01/02/2012   Edema of both legs 01/02/2012    PCP: Mary Reining, PA-C  REFERRING PROVIDER: Foster Simpson, DO  REFERRING DIAG: I89.0  THERAPY DIAG:  Lymphedema, not elsewhere classified  Rationale for Evaluation and Treatment: Rehabilitation  ONSET DATE: Insidious onset with progression over time. Greater than 20 years  SUBJECTIVE:                                                                                                                                                                                           SUBJECTIVE STATEMENT:Mary [name] presents to OT visit to address BLE lipo-lymphedema. Pt is accompanied by her supportive spouse, Mary [name]. Pt denies LE-related pain in her legs.  Pt has no new complaints. She is in agreement with plan to complete custom compression garment / device specifications and measurements today.  PERTINENT HISTORY and contributing factors include, OA, HTN, Panic attacks 2/2 restrictive clothing, OSA (not using CPAP), s/p hysterectomy 1987, L knee sx, oophorectomy, s/p hysterectomy 2001, Gastric Bypass 2007, s/p plantar fascia sx 1999, obesity, insomnia  PAIN:  Are you having pain? No: NPRS scale: 0/10 Pain location: BLE- generalized, knees Pain description: discomfort, heavy, tired, full, sore Aggravating factors: standing, walking, extended dependent sitting Relieving factors: elevation  PRECAUTIONS: Other: LYMPHEDEMA PRECAUTIONS: HYPOTHYROID, DIABETES  SKIN PRECAUTIONS  WEIGHT BEARING RESTRICTIONS: No  FALLS:  Has patient fallen since last visit? 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  BLE COMPARATIVE LIMB VOLUMETRICS:  INTAKE: 11/01/22  LANDMARK RIGHT    R LEG (A-D) 5947.2 ml  R THIGH (E-G) 9750.0 ml  R FULL LIMB (A-G) 15697.2 ml  Limb Volume differential (LVD)  %  Volume change since initial %  Volume change overall V  (Blank rows = not tested)  LANDMARK LEFT   L LEG (A-D) 6011.6 ml  L THIGH (E-G) 9602.5 ml  L FULL LIMB (A-G) 15614.1 ml  Limb Volume differential (LVD)  LEG LVD = 1.8%, L>R % THIGH LVD = 1.5%, R>L FULL LIMB LVD = 0.53%, R>L  Volume change since initial %  Volume change overall %  (Blank rows = not tested)   9th VISIT 11/24/22  LANDMARK LEFT   L LEG (A-D) 5918.6 ml  L THIGH (E-G) 9265.5 ml  L FULL LIMB (A-G) 15184.1  ml  Limb Volume differential (LVD)  LEG volume = Decreased 1.5%;  THIGH volume decreased 3.5 %, AND  L FULL LIMB VOLUME dec BY 2.8 % SINCE 11/01/22  Volume change since initial %  Volume change overall %    TODAY'S TREATMENT:     Completed recommendations and specifications for daytime and hours of sleep compression Anatomical measurements for LLE knee length custom compression garments and HOS device. 2. LLE multilevel, knee length gradient compression  bandaging 3.  PATIENT EDUCATION: Reviewed progress towards goals to date. Continued Pt/ CG edu for lymphedema self care home program throughout session. Topics include outcome of comparative limb volumetrics- starting limb volume differentials (LVDs), technology and gradient techniques used for short stretch,  multilayer compression wrapping, simple self-MLD, therapeutic lymphatic pumping exercises, skin/nail care, LE precautions,. compression garment recommendations and specifications, wear and care schedule and compression garment donning / doffing w assistive devices. Discussed progress towards all OT goals since commencing CDT. All questions answered to the Pt's satisfaction. Good return.  Lymphedema management has a high burden of care, especially for Patients who have difficulty, or who are unable to reach their distal legs and feet.  reach their feet and distal legs to apply bandages, compression garments, to bathe , inspect skin and groom nails. In this case because Pt is unable to perform these activities, daily caregiver assistance with the lymphedema self-care home program  between OT visits is essential for achieving clinical success.  Person educated: Patient and Spouse Education method: Explanation, Demonstration, and Handouts Education comprehension: verbalized understanding, returned demonstration, and needs further education  HOME EXERCISE PROGRAM: Lymphatic Pumping Therex-  1 set of 10, 2 x daily, in order, seated  Daily skin inspection and care with low ph lotion matching skin ph to limit infection risk Simple Self-MLD 1 x daily During Intensive Phase CDT: multilayer compression wraps from foot to groin using gradient techniques, one limb at a time  to ensure safety During Self -Management Phase CDT: Fit with custom daytime compression garments: Elvarex, custom, flat knit, ccl 2 ( 23-32 mmHg) knee highs. Consider pairing with Compreshorts Capri length pantyhose for containment of buttocks, thighs and abdomen.  Fit with BLE knee length Jobst RELAX w/ open toe and zipper to limit ongoing tissue fibrosis formation and facilitate increased lymphatic function during HOS  Custom-made gradient compression garments and  HOS devices are medically necessary in this case because they are uniquely  sized and shaped to fit the exact dimensions of the affected extremities, and to provide accurate and consistent gradient compression essential for optimally managing this patient's symptoms of chronic, progressive lymphedema. Multiple custom compression garments are needed for optimal hygiene. Custom compression garments should be replaced q 3-6 months When worn consistently for optimal effectiveness.  6. Consider fitting with advanced Flexitouch PLUS sequential pneumatic compression device medically necessary to treat lipo-lymphedema extending above the inguinal lymph nodes, including the hips, abdomen, buttocks and abdomen. This device follows lymphatic anatomy and provides proximal to distal stimulation above the inguinal lymph nodes to ensure optimal lymphatic return via the thoracic duct.   ASSESSMENT:  CLINICAL IMPRESSION.  Completed specifications and anatomical measurements for LLE custom day and night time compression. Pt verbalized understanding of DME vendor's role in assisting with Pt with access to insurance benefits and procuring garments/devices from manufacture. Pt in agreement with specifications and recommendations. Fit ASAP. Wraps applied to L leg at end of session without increased pain. Cont as per POC.  OBJECTIVE IMPAIRMENTS: Abnormal gait, decreased activity tolerance, decreased balance, decreased knowledge of condition, decreased knowledge of use of DME, decreased mobility, difficulty walking, decreased ROM, increased edema, impaired sensation, obesity, pain, and chronic , progressive swelling and pain of buttocks, hips, abdomen and legs .   ACTIVITY LIMITATIONS: carrying, lifting, bending, sitting, standing, squatting, sleeping, stairs, transfers, bed mobility, bathing, dressing, hygiene/grooming, caring for others, and leisure pursuits, social participation, productive/ work activities  PARTICIPATION LIMITATIONS: meal prep, cleaning, laundry, interpersonal relationship,  driving, shopping, community activity, occupation, yard work, and church  PERSONAL FACTORS: Age, Past/current experiences, hx of panic attacks and claustrophobia, Time since onset of injury/illness/exacerbation,, motivation, supportive spouse and family are also affecting patient's functional outcome.   REHAB POTENTIAL: Fair Unfortunately the Lipedema does not respond to CDT and is unchanged by this protocol. Fortunately lymphedema  does respond to differing degrees based on the quality and amount of fatty fibrosis present obstructing lymphatics, and patient's tolerance if skin is hypersensitive.   EVALUATION COMPLEXITY: Moderate   GOALS: Goals reviewed with patient? Yes  SHORT TERM GOALS: Target date: 4th OT Rx visit   Pt will demonstrate understanding of lymphedema precautions and prevention strategies with modified independence using a printed reference to identify at least 5 precautions and discussing how s/he may implement them into daily life to reduce risk of progression with modified assistance ( printed reference). Baseline: Max A Goal status: 12/12/22 PROGRESSING  2.  Pt will be able to apply multilayer, thigh length, compression wraps using gradient techniques with Max caregiver assistance to decrease limb volume, to limit infection risk, and to limit lymphedema progression.  Baseline: Dependent Goal status: 11/10/22 Goal Met  LONG TERM GOALS: Target date: 01/11/23  Given this patient's Intake score of 36.76 % on the Lymphedema Life Impact Scale (LLIS), patient will experience a reduction of at least 5 points in her perceived level of functional impairment resulting from lymphedema to improve functional performance and quality of life (QOL). Baseline: 36.76% Goal status: 12/12/22 PROGRESSING  2.  Given this patient's Intake score of 56/100% on the functional outcomes FOTO tool, patient will experience an increase in function of 3 points to improve basic and instrumental ADLs  performance, including lymphedema self-care.  Baseline: 56% Goal status: 12/12/22 PROGRESSING  3.  During Intensive phase CDT Pt will achieve at least 85% compliance with all lymphedema self-care home program components, including  daily skin care, multilayer , gradient compression wraps with daily changes, daily simple self MLD and daily lymphatic pumping therex to achieve optimal clinical outcome and to habituate self care regime for optimal LE self-management over time. Baseline: Dependent Goal status: 12/12/22 PROGRESSING  4.  Pt will achieve at least a 10% volume reductions bilaterally below the knees to return limb to more typical size and shape, to limit infection risk and LE progression, to decrease pain, to improve function, and to improve body image and QOL. Baseline: Dependent Goal status: 12/12/22 PROGRESSING  5.  Pt will be able to don and doff appropriate compression garments and devices using assistive devices and extra time within 1 week of issue date for optimal lymphedema self-care. Baseline: Dependent Goal status: 12/12/22 PROGRESSING   PLAN:  PT FREQUENCY: 2x/week  PT DURATION: 12  weeks other: and PRN  PLANNED INTERVENTIONS: Therapeutic exercises, Therapeutic activity, Patient/Family education, Self Care, DME instructions, Manual lymph drainage, Compression bandaging, Manual therapy, and skin care during MLD, fit with appropriate custom compression garments and devices, fit with appropriate compression device  which  follows lymphatic pathways and anatomic distribution  PLAN FOR NEXT SESSION:  MLD Compression Skin care Pt/ family edu for LE self care   Loel Dubonnet, MS, OTR/L, CLT-LANA 12/13/22 9:14 AM

## 2022-12-13 ENCOUNTER — Encounter: Payer: Self-pay | Admitting: Occupational Therapy

## 2022-12-13 ENCOUNTER — Ambulatory Visit (INDEPENDENT_AMBULATORY_CARE_PROVIDER_SITE_OTHER): Payer: 59 | Admitting: Nurse Practitioner

## 2022-12-14 ENCOUNTER — Ambulatory Visit: Payer: 59 | Admitting: Occupational Therapy

## 2022-12-14 DIAGNOSIS — I890 Lymphedema, not elsewhere classified: Secondary | ICD-10-CM

## 2022-12-14 NOTE — Therapy (Addendum)
OUTPATIENT OCCUPATIONAL THERAPY TREATMENT LOWER EXTREMITY LYMPHEDEMA  Patient Name: ABIGAYL GONGORA MRN: [id number] DOB:April 18, 1961, 62 y.o., female Today's Date: 12/14/2022    END OF SESSION:  OT End of Session - 12/14/22 1502     Visit Number 13    Number of Visits 36    Date for OT Re-Evaluation 01/11/23    OT Start Time 0302    OT Stop Time 0403    OT Time Calculation (min) 61 min    Activity Tolerance Patient tolerated treatment well;No increased pain    Behavior During Therapy WFL for tasks assessed/performed               Past Medical History:  Diagnosis Date   Anemia    low iron at times   Arthritis    Colon polyps    Complication of anesthesia 1991   Pt reports she awoke during plantar fascia surgery and felt "everything"   Heart murmur    " small per patient"    Hyperlipidemia    Hypertension    Hypothyroidism    Lactose intolerance    PAC (premature atrial contraction)    Palpitations    Panic attacks    Pt reports restrictive clothing/areas and dry mouth cause her to have attack.   Peripheral vascular disease (HCC)    "small" clot after thermal ablasion   Pneumonia 2016   "walking pneumonia" 3 times in 2016   Shortness of breath    going upstairs (due to weight). not so much since weight loss   Sleep apnea    after weight loss does not have to use cpap   Vitamin B12 deficiency    Vitamin D deficiency    Past Surgical History:  Procedure Laterality Date   APPENDECTOMY  1985   CESAREAN SECTION  1987   twins   COLONOSCOPY     COLONOSCOPY WITH PROPOFOL N/A 02/27/2018   Procedure: COLONOSCOPY WITH PROPOFOL;  Surgeon: Toledo, Boykin Nearing, MD;  Location: [facility];  Service: Gastroenterology;  Laterality: N/A;   IRRIGATION AND DEBRIDEMENT HEMATOMA Right 11/12/2015   Procedure: IRRIGATION AND DEBRIDEMENT HEMATOMA;  Surgeon: Earline Mayotte, MD;  Location: [facility];  Service: General;  Laterality: Right;   KNEE ARTHROSCOPY WITH MEDIAL MENISECTOMY  Left 07/15/2020   Procedure: Left knee arthroscopy with partial medial or lateral menisectomy, possible chondroplasty, possible partial synovectomy;  Surgeon: Lyndle Herrlich, MD;  Location: [facility];  Service: Orthopedics;  Laterality: Left;   LAMINECTOMY  07/10/2013   L4   L5   DR Yetta Barre   LAPAROSCOPIC SALPINGO OOPHERECTOMY Right 04/17/2014   Procedure: LAPAROSCOPIC SALPINGO OOPHORECTOMY RIGHT;  Surgeon: Adolphus Birchwood, MD;  Location: [facility];  Service: Gynecology;  Laterality: Right;   LYSIS OF ADHESION  04/17/2014   Procedure: LYSIS OF ADHESION;  Surgeon: Adolphus Birchwood, MD;  Location: [facility];  Service: Gynecology;;   MAXIMUM ACCESS (MAS)POSTERIOR LUMBAR INTERBODY FUSION (PLIF) 1 LEVEL  07/10/2013   Procedure: FOR MAXIMUM ACCESS (MAS) POSTERIOR LUMBAR INTERBODY FUSION (PLIF) LUMBAR FOUR-FIVE;  Surgeon: Tia Alert, MD;  Location: [facility];  Service: Neurosurgery;;  FOR MAXIMUM ACCESS (MAS) POSTERIOR LUMBAR INTERBODY FUSION (PLIF) LUMBAR FOUR-FIVE   OOPHORECTOMY     LSO   OPEN REDUCTION INTERNAL FIXATION (ORIF) DISTAL RADIAL FRACTURE Left 09/20/2018   Procedure: OPEN REDUCTION INTERNAL FIXATION (ORIF) DISTAL RADIAL FRACTURE, LEFT;  Surgeon: Kennedy Bucker, MD;  Location: [facility];  Service: Orthopedics;  Laterality: Left;   PLANTAR FASCIA SURGERY  1999  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   VAGINAL HYSTERECTOMY  2001   for bleeding and LSO for cyst   Patient Active Problem List   Diagnosis Date Noted   Reactive hypoglycemia 02/16/2022   Prediabetes 02/16/2022   Chronic venous insufficiency 12/23/2021   Lymphedema 12/13/2021   Panic attacks    Strain of knee 12/24/2019   Vitamin B12 deficiency 10/08/2019   Arthritis 07/16/2019   Edema 07/16/2019   History of abdominal pain 07/16/2019   Low back pain 07/16/2019   Vitamin D deficiency 07/16/2019   Hypothyroidism 02/28/2019   History of gastric bypass 02/28/2019   Obstructive sleep apnea syndrome 02/28/2019   Insomnia 02/28/2019   Class 3  severe obesity due to excess calories with serious comorbidity and body mass index (BMI) of 40.0 to 44.9 in adult (HCC) 02/28/2019   Elevated BP without diagnosis of hypertension 02/28/2019   Mixed hyperlipidemia 02/28/2019   Hot flashes 02/28/2019   BMI 40.0-44.9, adult (HCC) 12/16/2015   Hematoma of lower extremity 11/12/2015   Essential hypertension 06/20/2014   Pure hypercholesterolemia 06/20/2014   S/P lumbar spinal fusion 07/10/2013   Arthrodesis status 07/10/2013   Palpitations 01/02/2012   Edema of both legs 01/02/2012    PCP: Joni Reining, PA-C  REFERRING PROVIDER: Foster Simpson, DO  REFERRING DIAG: I89.0  THERAPY DIAG:  Lymphedema, not elsewhere classified  Rationale for Evaluation and Treatment: Rehabilitation  ONSET DATE: Insidious onset with progression over time. Greater than 20 years  SUBJECTIVE:                                                                                                                                                                                           SUBJECTIVE STATEMENT:Cherina B Kanouse presents to OT visit to address BLE lipo-lymphedema. Pt is accompanied by her supportive spouse, [name]. Pt denies LE-related pain in her legs.  Pt has no new complaints. She is in agreement with plan to complete custom compression garment / device specifications and measurements today.  PERTINENT HISTORY and contributing factors include, OA, HTN, Panic attacks 2/2 restrictive clothing, OSA (not using CPAP), s/p hysterectomy 1987, L knee sx, oophorectomy, s/p hysterectomy 2001, Gastric Bypass 2007, s/p plantar fascia sx 1999, obesity, insomnia  PAIN:  Are you having pain? No: NPRS scale: 0/10 Pain location: BLE- generalized, knees Pain description: discomfort, heavy, tired, full, sore Aggravating factors: standing, walking, extended dependent sitting Relieving factors: elevation  PRECAUTIONS: Other: LYMPHEDEMA PRECAUTIONS: HYPOTHYROID, DIABETES  SKIN PRECAUTIONS  WEIGHT BEARING RESTRICTIONS: No  FALLS:  Has patient fallen since last visit? 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  BLE COMPARATIVE LIMB VOLUMETRICS:  INTAKE: 11/01/22  LANDMARK RIGHT    R LEG (A-D) 5947.2 ml  R THIGH (E-G) 9750.0 ml  R FULL LIMB (A-G) 15697.2 ml  Limb Volume differential (LVD)  %  Volume change since initial %  Volume change overall V  (Blank rows = not tested)  LANDMARK LEFT   L LEG (A-D) 6011.6 ml  L THIGH (E-G) 9602.5 ml  L FULL LIMB (A-G) 15614.1 ml  Limb Volume differential (LVD)  LEG LVD = 1.8%, L>R % THIGH LVD = 1.5%, R>L FULL LIMB LVD = 0.53%, R>L  Volume change since initial %  Volume change overall %  (Blank rows = not tested)   9th VISIT 11/24/22  LANDMARK LEFT   L LEG (A-D) 5918.6 ml  L THIGH (E-G) 9265.5 ml  L FULL LIMB (A-G) 15184.1  ml  Limb Volume differential (LVD)  LEG volume = Decreased 1.5%;  THIGH volume decreased 3.5 %, AND  L FULL LIMB VOLUME dec BY 2.8 % SINCE 11/01/22  Volume change since initial %  Volume change overall %    TODAY'S TREATMENT:     MLD to LLE as established 2. LLE multilevel, knee length gradient compression  bandaging 3.  PATIENT EDUCATION: Reviewed progress towards goals to date. Continued Pt/ CG edu for lymphedema self care home program throughout session. Topics include outcome of comparative limb volumetrics- starting limb volume differentials (LVDs), technology and gradient techniques used for short stretch, multilayer compression wrapping, simple self-MLD, therapeutic lymphatic pumping exercises, skin/nail care, LE precautions,. compression garment  recommendations and specifications, wear and care schedule and compression garment donning / doffing w assistive devices. Discussed progress towards all OT goals since commencing CDT. All questions answered to the Pt's satisfaction. Good return.  Lymphedema management has a high burden of care, especially for Patients who have difficulty, or who are unable to reach their distal legs and feet.  reach their feet and distal legs to apply bandages, compression garments, to bathe , inspect skin and groom nails. In this case because Pt is unable to perform these activities, daily caregiver assistance with the lymphedema self-care home program  between OT visits is essential for achieving clinical success.  Person educated: Patient and Spouse Education method: Explanation, Demonstration, and Handouts Education comprehension: verbalized understanding, returned demonstration, and needs further education  HOME EXERCISE PROGRAM: Lymphatic Pumping Therex-  1 set of 10, 2 x daily, in order, seated  Daily skin inspection and care with low ph lotion matching skin ph to limit infection risk Simple Self-MLD 1 x daily During Intensive Phase CDT: multilayer compression wraps from foot to groin using gradient techniques, one limb at a time  to ensure safety During Self -Management Phase CDT: Fit with custom daytime compression garments: Elvarex, custom, flat knit, ccl 2 ( 23-32 mmHg) knee highs. Consider pairing with Compreshorts Capri length pantyhose for containment of buttocks, thighs and abdomen.  Fit with BLE knee length Jobst RELAX w/ open toe and zipper to limit ongoing tissue fibrosis formation and facilitate increased lymphatic function during HOS  Custom-made gradient compression garments and HOS devices are medically necessary in this case because they are uniquely sized and shaped to fit the  exact dimensions of the affected extremities, and to provide accurate and consistent gradient compression essential for  optimally managing this patient's symptoms of chronic, progressive lymphedema. Multiple custom compression garments are needed for optimal hygiene. Custom compression garments should be replaced q 3-6 months When worn consistently for optimal effectiveness.  6. Consider fitting with advanced Flexitouch PLUS sequential pneumatic compression device medically necessary to treat lipo-lymphedema extending above the inguinal lymph nodes, including the hips, abdomen, buttocks and abdomen. This device follows lymphatic anatomy and provides proximal to distal stimulation above the inguinal lymph nodes to ensure optimal lymphatic return via the thoracic duct.   ASSESSMENT:  CLINICAL IMPRESSION.  Pt tolerated LLE/LLQ MLD without difficulty. No increased pain with knee length compression wraps t below the knee. Cont as per POC.Wraps applied to L leg at end of session without increased pain. Pt continues to demonstrate slow progress towards goals, which is not atypical for Lipo-lymphedema response.  Cont as per POC.  OBJECTIVE IMPAIRMENTS: Abnormal gait, decreased activity tolerance, decreased balance, decreased knowledge of condition, decreased knowledge of use of DME, decreased mobility, difficulty walking, decreased ROM, increased edema, impaired sensation, obesity, pain, and chronic , progressive swelling and pain of buttocks, hips, abdomen and legs .   ACTIVITY LIMITATIONS: carrying, lifting, bending, sitting, standing, squatting, sleeping, stairs, transfers, bed mobility, bathing, dressing, hygiene/grooming, caring for others, and leisure pursuits, social participation, productive/ work activities  PARTICIPATION LIMITATIONS: meal prep, cleaning, laundry, interpersonal relationship, driving, shopping, community activity, occupation, yard work, and church  PERSONAL FACTORS: Age, Past/current experiences, hx of panic attacks and claustrophobia, Time since onset of injury/illness/exacerbation,, motivation,  supportive spouse and family are also affecting patient's functional outcome.   REHAB POTENTIAL: Fair Unfortunately the Lipedema does not respond to CDT and is unchanged by this protocol. Fortunately lymphedema  does respond to differing degrees based on the quality and amount of fatty fibrosis present obstructing lymphatics, and patient's tolerance if skin is hypersensitive.   EVALUATION COMPLEXITY: Moderate   GOALS: Goals reviewed with patient? Yes  SHORT TERM GOALS: Target date: 4th OT Rx visit   Pt will demonstrate understanding of lymphedema precautions and prevention strategies with modified independence using a printed reference to identify at least 5 precautions and discussing how s/he may implement them into daily life to reduce risk of progression with modified assistance ( printed reference). Baseline: Max A Goal status: 12/12/22 PROGRESSING  2.  Pt will be able to apply multilayer, thigh length, compression wraps using gradient techniques with Max caregiver assistance to decrease limb volume, to limit infection risk, and to limit lymphedema progression.  Baseline: Dependent Goal status: 11/10/22 Goal Met  LONG TERM GOALS: Target date: 01/11/23  Given this patient's Intake score of 36.76 % on the Lymphedema Life Impact Scale (LLIS), patient will experience a reduction of at least 5 points in her perceived level of functional impairment resulting from lymphedema to improve functional performance and quality of life (QOL). Baseline: 36.76% Goal status: 12/12/22 PROGRESSING  2.  Given this patient's Intake score of 56/100% on the functional outcomes FOTO tool, patient will experience an increase in function of 3 points to improve basic and instrumental ADLs performance, including lymphedema self-care.  Baseline: 56% Goal status: 12/12/22 PROGRESSING  3.  During Intensive phase CDT Pt will achieve at least 85% compliance with all lymphedema self-care home program components, including   daily skin care, multilayer , gradient compression wraps with daily changes, daily simple self MLD and daily lymphatic pumping therex to achieve optimal clinical  outcome and to habituate self care regime for optimal LE self-management over time. Baseline: Dependent Goal status: 12/12/22 PROGRESSING  4.  Pt will achieve at least a 10% volume reductions bilaterally below the knees to return limb to more typical size and shape, to limit infection risk and LE progression, to decrease pain, to improve function, and to improve body image and QOL. Baseline: Dependent Goal status: 12/12/22 PROGRESSING  5.  Pt will be able to don and doff appropriate compression garments and devices using assistive devices and extra time within 1 week of issue date for optimal lymphedema self-care. Baseline: Dependent Goal status: 12/12/22 PROGRESSING   PLAN:  PT FREQUENCY: 2x/week  PT DURATION: 12  weeks other: and PRN  PLANNED INTERVENTIONS: Therapeutic exercises, Therapeutic activity, Patient/Family education, Self Care, DME instructions, Manual lymph drainage, Compression bandaging, Manual therapy, and skin care during MLD, fit with appropriate custom compression garments and devices, fit with appropriate compression device  which  follows lymphatic pathways and anatomic distribution  PLAN FOR NEXT SESSION:  MLD Compression Skin care Pt/ family edu for LE self care   Loel Dubonnet, MS, OTR/L, CLT-LANA 12/14/22 4:07 PM

## 2022-12-15 ENCOUNTER — Encounter: Payer: 59 | Admitting: Physician Assistant

## 2022-12-19 ENCOUNTER — Ambulatory Visit: Payer: 59 | Admitting: Occupational Therapy

## 2022-12-19 ENCOUNTER — Other Ambulatory Visit: Payer: Self-pay | Admitting: Unknown Physician Specialty

## 2022-12-19 DIAGNOSIS — R430 Anosmia: Secondary | ICD-10-CM

## 2022-12-19 DIAGNOSIS — I890 Lymphedema, not elsewhere classified: Secondary | ICD-10-CM

## 2022-12-19 NOTE — Therapy (Signed)
OUTPATIENT OCCUPATIONAL THERAPY TREATMENT LOWER EXTREMITY LYMPHEDEMA  Patient Name: Mary [name] MRN: [id number] DOB:Mar 19, 1961, 62 y.o., female Today's Date: 12/19/2022    END OF SESSION:  OT End of Session - 12/19/22 1511     Visit Number 14    Number of Visits 36    Date for OT Re-Evaluation 01/11/23    OT Start Time 0302    OT Stop Time 0400    OT Time Calculation (min) 58 min    Activity Tolerance Patient tolerated treatment well;No increased pain    Behavior During Therapy WFL for tasks assessed/performed               Past Medical History:  Diagnosis Date   Anemia    low iron at times   Arthritis    Colon polyps    Complication of anesthesia 1991   Pt reports she awoke during plantar fascia surgery and felt "everything"   Heart murmur    " small per patient"    Hyperlipidemia    Hypertension    Hypothyroidism    Lactose intolerance    PAC (premature atrial contraction)    Palpitations    Panic attacks    Pt reports restrictive clothing/areas and dry mouth cause her to have attack.   Peripheral vascular disease (HCC)    "small" clot after thermal ablasion   Pneumonia 2016   "walking pneumonia" 3 times in 2016   Shortness of breath    going upstairs (due to weight). not so much since weight loss   Sleep apnea    after weight loss does not have to use cpap   Vitamin B12 deficiency    Vitamin D deficiency    Past Surgical History:  Procedure Laterality Date   APPENDECTOMY  1985   CESAREAN SECTION  1987   twins   COLONOSCOPY     COLONOSCOPY WITH PROPOFOL N/A 02/27/2018   Procedure: COLONOSCOPY WITH PROPOFOL;  Surgeon: Toledo, Boykin Nearing, MD;  Location: [facility];  Service: Gastroenterology;  Laterality: N/A;   IRRIGATION AND DEBRIDEMENT HEMATOMA Right 11/12/2015   Procedure: IRRIGATION AND DEBRIDEMENT HEMATOMA;  Surgeon: Earline Mayotte, MD;  Location: [facility];  Service: General;  Laterality: Right;   KNEE ARTHROSCOPY WITH MEDIAL MENISECTOMY  Left 07/15/2020   Procedure: Left knee arthroscopy with partial medial or lateral menisectomy, possible chondroplasty, possible partial synovectomy;  Surgeon: Lyndle Herrlich, MD;  Location: [facility];  Service: Orthopedics;  Laterality: Left;   LAMINECTOMY  07/10/2013   L4   L5   DR Yetta Barre   LAPAROSCOPIC SALPINGO OOPHERECTOMY Right 04/17/2014   Procedure: LAPAROSCOPIC SALPINGO OOPHORECTOMY RIGHT;  Surgeon: Adolphus Birchwood, MD;  Location: [facility];  Service: Gynecology;  Laterality: Right;   LYSIS OF ADHESION  04/17/2014   Procedure: LYSIS OF ADHESION;  Surgeon: Adolphus Birchwood, MD;  Location: [facility];  Service: Gynecology;;   MAXIMUM ACCESS (MAS)POSTERIOR LUMBAR INTERBODY FUSION (PLIF) 1 LEVEL  07/10/2013   Procedure: FOR MAXIMUM ACCESS (MAS) POSTERIOR LUMBAR INTERBODY FUSION (PLIF) LUMBAR FOUR-FIVE;  Surgeon: Tia Alert, MD;  Location: [facility];  Service: Neurosurgery;;  FOR MAXIMUM ACCESS (MAS) POSTERIOR LUMBAR INTERBODY FUSION (PLIF) LUMBAR FOUR-FIVE   OOPHORECTOMY     LSO   OPEN REDUCTION INTERNAL FIXATION (ORIF) DISTAL RADIAL FRACTURE Left 09/20/2018   Procedure: OPEN REDUCTION INTERNAL FIXATION (ORIF) DISTAL RADIAL FRACTURE, LEFT;  Surgeon: Kennedy Bucker, MD;  Location: [facility];  Service: Orthopedics;  Laterality: Left;   PLANTAR FASCIA SURGERY  1999  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   VAGINAL HYSTERECTOMY  2001   for bleeding and LSO for cyst   Patient Active Problem List   Diagnosis Date Noted   Reactive hypoglycemia 02/16/2022   Prediabetes 02/16/2022   Chronic venous insufficiency 12/23/2021   Lymphedema 12/13/2021   Panic attacks    Strain of knee 12/24/2019   Vitamin B12 deficiency 10/08/2019   Arthritis 07/16/2019   Edema 07/16/2019   History of abdominal pain 07/16/2019   Low back pain 07/16/2019   Vitamin D deficiency 07/16/2019   Hypothyroidism 02/28/2019   History of gastric bypass 02/28/2019   Obstructive sleep apnea syndrome 02/28/2019   Insomnia 02/28/2019   Class 3  severe obesity due to excess calories with serious comorbidity and body mass index (BMI) of 40.0 to 44.9 in adult (HCC) 02/28/2019   Elevated BP without diagnosis of hypertension 02/28/2019   Mixed hyperlipidemia 02/28/2019   Hot flashes 02/28/2019   BMI 40.0-44.9, adult (HCC) 12/16/2015   Hematoma of lower extremity 11/12/2015   Essential hypertension 06/20/2014   Pure hypercholesterolemia 06/20/2014   S/P lumbar spinal fusion 07/10/2013   Arthrodesis status 07/10/2013   Palpitations 01/02/2012   Edema of both legs 01/02/2012    PCP: Joni Reining, PA-C  REFERRING PROVIDER: Foster Simpson, DO  REFERRING DIAG: I89.0  THERAPY DIAG:  Lymphedema, not elsewhere classified  Rationale for Evaluation and Treatment: Rehabilitation  ONSET DATE: Insidious onset with progression over time. Greater than 20 years  SUBJECTIVE:                                                                                                                                                                                           SUBJECTIVE STATEMENT:Mary [name] presents to OT visit to address BLE lipo-lymphedema. Pt is accompanied by her supportive spouse, [name]. Pt denies LE-related pain in her legs.  Pt has no new complaints.    PERTINENT HISTORY and contributing factors include, OA, HTN, Panic attacks 2/2 restrictive clothing, OSA (not using CPAP), s/p hysterectomy 1987, L knee sx, oophorectomy, s/p hysterectomy 2001, Gastric Bypass 2007, s/p plantar fascia sx 1999, obesity, insomnia  PAIN:  Are you having pain? No: NPRS scale: 0/10 Pain location: BLE- generalized, knees Pain description: discomfort, heavy, tired, full, sore Aggravating factors: standing, walking, extended dependent sitting Relieving factors: elevation  PRECAUTIONS: Other: LYMPHEDEMA PRECAUTIONS: HYPOTHYROID, DIABETES SKIN PRECAUTIONS  WEIGHT BEARING RESTRICTIONS: No  FALLS:  Has patient fallen since last visit?  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  BLE COMPARATIVE LIMB VOLUMETRICS:  INTAKE: 11/01/22  LANDMARK RIGHT    R LEG (A-D) 5947.2 ml  R THIGH (E-G) 9750.0 ml  R FULL LIMB (A-G) 15697.2 ml  Limb Volume differential (LVD)  %  Volume change since initial %  Volume change overall V  (Blank rows = not tested)  LANDMARK LEFT   L LEG (A-D) 6011.6 ml  L THIGH (E-G) 9602.5 ml  L FULL LIMB (A-G) 15614.1 ml  Limb Volume differential (LVD)  LEG LVD = 1.8%, L>R % THIGH LVD = 1.5%, R>L FULL LIMB LVD = 0.53%, R>L  Volume change since initial %  Volume change overall %  (Blank rows = not tested)   9th VISIT 11/24/22  LANDMARK LEFT   L LEG (A-D) 5918.6 ml  L THIGH (E-G) 9265.5 ml  L FULL LIMB (A-G) 15184.1  ml  Limb Volume differential (LVD)  LEG volume = Decreased 1.5%;  THIGH volume decreased 3.5 %, AND  L FULL LIMB VOLUME dec BY 2.8 % SINCE 11/01/22  Volume change since initial %  Volume change overall %    TODAY'S TREATMENT:     MLD to LLE as established 2. LLE multilevel, knee length gradient compression  bandaging 3.  PATIENT EDUCATION: Reviewed progress towards goals to date. Continued Pt/ CG edu for lymphedema self care home program throughout session. Topics include outcome of comparative limb volumetrics- starting limb volume differentials (LVDs), technology and gradient techniques used for short stretch, multilayer compression wrapping, simple self-MLD, therapeutic lymphatic pumping exercises, skin/nail care, LE precautions,. compression garment recommendations and specifications, wear and care schedule and compression garment donning / doffing w assistive  devices. Discussed progress towards all OT goals since commencing CDT. All questions answered to the Pt's satisfaction. Good return.  Lymphedema management has a high burden of care, especially for Patients who have difficulty, or who are unable to reach their distal legs and feet.  reach their feet and distal legs to apply bandages, compression garments, to bathe , inspect skin and groom nails. In this case because Pt is unable to perform these activities, daily caregiver assistance with the lymphedema self-care home program  between OT visits is essential for achieving clinical success.  Person educated: Patient and Spouse Education method: Explanation, Demonstration, and Handouts Education comprehension: verbalized understanding, returned demonstration, and needs further education  HOME EXERCISE PROGRAM: Lymphatic Pumping Therex-  1 set of 10, 2 x daily, in order, seated  Daily skin inspection and care with low ph lotion matching skin ph to limit infection risk Simple Self-MLD 1 x daily During Intensive Phase CDT: multilayer compression wraps from foot to groin using gradient techniques, one limb at a time  to ensure safety During Self -Management Phase CDT: Fit with custom daytime compression garments: Elvarex, custom, flat knit, ccl 2 ( 23-32 mmHg) knee highs. Consider pairing with Compreshorts Capri length pantyhose for containment of buttocks, thighs and abdomen.  Fit with BLE knee length Jobst RELAX w/ open toe and zipper to limit ongoing tissue fibrosis formation and facilitate increased lymphatic function during HOS  Custom-made gradient compression garments and HOS devices are medically necessary in this case because they are uniquely sized and shaped to fit the exact dimensions of the affected extremities, and to provide accurate and consistent gradient compression essential  for optimally managing this patient's symptoms of chronic, progressive lymphedema. Multiple custom compression  garments are needed for optimal hygiene. Custom compression garments should be replaced q 3-6 months When worn consistently for optimal effectiveness.  6. Consider fitting with advanced Flexitouch PLUS sequential pneumatic compression device medically necessary to treat lipo-lymphedema extending above the inguinal lymph nodes, including the hips, abdomen, buttocks and abdomen. This device follows lymphatic anatomy and provides proximal to distal stimulation above the inguinal lymph nodes to ensure optimal lymphatic return via the thoracic duct.   ASSESSMENT:  CLINICAL IMPRESSION.  Pt tolerated LLE/LLQ MLD without difficulty. No increased pain with knee length compression wraps t below the knee. Pt educated re OTS capri length compression shorts to provide firm 10-15 mmHg compression to hips, abdomen and tender lipedematous thighs. I believe these would give Pt comfort able support and reduce pain in the lower quadrants without the expense of custom lat knit garments. Measure for size next time.  Cont as per POC.  OBJECTIVE IMPAIRMENTS: Abnormal gait, decreased activity tolerance, decreased balance, decreased knowledge of condition, decreased knowledge of use of DME, decreased mobility, difficulty walking, decreased ROM, increased edema, impaired sensation, obesity, pain, and chronic , progressive swelling and pain of buttocks, hips, abdomen and legs .   ACTIVITY LIMITATIONS: carrying, lifting, bending, sitting, standing, squatting, sleeping, stairs, transfers, bed mobility, bathing, dressing, hygiene/grooming, caring for others, and leisure pursuits, social participation, productive/ work activities  PARTICIPATION LIMITATIONS: meal prep, cleaning, laundry, interpersonal relationship, driving, shopping, community activity, occupation, yard work, and church  PERSONAL FACTORS: Age, Past/current experiences, hx of panic attacks and claustrophobia, Time since onset of injury/illness/exacerbation,,  motivation, supportive spouse and family are also affecting patient's functional outcome.   REHAB POTENTIAL: Fair Unfortunately the Lipedema does not respond to CDT and is unchanged by this protocol. Fortunately lymphedema  does respond to differing degrees based on the quality and amount of fatty fibrosis present obstructing lymphatics, and patient's tolerance if skin is hypersensitive.   EVALUATION COMPLEXITY: Moderate   GOALS: Goals reviewed with patient? Yes  SHORT TERM GOALS: Target date: 4th OT Rx visit   Pt will demonstrate understanding of lymphedema precautions and prevention strategies with modified independence using a printed reference to identify at least 5 precautions and discussing how s/he may implement them into daily life to reduce risk of progression with modified assistance ( printed reference). Baseline: Max A Goal status: 12/12/22 PROGRESSING  2.  Pt will be able to apply multilayer, thigh length, compression wraps using gradient techniques with Max caregiver assistance to decrease limb volume, to limit infection risk, and to limit lymphedema progression.  Baseline: Dependent Goal status: 11/10/22 Goal Met  LONG TERM GOALS: Target date: 01/11/23  Given this patient's Intake score of 36.76 % on the Lymphedema Life Impact Scale (LLIS), patient will experience a reduction of at least 5 points in her perceived level of functional impairment resulting from lymphedema to improve functional performance and quality of life (QOL). Baseline: 36.76% Goal status: 12/12/22 PROGRESSING  2.  Given this patient's Intake score of 56/100% on the functional outcomes FOTO tool, patient will experience an increase in function of 3 points to improve basic and instrumental ADLs performance, including lymphedema self-care.  Baseline: 56% Goal status: 12/12/22 PROGRESSING  3.  During Intensive phase CDT Pt will achieve at least 85% compliance with all lymphedema self-care home program components,  including  daily skin care, multilayer , gradient compression wraps with daily changes, daily simple self MLD and daily lymphatic pumping  therex to achieve optimal clinical outcome and to habituate self care regime for optimal LE self-management over time. Baseline: Dependent Goal status: 12/12/22 PROGRESSING  4.  Pt will achieve at least a 10% volume reductions bilaterally below the knees to return limb to more typical size and shape, to limit infection risk and LE progression, to decrease pain, to improve function, and to improve body image and QOL. Baseline: Dependent Goal status: 12/12/22 PROGRESSING  5.  Pt will be able to don and doff appropriate compression garments and devices using assistive devices and extra time within 1 week of issue date for optimal lymphedema self-care. Baseline: Dependent Goal status: 12/12/22 PROGRESSING   PLAN:  PT FREQUENCY: 2x/week  PT DURATION: 12  weeks other: and PRN  PLANNED INTERVENTIONS: Therapeutic exercises, Therapeutic activity, Patient/Family education, Self Care, DME instructions, Manual lymph drainage, Compression bandaging, Manual therapy, and skin care during MLD, fit with appropriate custom compression garments and devices, fit with appropriate compression device  which  follows lymphatic pathways and anatomic distribution  PLAN FOR NEXT SESSION:  MLD Compression Skin care Pt/ family edu for LE self care   Loel Dubonnet, MS, OTR/L, CLT-LANA 12/19/22 4:02 PM

## 2022-12-20 ENCOUNTER — Ambulatory Visit (INDEPENDENT_AMBULATORY_CARE_PROVIDER_SITE_OTHER): Payer: 59 | Admitting: Nurse Practitioner

## 2022-12-20 ENCOUNTER — Other Ambulatory Visit: Payer: Self-pay

## 2022-12-20 ENCOUNTER — Ambulatory Visit: Payer: 59 | Admitting: Adult Health

## 2022-12-20 VITALS — BP 176/98 | HR 81 | Temp 98.1°F | Resp 16 | Wt 277.0 lb

## 2022-12-20 DIAGNOSIS — I890 Lymphedema, not elsewhere classified: Secondary | ICD-10-CM

## 2022-12-20 DIAGNOSIS — I10 Essential (primary) hypertension: Secondary | ICD-10-CM

## 2022-12-20 DIAGNOSIS — Z0000 Encounter for general adult medical examination without abnormal findings: Secondary | ICD-10-CM

## 2022-12-20 MED ORDER — EPINEPHRINE 0.3 MG/0.3ML IJ SOAJ: 0.3000 mg | INTRAMUSCULAR | 0 refills | Status: AC | PRN

## 2022-12-20 NOTE — Progress Notes (Signed)
[facility] [address], [place] [address]   Office Visit Note  Patient Name: Mary [name]  [id number]  [id number]  Date of Service: 12/20/2022  Chief Complaint  Patient presents with   Annual Exam     HPI Pt is here for annual exam. Overall she is doing well.  She is currently having issues with Lymphedema of her legs, and is seeing the lymphedema clinic.  At this time other than the lymphedema on the patient reports she is struggling with weight loss.  She is currently being treated with phentermine.  She is interested in using a GLP-1 medication however insurance will not cover it.    Current Medication:  Outpatient Encounter Medications as of 12/20/2022  Medication Sig Note   albuterol (VENTOLIN HFA) 108 (90 Base) MCG/ACT inhaler Inhale 2 puffs into the lungs every 6 (six) hours as needed for wheezing or shortness of breath.    Continuous Blood Gluc Receiver (DEXCOM G7 RECEIVER) DEVI Use to check glucose as directed    Continuous Blood Gluc Sensor (DEXCOM G7 SENSOR) MISC Change sensor every 10 days    cyanocobalamin (,VITAMIN B-12,) 1000 MCG/ML injection Inject 1 mL (1,000 mcg total) into the muscle every 14 (fourteen) days.    levothyroxine (SYNTHROID) 100 MCG tablet Take 1 tablet (100 mcg total) by mouth daily before breakfast.    phentermine 37.5 MG capsule Take 1 capsule (37.5 mg total) by mouth every morning.    simvastatin (ZOCOR) 40 MG tablet Take 1 tablet (40 mg total) by mouth at bedtime.    sodium chloride (OCEAN) 0.65 % SOLN nasal spray Place 2 sprays into both nostrils in the morning and at bedtime.    Vitamin D, Ergocalciferol, (DRISDOL) 1.25 MG (50000 UNIT) CAPS capsule Take 1 capsule (50,000 Units total) by mouth every 7 (seven) days.    zolpidem (AMBIEN CR) 12.5 MG CR tablet Take 1 tablet (12.5 mg total) by mouth at bedtime as needed for sleep.    fluticasone (FLONASE) 50 MCG/ACT nasal spray Place 1 spray into both nostrils in the morning and  at bedtime. (Patient not taking: Reported on 12/20/2022)    hydrochlorothiazide (HYDRODIURIL) 25 MG tablet Take 1 tablet (25 mg total) by mouth daily. (Patient not taking: Reported on 12/20/2022) 05/05/2022: Patient reports feeling bad when taking   loratadine (CLARITIN) 10 MG tablet Take 1 tablet (10 mg total) by mouth daily. (Patient not taking: Reported on 12/20/2022)    meloxicam (MOBIC) 15 MG tablet Take by mouth daily as needed for pain. (Patient not taking: Reported on 12/20/2022) 04/13/2022: Uses prn   [DISCONTINUED] benzonatate (TESSALON) 200 MG capsule Take 1 capsule (200 mg total) by mouth 3 (three) times daily as needed for cough. (Patient not taking: Reported on 11/24/2022)    [DISCONTINUED] chlorpheniramine-HYDROcodone (TUSSIONEX) 10-8 MG/5ML Take 5 mLs by mouth at bedtime as needed for cough. (Patient not taking: Reported on 11/24/2022)    [DISCONTINUED] EPINEPHrine 0.3 mg/0.3 mL IJ SOAJ injection Inject 0.3 mLs into the muscle as needed. (Patient not taking: Reported on 12/20/2022) 04/13/2022: Uses prn   [DISCONTINUED] promethazine-dextromethorphan (PROMETHAZINE-DM) 6.25-15 MG/5ML syrup Take 5 mLs by mouth 4 (four) times daily as needed for cough.    No facility-administered encounter medications on file as of 12/20/2022.      Medical History: Past Medical History:  Diagnosis Date   Anemia    low iron at times   Arthritis    Colon polyps    Complication of anesthesia 1991  Pt reports she awoke during plantar fascia surgery and felt "everything"   Heart murmur    " small per patient"    Hyperlipidemia    Hypertension    Hypothyroidism    Lactose intolerance    PAC (premature atrial contraction)    Palpitations    Panic attacks    Pt reports restrictive clothing/areas and dry mouth cause her to have attack.   Peripheral vascular disease (HCC)    "small" clot after thermal ablasion   Pneumonia 2016   "walking pneumonia" 3 times in 2016   Shortness of breath    going upstairs  (due to weight). not so much since weight loss   Sleep apnea    after weight loss does not have to use cpap   Vitamin B12 deficiency    Vitamin D deficiency      Vital Signs: BP (!) 176/98   Pulse 81   Temp 98.1 F (36.7 C)   Resp 16   Wt 277 lb (125.6 kg)   SpO2 99%   BMI 44.04 kg/m    Review of Systems  Constitutional:  Negative for chills, fatigue and fever.  Musculoskeletal:        Lymphdema to bilateral lower extermities    Physical Exam Vitals and nursing note reviewed.  Constitutional:      Appearance: Normal appearance.  HENT:     Head: Normocephalic.     Right Ear: Tympanic membrane and ear canal normal.     Left Ear: Tympanic membrane and ear canal normal.     Nose: Nose normal.     Mouth/Throat:     Mouth: Mucous membranes are moist.  Eyes:     Pupils: Pupils are equal, round, and reactive to light.  Pulmonary:     Effort: Pulmonary effort is normal.     Breath sounds: Normal breath sounds.  Abdominal:     General: Abdomen is flat.  Musculoskeletal:     Comments: BLE lyphmedema.   Lymphadenopathy:     Cervical: No cervical adenopathy.  Neurological:     Mental Status: She is alert.    Assessment/Plan: 1. Annual physical exam Up to date on PHM  2. Class 3 severe obesity due to excess calories with serious comorbidity and body mass index (BMI) of 40.0 to 44.9 in adult [facility]) Continue Phentermine, and discussed using semeglutide injections if she wants to pay cash from local pharmacy.  RX given to patient. Continue to follow up monthly for weight loss progress.   3. Essential hypertension Continue medications as directed.   4. Lymphedema Continue seeing lymphedema clinic as before.      General Counseling: Mary [name] verbalizes understanding of the findings of todays visit and agrees with plan of treatment. I have discussed any further diagnostic evaluation that may be needed or ordered today. We also reviewed her medications today. she has been  encouraged to call the office with any questions or concerns that should arise related to todays visit.   No orders of the defined types were placed in this encounter.   No orders of the defined types were placed in this encounter.   Time spent:20 Minutes    Johnna Acosta AGNP-C Nurse Practitioner

## 2022-12-20 NOTE — Progress Notes (Signed)
Here for yearly physical with [facility] provider.  Con't to take Phentermine and tolerate well.  Stated weight up slightly.  Has legs wrapped right now currently seeing lymphedema clinic with left leg wrapped.

## 2022-12-21 ENCOUNTER — Encounter: Payer: 59 | Admitting: Occupational Therapy

## 2022-12-22 ENCOUNTER — Ambulatory Visit: Payer: 59 | Admitting: Occupational Therapy

## 2022-12-22 ENCOUNTER — Encounter: Payer: Self-pay | Admitting: Occupational Therapy

## 2022-12-22 DIAGNOSIS — I890 Lymphedema, not elsewhere classified: Secondary | ICD-10-CM | POA: Diagnosis not present

## 2022-12-22 NOTE — Therapy (Signed)
OUTPATIENT OCCUPATIONAL THERAPY TREATMENT LOWER EXTREMITY LYMPHEDEMA  Patient Name: ASHBY LIFF MRN: [id number] DOB:12-01-1960, 62 y.o., female Today's Date: 12/22/2022    END OF SESSION:  OT End of Session - 12/22/22 1114     Visit Number 15    Number of Visits 36    Date for OT Re-Evaluation 01/11/23    OT Start Time 1108    OT Stop Time 1200    OT Time Calculation (min) 52 min    Activity Tolerance Patient tolerated treatment well;No increased pain    Behavior During Therapy WFL for tasks assessed/performed               Past Medical History:  Diagnosis Date   Anemia    low iron at times   Arthritis    Colon polyps    Complication of anesthesia 1991   Pt reports she awoke during plantar fascia surgery and felt "everything"   Heart murmur    " small per patient"    Hyperlipidemia    Hypertension    Hypothyroidism    Lactose intolerance    PAC (premature atrial contraction)    Palpitations    Panic attacks    Pt reports restrictive clothing/areas and dry mouth cause her to have attack.   Peripheral vascular disease (HCC)    "small" clot after thermal ablasion   Pneumonia 2016   "walking pneumonia" 3 times in 2016   Shortness of breath    going upstairs (due to weight). not so much since weight loss   Sleep apnea    after weight loss does not have to use cpap   Vitamin B12 deficiency    Vitamin D deficiency    Past Surgical History:  Procedure Laterality Date   APPENDECTOMY  1985   CESAREAN SECTION  1987   twins   COLONOSCOPY     COLONOSCOPY WITH PROPOFOL N/A 02/27/2018   Procedure: COLONOSCOPY WITH PROPOFOL;  Surgeon: Toledo, Boykin Nearing, MD;  Location: [facility];  Service: Gastroenterology;  Laterality: N/A;   IRRIGATION AND DEBRIDEMENT HEMATOMA Right 11/12/2015   Procedure: IRRIGATION AND DEBRIDEMENT HEMATOMA;  Surgeon: Earline Mayotte, MD;  Location: [facility];  Service: General;  Laterality: Right;   KNEE ARTHROSCOPY WITH MEDIAL MENISECTOMY  Left 07/15/2020   Procedure: Left knee arthroscopy with partial medial or lateral menisectomy, possible chondroplasty, possible partial synovectomy;  Surgeon: Lyndle Herrlich, MD;  Location: [facility];  Service: Orthopedics;  Laterality: Left;   LAMINECTOMY  07/10/2013   L4   L5   DR Yetta Barre   LAPAROSCOPIC SALPINGO OOPHERECTOMY Right 04/17/2014   Procedure: LAPAROSCOPIC SALPINGO OOPHORECTOMY RIGHT;  Surgeon: Adolphus Birchwood, MD;  Location: [facility];  Service: Gynecology;  Laterality: Right;   LYSIS OF ADHESION  04/17/2014   Procedure: LYSIS OF ADHESION;  Surgeon: Adolphus Birchwood, MD;  Location: [facility];  Service: Gynecology;;   MAXIMUM ACCESS (MAS)POSTERIOR LUMBAR INTERBODY FUSION (PLIF) 1 LEVEL  07/10/2013   Procedure: FOR MAXIMUM ACCESS (MAS) POSTERIOR LUMBAR INTERBODY FUSION (PLIF) LUMBAR FOUR-FIVE;  Surgeon: Tia Alert, MD;  Location: [facility];  Service: Neurosurgery;;  FOR MAXIMUM ACCESS (MAS) POSTERIOR LUMBAR INTERBODY FUSION (PLIF) LUMBAR FOUR-FIVE   OOPHORECTOMY     LSO   OPEN REDUCTION INTERNAL FIXATION (ORIF) DISTAL RADIAL FRACTURE Left 09/20/2018   Procedure: OPEN REDUCTION INTERNAL FIXATION (ORIF) DISTAL RADIAL FRACTURE, LEFT;  Surgeon: Kennedy Bucker, MD;  Location: [facility];  Service: Orthopedics;  Laterality: Left;   PLANTAR FASCIA SURGERY  1999  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   VAGINAL HYSTERECTOMY  2001   for bleeding and LSO for cyst   Patient Active Problem List   Diagnosis Date Noted   Reactive hypoglycemia 02/16/2022   Prediabetes 02/16/2022   Chronic venous insufficiency 12/23/2021   Lymphedema 12/13/2021   Panic attacks    Strain of knee 12/24/2019   Vitamin B12 deficiency 10/08/2019   Arthritis 07/16/2019   Edema 07/16/2019   History of abdominal pain 07/16/2019   Low back pain 07/16/2019   Vitamin D deficiency 07/16/2019   Hypothyroidism 02/28/2019   History of gastric bypass 02/28/2019   Obstructive sleep apnea syndrome 02/28/2019   Insomnia 02/28/2019   Class 3  severe obesity due to excess calories with serious comorbidity and body mass index (BMI) of 40.0 to 44.9 in adult (HCC) 02/28/2019   Elevated BP without diagnosis of hypertension 02/28/2019   Mixed hyperlipidemia 02/28/2019   Hot flashes 02/28/2019   BMI 40.0-44.9, adult (HCC) 12/16/2015   Hematoma of lower extremity 11/12/2015   Essential hypertension 06/20/2014   Pure hypercholesterolemia 06/20/2014   S/P lumbar spinal fusion 07/10/2013   Arthrodesis status 07/10/2013   Palpitations 01/02/2012   Edema of both legs 01/02/2012    PCP: Joni Reining, PA-C  REFERRING PROVIDER: Foster Simpson, DO  REFERRING DIAG: I89.0  THERAPY DIAG:  Lymphedema, not elsewhere classified  Rationale for Evaluation and Treatment: Rehabilitation  ONSET DATE: Insidious onset with progression over time. Greater than 20 years  SUBJECTIVE:                                                                                                                                                                                           SUBJECTIVE STATEMENT:Lakitha B Stangl presents to OT visit to address BLE lipo-lymphedema. Pt is accompanied by her supportive spouse, [name]. Pt denies LE-related pain in her legs.  Pt has no new complaints.    PERTINENT HISTORY and contributing factors include, OA, HTN, Panic attacks 2/2 restrictive clothing, OSA (not using CPAP), s/p hysterectomy 1987, L knee sx, oophorectomy, s/p hysterectomy 2001, Gastric Bypass 2007, s/p plantar fascia sx 1999, obesity, insomnia  PAIN:  Are you having pain? No: NPRS scale: 0/10 Pain location: BLE- generalized, knees Pain description: discomfort, heavy, tired, full, sore Aggravating factors: standing, walking, extended dependent sitting Relieving factors: elevation  PRECAUTIONS: Other: LYMPHEDEMA PRECAUTIONS: HYPOTHYROID, DIABETES SKIN PRECAUTIONS  WEIGHT BEARING RESTRICTIONS: No  FALLS:  Has patient fallen since last visit?  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  BLE COMPARATIVE LIMB VOLUMETRICS:  INTAKE: 11/01/22  LANDMARK RIGHT    R LEG (A-D) 5947.2 ml  R THIGH (E-G) 9750.0 ml  R FULL LIMB (A-G) 15697.2 ml  Limb Volume differential (LVD)  %  Volume change since initial %  Volume change overall V  (Blank rows = not tested)  LANDMARK LEFT   L LEG (A-D) 6011.6 ml  L THIGH (E-G) 9602.5 ml  L FULL LIMB (A-G) 15614.1 ml  Limb Volume differential (LVD)  LEG LVD = 1.8%, L>R % THIGH LVD = 1.5%, R>L FULL LIMB LVD = 0.53%, R>L  Volume change since initial %  Volume change overall %  (Blank rows = not tested)   9th VISIT 11/24/22  LANDMARK LEFT   L LEG (A-D) 5918.6 ml  L THIGH (E-G) 9265.5 ml  L FULL LIMB (A-G) 15184.1  ml  Limb Volume differential (LVD)  LEG volume = Decreased 1.5%;  THIGH volume decreased 3.5 %, AND  L FULL LIMB VOLUME dec BY 2.8 % SINCE 11/01/22  Volume change since initial %  Volume change overall %    TODAY'S TREATMENT:     MLD to LLE as established 2. LLE multilevel, knee length gradient compression  bandaging 3.  PATIENT EDUCATION: Reviewed progress towards goals to date. Continued Pt/ CG edu for lymphedema self care home program throughout session. Topics include outcome of comparative limb volumetrics- starting limb volume differentials (LVDs), technology and gradient techniques used for short stretch, multilayer compression wrapping, simple self-MLD, therapeutic lymphatic pumping exercises, skin/nail care, LE precautions,. compression garment recommendations and specifications, wear and care schedule and compression garment donning / doffing w assistive  devices. Discussed progress towards all OT goals since commencing CDT. All questions answered to the Pt's satisfaction. Good return.  Lymphedema management has a high burden of care, especially for Patients who have difficulty, or who are unable to reach their distal legs and feet.  reach their feet and distal legs to apply bandages, compression garments, to bathe , inspect skin and groom nails. In this case because Pt is unable to perform these activities, daily caregiver assistance with the lymphedema self-care home program  between OT visits is essential for achieving clinical success.  Person educated: Patient and Spouse Education method: Explanation, Demonstration, and Handouts Education comprehension: verbalized understanding, returned demonstration, and needs further education  HOME EXERCISE PROGRAM: Lymphatic Pumping Therex-  1 set of 10, 2 x daily, in order, seated  Daily skin inspection and care with low ph lotion matching skin ph to limit infection risk Simple Self-MLD 1 x daily During Intensive Phase CDT: multilayer compression wraps from foot to groin using gradient techniques, one limb at a time  to ensure safety During Self -Management Phase CDT: Fit with custom daytime compression garments: Elvarex, custom, flat knit, ccl 2 ( 23-32 mmHg) knee highs. Consider pairing with Compreshorts Capri length pantyhose for containment of buttocks, thighs and abdomen.  Fit with BLE knee length Jobst RELAX w/ open toe and zipper to limit ongoing tissue fibrosis formation and facilitate increased lymphatic function during HOS  Custom-made gradient compression garments and HOS devices are medically necessary in this case because they are uniquely sized and shaped to fit the exact dimensions of the affected extremities, and to provide accurate and consistent gradient compression essential  for optimally managing this patient's symptoms of chronic, progressive lymphedema. Multiple custom compression  garments are needed for optimal hygiene. Custom compression garments should be replaced q 3-6 months When worn consistently for optimal effectiveness.  6. Consider fitting with advanced Flexitouch PLUS sequential pneumatic compression device medically necessary to treat lipo-lymphedema extending above the inguinal lymph nodes, including the hips, abdomen, buttocks and abdomen. This device follows lymphatic anatomy and provides proximal to distal stimulation above the inguinal lymph nodes to ensure optimal lymphatic return via the thoracic duct.   ASSESSMENT: Pt continues to tolerate all aspects of CDT. She is doing a nice job w LE self-care between sessions and appears to be further reduced below the knee volumetrically, albeit by a small amount. Still, as any volumetric reduction is appreciated in the context of lipo-lipedema, Pt and spouse are pleased the reduction achieved and sustained thus far in Rx.  Reapplied compression wraps after MLD. Cont as per POC.  OBJECTIVE IMPAIRMENTS: Abnormal gait, decreased activity tolerance, decreased balance, decreased knowledge of condition, decreased knowledge of use of DME, decreased mobility, difficulty walking, decreased ROM, increased edema, impaired sensation, obesity, pain, and chronic , progressive swelling and pain of buttocks, hips, abdomen and legs .   ACTIVITY LIMITATIONS: carrying, lifting, bending, sitting, standing, squatting, sleeping, stairs, transfers, bed mobility, bathing, dressing, hygiene/grooming, caring for others, and leisure pursuits, social participation, productive/ work activities  PARTICIPATION LIMITATIONS: meal prep, cleaning, laundry, interpersonal relationship, driving, shopping, community activity, occupation, yard work, and church  PERSONAL FACTORS: Age, Past/current experiences, hx of panic attacks and claustrophobia, Time since onset of injury/illness/exacerbation,, motivation, supportive spouse and family are also affecting  patient's functional outcome.   REHAB POTENTIAL: Fair Unfortunately the Lipedema does not respond to CDT and is unchanged by this protocol. Fortunately lymphedema  does respond to differing degrees based on the quality and amount of fatty fibrosis present obstructing lymphatics, and patient's tolerance if skin is hypersensitive.   EVALUATION COMPLEXITY: Moderate   GOALS: Goals reviewed with patient? Yes  SHORT TERM GOALS: Target date: 4th OT Rx visit   Pt will demonstrate understanding of lymphedema precautions and prevention strategies with modified independence using a printed reference to identify at least 5 precautions and discussing how s/he may implement them into daily life to reduce risk of progression with modified assistance ( printed reference). Baseline: Max A Goal status: 12/12/22 PROGRESSING  2.  Pt will be able to apply multilayer, thigh length, compression wraps using gradient techniques with Max caregiver assistance to decrease limb volume, to limit infection risk, and to limit lymphedema progression.  Baseline: Dependent Goal status: 11/10/22 Goal Met  LONG TERM GOALS: Target date: 01/11/23  Given this patient's Intake score of 36.76 % on the Lymphedema Life Impact Scale (LLIS), patient will experience a reduction of at least 5 points in her perceived level of functional impairment resulting from lymphedema to improve functional performance and quality of life (QOL). Baseline: 36.76% Goal status: 12/12/22 PROGRESSING  2.  Given this patient's Intake score of 56/100% on the functional outcomes FOTO tool, patient will experience an increase in function of 3 points to improve basic and instrumental ADLs performance, including lymphedema self-care.  Baseline: 56% Goal status: 12/12/22 PROGRESSING  3.  During Intensive phase CDT Pt will achieve at least 85% compliance with all lymphedema self-care home program components, including  daily skin care, multilayer , gradient compression  wraps with daily changes, daily simple self MLD and daily lymphatic pumping therex to achieve optimal clinical outcome and  to habituate self care regime for optimal LE self-management over time. Baseline: Dependent Goal status: 12/12/22 PROGRESSING  4.  Pt will achieve at least a 10% volume reductions bilaterally below the knees to return limb to more typical size and shape, to limit infection risk and LE progression, to decrease pain, to improve function, and to improve body image and QOL. Baseline: Dependent Goal status: 12/12/22 PROGRESSING  5.  Pt will be able to don and doff appropriate compression garments and devices using assistive devices and extra time within 1 week of issue date for optimal lymphedema self-care. Baseline: Dependent Goal status: 12/12/22 PROGRESSING   PLAN:  PT FREQUENCY: 2x/week  PT DURATION: 12  weeks other: and PRN  PLANNED INTERVENTIONS: Therapeutic exercises, Therapeutic activity, Patient/Family education, Self Care, DME instructions, Manual lymph drainage, Compression bandaging, Manual therapy, and skin care during MLD, fit with appropriate custom compression garments and devices, fit with appropriate compression device  which  follows lymphatic pathways and anatomic distribution  PLAN FOR NEXT SESSION:  MLD Compression Skin care Pt/ family edu for LE self care   Loel Dubonnet, MS, OTR/L, CLT-LANA 12/22/22 12:33 PM

## 2022-12-26 ENCOUNTER — Ambulatory Visit: Payer: 59 | Admitting: Occupational Therapy

## 2022-12-26 DIAGNOSIS — I890 Lymphedema, not elsewhere classified: Secondary | ICD-10-CM | POA: Diagnosis not present

## 2022-12-26 NOTE — Therapy (Signed)
OUTPATIENT OCCUPATIONAL THERAPY TREATMENT LOWER EXTREMITY LYMPHEDEMA  Patient Name: Mary [name] MRN: [id number] DOB:July 30, 1960, 62 y.o., female Today's Date: 12/26/2022    END OF SESSION:  OT End of Session - 12/26/22 1517     Visit Number 15    Number of Visits 36    Date for OT Re-Evaluation 01/11/23    OT Start Time 0300    OT Stop Time 0400    OT Time Calculation (min) 60 min    Activity Tolerance Patient tolerated treatment well;No increased pain    Behavior During Therapy WFL for tasks assessed/performed               Past Medical History:  Diagnosis Date   Anemia    low iron at times   Arthritis    Colon polyps    Complication of anesthesia 1991   Pt reports she awoke during plantar fascia surgery and felt "everything"   Heart murmur    " small per patient"    Hyperlipidemia    Hypertension    Hypothyroidism    Lactose intolerance    PAC (premature atrial contraction)    Palpitations    Panic attacks    Pt reports restrictive clothing/areas and dry mouth cause her to have attack.   Peripheral vascular disease (HCC)    "small" clot after thermal ablasion   Pneumonia 2016   "walking pneumonia" 3 times in 2016   Shortness of breath    going upstairs (due to weight). not so much since weight loss   Sleep apnea    after weight loss does not have to use cpap   Vitamin B12 deficiency    Vitamin D deficiency    Past Surgical History:  Procedure Laterality Date   APPENDECTOMY  1985   CESAREAN SECTION  1987   twins   COLONOSCOPY     COLONOSCOPY WITH PROPOFOL N/A 02/27/2018   Procedure: COLONOSCOPY WITH PROPOFOL;  Surgeon: Toledo, Boykin Nearing, MD;  Location: [facility];  Service: Gastroenterology;  Laterality: N/A;   IRRIGATION AND DEBRIDEMENT HEMATOMA Right 11/12/2015   Procedure: IRRIGATION AND DEBRIDEMENT HEMATOMA;  Surgeon: Earline Mayotte, MD;  Location: [facility];  Service: General;  Laterality: Right;   KNEE ARTHROSCOPY WITH MEDIAL MENISECTOMY  Left 07/15/2020   Procedure: Left knee arthroscopy with partial medial or lateral menisectomy, possible chondroplasty, possible partial synovectomy;  Surgeon: Lyndle Herrlich, MD;  Location: [facility];  Service: Orthopedics;  Laterality: Left;   LAMINECTOMY  07/10/2013   L4   L5   DR Yetta Barre   LAPAROSCOPIC SALPINGO OOPHERECTOMY Right 04/17/2014   Procedure: LAPAROSCOPIC SALPINGO OOPHORECTOMY RIGHT;  Surgeon: Adolphus Birchwood, MD;  Location: [facility];  Service: Gynecology;  Laterality: Right;   LYSIS OF ADHESION  04/17/2014   Procedure: LYSIS OF ADHESION;  Surgeon: Adolphus Birchwood, MD;  Location: [facility];  Service: Gynecology;;   MAXIMUM ACCESS (MAS)POSTERIOR LUMBAR INTERBODY FUSION (PLIF) 1 LEVEL  07/10/2013   Procedure: FOR MAXIMUM ACCESS (MAS) POSTERIOR LUMBAR INTERBODY FUSION (PLIF) LUMBAR FOUR-FIVE;  Surgeon: Tia Alert, MD;  Location: [facility];  Service: Neurosurgery;;  FOR MAXIMUM ACCESS (MAS) POSTERIOR LUMBAR INTERBODY FUSION (PLIF) LUMBAR FOUR-FIVE   OOPHORECTOMY     LSO   OPEN REDUCTION INTERNAL FIXATION (ORIF) DISTAL RADIAL FRACTURE Left 09/20/2018   Procedure: OPEN REDUCTION INTERNAL FIXATION (ORIF) DISTAL RADIAL FRACTURE, LEFT;  Surgeon: Kennedy Bucker, MD;  Location: [facility];  Service: Orthopedics;  Laterality: Left;   PLANTAR FASCIA SURGERY  1999  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   VAGINAL HYSTERECTOMY  2001   for bleeding and LSO for cyst   Patient Active Problem List   Diagnosis Date Noted   Reactive hypoglycemia 02/16/2022   Prediabetes 02/16/2022   Chronic venous insufficiency 12/23/2021   Lymphedema 12/13/2021   Panic attacks    Strain of knee 12/24/2019   Vitamin B12 deficiency 10/08/2019   Arthritis 07/16/2019   Edema 07/16/2019   History of abdominal pain 07/16/2019   Low back pain 07/16/2019   Vitamin D deficiency 07/16/2019   Hypothyroidism 02/28/2019   History of gastric bypass 02/28/2019   Obstructive sleep apnea syndrome 02/28/2019   Insomnia 02/28/2019   Class 3  severe obesity due to excess calories with serious comorbidity and body mass index (BMI) of 40.0 to 44.9 in adult (HCC) 02/28/2019   Elevated BP without diagnosis of hypertension 02/28/2019   Mixed hyperlipidemia 02/28/2019   Hot flashes 02/28/2019   BMI 40.0-44.9, adult (HCC) 12/16/2015   Hematoma of lower extremity 11/12/2015   Essential hypertension 06/20/2014   Pure hypercholesterolemia 06/20/2014   S/P lumbar spinal fusion 07/10/2013   Arthrodesis status 07/10/2013   Palpitations 01/02/2012   Edema of both legs 01/02/2012    PCP: Joni Reining, PA-C  REFERRING PROVIDER: Foster Simpson, DO  REFERRING DIAG: I89.0  THERAPY DIAG:  Lymphedema, not elsewhere classified  Rationale for Evaluation and Treatment: Rehabilitation  ONSET DATE: Insidious onset with progression over time. Greater than 20 years  SUBJECTIVE:                                                                                                                                                                                           SUBJECTIVE STATEMENT:Mary [name] presents to OT visit to address BLE lipo-lymphedema. Pt is accompanied by her supportive spouse, [name]. Pt denies LE-related pain in her legs.  Pt has no new complaints. Pt expresses concern that compression garment vendor places order in time to utilize benefits before the end of June when her benefit year starts over.OT left email for vendor requesting status update and sending reminder to update Pt.   PERTINENT HISTORY and contributing factors include, OA, HTN, Panic attacks 2/2 restrictive clothing, OSA (not using CPAP), s/p hysterectomy 1987, L knee sx, oophorectomy, s/p hysterectomy 2001, Gastric Bypass 2007, s/p plantar fascia sx 1999, obesity, insomnia  PAIN:  Are you having pain? No: NPRS scale: 0/10 Pain location: BLE- generalized, knees Pain description: discomfort, heavy, tired, full, sore Aggravating factors: standing, walking,  extended dependent sitting Relieving factors: elevation  PRECAUTIONS: Other: LYMPHEDEMA PRECAUTIONS: HYPOTHYROID, DIABETES SKIN PRECAUTIONS  WEIGHT BEARING RESTRICTIONS: No  FALLS:  Has patient fallen since last visit? 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  BLE COMPARATIVE LIMB VOLUMETRICS:  INTAKE: 11/01/22  LANDMARK RIGHT    R LEG (A-D) 5947.2 ml  R THIGH (E-G) 9750.0 ml  R FULL LIMB (A-G) 15697.2 ml  Limb Volume differential (LVD)  %  Volume change since initial %  Volume change overall V  (Blank rows = not tested)  LANDMARK LEFT   L LEG (A-D) 6011.6 ml  L THIGH (E-G) 9602.5 ml  L FULL LIMB (A-G) 15614.1 ml  Limb Volume differential (LVD)  LEG LVD = 1.8%, L>R % THIGH LVD = 1.5%, R>L FULL LIMB LVD = 0.53%, R>L  Volume change since initial %  Volume change overall %  (Blank rows = not tested)   9th VISIT 11/24/22  LANDMARK LEFT   L LEG (A-D) 5918.6 ml  L THIGH (E-G) 9265.5 ml  L FULL LIMB (A-G) 15184.1  ml  Limb Volume differential (LVD)  LEG volume = Decreased 1.5%;  THIGH volume decreased 3.5 %, AND  L FULL LIMB VOLUME dec BY 2.8 % SINCE 11/01/22  Volume change since initial %  Volume change overall %    TODAY'S TREATMENT:     MLD to LLE as established 2. LLE multilevel, knee length gradient compression  bandaging 3.  PATIENT EDUCATION: Reviewed progress towards goals to date. Continued Pt/ CG edu for lymphedema self care home program throughout session. Topics include outcome of comparative limb volumetrics- starting limb volume differentials (LVDs), technology and gradient techniques used for short stretch, multilayer compression  wrapping, simple self-MLD, therapeutic lymphatic pumping exercises, skin/nail care, LE precautions,. compression garment recommendations and specifications, wear and care schedule and compression garment donning / doffing w assistive devices. Discussed progress towards all OT goals since commencing CDT. All questions answered to the Pt's satisfaction. Good return.  Lymphedema management has a high burden of care, especially for Patients who have difficulty, or who are unable to reach their distal legs and feet.  reach their feet and distal legs to apply bandages, compression garments, to bathe , inspect skin and groom nails. In this case because Pt is unable to perform these activities, daily caregiver assistance with the lymphedema self-care home program  between OT visits is essential for achieving clinical success.  Person educated: Patient and Spouse Education method: Explanation, Demonstration, and Handouts Education comprehension: verbalized understanding, returned demonstration, and needs further education  HOME EXERCISE PROGRAM: Lymphatic Pumping Therex-  1 set of 10, 2 x daily, in order, seated  Daily skin inspection and care with low ph lotion matching skin ph to limit infection risk Simple Self-MLD 1 x daily During Intensive Phase CDT: multilayer compression wraps from foot to groin using gradient techniques, one limb at a time  to ensure safety During Self -Management Phase CDT: Fit with custom daytime compression garments: Elvarex, custom, flat knit, ccl 2 ( 23-32 mmHg) knee highs. Consider pairing with Compreshorts Capri length pantyhose for containment of buttocks, thighs and abdomen.  Fit with BLE knee length Jobst RELAX w/ open toe and zipper to limit ongoing tissue fibrosis formation and facilitate increased lymphatic function during HOS  Custom-made gradient  compression garments and HOS devices are medically necessary in this case because they are uniquely sized and shaped to fit  the exact dimensions of the affected extremities, and to provide accurate and consistent gradient compression essential for optimally managing this patient's symptoms of chronic, progressive lymphedema. Multiple custom compression garments are needed for optimal hygiene. Custom compression garments should be replaced q 3-6 months When worn consistently for optimal effectiveness.  6. Consider fitting with advanced Flexitouch PLUS sequential pneumatic compression device medically necessary to treat lipo-lymphedema extending above the inguinal lymph nodes, including the hips, abdomen, buttocks and abdomen. This device follows lymphatic anatomy and provides proximal to distal stimulation above the inguinal lymph nodes to ensure optimal lymphatic return via the thoracic duct.   ASSESSMENT:Pt had no difficulty tolerateing LLE MLD or multilayer, knee length compression wraps. L leg is well de congested. Skin is well hydrated and Pt reports less pain in legs in general. Cont as per POC.   Pt continues to tolerate all aspects of CDT. She is doing a nice job w LE self-care between sessions and appears to be further reduced below the knee volumetrically, albeit by a small amount. Still, as any volumetric reduction is appreciated in the context of lipo-lipedema, Pt and spouse are pleased the reduction achieved and sustained thus far in Rx.  Reapplied compression wraps after MLD. Cont as per POC.  OBJECTIVE IMPAIRMENTS: Abnormal gait, decreased activity tolerance, decreased balance, decreased knowledge of condition, decreased knowledge of use of DME, decreased mobility, difficulty walking, decreased ROM, increased edema, impaired sensation, obesity, pain, and chronic , progressive swelling and pain of buttocks, hips, abdomen and legs .   ACTIVITY LIMITATIONS: carrying, lifting, bending, sitting, standing, squatting, sleeping, stairs, transfers, bed mobility, bathing, dressing, hygiene/grooming, caring for others, and  leisure pursuits, social participation, productive/ work activities  PARTICIPATION LIMITATIONS: meal prep, cleaning, laundry, interpersonal relationship, driving, shopping, community activity, occupation, yard work, and church  PERSONAL FACTORS: Age, Past/current experiences, hx of panic attacks and claustrophobia, Time since onset of injury/illness/exacerbation,, motivation, supportive spouse and family are also affecting patient's functional outcome.   REHAB POTENTIAL: Fair Unfortunately the Lipedema does not respond to CDT and is unchanged by this protocol. Fortunately lymphedema  does respond to differing degrees based on the quality and amount of fatty fibrosis present obstructing lymphatics, and patient's tolerance if skin is hypersensitive.   EVALUATION COMPLEXITY: Moderate   GOALS: Goals reviewed with patient? Yes  SHORT TERM GOALS: Target date: 4th OT Rx visit   Pt will demonstrate understanding of lymphedema precautions and prevention strategies with modified independence using a printed reference to identify at least 5 precautions and discussing how s/he may implement them into daily life to reduce risk of progression with modified assistance ( printed reference). Baseline: Max A Goal status: 12/12/22 PROGRESSING  2.  Pt will be able to apply multilayer, thigh length, compression wraps using gradient techniques with Max caregiver assistance to decrease limb volume, to limit infection risk, and to limit lymphedema progression.  Baseline: Dependent Goal status: 11/10/22 Goal Met  LONG TERM GOALS: Target date: 01/11/23  Given this patient's Intake score of 36.76 % on the Lymphedema Life Impact Scale (LLIS), patient will experience a reduction of at least 5 points in her perceived level of functional impairment resulting from lymphedema to improve functional performance and quality of life (QOL). Baseline: 36.76% Goal status: 12/12/22 PROGRESSING  2.  Given this patient's Intake score of  56/100% on the functional outcomes FOTO tool, patient will experience  an increase in function of 3 points to improve basic and instrumental ADLs performance, including lymphedema self-care.  Baseline: 56% Goal status: 12/12/22 PROGRESSING  3.  During Intensive phase CDT Pt will achieve at least 85% compliance with all lymphedema self-care home program components, including  daily skin care, multilayer , gradient compression wraps with daily changes, daily simple self MLD and daily lymphatic pumping therex to achieve optimal clinical outcome and to habituate self care regime for optimal LE self-management over time. Baseline: Dependent Goal status: 12/12/22 PROGRESSING  4.  Pt will achieve at least a 10% volume reductions bilaterally below the knees to return limb to more typical size and shape, to limit infection risk and LE progression, to decrease pain, to improve function, and to improve body image and QOL. Baseline: Dependent Goal status: 12/12/22 PROGRESSING  5.  Pt will be able to don and doff appropriate compression garments and devices using assistive devices and extra time within 1 week of issue date for optimal lymphedema self-care. Baseline: Dependent Goal status: 12/12/22 PROGRESSING   PLAN:  PT FREQUENCY: 2x/week  PT DURATION: 12  weeks other: and PRN  PLANNED INTERVENTIONS: Therapeutic exercises, Therapeutic activity, Patient/Family education, Self Care, DME instructions, Manual lymph drainage, Compression bandaging, Manual therapy, and skin care during MLD, fit with appropriate custom compression garments and devices, fit with appropriate compression device  which  follows lymphatic pathways and anatomic distribution  PLAN FOR NEXT SESSION:  MLD Compression Skin care Pt/ family edu for LE self care   Loel Dubonnet, MS, OTR/L, CLT-LANA 12/26/22 3:58 PM

## 2022-12-28 ENCOUNTER — Ambulatory Visit: Payer: 59 | Admitting: Occupational Therapy

## 2022-12-28 ENCOUNTER — Encounter: Payer: Self-pay | Admitting: Occupational Therapy

## 2022-12-28 DIAGNOSIS — I890 Lymphedema, not elsewhere classified: Secondary | ICD-10-CM

## 2022-12-28 NOTE — Therapy (Signed)
OUTPATIENT OCCUPATIONAL THERAPY TREATMENT LOWER EXTREMITY LYMPHEDEMA  Patient Name: Mary [name] MRN: [id number] DOB:Nov 08, 1960, 62 y.o., female Today's Date: 12/28/2022    END OF SESSION:  OT End of Session - 12/28/22 1507     Visit Number 16    Number of Visits 36    Date for OT Re-Evaluation 01/11/23    OT Start Time 0300    OT Stop Time 0400    OT Time Calculation (min) 60 min    Activity Tolerance Patient tolerated treatment well;No increased pain    Behavior During Therapy WFL for tasks assessed/performed               Past Medical History:  Diagnosis Date   Anemia    low iron at times   Arthritis    Colon polyps    Complication of anesthesia 1991   Pt reports she awoke during plantar fascia surgery and felt "everything"   Heart murmur    " small per patient"    Hyperlipidemia    Hypertension    Hypothyroidism    Lactose intolerance    PAC (premature atrial contraction)    Palpitations    Panic attacks    Pt reports restrictive clothing/areas and dry mouth cause her to have attack.   Peripheral vascular disease (HCC)    "small" clot after thermal ablasion   Pneumonia 2016   "walking pneumonia" 3 times in 2016   Shortness of breath    going upstairs (due to weight). not so much since weight loss   Sleep apnea    after weight loss does not have to use cpap   Vitamin B12 deficiency    Vitamin D deficiency    Past Surgical History:  Procedure Laterality Date   APPENDECTOMY  1985   CESAREAN SECTION  1987   twins   COLONOSCOPY     COLONOSCOPY WITH PROPOFOL N/A 02/27/2018   Procedure: COLONOSCOPY WITH PROPOFOL;  Surgeon: Toledo, Boykin Nearing, MD;  Location: [facility];  Service: Gastroenterology;  Laterality: N/A;   IRRIGATION AND DEBRIDEMENT HEMATOMA Right 11/12/2015   Procedure: IRRIGATION AND DEBRIDEMENT HEMATOMA;  Surgeon: Earline Mayotte, MD;  Location: [facility];  Service: General;  Laterality: Right;   KNEE ARTHROSCOPY WITH MEDIAL MENISECTOMY  Left 07/15/2020   Procedure: Left knee arthroscopy with partial medial or lateral menisectomy, possible chondroplasty, possible partial synovectomy;  Surgeon: Lyndle Herrlich, MD;  Location: [facility];  Service: Orthopedics;  Laterality: Left;   LAMINECTOMY  07/10/2013   L4   L5   DR Yetta Barre   LAPAROSCOPIC SALPINGO OOPHERECTOMY Right 04/17/2014   Procedure: LAPAROSCOPIC SALPINGO OOPHORECTOMY RIGHT;  Surgeon: Adolphus Birchwood, MD;  Location: [facility];  Service: Gynecology;  Laterality: Right;   LYSIS OF ADHESION  04/17/2014   Procedure: LYSIS OF ADHESION;  Surgeon: Adolphus Birchwood, MD;  Location: [facility];  Service: Gynecology;;   MAXIMUM ACCESS (MAS)POSTERIOR LUMBAR INTERBODY FUSION (PLIF) 1 LEVEL  07/10/2013   Procedure: FOR MAXIMUM ACCESS (MAS) POSTERIOR LUMBAR INTERBODY FUSION (PLIF) LUMBAR FOUR-FIVE;  Surgeon: Tia Alert, MD;  Location: [facility];  Service: Neurosurgery;;  FOR MAXIMUM ACCESS (MAS) POSTERIOR LUMBAR INTERBODY FUSION (PLIF) LUMBAR FOUR-FIVE   OOPHORECTOMY     LSO   OPEN REDUCTION INTERNAL FIXATION (ORIF) DISTAL RADIAL FRACTURE Left 09/20/2018   Procedure: OPEN REDUCTION INTERNAL FIXATION (ORIF) DISTAL RADIAL FRACTURE, LEFT;  Surgeon: Kennedy Bucker, MD;  Location: [facility];  Service: Orthopedics;  Laterality: Left;   PLANTAR FASCIA SURGERY  1999  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   VAGINAL HYSTERECTOMY  2001   for bleeding and LSO for cyst   Patient Active Problem List   Diagnosis Date Noted   Reactive hypoglycemia 02/16/2022   Prediabetes 02/16/2022   Chronic venous insufficiency 12/23/2021   Lymphedema 12/13/2021   Panic attacks    Strain of knee 12/24/2019   Vitamin B12 deficiency 10/08/2019   Arthritis 07/16/2019   Edema 07/16/2019   History of abdominal pain 07/16/2019   Low back pain 07/16/2019   Vitamin D deficiency 07/16/2019   Hypothyroidism 02/28/2019   History of gastric bypass 02/28/2019   Obstructive sleep apnea syndrome 02/28/2019   Insomnia 02/28/2019   Class 3  severe obesity due to excess calories with serious comorbidity and body mass index (BMI) of 40.0 to 44.9 in adult (HCC) 02/28/2019   Elevated BP without diagnosis of hypertension 02/28/2019   Mixed hyperlipidemia 02/28/2019   Hot flashes 02/28/2019   BMI 40.0-44.9, adult (HCC) 12/16/2015   Hematoma of lower extremity 11/12/2015   Essential hypertension 06/20/2014   Pure hypercholesterolemia 06/20/2014   S/P lumbar spinal fusion 07/10/2013   Arthrodesis status 07/10/2013   Palpitations 01/02/2012   Edema of both legs 01/02/2012    PCP: Joni Reining, PA-C  REFERRING PROVIDER: Foster Simpson, DO  REFERRING DIAG: I89.0  THERAPY DIAG:  Lymphedema, not elsewhere classified  Rationale for Evaluation and Treatment: Rehabilitation  ONSET DATE: Insidious onset with progression over time. Greater than 20 years  SUBJECTIVE:                                                                                                                                                                                           SUBJECTIVE STATEMENT:Mary [name] presents to OT visit to address BLE lipo-lymphedema. Pt is accompanied by her supportive spouse, [name]. Pt denies LE-related pain in her legs.  Pt has no new complaints.  PERTINENT HISTORY and contributing factors include, OA, HTN, Panic attacks 2/2 restrictive clothing, OSA (not using CPAP), s/p hysterectomy 1987, L knee sx, oophorectomy, s/p hysterectomy 2001, Gastric Bypass 2007, s/p plantar fascia sx 1999, obesity, insomnia  PAIN:  Are you having pain? No: NPRS scale: 0/10 Pain location: BLE- generalized, knees Pain description: discomfort, heavy, tired, full, sore Aggravating factors: standing, walking, extended dependent sitting Relieving factors: elevation  PRECAUTIONS: Other: LYMPHEDEMA PRECAUTIONS: HYPOTHYROID, DIABETES SKIN PRECAUTIONS  WEIGHT BEARING RESTRICTIONS: No  FALLS:  Has patient fallen since last visit?  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  BLE COMPARATIVE LIMB VOLUMETRICS:  INTAKE: 11/01/22  LANDMARK RIGHT    R LEG (A-D) 5947.2 ml  R THIGH (E-G) 9750.0 ml  R FULL LIMB (A-G) 15697.2 ml  Limb Volume differential (LVD)  %  Volume change since initial %  Volume change overall V  (Blank rows = not tested)  LANDMARK LEFT   L LEG (A-D) 6011.6 ml  L THIGH (E-G) 9602.5 ml  L FULL LIMB (A-G) 15614.1 ml  Limb Volume differential (LVD)  LEG LVD = 1.8%, L>R % THIGH LVD = 1.5%, R>L FULL LIMB LVD = 0.53%, R>L  Volume change since initial %  Volume change overall %  (Blank rows = not tested)   9th VISIT 11/24/22  LANDMARK LEFT   L LEG (A-D) 5918.6 ml  L THIGH (E-G) 9265.5 ml  L FULL LIMB (A-G) 15184.1  ml  Limb Volume differential (LVD)  LEG volume = Decreased 1.5%;  THIGH volume decreased 3.5 %, AND  L FULL LIMB VOLUME dec BY 2.8 % SINCE 11/01/22  Volume change since initial %  Volume change overall %    TODAY'S TREATMENT:     MLD to LLE as established. Simultaneous skin care to limit infection. 2. LLE multilevel, knee length gradient compression  bandaging 3.  PATIENT EDUCATION: Reviewed progress towards goals to date. Continued Pt/ CG edu for lymphedema self care home program throughout session. Topics include outcome of comparative limb volumetrics- starting limb volume differentials (LVDs), technology and gradient techniques used for short stretch, multilayer compression wrapping, simple self-MLD, therapeutic lymphatic pumping exercises, skin/nail care, LE precautions,. compression garment recommendations and specifications, wear and care schedule and  compression garment donning / doffing w assistive devices. Discussed progress towards all OT goals since commencing CDT. All questions answered to the Pt's satisfaction. Good return.  Lymphedema management has a high burden of care, especially for Patients who have difficulty, or who are unable to reach their distal legs and feet.  reach their feet and distal legs to apply bandages, compression garments, to bathe , inspect skin and groom nails. In this case because Pt is unable to perform these activities, daily caregiver assistance with the lymphedema self-care home program  between OT visits is essential for achieving clinical success.  Person educated: Patient and Spouse Education method: Explanation, Demonstration, and Handouts Education comprehension: verbalized understanding, returned demonstration, and needs further education  HOME EXERCISE PROGRAM: Lymphatic Pumping Therex-  1 set of 10, 2 x daily, in order, seated  Daily skin inspection and care with low ph lotion matching skin ph to limit infection risk Simple Self-MLD 1 x daily During Intensive Phase CDT: multilayer compression wraps from foot to groin using gradient techniques, one limb at a time  to ensure safety During Self -Management Phase CDT: Fit with custom daytime compression garments: Elvarex, custom, flat knit, ccl 2 ( 23-32 mmHg) knee highs. Consider pairing with Compreshorts Capri length pantyhose for containment of buttocks, thighs and abdomen.  Fit with BLE knee length Jobst RELAX w/ open toe and zipper to limit ongoing tissue fibrosis formation and facilitate increased lymphatic function during HOS  Custom-made gradient compression garments and HOS devices are medically necessary in this case because they are uniquely sized and shaped to fit the exact dimensions of the affected extremities, and to provide accurate and  consistent gradient compression essential for optimally managing this patient's symptoms of chronic,  progressive lymphedema. Multiple custom compression garments are needed for optimal hygiene. Custom compression garments should be replaced q 3-6 months When worn consistently for optimal effectiveness.  6. Consider fitting with advanced Flexitouch PLUS sequential pneumatic compression device medically necessary to treat lipo-lymphedema extending above the inguinal lymph nodes, including the hips, abdomen, buttocks and abdomen. This device follows lymphatic anatomy and provides proximal to distal stimulation above the inguinal lymph nodes to ensure optimal lymphatic return via the thoracic duct.   ASSESSMENT:Pt had no difficulty tolerateing LLE MLD or multilayer, knee length compression wraps. L leg is well de congested. Skin is well hydrated and Pt reports less pain in legs in general. Pt continues to tolerate all aspects of CDT. She is doing a nice job w LE self-care between sessions and appears to be further reduced below the knee volumetrically, albeit by a small amount. Still, as any volumetric reduction is appreciated in the context of lipo-lipedema, Pt and spouse are pleased the reduction achieved and sustained thus far in Rx.  Reapplied compression wraps after MLD. Fit custom LLE compression garments ASAP and shift emphasis to RLE. Cont as per POC.  OBJECTIVE IMPAIRMENTS: Abnormal gait, decreased activity tolerance, decreased balance, decreased knowledge of condition, decreased knowledge of use of DME, decreased mobility, difficulty walking, decreased ROM, increased edema, impaired sensation, obesity, pain, and chronic , progressive swelling and pain of buttocks, hips, abdomen and legs .   ACTIVITY LIMITATIONS: carrying, lifting, bending, sitting, standing, squatting, sleeping, stairs, transfers, bed mobility, bathing, dressing, hygiene/grooming, caring for others, and leisure pursuits, social participation, productive/ work activities  PARTICIPATION LIMITATIONS: meal prep, cleaning, laundry,  interpersonal relationship, driving, shopping, community activity, occupation, yard work, and church  PERSONAL FACTORS: Age, Past/current experiences, hx of panic attacks and claustrophobia, Time since onset of injury/illness/exacerbation,, motivation, supportive spouse and family are also affecting patient's functional outcome.   REHAB POTENTIAL: Fair Unfortunately the Lipedema does not respond to CDT and is unchanged by this protocol. Fortunately lymphedema  does respond to differing degrees based on the quality and amount of fatty fibrosis present obstructing lymphatics, and patient's tolerance if skin is hypersensitive.   EVALUATION COMPLEXITY: Moderate   GOALS: Goals reviewed with patient? Yes  SHORT TERM GOALS: Target date: 4th OT Rx visit   Pt will demonstrate understanding of lymphedema precautions and prevention strategies with modified independence using a printed reference to identify at least 5 precautions and discussing how s/he may implement them into daily life to reduce risk of progression with modified assistance ( printed reference). Baseline: Max A Goal status: 12/12/22 PROGRESSING  2.  Pt will be able to apply multilayer, thigh length, compression wraps using gradient techniques with Max caregiver assistance to decrease limb volume, to limit infection risk, and to limit lymphedema progression.  Baseline: Dependent Goal status: 11/10/22 Goal Met  LONG TERM GOALS: Target date: 01/11/23  Given this patient's Intake score of 36.76 % on the Lymphedema Life Impact Scale (LLIS), patient will experience a reduction of at least 5 points in her perceived level of functional impairment resulting from lymphedema to improve functional performance and quality of life (QOL). Baseline: 36.76% Goal status: 12/12/22 PROGRESSING  2.  Given this patient's Intake score of 56/100% on the functional outcomes FOTO tool, patient will experience an increase in function of 3 points to improve basic and  instrumental ADLs performance, including lymphedema self-care.  Baseline: 56% Goal status: 12/12/22 PROGRESSING  3.  During Intensive phase CDT Pt will achieve at least 85% compliance with all lymphedema self-care home program components, including  daily skin care, multilayer , gradient compression wraps with daily changes, daily simple self MLD and daily lymphatic pumping therex to achieve optimal clinical outcome and to habituate self care regime for optimal LE self-management over time. Baseline: Dependent Goal status: 12/12/22 PROGRESSING  4.  Pt will achieve at least a 10% volume reductions bilaterally below the knees to return limb to more typical size and shape, to limit infection risk and LE progression, to decrease pain, to improve function, and to improve body image and QOL. Baseline: Dependent Goal status: 12/12/22 PROGRESSING  5.  Pt will be able to don and doff appropriate compression garments and devices using assistive devices and extra time within 1 week of issue date for optimal lymphedema self-care. Baseline: Dependent Goal status: 12/12/22 PROGRESSING   PLAN:  PT FREQUENCY: 2x/week  PT DURATION: 12  weeks other: and PRN  PLANNED INTERVENTIONS: Therapeutic exercises, Therapeutic activity, Patient/Family education, Self Care, DME instructions, Manual lymph drainage, Compression bandaging, Manual therapy, and skin care during MLD, fit with appropriate custom compression garments and devices, fit with appropriate compression device  which  follows lymphatic pathways and anatomic distribution  PLAN FOR NEXT SESSION:  MLD Compression Skin care Pt/ family edu for LE self care   Loel Dubonnet, MS, OTR/L, CLT-LANA 12/28/22 4:04 PM

## 2022-12-29 ENCOUNTER — Other Ambulatory Visit: Payer: Self-pay | Admitting: Unknown Physician Specialty

## 2022-12-29 DIAGNOSIS — R430 Anosmia: Secondary | ICD-10-CM

## 2023-01-02 ENCOUNTER — Ambulatory Visit
Admission: RE | Admit: 2023-01-02 | Discharge: 2023-01-02 | Disposition: A | Discharge status: Home / Self Care | Payer: 59 | Source: Ambulatory Visit | Attending: Unknown Physician Specialty | Admitting: Unknown Physician Specialty

## 2023-01-02 ENCOUNTER — Ambulatory Visit: Payer: 59 | Admitting: Occupational Therapy

## 2023-01-02 DIAGNOSIS — R430 Anosmia: Secondary | ICD-10-CM | POA: Insufficient documentation

## 2023-01-02 DIAGNOSIS — I890 Lymphedema, not elsewhere classified: Secondary | ICD-10-CM | POA: Diagnosis not present

## 2023-01-02 MED ORDER — GADOBUTROL 1 MMOL/ML IV SOLN
10.0000 mL | Freq: Once | INTRAVENOUS | Status: AC | PRN
  Administered 2023-01-02: 10 mL via INTRAVENOUS

## 2023-01-02 NOTE — Therapy (Signed)
OUTPATIENT OCCUPATIONAL THERAPY TREATMENT LOWER EXTREMITY LYMPHEDEMA  Patient Name: Mary [name] MRN: [id number] DOB:1961-01-05, 62 y.o., female Today's Date: 01/02/2023    END OF SESSION:  OT End of Session - 01/02/23 1516     Visit Number 17    Number of Visits 36    Date for OT Re-Evaluation 01/11/23    OT Start Time 0304    Activity Tolerance Patient tolerated treatment well;No increased pain    Behavior During Therapy WFL for tasks assessed/performed               Past Medical History:  Diagnosis Date   Anemia    low iron at times   Arthritis    Colon polyps    Complication of anesthesia 1991   Pt reports she awoke during plantar fascia surgery and felt "everything"   Heart murmur    " small per patient"    Hyperlipidemia    Hypertension    Hypothyroidism    Lactose intolerance    PAC (premature atrial contraction)    Palpitations    Panic attacks    Pt reports restrictive clothing/areas and dry mouth cause her to have attack.   Peripheral vascular disease (HCC)    "small" clot after thermal ablasion   Pneumonia 2016   "walking pneumonia" 3 times in 2016   Shortness of breath    going upstairs (due to weight). not so much since weight loss   Sleep apnea    after weight loss does not have to use cpap   Vitamin B12 deficiency    Vitamin D deficiency    Past Surgical History:  Procedure Laterality Date   APPENDECTOMY  1985   CESAREAN SECTION  1987   twins   COLONOSCOPY     COLONOSCOPY WITH PROPOFOL N/A 02/27/2018   Procedure: COLONOSCOPY WITH PROPOFOL;  Surgeon: Toledo, Boykin Nearing, MD;  Location: [facility];  Service: Gastroenterology;  Laterality: N/A;   IRRIGATION AND DEBRIDEMENT HEMATOMA Right 11/12/2015   Procedure: IRRIGATION AND DEBRIDEMENT HEMATOMA;  Surgeon: Earline Mayotte, MD;  Location: [facility];  Service: General;  Laterality: Right;   KNEE ARTHROSCOPY WITH MEDIAL MENISECTOMY Left 07/15/2020   Procedure: Left knee arthroscopy with  partial medial or lateral menisectomy, possible chondroplasty, possible partial synovectomy;  Surgeon: Lyndle Herrlich, MD;  Location: [facility];  Service: Orthopedics;  Laterality: Left;   LAMINECTOMY  07/10/2013   L4   L5   DR Yetta Barre   LAPAROSCOPIC SALPINGO OOPHERECTOMY Right 04/17/2014   Procedure: LAPAROSCOPIC SALPINGO OOPHORECTOMY RIGHT;  Surgeon: Adolphus Birchwood, MD;  Location: [facility];  Service: Gynecology;  Laterality: Right;   LYSIS OF ADHESION  04/17/2014   Procedure: LYSIS OF ADHESION;  Surgeon: Adolphus Birchwood, MD;  Location: [facility];  Service: Gynecology;;   MAXIMUM ACCESS (MAS)POSTERIOR LUMBAR INTERBODY FUSION (PLIF) 1 LEVEL  07/10/2013   Procedure: FOR MAXIMUM ACCESS (MAS) POSTERIOR LUMBAR INTERBODY FUSION (PLIF) LUMBAR FOUR-FIVE;  Surgeon: Tia Alert, MD;  Location: [facility];  Service: Neurosurgery;;  FOR MAXIMUM ACCESS (MAS) POSTERIOR LUMBAR INTERBODY FUSION (PLIF) LUMBAR FOUR-FIVE   OOPHORECTOMY     LSO   OPEN REDUCTION INTERNAL FIXATION (ORIF) DISTAL RADIAL FRACTURE Left 09/20/2018   Procedure: OPEN REDUCTION INTERNAL FIXATION (ORIF) DISTAL RADIAL FRACTURE, LEFT;  Surgeon: Kennedy Bucker, MD;  Location: [facility];  Service: Orthopedics;  Laterality: Left;   PLANTAR FASCIA SURGERY  1999   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   VAGINAL HYSTERECTOMY  2001   for bleeding  and LSO for cyst   Patient Active Problem List   Diagnosis Date Noted   Reactive hypoglycemia 02/16/2022   Prediabetes 02/16/2022   Chronic venous insufficiency 12/23/2021   Lymphedema 12/13/2021   Panic attacks    Strain of knee 12/24/2019   Vitamin B12 deficiency 10/08/2019   Arthritis 07/16/2019   Edema 07/16/2019   History of abdominal pain 07/16/2019   Low back pain 07/16/2019   Vitamin D deficiency 07/16/2019   Hypothyroidism 02/28/2019   History of gastric bypass 02/28/2019   Obstructive sleep apnea syndrome 02/28/2019   Insomnia 02/28/2019   Class 3 severe obesity due to excess calories with serious  comorbidity and body mass index (BMI) of 40.0 to 44.9 in adult (HCC) 02/28/2019   Elevated BP without diagnosis of hypertension 02/28/2019   Mixed hyperlipidemia 02/28/2019   Hot flashes 02/28/2019   BMI 40.0-44.9, adult (HCC) 12/16/2015   Hematoma of lower extremity 11/12/2015   Essential hypertension 06/20/2014   Pure hypercholesterolemia 06/20/2014   S/P lumbar spinal fusion 07/10/2013   Arthrodesis status 07/10/2013   Palpitations 01/02/2012   Edema of both legs 01/02/2012    PCP: Joni Reining, PA-C  REFERRING PROVIDER: Foster Simpson, DO  REFERRING DIAG: I89.0  THERAPY DIAG:  Lymphedema, not elsewhere classified  Rationale for Evaluation and Treatment: Rehabilitation  ONSET DATE: Insidious onset with progression over time. Greater than 20 years  SUBJECTIVE:                                                                                                                                                                                           SUBJECTIVE STATEMENT:Mary [name] presents to OT visit to address BLE lipo-lymphedema. Pt is accompanied by her supportive spouse, Mary [name]. Pt denies LE-related pain in her legs.  Pt had not had call .  PERTINENT HISTORY and contributing factors include, OA, HTN, Panic attacks 2/2 restrictive clothing, OSA (not using CPAP), s/p hysterectomy 1987, L knee sx, oophorectomy, s/p hysterectomy 2001, Gastric Bypass 2007, s/p plantar fascia sx 1999, obesity, insomnia  PAIN:  Are you having pain? No: NPRS scale: 0/10 Pain location: BLE- generalized, knees Pain description: discomfort, heavy, tired, full, sore Aggravating factors: standing, walking, extended dependent sitting Relieving factors: elevation  PRECAUTIONS: Other: LYMPHEDEMA PRECAUTIONS: HYPOTHYROID, DIABETES SKIN PRECAUTIONS  WEIGHT BEARING RESTRICTIONS: No  FALLS:  Has patient fallen since last visit? 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  BLE COMPARATIVE LIMB VOLUMETRICS:  INTAKE: 11/01/22  LANDMARK RIGHT    R LEG (A-D) 5947.2 ml  R THIGH (E-G) 9750.0 ml  R FULL LIMB (A-G) 15697.2 ml  Limb Volume differential (LVD)  %  Volume change since initial %  Volume change overall V  (Blank rows = not tested)  LANDMARK LEFT   L LEG (A-D) 6011.6 ml  L THIGH (E-G) 9602.5 ml  L FULL LIMB (A-G) 15614.1 ml  Limb Volume differential (LVD)  LEG LVD = 1.8%, L>R % THIGH LVD = 1.5%, R>L FULL LIMB LVD = 0.53%, R>L  Volume change since initial %  Volume change overall %  (Blank rows = not tested)   9th VISIT 11/24/22  LANDMARK LEFT   L LEG (A-D) 5918.6 ml  L THIGH (E-G) 9265.5 ml  L FULL LIMB (A-G) 15184.1  ml  Limb Volume differential (LVD)  LEG volume = Decreased 1.5%;  THIGH volume decreased 3.5 %, AND  L FULL LIMB VOLUME dec BY 2.8 % SINCE 11/01/22  Volume change since initial %  Volume change overall %    TODAY'S TREATMENT:     MLD to LLE as established. Simultaneous skin care to limit infection. 2. LLE multilevel, knee length gradient compression  bandaging 3.  PATIENT EDUCATION: Reviewed progress towards goals to date. Continued Pt/ CG edu for lymphedema self care home program throughout session. Topics include outcome of comparative limb volumetrics- starting limb volume differentials (LVDs), technology and gradient techniques used for short stretch, multilayer compression wrapping, simple self-MLD, therapeutic lymphatic pumping exercises, skin/nail care, LE precautions,. compression garment recommendations and specifications, wear and care schedule and compression garment donning / doffing w assistive devices. Discussed  progress towards all OT goals since commencing CDT. All questions answered to the Pt's satisfaction. Good return.  Lymphedema management has a high burden of care, especially for Patients who have difficulty, or who are unable to reach their distal legs and feet.  reach their feet and distal legs to apply bandages, compression garments, to bathe , inspect skin and groom nails. In this case because Pt is unable to perform these activities, daily caregiver assistance with the lymphedema self-care home program  between OT visits is essential for achieving clinical success.  Person educated: Patient and Spouse Education method: Explanation, Demonstration, and Handouts Education comprehension: verbalized understanding, returned demonstration, and needs further education  HOME EXERCISE PROGRAM: Lymphatic Pumping Therex-  1 set of 10, 2 x daily, in order, seated  Daily skin inspection and care with low ph lotion matching skin ph to limit infection risk Simple Self-MLD 1 x daily During Intensive Phase CDT: multilayer compression wraps from foot to groin using gradient techniques, one limb at a time  to ensure safety During Self -Management Phase CDT: Fit with custom daytime compression garments: Elvarex, custom, flat knit, ccl 2 ( 23-32 mmHg) knee highs. Consider pairing with Compreshorts Capri length pantyhose for containment of buttocks, thighs and abdomen.  Fit with BLE knee length Jobst RELAX w/ open toe and zipper to limit ongoing tissue fibrosis formation and facilitate increased lymphatic function during HOS  Custom-made gradient compression garments and HOS devices are medically necessary in this case because they are uniquely sized and shaped to fit the exact dimensions of the affected extremities, and to provide accurate and consistent gradient compression essential for optimally managing this patient's symptoms of chronic, progressive lymphedema. Multiple  custom compression garments are needed for  optimal hygiene. Custom compression garments should be replaced q 3-6 months When worn consistently for optimal effectiveness.  6. Consider fitting with advanced Flexitouch PLUS sequential pneumatic compression device medically necessary to treat lipo-lymphedema extending above the inguinal lymph nodes, including the hips, abdomen, buttocks and abdomen. This device follows lymphatic anatomy and provides proximal to distal stimulation above the inguinal lymph nodes to ensure optimal lymphatic return via the thoracic duct.   ASSESSMENT:Pt had no difficulty tolerateing LLE MLD or multilayer, knee length compression wraps. L leg is well de congested. Skin is well hydrated and Pt reports less pain in legs in general. Pt continues to tolerate all aspects of CDT. She is doing a nice job w LE self-care between sessions and appears to be further reduced below the knee volumetrically, albeit by a small amount. Pt tolerated MLD to LLE/LLQ as established w out increased pain. We skipped compression wraps at end of session as Pt on her way to get an MRI . Cont as per POC.  OBJECTIVE IMPAIRMENTS: Abnormal gait, decreased activity tolerance, decreased balance, decreased knowledge of condition, decreased knowledge of use of DME, decreased mobility, difficulty walking, decreased ROM, increased edema, impaired sensation, obesity, pain, and chronic , progressive swelling and pain of buttocks, hips, abdomen and legs .   ACTIVITY LIMITATIONS: carrying, lifting, bending, sitting, standing, squatting, sleeping, stairs, transfers, bed mobility, bathing, dressing, hygiene/grooming, caring for others, and leisure pursuits, social participation, productive/ work activities  PARTICIPATION LIMITATIONS: meal prep, cleaning, laundry, interpersonal relationship, driving, shopping, community activity, occupation, yard work, and church  PERSONAL FACTORS: Age, Past/current experiences, hx of panic attacks and claustrophobia, Time since  onset of injury/illness/exacerbation,, motivation, supportive spouse and family are also affecting patient's functional outcome.   REHAB POTENTIAL: Fair Unfortunately the Lipedema does not respond to CDT and is unchanged by this protocol. Fortunately lymphedema  does respond to differing degrees based on the quality and amount of fatty fibrosis present obstructing lymphatics, and patient's tolerance if skin is hypersensitive.   EVALUATION COMPLEXITY: Moderate   GOALS: Goals reviewed with patient? Yes  SHORT TERM GOALS: Target date: 4th OT Rx visit   Pt will demonstrate understanding of lymphedema precautions and prevention strategies with modified independence using a printed reference to identify at least 5 precautions and discussing how s/he may implement them into daily life to reduce risk of progression with modified assistance ( printed reference). Baseline: Max A Goal status: 12/12/22 PROGRESSING  2.  Pt will be able to apply multilayer, thigh length, compression wraps using gradient techniques with Max caregiver assistance to decrease limb volume, to limit infection risk, and to limit lymphedema progression.  Baseline: Dependent Goal status: 11/10/22 Goal Met  LONG TERM GOALS: Target date: 01/11/23  Given this patient's Intake score of 36.76 % on the Lymphedema Life Impact Scale (LLIS), patient will experience a reduction of at least 5 points in her perceived level of functional impairment resulting from lymphedema to improve functional performance and quality of life (QOL). Baseline: 36.76% Goal status: 12/12/22 PROGRESSING  2.  Given this patient's Intake score of 56/100% on the functional outcomes FOTO tool, patient will experience an increase in function of 3 points to improve basic and instrumental ADLs performance, including lymphedema self-care.  Baseline: 56% Goal status: 12/12/22 PROGRESSING  3.  During Intensive phase CDT Pt will achieve at least 85% compliance with all  lymphedema self-care home program components, including  daily skin care, multilayer , gradient compression wraps with  daily changes, daily simple self MLD and daily lymphatic pumping therex to achieve optimal clinical outcome and to habituate self care regime for optimal LE self-management over time. Baseline: Dependent Goal status: 12/12/22 PROGRESSING  4.  Pt will achieve at least a 10% volume reductions bilaterally below the knees to return limb to more typical size and shape, to limit infection risk and LE progression, to decrease pain, to improve function, and to improve body image and QOL. Baseline: Dependent Goal status: 12/12/22 PROGRESSING  5.  Pt will be able to don and doff appropriate compression garments and devices using assistive devices and extra time within 1 week of issue date for optimal lymphedema self-care. Baseline: Dependent Goal status: 12/12/22 PROGRESSING   PLAN:  PT FREQUENCY: 2x/week  PT DURATION: 12  weeks other: and PRN  PLANNED INTERVENTIONS: Therapeutic exercises, Therapeutic activity, Patient/Family education, Self Care, DME instructions, Manual lymph drainage, Compression bandaging, Manual therapy, and skin care during MLD, fit with appropriate custom compression garments and devices, fit with appropriate compression device  which  follows lymphatic pathways and anatomic distribution  PLAN FOR NEXT SESSION:  MLD Compression Skin care Pt/ family edu for LE self care   Loel Dubonnet, MS, OTR/L, CLT-LANA 01/02/23 3:17 PM

## 2023-01-04 ENCOUNTER — Encounter: Payer: Self-pay | Admitting: Occupational Therapy

## 2023-01-04 ENCOUNTER — Ambulatory Visit: Payer: 59 | Admitting: Occupational Therapy

## 2023-01-04 DIAGNOSIS — I890 Lymphedema, not elsewhere classified: Secondary | ICD-10-CM

## 2023-01-04 NOTE — Therapy (Signed)
OUTPATIENT OCCUPATIONAL THERAPY TREATMENT LOWER EXTREMITY LYMPHEDEMA  Patient Name: Mary [name] MRN: [id number] DOB:04-06-1961, 62 y.o., female Today's Date: 01/04/2023    END OF SESSION:  OT End of Session - 01/04/23 1504     Visit Number 18    Number of Visits 36    Date for OT Re-Evaluation 01/11/23    OT Start Time 0300    OT Stop Time 0359    OT Time Calculation (min) 59 min    Activity Tolerance Patient tolerated treatment well;No increased pain    Behavior During Therapy WFL for tasks assessed/performed               Past Medical History:  Diagnosis Date   Anemia    low iron at times   Arthritis    Colon polyps    Complication of anesthesia 1991   Pt reports she awoke during plantar fascia surgery and felt "everything"   Heart murmur    " small per patient"    Hyperlipidemia    Hypertension    Hypothyroidism    Lactose intolerance    PAC (premature atrial contraction)    Palpitations    Panic attacks    Pt reports restrictive clothing/areas and dry mouth cause her to have attack.   Peripheral vascular disease (HCC)    "small" clot after thermal ablasion   Pneumonia 2016   "walking pneumonia" 3 times in 2016   Shortness of breath    going upstairs (due to weight). not so much since weight loss   Sleep apnea    after weight loss does not have to use cpap   Vitamin B12 deficiency    Vitamin D deficiency    Past Surgical History:  Procedure Laterality Date   APPENDECTOMY  1985   CESAREAN SECTION  1987   twins   COLONOSCOPY     COLONOSCOPY WITH PROPOFOL N/A 02/27/2018   Procedure: COLONOSCOPY WITH PROPOFOL;  Surgeon: Toledo, Boykin Nearing, MD;  Location: [facility];  Service: Gastroenterology;  Laterality: N/A;   IRRIGATION AND DEBRIDEMENT HEMATOMA Right 11/12/2015   Procedure: IRRIGATION AND DEBRIDEMENT HEMATOMA;  Surgeon: Earline Mayotte, MD;  Location: [facility];  Service: General;  Laterality: Right;   KNEE ARTHROSCOPY WITH MEDIAL MENISECTOMY  Left 07/15/2020   Procedure: Left knee arthroscopy with partial medial or lateral menisectomy, possible chondroplasty, possible partial synovectomy;  Surgeon: Lyndle Herrlich, MD;  Location: [facility];  Service: Orthopedics;  Laterality: Left;   LAMINECTOMY  07/10/2013   L4   L5   DR Yetta Barre   LAPAROSCOPIC SALPINGO OOPHERECTOMY Right 04/17/2014   Procedure: LAPAROSCOPIC SALPINGO OOPHORECTOMY RIGHT;  Surgeon: Adolphus Birchwood, MD;  Location: [facility];  Service: Gynecology;  Laterality: Right;   LYSIS OF ADHESION  04/17/2014   Procedure: LYSIS OF ADHESION;  Surgeon: Adolphus Birchwood, MD;  Location: [facility];  Service: Gynecology;;   MAXIMUM ACCESS (MAS)POSTERIOR LUMBAR INTERBODY FUSION (PLIF) 1 LEVEL  07/10/2013   Procedure: FOR MAXIMUM ACCESS (MAS) POSTERIOR LUMBAR INTERBODY FUSION (PLIF) LUMBAR FOUR-FIVE;  Surgeon: Tia Alert, MD;  Location: [facility];  Service: Neurosurgery;;  FOR MAXIMUM ACCESS (MAS) POSTERIOR LUMBAR INTERBODY FUSION (PLIF) LUMBAR FOUR-FIVE   OOPHORECTOMY     LSO   OPEN REDUCTION INTERNAL FIXATION (ORIF) DISTAL RADIAL FRACTURE Left 09/20/2018   Procedure: OPEN REDUCTION INTERNAL FIXATION (ORIF) DISTAL RADIAL FRACTURE, LEFT;  Surgeon: Kennedy Bucker, MD;  Location: [facility];  Service: Orthopedics;  Laterality: Left;   PLANTAR FASCIA SURGERY  1999  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   VAGINAL HYSTERECTOMY  2001   for bleeding and LSO for cyst   Patient Active Problem List   Diagnosis Date Noted   Reactive hypoglycemia 02/16/2022   Prediabetes 02/16/2022   Chronic venous insufficiency 12/23/2021   Lymphedema 12/13/2021   Panic attacks    Strain of knee 12/24/2019   Vitamin B12 deficiency 10/08/2019   Arthritis 07/16/2019   Edema 07/16/2019   History of abdominal pain 07/16/2019   Low back pain 07/16/2019   Vitamin D deficiency 07/16/2019   Hypothyroidism 02/28/2019   History of gastric bypass 02/28/2019   Obstructive sleep apnea syndrome 02/28/2019   Insomnia 02/28/2019   Class 3  severe obesity due to excess calories with serious comorbidity and body mass index (BMI) of 40.0 to 44.9 in adult (HCC) 02/28/2019   Elevated BP without diagnosis of hypertension 02/28/2019   Mixed hyperlipidemia 02/28/2019   Hot flashes 02/28/2019   BMI 40.0-44.9, adult (HCC) 12/16/2015   Hematoma of lower extremity 11/12/2015   Essential hypertension 06/20/2014   Pure hypercholesterolemia 06/20/2014   S/P lumbar spinal fusion 07/10/2013   Arthrodesis status 07/10/2013   Palpitations 01/02/2012   Edema of both legs 01/02/2012    PCP: Joni Reining, PA-C  REFERRING PROVIDER: Foster Simpson, DO  REFERRING DIAG: I89.0  THERAPY DIAG:  Lymphedema, not elsewhere classified  Rationale for Evaluation and Treatment: Rehabilitation  ONSET DATE: Insidious onset with progression over time. Greater than 20 years  SUBJECTIVE:                                                                                                                                                                                          SUBJECTIVE STATEMENT:Mary [name] presents to OT visit to address BLE lipo-lymphedema. Pt is accompanied by her supportive spouse, [name]. Pt denies LE-related pain in her legs.  PERTINENT HISTORY and contributing factors include, OA, HTN, Panic attacks 2/2 restrictive clothing, OSA (not using CPAP), s/p hysterectomy 1987, L knee sx, oophorectomy, s/p hysterectomy 2001, Gastric Bypass 2007, s/p plantar fascia sx 1999, obesity, insomnia  PAIN:  Are you having pain? No: NPRS scale: 0/10 Pain location: BLE- generalized, knees Pain description: discomfort, heavy, tired, full, sore Aggravating factors: standing, walking, extended dependent sitting Relieving factors: elevation  PRECAUTIONS: Other: LYMPHEDEMA PRECAUTIONS: HYPOTHYROID, DIABETES SKIN PRECAUTIONS  WEIGHT BEARING RESTRICTIONS: No  FALLS:  Has patient fallen since last visit? 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  BLE COMPARATIVE LIMB VOLUMETRICS:  INTAKE: 11/01/22  LANDMARK RIGHT    R LEG (A-D) 5947.2 ml  R THIGH (E-G) 9750.0 ml  R FULL LIMB (A-G) 15697.2 ml  Limb Volume differential (LVD)  %  Volume change since initial %  Volume change overall V  (Blank rows = not tested)  LANDMARK LEFT   L LEG (A-D) 6011.6 ml  L THIGH (E-G) 9602.5 ml  L FULL LIMB (A-G) 15614.1 ml  Limb Volume differential (LVD)  LEG LVD = 1.8%, L>R % THIGH LVD = 1.5%, R>L FULL LIMB LVD = 0.53%, R>L  Volume change since initial %  Volume change overall %  (Blank rows = not tested)   9th VISIT 11/24/22  LANDMARK LEFT   L LEG (A-D) 5918.6 ml  L THIGH (E-G) 9265.5 ml  L FULL LIMB (A-G) 15184.1  ml  Limb Volume differential (LVD)  LEG volume = Decreased 1.5%;  THIGH volume decreased 3.5 %, AND  L FULL LIMB VOLUME dec BY 2.8 % SINCE 11/01/22  Volume change since initial %  Volume change overall %    TODAY'S TREATMENT:     MLD to LLE as established. Simultaneous skin care to limit infection. 2. LLE multilevel, knee length gradient compression  bandaging 3.  PATIENT EDUCATION: Reviewed progress towards goals to date. Continued Pt/ CG edu for lymphedema self care home program throughout session. Topics include outcome of comparative limb volumetrics- starting limb volume differentials (LVDs), technology and gradient techniques used for short stretch, multilayer compression wrapping, simple self-MLD, therapeutic lymphatic pumping exercises, skin/nail care, LE precautions,. compression garment recommendations and specifications, wear and care schedule and compression garment donning / doffing w assistive  devices. Discussed progress towards all OT goals since commencing CDT. All questions answered to the Pt's satisfaction. Good return.  Lymphedema management has a high burden of care, especially for Patients who have difficulty, or who are unable to reach their distal legs and feet.  reach their feet and distal legs to apply bandages, compression garments, to bathe , inspect skin and groom nails. In this case because Pt is unable to perform these activities, daily caregiver assistance with the lymphedema self-care home program  between OT visits is essential for achieving clinical success.  Person educated: Patient and Spouse Education method: Explanation, Demonstration, and Handouts Education comprehension: verbalized understanding, returned demonstration, and needs further education  HOME EXERCISE PROGRAM: Lymphatic Pumping Therex-  1 set of 10, 2 x daily, in order, seated  Daily skin inspection and care with low ph lotion matching skin ph to limit infection risk Simple Self-MLD 1 x daily During Intensive Phase CDT: multilayer compression wraps from foot to groin using gradient techniques, one limb at a time  to ensure safety During Self -Management Phase CDT: Fit with custom daytime compression garments: Elvarex, custom, flat knit, ccl 2 ( 23-32 mmHg) knee highs. Consider pairing with Compreshorts Capri length pantyhose for containment of buttocks, thighs and abdomen.  Fit with BLE knee length Jobst RELAX w/ open toe and zipper to limit ongoing tissue fibrosis formation and facilitate increased lymphatic function during HOS  Custom-made gradient compression garments and HOS devices are medically necessary in this case because they are uniquely sized and shaped to fit the exact dimensions of the affected extremities, and to provide accurate and consistent gradient compression essential for optimally managing  this patient's symptoms of chronic, progressive lymphedema. Multiple custom compression  garments are needed for optimal hygiene. Custom compression garments should be replaced q 3-6 months When worn consistently for optimal effectiveness.  6. Consider fitting with advanced Flexitouch PLUS sequential pneumatic compression device medically necessary to treat lipo-lymphedema extending above the inguinal lymph nodes, including the hips, abdomen, buttocks and abdomen. This device follows lymphatic anatomy and provides proximal to distal stimulation above the inguinal lymph nodes to ensure optimal lymphatic return via the thoracic duct.   ASSESSMENT:Pt had no difficulty tolerateing LLE MLD or multilayer, knee length compression wraps. L leg is well de congested. Skin is well hydrated and Pt reports less pain in legs in general. Pt continues to tolerate all aspects of CDT. She is doing a nice job w LE self-care between sessions and appears to be further reduced below the knee volumetrically, albeit by a small amount. Pt tolerated MLD to LLE/LLQ as established w out increased pain. Multilayer knee length wraps applied as established. Cont as per POC.  OBJECTIVE IMPAIRMENTS: Abnormal gait, decreased activity tolerance, decreased balance, decreased knowledge of condition, decreased knowledge of use of DME, decreased mobility, difficulty walking, decreased ROM, increased edema, impaired sensation, obesity, pain, and chronic , progressive swelling and pain of buttocks, hips, abdomen and legs .   ACTIVITY LIMITATIONS: carrying, lifting, bending, sitting, standing, squatting, sleeping, stairs, transfers, bed mobility, bathing, dressing, hygiene/grooming, caring for others, and leisure pursuits, social participation, productive/ work activities  PARTICIPATION LIMITATIONS: meal prep, cleaning, laundry, interpersonal relationship, driving, shopping, community activity, occupation, yard work, and church  PERSONAL FACTORS: Age, Past/current experiences, hx of panic attacks and claustrophobia, Time since onset  of injury/illness/exacerbation,, motivation, supportive spouse and family are also affecting patient's functional outcome.   REHAB POTENTIAL: Fair Unfortunately the Lipedema does not respond to CDT and is unchanged by this protocol. Fortunately lymphedema  does respond to differing degrees based on the quality and amount of fatty fibrosis present obstructing lymphatics, and patient's tolerance if skin is hypersensitive.   EVALUATION COMPLEXITY: Moderate   GOALS: Goals reviewed with patient? Yes  SHORT TERM GOALS: Target date: 4th OT Rx visit   Pt will demonstrate understanding of lymphedema precautions and prevention strategies with modified independence using a printed reference to identify at least 5 precautions and discussing how s/he may implement them into daily life to reduce risk of progression with modified assistance ( printed reference). Baseline: Max A Goal status: 12/12/22 PROGRESSING  2.  Pt will be able to apply multilayer, thigh length, compression wraps using gradient techniques with Max caregiver assistance to decrease limb volume, to limit infection risk, and to limit lymphedema progression.  Baseline: Dependent Goal status: 11/10/22 Goal Met  LONG TERM GOALS: Target date: 01/11/23  Given this patient's Intake score of 36.76 % on the Lymphedema Life Impact Scale (LLIS), patient will experience a reduction of at least 5 points in her perceived level of functional impairment resulting from lymphedema to improve functional performance and quality of life (QOL). Baseline: 36.76% Goal status: 12/12/22 PROGRESSING  2.  Given this patient's Intake score of 56/100% on the functional outcomes FOTO tool, patient will experience an increase in function of 3 points to improve basic and instrumental ADLs performance, including lymphedema self-care.  Baseline: 56% Goal status: 12/12/22 PROGRESSING  3.  During Intensive phase CDT Pt will achieve at least 85% compliance with all lymphedema  self-care home program components, including  daily skin care, multilayer , gradient compression wraps with daily changes, daily  simple self MLD and daily lymphatic pumping therex to achieve optimal clinical outcome and to habituate self care regime for optimal LE self-management over time. Baseline: Dependent Goal status: 12/12/22 PROGRESSING  4.  Pt will achieve at least a 10% volume reductions bilaterally below the knees to return limb to more typical size and shape, to limit infection risk and LE progression, to decrease pain, to improve function, and to improve body image and QOL. Baseline: Dependent Goal status: 12/12/22 PROGRESSING  5.  Pt will be able to don and doff appropriate compression garments and devices using assistive devices and extra time within 1 week of issue date for optimal lymphedema self-care. Baseline: Dependent Goal status: 12/12/22 PROGRESSING   PLAN:  PT FREQUENCY: 2x/week  PT DURATION: 12  weeks other: and PRN  PLANNED INTERVENTIONS: Therapeutic exercises, Therapeutic activity, Patient/Family education, Self Care, DME instructions, Manual lymph drainage, Compression bandaging, Manual therapy, and skin care during MLD, fit with appropriate custom compression garments and devices, fit with appropriate compression device  which  follows lymphatic pathways and anatomic distribution  PLAN FOR NEXT SESSION:  MLD Compression Skin care Pt/ family edu for LE self care   Loel Dubonnet, MS, OTR/L, CLT-LANA 01/04/23 4:01 PM

## 2023-01-09 ENCOUNTER — Other Ambulatory Visit: Payer: 59

## 2023-01-09 ENCOUNTER — Ambulatory Visit: Payer: 59 | Attending: Plastic Surgery | Admitting: Occupational Therapy

## 2023-01-09 DIAGNOSIS — I890 Lymphedema, not elsewhere classified: Secondary | ICD-10-CM | POA: Insufficient documentation

## 2023-01-09 NOTE — Therapy (Signed)
OUTPATIENT OCCUPATIONAL THERAPY TREATMENT LOWER EXTREMITY LYMPHEDEMA  Patient Name: Mary [name] MRN: [id number] DOB:Dec 02, 1960, 62 y.o., female Today's Date: 01/17/2023    END OF SESSION:      Past Medical History:  Diagnosis Date   Anemia    low iron at times   Arthritis    Colon polyps    Complication of anesthesia 1991   Pt reports she awoke during plantar fascia surgery and felt "everything"   Heart murmur    " small per patient"    Hyperlipidemia    Hypertension    Hypothyroidism    Lactose intolerance    PAC (premature atrial contraction)    Palpitations    Panic attacks    Pt reports restrictive clothing/areas and dry mouth cause her to have attack.   Peripheral vascular disease (HCC)    "small" clot after thermal ablasion   Pneumonia 2016   "walking pneumonia" 3 times in 2016   Shortness of breath    going upstairs (due to weight). not so much since weight loss   Sleep apnea    after weight loss does not have to use cpap   Vitamin B12 deficiency    Vitamin D deficiency    Past Surgical History:  Procedure Laterality Date   APPENDECTOMY  1985   CESAREAN SECTION  1987   twins   COLONOSCOPY     COLONOSCOPY WITH PROPOFOL N/A 02/27/2018   Procedure: COLONOSCOPY WITH PROPOFOL;  Surgeon: Toledo, Boykin Nearing, MD;  Location: [facility];  Service: Gastroenterology;  Laterality: N/A;   IRRIGATION AND DEBRIDEMENT HEMATOMA Right 11/12/2015   Procedure: IRRIGATION AND DEBRIDEMENT HEMATOMA;  Surgeon: Earline Mayotte, MD;  Location: [facility];  Service: General;  Laterality: Right;   KNEE ARTHROSCOPY WITH MEDIAL MENISECTOMY Left 07/15/2020   Procedure: Left knee arthroscopy with partial medial or lateral menisectomy, possible chondroplasty, possible partial synovectomy;  Surgeon: Lyndle Herrlich, MD;  Location: [facility];  Service: Orthopedics;  Laterality: Left;   LAMINECTOMY  07/10/2013   L4   L5   DR Yetta Barre   LAPAROSCOPIC SALPINGO OOPHERECTOMY Right 04/17/2014    Procedure: LAPAROSCOPIC SALPINGO OOPHORECTOMY RIGHT;  Surgeon: Adolphus Birchwood, MD;  Location: [facility];  Service: Gynecology;  Laterality: Right;   LYSIS OF ADHESION  04/17/2014   Procedure: LYSIS OF ADHESION;  Surgeon: Adolphus Birchwood, MD;  Location: [facility];  Service: Gynecology;;   MAXIMUM ACCESS (MAS)POSTERIOR LUMBAR INTERBODY FUSION (PLIF) 1 LEVEL  07/10/2013   Procedure: FOR MAXIMUM ACCESS (MAS) POSTERIOR LUMBAR INTERBODY FUSION (PLIF) LUMBAR FOUR-FIVE;  Surgeon: Tia Alert, MD;  Location: [facility];  Service: Neurosurgery;;  FOR MAXIMUM ACCESS (MAS) POSTERIOR LUMBAR INTERBODY FUSION (PLIF) LUMBAR FOUR-FIVE   OOPHORECTOMY     LSO   OPEN REDUCTION INTERNAL FIXATION (ORIF) DISTAL RADIAL FRACTURE Left 09/20/2018   Procedure: OPEN REDUCTION INTERNAL FIXATION (ORIF) DISTAL RADIAL FRACTURE, LEFT;  Surgeon: Kennedy Bucker, MD;  Location: [facility];  Service: Orthopedics;  Laterality: Left;   PLANTAR FASCIA SURGERY  1999   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   VAGINAL HYSTERECTOMY  2001   for bleeding and LSO for cyst   Patient Active Problem List   Diagnosis Date Noted   Reactive hypoglycemia 02/16/2022   Prediabetes 02/16/2022   Chronic venous insufficiency 12/23/2021   Lymphedema 12/13/2021   Panic attacks    Strain of knee 12/24/2019   Vitamin B12 deficiency 10/08/2019   Arthritis 07/16/2019   Edema 07/16/2019   History of abdominal pain 07/16/2019  Low back pain 07/16/2019   Vitamin D deficiency 07/16/2019   Hypothyroidism 02/28/2019   History of gastric bypass 02/28/2019   Obstructive sleep apnea syndrome 02/28/2019   Insomnia 02/28/2019   Class 3 severe obesity due to excess calories with serious comorbidity and body mass index (BMI) of 40.0 to 44.9 in adult (HCC) 02/28/2019   Elevated BP without diagnosis of hypertension 02/28/2019   Mixed hyperlipidemia 02/28/2019   Hot flashes 02/28/2019   BMI 40.0-44.9, adult (HCC) 12/16/2015   Hematoma of lower extremity 11/12/2015   Essential  hypertension 06/20/2014   Pure hypercholesterolemia 06/20/2014   S/P lumbar spinal fusion 07/10/2013   Arthrodesis status 07/10/2013   Palpitations 01/02/2012   Edema of both legs 01/02/2012    PCP: Joni Reining, PA-C  REFERRING PROVIDER: Foster Simpson, DO  REFERRING DIAG: I89.0  THERAPY DIAG:  Lymphedema, not elsewhere classified  Rationale for Evaluation and Treatment: Rehabilitation  ONSET DATE: Insidious onset with progression over time. Greater than 20 years  SUBJECTIVE:                                                                                                                                                                                          SUBJECTIVE STATEMENT:Mary [name] presents to OT visit to address BLE lipo-lymphedema. Pt is accompanied by her supportive spouse, Mary [name]. Pt denies LE-related pain in her legs.  PERTINENT HISTORY and contributing factors include, OA, HTN, Panic attacks 2/2 restrictive clothing, OSA (not using CPAP), s/p hysterectomy 1987, L knee sx, oophorectomy, s/p hysterectomy 2001, Gastric Bypass 2007, s/p plantar fascia sx 1999, obesity, insomnia  PAIN:  Are you having pain? No: NPRS scale: 0/10 Pain location: BLE- generalized, knees Pain description: discomfort, heavy, tired, full, sore Aggravating factors: standing, walking, extended dependent sitting Relieving factors: elevation  PRECAUTIONS: Other: LYMPHEDEMA PRECAUTIONS: HYPOTHYROID, DIABETES SKIN PRECAUTIONS  WEIGHT BEARING RESTRICTIONS: No  FALLS:  Has patient fallen since last visit? 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  BLE COMPARATIVE LIMB  VOLUMETRICS:  INTAKE: 11/01/22  LANDMARK RIGHT    R LEG (A-D) 5947.2 ml  R THIGH (E-G) 9750.0 ml  R FULL LIMB (A-G) 15697.2 ml  Limb Volume differential (LVD)  %  Volume change since initial %  Volume change overall V  (Blank rows = not tested)  LANDMARK LEFT   L LEG (A-D) 6011.6 ml  L THIGH (E-G) 9602.5 ml  L FULL LIMB (A-G) 15614.1 ml  Limb Volume differential (LVD)  LEG LVD = 1.8%, L>R % THIGH LVD = 1.5%, R>L FULL LIMB LVD = 0.53%, R>L  Volume change since initial %  Volume change overall %  (Blank rows = not tested)   9th VISIT 11/24/22  LANDMARK LEFT   L LEG (A-D) 5918.6 ml  L THIGH (E-G) 9265.5 ml  L FULL LIMB (A-G) 15184.1  ml  Limb Volume differential (LVD)  LEG volume = Decreased 1.5%;  THIGH volume decreased 3.5 %, AND  L FULL LIMB VOLUME dec BY 2.8 % SINCE 11/01/22  Volume change since initial %  Volume change overall %    TODAY'S TREATMENT:     MLD to LLE as established. Simultaneous skin care to limit infection. 2. LLE multilevel, knee length gradient compression  bandaging 3.  PATIENT EDUCATION: Reviewed progress towards goals to date. Continued Pt/ CG edu for lymphedema self care home program throughout session. Topics include outcome of comparative limb volumetrics- starting limb volume differentials (LVDs), technology and gradient techniques used for short stretch, multilayer compression wrapping, simple self-MLD, therapeutic lymphatic pumping exercises, skin/nail care, LE precautions,. compression garment recommendations and specifications, wear and care schedule and compression garment donning / doffing w assistive devices. Discussed progress towards all OT goals since commencing CDT. All questions answered to the Pt's satisfaction. Good return.  Lymphedema management has a high burden of care, especially for Patients who have difficulty, or who are unable to reach their distal legs and feet.  reach their feet and distal legs to apply bandages, compression  garments, to bathe , inspect skin and groom nails. In this case because Pt is unable to perform these activities, daily caregiver assistance with the lymphedema self-care home program  between OT visits is essential for achieving clinical success.  Person educated: Patient and Spouse Education method: Explanation, Demonstration, and Handouts Education comprehension: verbalized understanding, returned demonstration, and needs further education  HOME EXERCISE PROGRAM: Lymphatic Pumping Therex-  1 set of 10, 2 x daily, in order, seated  Daily skin inspection and care with low ph lotion matching skin ph to limit infection risk Simple Self-MLD 1 x daily During Intensive Phase CDT: multilayer compression wraps from foot to groin using gradient techniques, one limb at a time  to ensure safety During Self -Management Phase CDT: Fit with custom daytime compression garments: Elvarex, custom, flat knit, ccl 2 ( 23-32 mmHg) knee highs. Consider pairing with Compreshorts Capri length pantyhose for containment of buttocks, thighs and abdomen.  Fit with BLE knee length Jobst RELAX w/ open toe and zipper to limit ongoing tissue fibrosis formation and facilitate increased lymphatic function during HOS  Custom-made gradient compression garments and HOS devices are medically necessary in this case because they are uniquely sized and shaped to fit the exact dimensions of the affected extremities, and to provide accurate and consistent gradient compression essential for optimally managing this patient's symptoms of chronic, progressive lymphedema. Multiple custom compression garments are needed for optimal hygiene. Custom compression garments should be replaced q 3-6 months When worn consistently for optimal effectiveness.  6. Consider fitting with advanced Flexitouch PLUS sequential pneumatic compression device medically necessary to treat lipo-lymphedema extending above the inguinal lymph nodes, including the hips,  abdomen, buttocks and abdomen. This device follows lymphatic anatomy and provides proximal to distal stimulation above the inguinal lymph nodes to ensure optimal lymphatic return  via the thoracic duct.   ASSESSMENT:Pt tolerated MLD to LLE/LLQ as established w out increased pain. Multilayer knee length wraps applied as established. Fit garments ASAP. Progress continues  at slow but steady pace despite difficult lipo-lymphedema dx. Cont as per POC.  OBJECTIVE IMPAIRMENTS: Abnormal gait, decreased activity tolerance, decreased balance, decreased knowledge of condition, decreased knowledge of use of DME, decreased mobility, difficulty walking, decreased ROM, increased edema, impaired sensation, obesity, pain, and chronic , progressive swelling and pain of buttocks, hips, abdomen and legs .   ACTIVITY LIMITATIONS: carrying, lifting, bending, sitting, standing, squatting, sleeping, stairs, transfers, bed mobility, bathing, dressing, hygiene/grooming, caring for others, and leisure pursuits, social participation, productive/ work activities  PARTICIPATION LIMITATIONS: meal prep, cleaning, laundry, interpersonal relationship, driving, shopping, community activity, occupation, yard work, and church  PERSONAL FACTORS: Age, Past/current experiences, hx of panic attacks and claustrophobia, Time since onset of injury/illness/exacerbation,, motivation, supportive spouse and family are also affecting patient's functional outcome.   REHAB POTENTIAL: Fair Unfortunately the Lipedema does not respond to CDT and is unchanged by this protocol. Fortunately lymphedema  does respond to differing degrees based on the quality and amount of fatty fibrosis present obstructing lymphatics, and patient's tolerance if skin is hypersensitive.   EVALUATION COMPLEXITY: Moderate   GOALS: Goals reviewed with patient? Yes  SHORT TERM GOALS: Target date: 4th OT Rx visit   Pt will demonstrate understanding of lymphedema precautions  and prevention strategies with modified independence using a printed reference to identify at least 5 precautions and discussing how s/he may implement them into daily life to reduce risk of progression with modified assistance ( printed reference). Baseline: Max A Goal status: 12/12/22 PROGRESSING  2.  Pt will be able to apply multilayer, thigh length, compression wraps using gradient techniques with Max caregiver assistance to decrease limb volume, to limit infection risk, and to limit lymphedema progression.  Baseline: Dependent Goal status: 11/10/22 Goal Met  LONG TERM GOALS: Target date: 01/11/23  Given this patient's Intake score of 36.76 % on the Lymphedema Life Impact Scale (LLIS), patient will experience a reduction of at least 5 points in her perceived level of functional impairment resulting from lymphedema to improve functional performance and quality of life (QOL). Baseline: 36.76% Goal status: 12/12/22 PROGRESSING  2.  Given this patient's Intake score of 56/100% on the functional outcomes FOTO tool, patient will experience an increase in function of 3 points to improve basic and instrumental ADLs performance, including lymphedema self-care.  Baseline: 56% Goal status: 12/12/22 PROGRESSING  3.  During Intensive phase CDT Pt will achieve at least 85% compliance with all lymphedema self-care home program components, including  daily skin care, multilayer , gradient compression wraps with daily changes, daily simple self MLD and daily lymphatic pumping therex to achieve optimal clinical outcome and to habituate self care regime for optimal LE self-management over time. Baseline: Dependent Goal status: 01/09/23 GOAL MET  4.  Pt will achieve at least a 10% volume reductions bilaterally below the knees to return limb to more typical size and shape, to limit infection risk and LE progression, to decrease pain, to improve function, and to improve body image and QOL. Baseline: Dependent Goal status:  12/12/22 PROGRESSING  5.  Pt will be able to don and doff appropriate compression garments and devices using assistive devices and extra time within 1 week of issue date for optimal lymphedema self-care. Baseline: Dependent Goal status: 12/12/22 PROGRESSING   PLAN:  PT FREQUENCY: 2x/week  PT DURATION: 12  weeks  other: and PRN  PLANNED INTERVENTIONS: Therapeutic exercises, Therapeutic activity, Patient/Family education, Self Care, DME instructions, Manual lymph drainage, Compression bandaging, Manual therapy, and skin care during MLD, fit with appropriate custom compression garments and devices, fit with appropriate compression device  which  follows lymphatic pathways and anatomic distribution  PLAN FOR NEXT SESSION:  MLD Compression Skin care Pt/ family edu for LE self care   Loel Dubonnet, MS, OTR/L, CLT-LANA 01/17/23 2:20 PM

## 2023-01-11 ENCOUNTER — Ambulatory Visit: Payer: 59 | Admitting: Occupational Therapy

## 2023-01-11 ENCOUNTER — Encounter: Payer: Self-pay | Admitting: Occupational Therapy

## 2023-01-11 DIAGNOSIS — I890 Lymphedema, not elsewhere classified: Secondary | ICD-10-CM | POA: Diagnosis not present

## 2023-01-11 NOTE — Therapy (Signed)
OUTPATIENT OCCUPATIONAL THERAPY TREATMENT NOTE AND PROGRESS REPORT LOWER EXTREMITY LYMPHEDEMA  Patient Name: Mary [name] MRN: [id number] DOB:12-09-1960, 62 y.o., female Today's Date: 01/11/2023  REPORTING PERIOD:  12/12/22 - 01/11/23  END OF SESSION:  OT End of Session - 01/11/23 1523     Visit Number 20    Number of Visits 36    Date for OT Re-Evaluation 01/11/23    OT Start Time 0310    OT Stop Time 0400    OT Time Calculation (min) 50 min    Activity Tolerance Patient tolerated treatment well;No increased pain    Behavior During Therapy WFL for tasks assessed/performed               Past Medical History:  Diagnosis Date   Anemia    low iron at times   Arthritis    Colon polyps    Complication of anesthesia 1991   Pt reports she awoke during plantar fascia surgery and felt "everything"   Heart murmur    " small per patient"    Hyperlipidemia    Hypertension    Hypothyroidism    Lactose intolerance    PAC (premature atrial contraction)    Palpitations    Panic attacks    Pt reports restrictive clothing/areas and dry mouth cause her to have attack.   Peripheral vascular disease (HCC)    "small" clot after thermal ablasion   Pneumonia 2016   "walking pneumonia" 3 times in 2016   Shortness of breath    going upstairs (due to weight). not so much since weight loss   Sleep apnea    after weight loss does not have to use cpap   Vitamin B12 deficiency    Vitamin D deficiency    Past Surgical History:  Procedure Laterality Date   APPENDECTOMY  1985   CESAREAN SECTION  1987   twins   COLONOSCOPY     COLONOSCOPY WITH PROPOFOL N/A 02/27/2018   Procedure: COLONOSCOPY WITH PROPOFOL;  Surgeon: Toledo, Boykin Nearing, MD;  Location: [facility];  Service: Gastroenterology;  Laterality: N/A;   IRRIGATION AND DEBRIDEMENT HEMATOMA Right 11/12/2015   Procedure: IRRIGATION AND DEBRIDEMENT HEMATOMA;  Surgeon: Earline Mayotte, MD;  Location: [facility];  Service: General;   Laterality: Right;   KNEE ARTHROSCOPY WITH MEDIAL MENISECTOMY Left 07/15/2020   Procedure: Left knee arthroscopy with partial medial or lateral menisectomy, possible chondroplasty, possible partial synovectomy;  Surgeon: Lyndle Herrlich, MD;  Location: [facility];  Service: Orthopedics;  Laterality: Left;   LAMINECTOMY  07/10/2013   L4   L5   DR Yetta Barre   LAPAROSCOPIC SALPINGO OOPHERECTOMY Right 04/17/2014   Procedure: LAPAROSCOPIC SALPINGO OOPHORECTOMY RIGHT;  Surgeon: Adolphus Birchwood, MD;  Location: [facility];  Service: Gynecology;  Laterality: Right;   LYSIS OF ADHESION  04/17/2014   Procedure: LYSIS OF ADHESION;  Surgeon: Adolphus Birchwood, MD;  Location: [facility];  Service: Gynecology;;   MAXIMUM ACCESS (MAS)POSTERIOR LUMBAR INTERBODY FUSION (PLIF) 1 LEVEL  07/10/2013   Procedure: FOR MAXIMUM ACCESS (MAS) POSTERIOR LUMBAR INTERBODY FUSION (PLIF) LUMBAR FOUR-FIVE;  Surgeon: Tia Alert, MD;  Location: [facility];  Service: Neurosurgery;;  FOR MAXIMUM ACCESS (MAS) POSTERIOR LUMBAR INTERBODY FUSION (PLIF) LUMBAR FOUR-FIVE   OOPHORECTOMY     LSO   OPEN REDUCTION INTERNAL FIXATION (ORIF) DISTAL RADIAL FRACTURE Left 09/20/2018   Procedure: OPEN REDUCTION INTERNAL FIXATION (ORIF) DISTAL RADIAL FRACTURE, LEFT;  Surgeon: Kennedy Bucker, MD;  Location: [facility];  Service: Orthopedics;  Laterality:  Left;   PLANTAR FASCIA SURGERY  1999   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   VAGINAL HYSTERECTOMY  2001   for bleeding and LSO for cyst   Patient Active Problem List   Diagnosis Date Noted   Reactive hypoglycemia 02/16/2022   Prediabetes 02/16/2022   Chronic venous insufficiency 12/23/2021   Lymphedema 12/13/2021   Panic attacks    Strain of knee 12/24/2019   Vitamin B12 deficiency 10/08/2019   Arthritis 07/16/2019   Edema 07/16/2019   History of abdominal pain 07/16/2019   Low back pain 07/16/2019   Vitamin D deficiency 07/16/2019   Hypothyroidism 02/28/2019   History of gastric bypass 02/28/2019   Obstructive sleep  apnea syndrome 02/28/2019   Insomnia 02/28/2019   Class 3 severe obesity due to excess calories with serious comorbidity and body mass index (BMI) of 40.0 to 44.9 in adult (HCC) 02/28/2019   Elevated BP without diagnosis of hypertension 02/28/2019   Mixed hyperlipidemia 02/28/2019   Hot flashes 02/28/2019   BMI 40.0-44.9, adult (HCC) 12/16/2015   Hematoma of lower extremity 11/12/2015   Essential hypertension 06/20/2014   Pure hypercholesterolemia 06/20/2014   S/P lumbar spinal fusion 07/10/2013   Arthrodesis status 07/10/2013   Palpitations 01/02/2012   Edema of both legs 01/02/2012    PCP: Joni Reining, PA-C  REFERRING PROVIDER: Foster Simpson, DO  REFERRING DIAG: I89.0  THERAPY DIAG:  Lymphedema, not elsewhere classified  Rationale for Evaluation and Treatment: Rehabilitation  ONSET DATE: Insidious onset with progression over time. Greater than 20 years  SUBJECTIVE:                                                                                                                                                                                          SUBJECTIVE STATEMENT:Mary [name] presents to OT visit to address BLE lipo-lymphedema. Pt is accompanied by her supportive spouse, Mary [name]. Pt denies LE-related pain in her legs.  PERTINENT HISTORY and contributing factors include, OA, HTN, Panic attacks 2/2 restrictive clothing, OSA (not using CPAP), s/p hysterectomy 1987, L knee sx, oophorectomy, s/p hysterectomy 2001, Gastric Bypass 2007, s/p plantar fascia sx 1999, obesity, insomnia  PAIN:  Are you having pain? No: NPRS scale: 0/10 Pain location: BLE- generalized, knees Pain description: discomfort, heavy, tired, full, sore Aggravating factors: standing, walking, extended dependent sitting Relieving factors: elevation  PRECAUTIONS: Other: LYMPHEDEMA PRECAUTIONS: HYPOTHYROID, DIABETES SKIN PRECAUTIONS  WEIGHT BEARING RESTRICTIONS: No  FALLS:  Has patient fallen since  last visit? 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  BLE COMPARATIVE LIMB VOLUMETRICS:  INTAKE: 11/01/22  LANDMARK RIGHT    R LEG (A-D) 5947.2 ml  R THIGH (E-G) 9750.0 ml  R FULL LIMB (A-G) 15697.2 ml  Limb Volume differential (LVD)  %  Volume change since initial %  Volume change overall V  (Blank rows = not tested)  LANDMARK LEFT   L LEG (A-D) 6011.6 ml  L THIGH (E-G) 9602.5 ml  L FULL LIMB (A-G) 15614.1 ml  Limb Volume differential (LVD)  LEG LVD = 1.8%, L>R % THIGH LVD = 1.5%, R>L FULL LIMB LVD = 0.53%, R>L  Volume change since initial %  Volume change overall %  (Blank rows = not tested)   9th VISIT 11/24/22  LANDMARK LEFT   L LEG (A-D) 5918.6 ml  L THIGH (E-G) 9265.5 ml  L FULL LIMB (A-G) 15184.1  ml  Limb Volume differential (LVD)  LEG volume = Decreased 1.5%;  THIGH volume decreased 3.5 %, AND  L FULL LIMB VOLUME dec BY 2.8 % SINCE 11/01/22  Volume change since initial %  Volume change overall %    20 th VISIT 01/11/23  LANDMARK LEFT   L LEG (A-D) 5181.2 ml  L THIGH (E-G)  ml  L FULL LIMB (A-G)  ml  Limb Volume differential (LVD)  L LEG (A-D) volume = Decreased 12.4% since 11/24/22. 10% GOAL MET. Did not measure L thigh or full limb today since we have not been bandaging above the knee.   Volume change since initial L LEG (A-D) volume reduction since commencing CDT on 09/02/22 measures 13.8% 10% GOAL MET.    TODAY'S TREATMENT:     MLD to LLE as established. Simultaneous skin care to limit infection. 2. LLE multilevel, knee length gradient compression  bandaging 3.  PATIENT EDUCATION: Reviewed progress towards goals to date. Continued Pt/  CG edu for lymphedema self care home program throughout session. Topics include outcome of comparative limb volumetrics- starting limb volume differentials (LVDs), technology and gradient techniques used for short stretch, multilayer compression wrapping, simple self-MLD, therapeutic lymphatic pumping exercises, skin/nail care, LE precautions,. compression garment recommendations and specifications, wear and care schedule and compression garment donning / doffing w assistive devices. Discussed progress towards all OT goals since commencing CDT. All questions answered to the Pt's satisfaction. Good return.  Lymphedema management has a high burden of care, especially for Patients who have difficulty, or who are unable to reach their distal legs and feet.  reach their feet and distal legs to apply bandages, compression garments, to bathe , inspect skin and groom nails. In this case because Pt is unable to perform these activities, daily caregiver assistance with the lymphedema self-care home program  between OT visits is essential for achieving clinical success.  Person educated: Patient and Spouse Education method: Explanation, Demonstration, and Handouts Education comprehension: verbalized understanding, returned demonstration, and needs further education  HOME EXERCISE PROGRAM: Lymphatic Pumping Therex-  1 set of 10, 2 x daily, in order, seated  Daily skin inspection and care with low ph lotion matching skin ph to limit infection risk Simple Self-MLD 1 x daily During Intensive Phase CDT: multilayer compression wraps from foot to groin using gradient techniques, one limb at a time  to ensure safety During Self -Management Phase CDT: Fit with  custom daytime compression garments: Elvarex, custom, flat knit, ccl 2 ( 23-32 mmHg) knee highs. Consider pairing with Compreshorts Capri length pantyhose for containment of buttocks, thighs and abdomen.  Fit with BLE knee length Jobst RELAX w/ open toe and zipper to  limit ongoing tissue fibrosis formation and facilitate increased lymphatic function during HOS  Custom-made gradient compression garments and HOS devices are medically necessary in this case because they are uniquely sized and shaped to fit the exact dimensions of the affected extremities, and to provide accurate and consistent gradient compression essential for optimally managing this patient's symptoms of chronic, progressive lymphedema. Multiple custom compression garments are needed for optimal hygiene. Custom compression garments should be replaced q 3-6 months When worn consistently for optimal effectiveness.  6. Consider fitting with advanced Flexitouch PLUS sequential pneumatic compression device medically necessary to treat lipo-lymphedema extending above the inguinal lymph nodes, including the hips, abdomen, buttocks and abdomen. This device follows lymphatic anatomy and provides proximal to distal stimulation above the inguinal lymph nodes to ensure optimal lymphatic return via the thoracic duct.   ASSESSMENT: Comparative limb volumetrics taken below the knee only reveal L LEG volume is Decreased by 12.4% since 11/24/22. This value meets the limb volume reduction goal for LLE CDT. We did not measure L thigh or full limb today since we have not been bandaging above the knee. Please review GOALS section for additional details of progress to date. Pt REMAINS FULLY ENGAGED IN ALL ASPECTS OF THERAPY.  He supportive spouse assists with wraps daily, and lipo-lymphedema has responded better to treatment than expected in this case. Cont as per POC.  OBJECTIVE IMPAIRMENTS: Abnormal gait, decreased activity tolerance, decreased balance, decreased knowledge of condition, decreased knowledge of use of DME, decreased mobility, difficulty walking, decreased ROM, increased edema, impaired sensation, obesity, pain, and chronic , progressive swelling and pain of buttocks, hips, abdomen and legs .   ACTIVITY  LIMITATIONS: carrying, lifting, bending, sitting, standing, squatting, sleeping, stairs, transfers, bed mobility, bathing, dressing, hygiene/grooming, caring for others, and leisure pursuits, social participation, productive/ work activities  PARTICIPATION LIMITATIONS: meal prep, cleaning, laundry, interpersonal relationship, driving, shopping, community activity, occupation, yard work, and church  PERSONAL FACTORS: Age, Past/current experiences, hx of panic attacks and claustrophobia, Time since onset of injury/illness/exacerbation,, motivation, supportive spouse and family are also affecting patient's functional outcome.   REHAB POTENTIAL: Fair Unfortunately the Lipedema does not respond to CDT and is unchanged by this protocol. Fortunately lymphedema  does respond to differing degrees based on the quality and amount of fatty fibrosis present obstructing lymphatics, and patient's tolerance if skin is hypersensitive.   EVALUATION COMPLEXITY: Moderate   GOALS: Goals reviewed with patient? Yes  SHORT TERM GOALS: Target date: 4th OT Rx visit   Pt will demonstrate understanding of lymphedema precautions and prevention strategies with modified independence using a printed reference to identify at least 5 precautions and discussing how s/he may implement them into daily life to reduce risk of progression with modified assistance ( printed reference). Baseline: Max A Goal status: 12/12/22 PROGRESSING 01/11/23 GOAL MET  2.  Pt will be able to apply multilayer, thigh length, compression wraps using gradient techniques with Max caregiver assistance to decrease limb volume, to limit infection risk, and to limit lymphedema progression.  Baseline: Dependent Goal status: 11/10/22 Goal Met  LONG TERM GOALS: Target date: 01/11/23  Given this patient's Intake score of 36.76 % on the Lymphedema Life Impact Scale (LLIS), patient will experience a reduction of at least 5  points in her perceived level of functional  impairment resulting from lymphedema to improve functional performance and quality of life (QOL). Baseline: 36.76% Goal status: 12/12/22 PROGRESSING  2.  Given this patient's Intake score of 56/100% on the functional outcomes FOTO tool, patient will experience an increase in function of 3 points to improve basic and instrumental ADLs performance, including lymphedema self-care.  Baseline: 56% Goal status: 12/12/22 PROGRESSING  3.  During Intensive phase CDT Pt will achieve at least 85% compliance with all lymphedema self-care home program components, including  daily skin care, multilayer , gradient compression wraps with daily changes, daily simple self MLD and daily lymphatic pumping therex to achieve optimal clinical outcome and to habituate self care regime for optimal LE self-management over time. Baseline: Dependent Goal status: 01/09/23 GOAL MET  4.  Pt will achieve at least a 10% volume reductions bilaterally below the knees to return limb to more typical size and shape, to limit infection risk and LE progression, to decrease pain, to improve function, and to improve body image and QOL. Baseline: Dependent Goal status: 12/12/22 PROGRESSING. 01/11/23 GOAL MET for L LEG  5.  Pt will be able to don and doff appropriate compression garments and devices using assistive devices and extra time within 1 week of issue date for optimal lymphedema self-care. Baseline: Dependent Goal status: 12/12/22 PROGRESSING   PLAN:  PT FREQUENCY: 2x/week  PT DURATION: 12  weeks other: and PRN  PLANNED INTERVENTIONS: Therapeutic exercises, Therapeutic activity, Patient/Family education, Self Care, DME instructions, Manual lymph drainage, Compression bandaging, Manual therapy, and skin care during MLD, fit with appropriate custom compression garments and devices, fit with appropriate compression device  which  follows lymphatic pathways and anatomic distribution  PLAN FOR NEXT SESSION:  MLD Compression Skin  care Pt/ family edu for LE self care   Loel Dubonnet, MS, OTR/L, CLT-LANA 01/11/23 4:00 PM

## 2023-01-18 ENCOUNTER — Ambulatory Visit: Payer: 59 | Admitting: Occupational Therapy

## 2023-01-18 DIAGNOSIS — I890 Lymphedema, not elsewhere classified: Secondary | ICD-10-CM | POA: Diagnosis not present

## 2023-01-18 NOTE — Therapy (Signed)
OUTPATIENT OCCUPATIONAL THERAPY TREATMENT NOTE LOWER EXTREMITY LYMPHEDEMA  Patient Name: Mary [name] MRN: [id number] DOB:05/11/1961, 62 y.o., female Today's Date: 01/18/2023    END OF SESSION:  OT End of Session - 01/18/23 1516     Visit Number 21    Number of Visits 36    Date for OT Re-Evaluation 01/11/23    OT Start Time 0305    OT Stop Time 0404    OT Time Calculation (min) 59 min    Activity Tolerance Patient tolerated treatment well;No increased pain    Behavior During Therapy WFL for tasks assessed/performed               Past Medical History:  Diagnosis Date   Anemia    low iron at times   Arthritis    Colon polyps    Complication of anesthesia 1991   Pt reports she awoke during plantar fascia surgery and felt "everything"   Heart murmur    " small per patient"    Hyperlipidemia    Hypertension    Hypothyroidism    Lactose intolerance    PAC (premature atrial contraction)    Palpitations    Panic attacks    Pt reports restrictive clothing/areas and dry mouth cause her to have attack.   Peripheral vascular disease (HCC)    "small" clot after thermal ablasion   Pneumonia 2016   "walking pneumonia" 3 times in 2016   Shortness of breath    going upstairs (due to weight). not so much since weight loss   Sleep apnea    after weight loss does not have to use cpap   Vitamin B12 deficiency    Vitamin D deficiency    Past Surgical History:  Procedure Laterality Date   APPENDECTOMY  1985   CESAREAN SECTION  1987   twins   COLONOSCOPY     COLONOSCOPY WITH PROPOFOL N/A 02/27/2018   Procedure: COLONOSCOPY WITH PROPOFOL;  Surgeon: Toledo, Boykin Nearing, MD;  Location: [facility];  Service: Gastroenterology;  Laterality: N/A;   IRRIGATION AND DEBRIDEMENT HEMATOMA Right 11/12/2015   Procedure: IRRIGATION AND DEBRIDEMENT HEMATOMA;  Surgeon: Earline Mayotte, MD;  Location: [facility];  Service: General;  Laterality: Right;   KNEE ARTHROSCOPY WITH MEDIAL  MENISECTOMY Left 07/15/2020   Procedure: Left knee arthroscopy with partial medial or lateral menisectomy, possible chondroplasty, possible partial synovectomy;  Surgeon: Lyndle Herrlich, MD;  Location: [facility];  Service: Orthopedics;  Laterality: Left;   LAMINECTOMY  07/10/2013   L4   L5   DR Yetta Barre   LAPAROSCOPIC SALPINGO OOPHERECTOMY Right 04/17/2014   Procedure: LAPAROSCOPIC SALPINGO OOPHORECTOMY RIGHT;  Surgeon: Adolphus Birchwood, MD;  Location: [facility];  Service: Gynecology;  Laterality: Right;   LYSIS OF ADHESION  04/17/2014   Procedure: LYSIS OF ADHESION;  Surgeon: Adolphus Birchwood, MD;  Location: [facility];  Service: Gynecology;;   MAXIMUM ACCESS (MAS)POSTERIOR LUMBAR INTERBODY FUSION (PLIF) 1 LEVEL  07/10/2013   Procedure: FOR MAXIMUM ACCESS (MAS) POSTERIOR LUMBAR INTERBODY FUSION (PLIF) LUMBAR FOUR-FIVE;  Surgeon: Tia Alert, MD;  Location: [facility];  Service: Neurosurgery;;  FOR MAXIMUM ACCESS (MAS) POSTERIOR LUMBAR INTERBODY FUSION (PLIF) LUMBAR FOUR-FIVE   OOPHORECTOMY     LSO   OPEN REDUCTION INTERNAL FIXATION (ORIF) DISTAL RADIAL FRACTURE Left 09/20/2018   Procedure: OPEN REDUCTION INTERNAL FIXATION (ORIF) DISTAL RADIAL FRACTURE, LEFT;  Surgeon: Kennedy Bucker, MD;  Location: [facility];  Service: Orthopedics;  Laterality: Left;   PLANTAR FASCIA SURGERY  1999  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   VAGINAL HYSTERECTOMY  2001   for bleeding and LSO for cyst   Patient Active Problem List   Diagnosis Date Noted   Reactive hypoglycemia 02/16/2022   Prediabetes 02/16/2022   Chronic venous insufficiency 12/23/2021   Lymphedema 12/13/2021   Panic attacks    Strain of knee 12/24/2019   Vitamin B12 deficiency 10/08/2019   Arthritis 07/16/2019   Edema 07/16/2019   History of abdominal pain 07/16/2019   Low back pain 07/16/2019   Vitamin D deficiency 07/16/2019   Hypothyroidism 02/28/2019   History of gastric bypass 02/28/2019   Obstructive sleep apnea syndrome 02/28/2019   Insomnia 02/28/2019    Class 3 severe obesity due to excess calories with serious comorbidity and body mass index (BMI) of 40.0 to 44.9 in adult (HCC) 02/28/2019   Elevated BP without diagnosis of hypertension 02/28/2019   Mixed hyperlipidemia 02/28/2019   Hot flashes 02/28/2019   BMI 40.0-44.9, adult (HCC) 12/16/2015   Hematoma of lower extremity 11/12/2015   Essential hypertension 06/20/2014   Pure hypercholesterolemia 06/20/2014   S/P lumbar spinal fusion 07/10/2013   Arthrodesis status 07/10/2013   Palpitations 01/02/2012   Edema of both legs 01/02/2012    PCP: Joni Reining, PA-C  REFERRING PROVIDER: Foster Simpson, DO  REFERRING DIAG: I89.0  THERAPY DIAG:  Lymphedema, not elsewhere classified [I89.0]  Rationale for Evaluation and Treatment: Rehabilitation  ONSET DATE: Insidious onset with progression over time. Greater than 20 years  SUBJECTIVE:                                                                                                                                                                                          SUBJECTIVE STATEMENT:Mary [name] presents to OT visit to address BLE lipo-lymphedema. Pt is accompanied by her supportive spouse, [name]. Pt denies LE-related pain in her legs.  PERTINENT HISTORY and contributing factors include, OA, HTN, Panic attacks 2/2 restrictive clothing, OSA (not using CPAP), s/p hysterectomy 1987, L knee sx, oophorectomy, s/p hysterectomy 2001, Gastric Bypass 2007, s/p plantar fascia sx 1999, obesity, insomnia  PAIN:  Are you having pain? No: NPRS scale: 0/10 Pain location: BLE- generalized, knees Pain description: discomfort, heavy, tired, full, sore Aggravating factors: standing, walking, extended dependent sitting Relieving factors: elevation  PRECAUTIONS: Other: LYMPHEDEMA PRECAUTIONS: HYPOTHYROID, DIABETES SKIN PRECAUTIONS  WEIGHT BEARING RESTRICTIONS: No  FALLS:  Has patient fallen since last visit? 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  BLE COMPARATIVE LIMB VOLUMETRICS:  INTAKE: 11/01/22  LANDMARK RIGHT    R LEG (A-D) 5947.2 ml  R THIGH (E-G) 9750.0 ml  R FULL LIMB (A-G) 15697.2 ml  Limb Volume differential (LVD)  %  Volume change since initial %  Volume change overall V  (Blank rows = not tested)  LANDMARK LEFT   L LEG (A-D) 6011.6 ml  L THIGH (E-G) 9602.5 ml  L FULL LIMB (A-G) 15614.1 ml  Limb Volume differential (LVD)  LEG LVD = 1.8%, L>R % THIGH LVD = 1.5%, R>L FULL LIMB LVD = 0.53%, R>L  Volume change since initial %  Volume change overall %  (Blank rows = not tested)   9th VISIT 11/24/22  LANDMARK LEFT   L LEG (A-D) 5918.6 ml  L THIGH (E-G) 9265.5 ml  L FULL LIMB (A-G) 15184.1  ml  Limb Volume differential (LVD)  LEG volume = Decreased 1.5%;  THIGH volume decreased 3.5 %, AND  L FULL LIMB VOLUME dec BY 2.8 % SINCE 11/01/22  Volume change since initial %  Volume change overall %    20 th VISIT 01/11/23  LANDMARK LEFT   L LEG (A-D) 5181.2 ml  L THIGH (E-G)  ml  L FULL LIMB (A-G)  ml  Limb Volume differential (LVD)  L LEG (A-D) volume = Decreased 12.4% since 11/24/22. 10% GOAL MET. Did not measure L thigh or full limb today since we have not been bandaging above the knee.   Volume change since initial L LEG (A-D) volume reduction since commencing CDT on 09/02/22 measures 13.8% 10% GOAL MET.    TODAY'S TREATMENT:     MLD to LLE as established. Simultaneous skin care to limit infection. 2. LLE multilevel, knee length gradient compression  bandaging 3.  PATIENT EDUCATION: Reviewed progress towards goals to date. Continued Pt/ CG edu for lymphedema self care home  program throughout session. Topics include outcome of comparative limb volumetrics- starting limb volume differentials (LVDs), technology and gradient techniques used for short stretch, multilayer compression wrapping, simple self-MLD, therapeutic lymphatic pumping exercises, skin/nail care, LE precautions,. compression garment recommendations and specifications, wear and care schedule and compression garment donning / doffing w assistive devices. Discussed progress towards all OT goals since commencing CDT. All questions answered to the Pt's satisfaction. Good return.  Lymphedema management has a high burden of care, especially for Patients who have difficulty, or who are unable to reach their distal legs and feet.  reach their feet and distal legs to apply bandages, compression garments, to bathe , inspect skin and groom nails. In this case because Pt is unable to perform these activities, daily caregiver assistance with the lymphedema self-care home program  between OT visits is essential for achieving clinical success.  Person educated: Patient and Spouse Education method: Explanation, Demonstration, and Handouts Education comprehension: verbalized understanding, returned demonstration, and needs further education  HOME EXERCISE PROGRAM: Lymphatic Pumping Therex-  1 set of 10, 2 x daily, in order, seated  Daily skin inspection and care with low ph lotion matching skin ph to limit infection risk Simple Self-MLD 1 x daily During Intensive Phase CDT: multilayer compression wraps from foot to groin using gradient techniques, one limb at a time  to ensure safety During Self -Management Phase CDT: Fit with custom daytime compression garments: Elvarex, custom, flat knit, ccl  2 ( 23-32 mmHg) knee highs. Consider pairing with Compreshorts Capri length pantyhose for containment of buttocks, thighs and abdomen.  Fit with BLE knee length Jobst RELAX w/ open toe and zipper to limit ongoing tissue fibrosis  formation and facilitate increased lymphatic function during HOS  Custom-made gradient compression garments and HOS devices are medically necessary in this case because they are uniquely sized and shaped to fit the exact dimensions of the affected extremities, and to provide accurate and consistent gradient compression essential for optimally managing this patient's symptoms of chronic, progressive lymphedema. Multiple custom compression garments are needed for optimal hygiene. Custom compression garments should be replaced q 3-6 months When worn consistently for optimal effectiveness.  6. Consider fitting with advanced Flexitouch PLUS sequential pneumatic compression device medically necessary to treat lipo-lymphedema extending above the inguinal lymph nodes, including the hips, abdomen, buttocks and abdomen. This device follows lymphatic anatomy and provides proximal to distal stimulation above the inguinal lymph nodes to ensure optimal lymphatic return via the thoracic duct.   ASSESSMENT: Pt's custom compression garment still has not arrived from DME vendor. Continued multilayer compression bandaging after MLD to LLE. Pt tolerated all, but is anxious to start on the RLE and transition LLR from Intensive to self-management phase CDT. Cont as per POC.  OBJECTIVE IMPAIRMENTS: Abnormal gait, decreased activity tolerance, decreased balance, decreased knowledge of condition, decreased knowledge of use of DME, decreased mobility, difficulty walking, decreased ROM, increased edema, impaired sensation, obesity, pain, and chronic , progressive swelling and pain of buttocks, hips, abdomen and legs .   ACTIVITY LIMITATIONS: carrying, lifting, bending, sitting, standing, squatting, sleeping, stairs, transfers, bed mobility, bathing, dressing, hygiene/grooming, caring for others, and leisure pursuits, social participation, productive/ work activities  PARTICIPATION LIMITATIONS: meal prep, cleaning, laundry,  interpersonal relationship, driving, shopping, community activity, occupation, yard work, and church  PERSONAL FACTORS: Age, Past/current experiences, hx of panic attacks and claustrophobia, Time since onset of injury/illness/exacerbation,, motivation, supportive spouse and family are also affecting patient's functional outcome.   REHAB POTENTIAL: Fair Unfortunately the Lipedema does not respond to CDT and is unchanged by this protocol. Fortunately lymphedema  does respond to differing degrees based on the quality and amount of fatty fibrosis present obstructing lymphatics, and patient's tolerance if skin is hypersensitive.   EVALUATION COMPLEXITY: Moderate   GOALS: Goals reviewed with patient? Yes  SHORT TERM GOALS: Target date: 4th OT Rx visit   Pt will demonstrate understanding of lymphedema precautions and prevention strategies with modified independence using a printed reference to identify at least 5 precautions and discussing how s/he may implement them into daily life to reduce risk of progression with modified assistance ( printed reference). Baseline: Max A Goal status: 12/12/22 PROGRESSING 01/11/23 GOAL MET  2.  Pt will be able to apply multilayer, thigh length, compression wraps using gradient techniques with Max caregiver assistance to decrease limb volume, to limit infection risk, and to limit lymphedema progression.  Baseline: Dependent Goal status: 11/10/22 Goal Met  LONG TERM GOALS: Target date: 01/11/23  Given this patient's Intake score of 36.76 % on the Lymphedema Life Impact Scale (LLIS), patient will experience a reduction of at least 5 points in her perceived level of functional impairment resulting from lymphedema to improve functional performance and quality of life (QOL). Baseline: 36.76% Goal status: 12/12/22 PROGRESSING  2.  Given this patient's Intake score of 56/100% on the functional outcomes FOTO tool, patient will experience an increase in function of 3 points to  improve basic and instrumental  ADLs performance, including lymphedema self-care.  Baseline: 56% Goal status: 12/12/22 PROGRESSING  3.  During Intensive phase CDT Pt will achieve at least 85% compliance with all lymphedema self-care home program components, including  daily skin care, multilayer , gradient compression wraps with daily changes, daily simple self MLD and daily lymphatic pumping therex to achieve optimal clinical outcome and to habituate self care regime for optimal LE self-management over time. Baseline: Dependent Goal status: 01/09/23 GOAL MET  4.  Pt will achieve at least a 10% volume reductions bilaterally below the knees to return limb to more typical size and shape, to limit infection risk and LE progression, to decrease pain, to improve function, and to improve body image and QOL. Baseline: Dependent Goal status: 12/12/22 PROGRESSING. 01/11/23 GOAL MET for L LEG  5.  Pt will be able to don and doff appropriate compression garments and devices using assistive devices and extra time within 1 week of issue date for optimal lymphedema self-care. Baseline: Dependent Goal status: 12/12/22 PROGRESSING   PLAN:  PT FREQUENCY: 2x/week  PT DURATION: 12  weeks other: and PRN  PLANNED INTERVENTIONS: Therapeutic exercises, Therapeutic activity, Patient/Family education, Self Care, DME instructions, Manual lymph drainage, Compression bandaging, Manual therapy, and skin care during MLD, fit with appropriate custom compression garments and devices, fit with appropriate compression device  which  follows lymphatic pathways and anatomic distribution  PLAN FOR NEXT SESSION:  MLD Compression Skin care Pt/ family edu for LE self care   Loel Dubonnet, MS, OTR/L, CLT-LANA 01/18/23 4:06 PM

## 2023-01-23 ENCOUNTER — Encounter: Payer: Self-pay | Admitting: Occupational Therapy

## 2023-01-23 ENCOUNTER — Ambulatory Visit: Payer: 59 | Admitting: Occupational Therapy

## 2023-01-23 DIAGNOSIS — I890 Lymphedema, not elsewhere classified: Secondary | ICD-10-CM

## 2023-01-24 NOTE — Therapy (Signed)
OUTPATIENT OCCUPATIONAL THERAPY TREATMENT NOTE LOWER EXTREMITY LYMPHEDEMA  Patient Name: Mary [name] MRN: [id number] DOB:[birth year], 62 y.o., female Today's Date: 01/24/2023    END OF SESSION:  OT End of Session - 01/23/23 1507     Visit Number 22    Number of Visits 36    Date for OT Re-Evaluation 04/24/23    OT Start Time 0300    OT Stop Time 0410    OT Time Calculation (min) 70 min    Activity Tolerance Patient tolerated treatment well;No increased pain    Behavior During Therapy WFL for tasks assessed/performed               Past Medical History:  Diagnosis Date   Anemia    low iron at times   Arthritis    Colon polyps    Complication of anesthesia 1991   Pt reports she awoke during plantar fascia surgery and felt "everything"   Heart murmur    " small per patient"    Hyperlipidemia    Hypertension    Hypothyroidism    Lactose intolerance    PAC (premature atrial contraction)    Palpitations    Panic attacks    Pt reports restrictive clothing/areas and dry mouth cause her to have attack.   Peripheral vascular disease (HCC)    "small" clot after thermal ablasion   Pneumonia 2016   "walking pneumonia" 3 times in 2016   Shortness of breath    going upstairs (due to weight). not so much since weight loss   Sleep apnea    after weight loss does not have to use cpap   Vitamin B12 deficiency    Vitamin D deficiency    Past Surgical History:  Procedure Laterality Date   APPENDECTOMY  1985   CESAREAN SECTION  1987   twins   COLONOSCOPY     COLONOSCOPY WITH PROPOFOL N/A 02/27/2018   Procedure: COLONOSCOPY WITH PROPOFOL;  Surgeon: Toledo, Boykin Nearing, MD;  Location: [facility];  Service: Gastroenterology;  Laterality: N/A;   IRRIGATION AND DEBRIDEMENT HEMATOMA Right 11/12/2015   Procedure: IRRIGATION AND DEBRIDEMENT HEMATOMA;  Surgeon: Earline Mayotte, MD;  Location: [facility];  Service: General;  Laterality: Right;   KNEE ARTHROSCOPY WITH MEDIAL  MENISECTOMY Left 07/15/2020   Procedure: Left knee arthroscopy with partial medial or lateral menisectomy, possible chondroplasty, possible partial synovectomy;  Surgeon: Lyndle Herrlich, MD;  Location: [facility];  Service: Orthopedics;  Laterality: Left;   LAMINECTOMY  07/10/2013   L4   L5   DR Yetta Barre   LAPAROSCOPIC SALPINGO OOPHERECTOMY Right 04/17/2014   Procedure: LAPAROSCOPIC SALPINGO OOPHORECTOMY RIGHT;  Surgeon: Adolphus Birchwood, MD;  Location: [facility];  Service: Gynecology;  Laterality: Right;   LYSIS OF ADHESION  04/17/2014   Procedure: LYSIS OF ADHESION;  Surgeon: Adolphus Birchwood, MD;  Location: [facility];  Service: Gynecology;;   MAXIMUM ACCESS (MAS)POSTERIOR LUMBAR INTERBODY FUSION (PLIF) 1 LEVEL  07/10/2013   Procedure: FOR MAXIMUM ACCESS (MAS) POSTERIOR LUMBAR INTERBODY FUSION (PLIF) LUMBAR FOUR-FIVE;  Surgeon: Tia Alert, MD;  Location: [facility];  Service: Neurosurgery;;  FOR MAXIMUM ACCESS (MAS) POSTERIOR LUMBAR INTERBODY FUSION (PLIF) LUMBAR FOUR-FIVE   OOPHORECTOMY     LSO   OPEN REDUCTION INTERNAL FIXATION (ORIF) DISTAL RADIAL FRACTURE Left 09/20/2018   Procedure: OPEN REDUCTION INTERNAL FIXATION (ORIF) DISTAL RADIAL FRACTURE, LEFT;  Surgeon: Kennedy Bucker, MD;  Location: [facility];  Service: Orthopedics;  Laterality: Left;   PLANTAR FASCIA SURGERY  1999  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   VAGINAL HYSTERECTOMY  2001   for bleeding and LSO for cyst   Patient Active Problem List   Diagnosis Date Noted   Reactive hypoglycemia 02/16/2022   Prediabetes 02/16/2022   Chronic venous insufficiency 12/23/2021   Lymphedema 12/13/2021   Panic attacks    Strain of knee 12/24/2019   Vitamin B12 deficiency 10/08/2019   Arthritis 07/16/2019   Edema 07/16/2019   History of abdominal pain 07/16/2019   Low back pain 07/16/2019   Vitamin D deficiency 07/16/2019   Hypothyroidism 02/28/2019   History of gastric bypass 02/28/2019   Obstructive sleep apnea syndrome 02/28/2019   Insomnia 02/28/2019    Class 3 severe obesity due to excess calories with serious comorbidity and body mass index (BMI) of 40.0 to 44.9 in adult (HCC) 02/28/2019   Elevated BP without diagnosis of hypertension 02/28/2019   Mixed hyperlipidemia 02/28/2019   Hot flashes 02/28/2019   BMI 40.0-44.9, adult (HCC) 12/16/2015   Hematoma of lower extremity 11/12/2015   Essential hypertension 06/20/2014   Pure hypercholesterolemia 06/20/2014   S/P lumbar spinal fusion 07/10/2013   Arthrodesis status 07/10/2013   Palpitations 01/02/2012   Edema of both legs 01/02/2012    PCP: Joni Reining, PA-C  REFERRING PROVIDER: Foster Simpson, DO  REFERRING DIAG: I89.0  THERAPY DIAG:  Lymphedema, not elsewhere classified [I89.0]  Rationale for Evaluation and Treatment: Rehabilitation  ONSET DATE: Insidious onset with progression over time. Greater than 20 years  SUBJECTIVE:                                                                                                                                                                                          SUBJECTIVE STATEMENT:[name] presents to OT visit to address BLE lipo-lymphedema. Pt is accompanied by her supportive spouse, [name]. Pt denies LE-related pain in her legs. Pt's initial LLE custom daytime compression garment and HOS device are here for fitting.  PERTINENT HISTORY and contributing factors include, OA, HTN, Panic attacks 2/2 restrictive clothing, OSA (not using CPAP), s/p hysterectomy 1987, L knee sx, oophorectomy, s/p hysterectomy 2001, Gastric Bypass 2007, s/p plantar fascia sx 1999, obesity, insomnia  PAIN:  Are you having pain? No: NPRS scale: 0/10 Pain location: BLE- generalized, knees Pain description: discomfort, heavy, tired, full, sore Aggravating factors: standing, walking, extended dependent sitting Relieving factors: elevation  PRECAUTIONS: Other: LYMPHEDEMA PRECAUTIONS: HYPOTHYROID, DIABETES SKIN PRECAUTIONS  WEIGHT BEARING  RESTRICTIONS: No  FALLS:  Has patient fallen since last visit? 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  BLE COMPARATIVE LIMB VOLUMETRICS:  INTAKE: 11/01/22  LANDMARK RIGHT    R LEG (A-D) 5947.2 ml  R THIGH (E-G) 9750.0 ml  R FULL LIMB (A-G) 15697.2 ml  Limb Volume differential (LVD)  %  Volume change since initial %  Volume change overall V  (Blank rows = not tested)  LANDMARK LEFT   L LEG (A-D) 6011.6 ml  L THIGH (E-G) 9602.5 ml  L FULL LIMB (A-G) 15614.1 ml  Limb Volume differential (LVD)  LEG LVD = 1.8%, L>R % THIGH LVD = 1.5%, R>L FULL LIMB LVD = 0.53%, R>L  Volume change since initial %  Volume change overall %  (Blank rows = not tested)   9th VISIT 11/24/22  LANDMARK LEFT   L LEG (A-D) 5918.6 ml  L THIGH (E-G) 9265.5 ml  L FULL LIMB (A-G) 15184.1  ml  Limb Volume differential (LVD)  LEG volume = Decreased 1.5%;  THIGH volume decreased 3.5 %, AND  L FULL LIMB VOLUME dec BY 2.8 % SINCE 11/01/22  Volume change since initial %  Volume change overall %    20 th VISIT 01/11/23  LANDMARK LEFT   L LEG (A-D) 5181.2 ml  L THIGH (E-G)  ml  L FULL LIMB (A-G)  ml  Limb Volume differential (LVD)  L LEG (A-D) volume = Decreased 12.4% since 11/24/22. 10% GOAL MET. Did not measure L thigh or full limb today since we have not been bandaging above the knee.   Volume change since initial L LEG (A-D) volume reduction since commencing CDT on 09/02/22 measures 13.8% 10% GOAL MET.    TODAY'S TREATMENT:     MLD to RLE as established. Simultaneous skin care to limit infection. 2. RLE multilayer , knee length gradient compression  bandaging  PATIENT EDUCATION:  Skilled OT instruction in LE self-care, including custom compression garments, or alternatives, wear and care regimes, garment positioning, contraindications. Reviewed donning and doffing garments using assistive devices .  Lymphedema management has a high burden of care, especially for Patients who have difficulty, or who are unable to reach their distal legs and feet.  reach their feet and distal legs to apply bandages, compression garments, to bathe , inspect skin and groom nails. In this case because Pt is unable to perform these activities, daily caregiver assistance with the lymphedema self-care home program  between OT visits is essential for achieving clinical success.  Person educated: Patient and Spouse Education method: Explanation, Demonstration, and Handouts Education comprehension: verbalized understanding, returned demonstration, and needs further education  HOME EXERCISE PROGRAM: Lymphatic Pumping Therex-  1 set of 10, 2 x daily, in order, seated  Daily skin inspection and care with low ph lotion matching skin ph to limit infection risk Simple Self-MLD 1 x daily During Intensive Phase CDT: multilayer compression wraps from foot to groin using gradient techniques, one limb at a time  to ensure safety During Self -Management Phase CDT: Fit with custom daytime compression garments: Elvarex, custom, flat knit, ccl 2 ( 23-32 mmHg) knee highs. Consider pairing with Compreshorts Capri length pantyhose for containment of buttocks, thighs and abdomen.  Fit with BLE knee length Jobst RELAX w/ open toe and zipper to limit ongoing tissue fibrosis formation and facilitate increased lymphatic function during HOS  Custom-made gradient compression garments and HOS devices are medically necessary in this case because they are uniquely sized and shaped to fit the exact dimensions of the affected extremities, and to provide accurate and consistent gradient compression essential for optimally managing  this patient's symptoms of chronic, progressive lymphedema. Multiple custom compression garments are needed for optimal hygiene. Custom compression garments should be replaced q 3-6 months When worn consistently for optimal effectiveness.  6. Consider fitting with advanced Flexitouch PLUS sequential pneumatic compression device medically necessary to treat lipo-lymphedema extending above the inguinal lymph nodes, including the hips, abdomen, buttocks and abdomen. This device follows lymphatic anatomy and provides proximal to distal stimulation above the inguinal lymph nodes to ensure optimal lymphatic return via the thoracic duct.   ASSESSMENT: Completed assessment of initial custom flat knit LLE knee high compression garment and Jobst RELAX HOS compression device. Garment and device appear to fit correctly. After skilled teaching Pt able to don   garment and device using friction gloves and extra time (modified independent ). Pt having some difficulty tolerating garment  evidenced by increased anxiety after donning and wearing the 2nd time in the clinic. Pt instructed to remove garment PRN and work on tolerance building by extending wear time gradually. Commenced CDT to RLE with MLD and knee length compression wrapping. Cont as per POC.   OBJECTIVE IMPAIRMENTS: Abnormal gait, decreased activity tolerance, decreased balance, decreased knowledge of condition, decreased knowledge of use of DME, decreased mobility, difficulty walking, decreased ROM, increased edema, impaired sensation, obesity, pain, and chronic , progressive swelling and pain of buttocks, hips, abdomen and legs .   ACTIVITY LIMITATIONS: carrying, lifting, bending, sitting, standing, squatting, sleeping, stairs, transfers, bed mobility, bathing, dressing, hygiene/grooming, caring for others, and leisure pursuits, social participation, productive/ work activities  PARTICIPATION LIMITATIONS: meal prep, cleaning, laundry, interpersonal  relationship, driving, shopping, community activity, occupation, yard work, and church  PERSONAL FACTORS: Age, Past/current experiences, hx of panic attacks and claustrophobia, Time since onset of injury/illness/exacerbation,, motivation, supportive spouse and family are also affecting patient's functional outcome.   REHAB POTENTIAL: Fair Unfortunately the Lipedema does not respond to CDT and is unchanged by this protocol. Fortunately lymphedema  does respond to differing degrees based on the quality and amount of fatty fibrosis present obstructing lymphatics, and patient's tolerance if skin is hypersensitive.   EVALUATION COMPLEXITY: Moderate   GOALS: Goals reviewed with patient? Yes  SHORT TERM GOALS: Target date: 4th OT Rx visit   Pt will demonstrate understanding of lymphedema precautions and prevention strategies with modified independence using a printed reference to identify at least 5 precautions and discussing how s/he may implement them into daily life to reduce risk of progression with modified assistance ( printed reference). Baseline: Max A Goal status: 12/12/22 PROGRESSING 01/11/23 GOAL MET  2.  Pt will be able to apply multilayer, thigh length, compression wraps using gradient techniques with Max caregiver assistance to decrease limb volume, to limit infection risk, and to limit lymphedema progression.  Baseline: Dependent Goal status: 11/10/22 Goal Met  LONG TERM GOALS: Target date: 01/11/23  Given this patient's Intake score of 36.76 % on the Lymphedema Life Impact Scale (LLIS), patient will experience a reduction of at least 5 points in her perceived level of functional impairment resulting from lymphedema to improve functional performance and quality of life (QOL). Baseline: 36.76% Goal status: 12/12/22 PROGRESSING  2.  Given this patient's Intake score of 56/100% on the functional outcomes FOTO tool, patient will experience an increase in function  of 3 points to improve basic  and instrumental ADLs performance, including lymphedema self-care.  Baseline: 56% Goal status: 12/12/22 PROGRESSING  3.  During Intensive phase CDT Pt will achieve at least 85% compliance with all lymphedema self-care home program components, including  daily skin care, multilayer , gradient compression wraps with daily changes, daily simple self MLD and daily lymphatic pumping therex to achieve optimal clinical outcome and to habituate self care regime for optimal LE self-management over time. Baseline: Dependent Goal status: 01/09/23 GOAL MET  4.  Pt will achieve at least a 10% volume reductions bilaterally below the knees to return limb to more typical size and shape, to limit infection risk and LE progression, to decrease pain, to improve function, and to improve body image and QOL. Baseline: Dependent Goal status: 12/12/22 PROGRESSING. 01/11/23 GOAL MET for L LEG  5.  Pt will be able to don and doff appropriate compression garments and devices using assistive devices and extra time within 1 week of issue date for optimal lymphedema self-care. Baseline: Dependent Goal status: 12/12/22 PROGRESSING   PLAN:  PT FREQUENCY: 2x/week  PT DURATION: 12  weeks other: and PRN  PLANNED INTERVENTIONS: Therapeutic exercises, Therapeutic activity, Patient/Family education, Self Care, DME instructions, Manual lymph drainage, Compression bandaging, Manual therapy, and skin care during MLD, fit with appropriate custom compression garments and devices, fit with appropriate compression device  which  follows lymphatic pathways and anatomic distribution  PLAN FOR NEXT SESSION:  MLD Compression Skin care Pt/ family edu for LE self care   Loel Dubonnet, MS, OTR/L, CLT-LANA 01/24/23 9:40 AM

## 2023-01-25 ENCOUNTER — Ambulatory Visit: Payer: 59 | Admitting: Occupational Therapy

## 2023-01-25 DIAGNOSIS — I890 Lymphedema, not elsewhere classified: Secondary | ICD-10-CM | POA: Diagnosis not present

## 2023-01-26 NOTE — Therapy (Signed)
OUTPATIENT OCCUPATIONAL THERAPY TREATMENT NOTE LOWER EXTREMITY LYMPHEDEMA  Patient Name: Mary [name] MRN: [id number] DOB:15-Nov-1960, 62 y.o., female Today's Date: 01/26/2023    END OF SESSION:  OT End of Session - 01/25/23 0753     Visit Number 23    Number of Visits 36    Date for OT Re-Evaluation 04/24/23    OT Start Time 0300    OT Stop Time 0405    OT Time Calculation (min) 65 min    Activity Tolerance Patient tolerated treatment well;No increased pain    Behavior During Therapy WFL for tasks assessed/performed               Past Medical History:  Diagnosis Date   Anemia    low iron at times   Arthritis    Colon polyps    Complication of anesthesia 1991   Pt reports she awoke during plantar fascia surgery and felt "everything"   Heart murmur    " small per patient"    Hyperlipidemia    Hypertension    Hypothyroidism    Lactose intolerance    PAC (premature atrial contraction)    Palpitations    Panic attacks    Pt reports restrictive clothing/areas and dry mouth cause her to have attack.   Peripheral vascular disease (HCC)    "small" clot after thermal ablasion   Pneumonia 2016   "walking pneumonia" 3 times in 2016   Shortness of breath    going upstairs (due to weight). not so much since weight loss   Sleep apnea    after weight loss does not have to use cpap   Vitamin B12 deficiency    Vitamin D deficiency    Past Surgical History:  Procedure Laterality Date   APPENDECTOMY  1985   CESAREAN SECTION  1987   twins   COLONOSCOPY     COLONOSCOPY WITH PROPOFOL N/A 02/27/2018   Procedure: COLONOSCOPY WITH PROPOFOL;  Surgeon: Toledo, Boykin Nearing, MD;  Location: [facility];  Service: Gastroenterology;  Laterality: N/A;   IRRIGATION AND DEBRIDEMENT HEMATOMA Right 11/12/2015   Procedure: IRRIGATION AND DEBRIDEMENT HEMATOMA;  Surgeon: Earline Mayotte, MD;  Location: [facility];  Service: General;  Laterality: Right;   KNEE ARTHROSCOPY WITH MEDIAL  MENISECTOMY Left 07/15/2020   Procedure: Left knee arthroscopy with partial medial or lateral menisectomy, possible chondroplasty, possible partial synovectomy;  Surgeon: Lyndle Herrlich, MD;  Location: [facility];  Service: Orthopedics;  Laterality: Left;   LAMINECTOMY  07/10/2013   L4   L5   DR Yetta Barre   LAPAROSCOPIC SALPINGO OOPHERECTOMY Right 04/17/2014   Procedure: LAPAROSCOPIC SALPINGO OOPHORECTOMY RIGHT;  Surgeon: Adolphus Birchwood, MD;  Location: [facility];  Service: Gynecology;  Laterality: Right;   LYSIS OF ADHESION  04/17/2014   Procedure: LYSIS OF ADHESION;  Surgeon: Adolphus Birchwood, MD;  Location: [facility];  Service: Gynecology;;   MAXIMUM ACCESS (MAS)POSTERIOR LUMBAR INTERBODY FUSION (PLIF) 1 LEVEL  07/10/2013   Procedure: FOR MAXIMUM ACCESS (MAS) POSTERIOR LUMBAR INTERBODY FUSION (PLIF) LUMBAR FOUR-FIVE;  Surgeon: Tia Alert, MD;  Location: [facility];  Service: Neurosurgery;;  FOR MAXIMUM ACCESS (MAS) POSTERIOR LUMBAR INTERBODY FUSION (PLIF) LUMBAR FOUR-FIVE   OOPHORECTOMY     LSO   OPEN REDUCTION INTERNAL FIXATION (ORIF) DISTAL RADIAL FRACTURE Left 09/20/2018   Procedure: OPEN REDUCTION INTERNAL FIXATION (ORIF) DISTAL RADIAL FRACTURE, LEFT;  Surgeon: Kennedy Bucker, MD;  Location: [facility];  Service: Orthopedics;  Laterality: Left;   PLANTAR FASCIA SURGERY  1999  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   VAGINAL HYSTERECTOMY  2001   for bleeding and LSO for cyst   Patient Active Problem List   Diagnosis Date Noted   Reactive hypoglycemia 02/16/2022   Prediabetes 02/16/2022   Chronic venous insufficiency 12/23/2021   Lymphedema 12/13/2021   Panic attacks    Strain of knee 12/24/2019   Vitamin B12 deficiency 10/08/2019   Arthritis 07/16/2019   Edema 07/16/2019   History of abdominal pain 07/16/2019   Low back pain 07/16/2019   Vitamin D deficiency 07/16/2019   Hypothyroidism 02/28/2019   History of gastric bypass 02/28/2019   Obstructive sleep apnea syndrome 02/28/2019   Insomnia 02/28/2019    Class 3 severe obesity due to excess calories with serious comorbidity and body mass index (BMI) of 40.0 to 44.9 in adult (HCC) 02/28/2019   Elevated BP without diagnosis of hypertension 02/28/2019   Mixed hyperlipidemia 02/28/2019   Hot flashes 02/28/2019   BMI 40.0-44.9, adult (HCC) 12/16/2015   Hematoma of lower extremity 11/12/2015   Essential hypertension 06/20/2014   Pure hypercholesterolemia 06/20/2014   S/P lumbar spinal fusion 07/10/2013   Arthrodesis status 07/10/2013   Palpitations 01/02/2012   Edema of both legs 01/02/2012    PCP: Joni Reining, PA-C  REFERRING PROVIDER: Foster Simpson, DO  REFERRING DIAG: I89.0  THERAPY DIAG:  Lymphedema, not elsewhere classified [I89.0]  Rationale for Evaluation and Treatment: Rehabilitation  ONSET DATE: Insidious onset with progression over time. Greater than 20 years  SUBJECTIVE:                                                                                                                                                                                          SUBJECTIVE STATEMENT:Mary [name] presents to OT visit to address BLE lipo-lymphedema. Pt is accompanied by her supportive spouse, [name]. Pt denies LE-related pain in her legs. Pt reports she is tolerating new LLE compression knee high better after stretching out the ankle area over night. Pt wears her new garment to clinic on the L leg, and is wrapped on the R.  PERTINENT HISTORY and contributing factors include, OA, HTN, Panic attacks 2/2 restrictive clothing, OSA (not using CPAP), s/p hysterectomy 1987, L knee sx, oophorectomy, s/p hysterectomy 2001, Gastric Bypass 2007, s/p plantar fascia sx 1999, obesity, insomnia  PAIN:  Are you having pain? No: NPRS scale: 0/10 Pain location: BLE- generalized, knees Pain description: discomfort, heavy, tired, full, sore Aggravating factors: standing, walking, extended dependent sitting Relieving factors:  elevation  PRECAUTIONS: Other: LYMPHEDEMA PRECAUTIONS: HYPOTHYROID, DIABETES SKIN PRECAUTIONS  WEIGHT BEARING RESTRICTIONS: No  FALLS:  Has patient fallen since last visit?  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  BLE COMPARATIVE LIMB VOLUMETRICS:  INTAKE: 11/01/22  LANDMARK RIGHT    R LEG (A-D) 5947.2 ml  R THIGH (E-G) 9750.0 ml  R FULL LIMB (A-G) 15697.2 ml  Limb Volume differential (LVD)  %  Volume change since initial %  Volume change overall V  (Blank rows = not tested)  LANDMARK LEFT   L LEG (A-D) 6011.6 ml  L THIGH (E-G) 9602.5 ml  L FULL LIMB (A-G) 15614.1 ml  Limb Volume differential (LVD)  LEG LVD = 1.8%, L>R % THIGH LVD = 1.5%, R>L FULL LIMB LVD = 0.53%, R>L  Volume change since initial %  Volume change overall %  (Blank rows = not tested)   9th VISIT 11/24/22  LANDMARK LEFT   L LEG (A-D) 5918.6 ml  L THIGH (E-G) 9265.5 ml  L FULL LIMB (A-G) 15184.1  ml  Limb Volume differential (LVD)  LEG volume = Decreased 1.5%;  THIGH volume decreased 3.5 %, AND  L FULL LIMB VOLUME dec BY 2.8 % SINCE 11/01/22  Volume change since initial %  Volume change overall %    20 th VISIT 01/11/23  LANDMARK LEFT   L LEG (A-D) 5181.2 ml  L THIGH (E-G)  ml  L FULL LIMB (A-G)  ml  Limb Volume differential (LVD)  L LEG (A-D) volume = Decreased 12.4% since 11/24/22. 10% GOAL MET. Did not measure L thigh or full limb today since we have not been bandaging above the knee.   Volume change since initial L LEG (A-D) volume reduction since commencing CDT on 09/02/22 measures 13.8% 10% GOAL MET.    TODAY'S TREATMENT:     MLD to RLE/RLQ as established. Simultaneous  skin care to limit infection. 2. RLE multilayer , knee length gradient compression  bandaging  PATIENT EDUCATION: Skilled OT instruction in LE self-care, including custom compression garments, or alternatives, wear and care regimes, garment positioning, contraindications. Reviewed donning and doffing garments using assistive devices .  Lymphedema management has a high burden of care, especially for Patients who have difficulty, or who are unable to reach their distal legs and feet.  reach their feet and distal legs to apply bandages, compression garments, to bathe , inspect skin and groom nails. In this case because Pt is unable to perform these activities, daily caregiver assistance with the lymphedema self-care home program  between OT visits is essential for achieving clinical success.  Person educated: Patient and Spouse Education method: Explanation, Demonstration, and Handouts Education comprehension: verbalized understanding, returned demonstration, and needs further education  HOME EXERCISE PROGRAM: Lymphatic Pumping Therex-  1 set of 10, 2 x daily, in order, seated  Daily skin inspection and care with low ph lotion matching skin ph to limit infection risk Simple Self-MLD 1 x daily During Intensive Phase CDT: multilayer compression wraps from foot to groin using gradient techniques, one limb at a time  to ensure safety During Self -Management Phase CDT: Fit with custom daytime compression garments: Elvarex, custom, flat knit, ccl 2 ( 23-32 mmHg) knee highs. Consider pairing with Compreshorts Capri length pantyhose for containment of buttocks, thighs and abdomen.  Fit with  BLE knee length Jobst RELAX w/ open toe and zipper to limit ongoing tissue fibrosis formation and facilitate increased lymphatic function during HOS  Custom-made gradient compression garments and HOS devices are medically necessary in this case because they are uniquely sized and shaped to fit the exact dimensions of the  affected extremities, and to provide accurate and consistent gradient compression essential for optimally managing this patient's symptoms of chronic, progressive lymphedema. Multiple custom compression garments are needed for optimal hygiene. Custom compression garments should be replaced q 3-6 months When worn consistently for optimal effectiveness.  6. Consider fitting with advanced Flexitouch PLUS sequential pneumatic compression device medically necessary to treat lipo-lymphedema extending above the inguinal lymph nodes, including the hips, abdomen, buttocks and abdomen. This device follows lymphatic anatomy and provides proximal to distal stimulation above the inguinal lymph nodes to ensure optimal lymphatic return via the thoracic duct.   ASSESSMENT: Pt building tolerance to new LLE  compression garment. She was able to tolerate it all day today with minor difficulty. She Ok 'd ordering remaining L knee high garment based on current measurements. Continued with RLE MLD, skin care and compression wrapping today. Slight reduction in edema and tissue density observed at distal leg and ankle. Cont as per POC.   OBJECTIVE IMPAIRMENTS: Abnormal gait, decreased activity tolerance, decreased balance, decreased knowledge of condition, decreased knowledge of use of DME, decreased mobility, difficulty walking, decreased ROM, increased edema, impaired sensation, obesity, pain, and chronic , progressive swelling and pain of buttocks, hips, abdomen and legs .   ACTIVITY LIMITATIONS: carrying, lifting, bending, sitting, standing, squatting, sleeping, stairs, transfers, bed mobility, bathing, dressing, hygiene/grooming, caring for others, and leisure pursuits, social participation, productive/ work activities  PARTICIPATION LIMITATIONS: meal prep, cleaning, laundry, interpersonal relationship, driving, shopping, community activity, occupation, yard work, and church  PERSONAL FACTORS: Age, Past/current  experiences, hx of panic attacks and claustrophobia, Time since onset of injury/illness/exacerbation,, motivation, supportive spouse and family are also affecting patient's functional outcome.   REHAB POTENTIAL: Fair Unfortunately the Lipedema does not respond to CDT and is unchanged by this protocol. Fortunately lymphedema  does respond to differing degrees based on the quality and amount of fatty fibrosis present obstructing lymphatics, and patient's tolerance if skin is hypersensitive.   EVALUATION COMPLEXITY: Moderate   GOALS: Goals reviewed with patient? Yes  SHORT TERM GOALS: Target date: 4th OT Rx visit   Pt will demonstrate understanding of lymphedema precautions and prevention strategies with modified independence using a printed reference to identify at least 5 precautions and discussing how s/he may implement them into daily life to reduce risk of progression with modified assistance ( printed reference). Baseline: Max A Goal status: 12/12/22 PROGRESSING 01/11/23 GOAL MET  2.  Pt will be able to apply multilayer, thigh length, compression wraps using gradient techniques with Max caregiver assistance to decrease limb volume, to limit infection risk, and to limit lymphedema progression.  Baseline: Dependent Goal status: 11/10/22 Goal Met  LONG TERM GOALS: Target date: 01/11/23  Given this patient's Intake score of 36.76 % on the Lymphedema Life Impact Scale (LLIS), patient will experience a reduction of at least 5 points in her perceived level of functional impairment resulting from lymphedema to improve functional performance and quality of life (QOL). Baseline: 36.76% Goal status: 12/12/22 PROGRESSING  2.  Given this patient's Intake score of 56/100% on the functional outcomes FOTO tool, patient will experience an increase in function of 3 points to improve basic and instrumental ADLs performance, including lymphedema self-care.  Baseline: 56% Goal status: 12/12/22 PROGRESSING  3.   During Intensive phase CDT Pt will achieve at least 85% compliance with all lymphedema self-care home program components, including  daily skin care, multilayer , gradient compression wraps with daily changes, daily simple self MLD and daily lymphatic pumping therex to achieve optimal clinical outcome and to habituate self care regime for optimal LE self-management over time. Baseline: Dependent Goal status: 01/09/23 GOAL MET  4.  Pt will achieve at least a 10% volume reductions bilaterally below the knees to return limb to more typical size and shape, to limit infection risk and LE progression, to decrease pain, to improve function, and to improve body image and QOL. Baseline: Dependent Goal status: 12/12/22 PROGRESSING. 01/11/23 GOAL MET for L LEG  5.  Pt will be able to don and doff appropriate compression garments and devices using assistive devices and extra time within 1 week of issue date for optimal lymphedema self-care. Baseline: Dependent Goal status: 01/25/23 GOAL MET   PLAN:  PT FREQUENCY: 2x/week  PT DURATION: 12  weeks other: and PRN  PLANNED INTERVENTIONS: Therapeutic exercises, Therapeutic activity, Patient/Family education, Self Care, DME instructions, Manual lymph drainage, Compression bandaging, Manual therapy, and skin care during MLD, fit with appropriate custom compression garments and devices, fit with appropriate compression device  which  follows lymphatic pathways and anatomic distribution  PLAN FOR NEXT SESSION:  MLD Compression Skin care Pt/ family edu for LE self care   Loel Dubonnet, MS, OTR/L, CLT-LANA 01/26/23 7:54 AM

## 2023-01-30 ENCOUNTER — Encounter: Payer: Self-pay | Admitting: Occupational Therapy

## 2023-01-30 ENCOUNTER — Ambulatory Visit: Payer: 59 | Admitting: Occupational Therapy

## 2023-01-30 DIAGNOSIS — I890 Lymphedema, not elsewhere classified: Secondary | ICD-10-CM | POA: Diagnosis not present

## 2023-01-30 NOTE — Therapy (Signed)
OUTPATIENT OCCUPATIONAL THERAPY TREATMENT NOTE LOWER EXTREMITY LYMPHEDEMA  Patient Name: Mary [name] MRN: [id number] DOB:Oct 13, 1960, 62 y.o., female Today's Date: 01/30/2023    END OF SESSION:  OT End of Session - 01/30/23 1512     Visit Number 24    Number of Visits 36    Date for OT Re-Evaluation 04/24/23    OT Start Time 0305    OT Stop Time 0405    OT Time Calculation (min) 60 min    Activity Tolerance Patient tolerated treatment well;No increased pain    Behavior During Therapy WFL for tasks assessed/performed               Past Medical History:  Diagnosis Date   Anemia    low iron at times   Arthritis    Colon polyps    Complication of anesthesia 1991   Pt reports she awoke during plantar fascia surgery and felt "everything"   Heart murmur    " small per patient"    Hyperlipidemia    Hypertension    Hypothyroidism    Lactose intolerance    PAC (premature atrial contraction)    Palpitations    Panic attacks    Pt reports restrictive clothing/areas and dry mouth cause her to have attack.   Peripheral vascular disease (HCC)    "small" clot after thermal ablasion   Pneumonia 2016   "walking pneumonia" 3 times in 2016   Shortness of breath    going upstairs (due to weight). not so much since weight loss   Sleep apnea    after weight loss does not have to use cpap   Vitamin B12 deficiency    Vitamin D deficiency    Past Surgical History:  Procedure Laterality Date   APPENDECTOMY  1985   CESAREAN SECTION  1987   twins   COLONOSCOPY     COLONOSCOPY WITH PROPOFOL N/A 02/27/2018   Procedure: COLONOSCOPY WITH PROPOFOL;  Surgeon: Toledo, Boykin Nearing, MD;  Location: [facility];  Service: Gastroenterology;  Laterality: N/A;   IRRIGATION AND DEBRIDEMENT HEMATOMA Right 11/12/2015   Procedure: IRRIGATION AND DEBRIDEMENT HEMATOMA;  Surgeon: Earline Mayotte, MD;  Location: [facility];  Service: General;  Laterality: Right;   KNEE ARTHROSCOPY WITH MEDIAL  MENISECTOMY Left 07/15/2020   Procedure: Left knee arthroscopy with partial medial or lateral menisectomy, possible chondroplasty, possible partial synovectomy;  Surgeon: Lyndle Herrlich, MD;  Location: [facility];  Service: Orthopedics;  Laterality: Left;   LAMINECTOMY  07/10/2013   L4   L5   DR Yetta Barre   LAPAROSCOPIC SALPINGO OOPHERECTOMY Right 04/17/2014   Procedure: LAPAROSCOPIC SALPINGO OOPHORECTOMY RIGHT;  Surgeon: Adolphus Birchwood, MD;  Location: [facility];  Service: Gynecology;  Laterality: Right;   LYSIS OF ADHESION  04/17/2014   Procedure: LYSIS OF ADHESION;  Surgeon: Adolphus Birchwood, MD;  Location: [facility];  Service: Gynecology;;   MAXIMUM ACCESS (MAS)POSTERIOR LUMBAR INTERBODY FUSION (PLIF) 1 LEVEL  07/10/2013   Procedure: FOR MAXIMUM ACCESS (MAS) POSTERIOR LUMBAR INTERBODY FUSION (PLIF) LUMBAR FOUR-FIVE;  Surgeon: Tia Alert, MD;  Location: [facility];  Service: Neurosurgery;;  FOR MAXIMUM ACCESS (MAS) POSTERIOR LUMBAR INTERBODY FUSION (PLIF) LUMBAR FOUR-FIVE   OOPHORECTOMY     LSO   OPEN REDUCTION INTERNAL FIXATION (ORIF) DISTAL RADIAL FRACTURE Left 09/20/2018   Procedure: OPEN REDUCTION INTERNAL FIXATION (ORIF) DISTAL RADIAL FRACTURE, LEFT;  Surgeon: Kennedy Bucker, MD;  Location: [facility];  Service: Orthopedics;  Laterality: Left;   PLANTAR FASCIA SURGERY  1999  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   VAGINAL HYSTERECTOMY  2001   for bleeding and LSO for cyst   Patient Active Problem List   Diagnosis Date Noted   Reactive hypoglycemia 02/16/2022   Prediabetes 02/16/2022   Chronic venous insufficiency 12/23/2021   Lymphedema 12/13/2021   Panic attacks    Strain of knee 12/24/2019   Vitamin B12 deficiency 10/08/2019   Arthritis 07/16/2019   Edema 07/16/2019   History of abdominal pain 07/16/2019   Low back pain 07/16/2019   Vitamin D deficiency 07/16/2019   Hypothyroidism 02/28/2019   History of gastric bypass 02/28/2019   Obstructive sleep apnea syndrome 02/28/2019   Insomnia 02/28/2019    Class 3 severe obesity due to excess calories with serious comorbidity and body mass index (BMI) of 40.0 to 44.9 in adult (HCC) 02/28/2019   Elevated BP without diagnosis of hypertension 02/28/2019   Mixed hyperlipidemia 02/28/2019   Hot flashes 02/28/2019   BMI 40.0-44.9, adult (HCC) 12/16/2015   Hematoma of lower extremity 11/12/2015   Essential hypertension 06/20/2014   Pure hypercholesterolemia 06/20/2014   S/P lumbar spinal fusion 07/10/2013   Arthrodesis status 07/10/2013   Palpitations 01/02/2012   Edema of both legs 01/02/2012    PCP: Joni Reining, PA-C  REFERRING PROVIDER: Foster Simpson, DO  REFERRING DIAG: I89.0  THERAPY DIAG:  Lymphedema, not elsewhere classified [I89.0]  Rationale for Evaluation and Treatment: Rehabilitation  ONSET DATE: Insidious onset with progression over time. Greater than 20 years  SUBJECTIVE:                                                                                                                                                                                          SUBJECTIVE STATEMENT:Mary [name] presents to OT visit to address BLE lipo-lymphedema. Pt is accompanied by her supportive spouse, Mary [name]. Pt denies LE-related pain in her legs. Pt reports sleeping with both legs covered, the L leg in the new HOS garment, and the R leg bandaged, disrupts her sleep because she wakes and feels hot. We brainstormed afor a bit and she decided she will try sleeping with a lighter blanket.  PERTINENT HISTORY and contributing factors include, OA, HTN, Panic attacks 2/2 restrictive clothing, OSA (not using CPAP), s/p hysterectomy 1987, L knee sx, oophorectomy, s/p hysterectomy 2001, Gastric Bypass 2007, s/p plantar fascia sx 1999, obesity, insomnia  PAIN:  Are you having pain? No: NPRS scale: 0/10 Pain location: BLE- generalized, knees Pain description: discomfort, heavy, tired, full, sore Aggravating factors: standing, walking, extended  dependent sitting Relieving factors: elevation  PRECAUTIONS: Other: LYMPHEDEMA PRECAUTIONS: HYPOTHYROID, DIABETES SKIN PRECAUTIONS  WEIGHT BEARING RESTRICTIONS: No  FALLS:  Has patient fallen since last visit? 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  BLE COMPARATIVE LIMB VOLUMETRICS:  INTAKE: 11/01/22  LANDMARK RIGHT    R LEG (A-D) 5947.2 ml  R THIGH (E-G) 9750.0 ml  R FULL LIMB (A-G) 15697.2 ml  Limb Volume differential (LVD)  %  Volume change since initial %  Volume change overall V  (Blank rows = not tested)  LANDMARK LEFT   L LEG (A-D) 6011.6 ml  L THIGH (E-G) 9602.5 ml  L FULL LIMB (A-G) 15614.1 ml  Limb Volume differential (LVD)  LEG LVD = 1.8%, L>R % THIGH LVD = 1.5%, R>L FULL LIMB LVD = 0.53%, R>L  Volume change since initial %  Volume change overall %  (Blank rows = not tested)   9th VISIT 11/24/22  LANDMARK LEFT   L LEG (A-D) 5918.6 ml  L THIGH (E-G) 9265.5 ml  L FULL LIMB (A-G) 15184.1  ml  Limb Volume differential (LVD)  LEG volume = Decreased 1.5%;  THIGH volume decreased 3.5 %, AND  L FULL LIMB VOLUME dec BY 2.8 % SINCE 11/01/22  Volume change since initial %  Volume change overall %    20 th VISIT 01/11/23  LANDMARK LEFT   L LEG (A-D) 5181.2 ml  L THIGH (E-G)  ml  L FULL LIMB (A-G)  ml  Limb Volume differential (LVD)  L LEG (A-D) volume = Decreased 12.4% since 11/24/22. 10% GOAL MET. Did not measure L thigh or full limb today since we have not been bandaging above the knee.   Volume change since initial L LEG (A-D) volume reduction since commencing CDT on 09/02/22 measures 13.8% 10% GOAL MET.    TODAY'S TREATMENT:     MLD to  RLE/RLQ as established. Simultaneous skin care to limit infection. 2. RLE multilayer , knee length gradient compression  bandaging  PATIENT EDUCATION: Skilled OT instruction in LE self-care, including custom compression garments, or alternatives, wear and care regimes, garment positioning, contraindications. Reviewed donning and doffing garments using assistive devices .  Lymphedema management has a high burden of care, especially for Patients who have difficulty, or who are unable to reach their distal legs and feet.  reach their feet and distal legs to apply bandages, compression garments, to bathe , inspect skin and groom nails. In this case because Pt is unable to perform these activities, daily caregiver assistance with the lymphedema self-care home program  between OT visits is essential for achieving clinical success.  Person educated: Patient and Spouse Education method: Explanation, Demonstration, and Handouts Education comprehension: verbalized understanding, returned demonstration, and needs further education  HOME EXERCISE PROGRAM: Lymphatic Pumping Therex-  1 set of 10, 2 x daily, in order, seated  Daily skin inspection and care with low ph lotion matching skin ph to limit infection risk Simple Self-MLD 1 x daily During Intensive Phase CDT: multilayer compression wraps from foot to groin using gradient techniques, one limb at a time  to ensure safety During Self -Management Phase CDT: Fit with custom daytime compression garments: Elvarex, custom, flat knit, ccl 2 ( 23-32 mmHg) knee highs. Consider pairing with Compreshorts Capri length pantyhose for containment  of buttocks, thighs and abdomen.  Fit with BLE knee length Jobst RELAX w/ open toe and zipper to limit ongoing tissue fibrosis formation and facilitate increased lymphatic function during HOS  Custom-made gradient compression garments and HOS devices are medically necessary in this case because they are uniquely sized and shaped  to fit the exact dimensions of the affected extremities, and to provide accurate and consistent gradient compression essential for optimally managing this patient's symptoms of chronic, progressive lymphedema. Multiple custom compression garments are needed for optimal hygiene. Custom compression garments should be replaced q 3-6 months When worn consistently for optimal effectiveness.  6. Consider fitting with advanced Flexitouch PLUS sequential pneumatic compression device medically necessary to treat lipo-lymphedema extending above the inguinal lymph nodes, including the hips, abdomen, buttocks and abdomen. This device follows lymphatic anatomy and provides proximal to distal stimulation above the inguinal lymph nodes to ensure optimal lymphatic return via the thoracic duct.   ASSESSMENT: Pt building tolerance to new LLE  compression garment and HOS device. She was able to tolerate stocking all day, but HOS device wakes her up      at around 3 AM feeling very hot now that both legs are covered. Pt will try changing to lighter bedding this evening. Continued with RLE MLD, skin care and compression wrapping today. Slight reduction in edema and tissue density observed at distal leg and ankle. Cont as per POC.   OBJECTIVE IMPAIRMENTS: Abnormal gait, decreased activity tolerance, decreased balance, decreased knowledge of condition, decreased knowledge of use of DME, decreased mobility, difficulty walking, decreased ROM, increased edema, impaired sensation, obesity, pain, and chronic , progressive swelling and pain of buttocks, hips, abdomen and legs .   ACTIVITY LIMITATIONS: carrying, lifting, bending, sitting, standing, squatting, sleeping, stairs, transfers, bed mobility, bathing, dressing, hygiene/grooming, caring for others, and leisure pursuits, social participation, productive/ work activities  PARTICIPATION LIMITATIONS: meal prep, cleaning, laundry, interpersonal relationship, driving, shopping,  community activity, occupation, yard work, and church  PERSONAL FACTORS: Age, Past/current experiences, hx of panic attacks and claustrophobia, Time since onset of injury/illness/exacerbation,, motivation, supportive spouse and family are also affecting patient's functional outcome.   REHAB POTENTIAL: Fair Unfortunately the Lipedema does not respond to CDT and is unchanged by this protocol. Fortunately lymphedema  does respond to differing degrees based on the quality and amount of fatty fibrosis present obstructing lymphatics, and patient's tolerance if skin is hypersensitive.   EVALUATION COMPLEXITY: Moderate   GOALS: Goals reviewed with patient? Yes  SHORT TERM GOALS: Target date: 4th OT Rx visit   Pt will demonstrate understanding of lymphedema precautions and prevention strategies with modified independence using a printed reference to identify at least 5 precautions and discussing how s/he may implement them into daily life to reduce risk of progression with modified assistance ( printed reference). Baseline: Max A Goal status: 12/12/22 PROGRESSING 01/11/23 GOAL MET  2.  Pt will be able to apply multilayer, thigh length, compression wraps using gradient techniques with Max caregiver assistance to decrease limb volume, to limit infection risk, and to limit lymphedema progression.  Baseline: Dependent Goal status: 11/10/22 Goal Met  LONG TERM GOALS: Target date: 01/11/23  Given this patient's Intake score of 36.76 % on the Lymphedema Life Impact Scale (LLIS), patient will experience a reduction of at least 5 points in her perceived level of functional impairment resulting from lymphedema to improve functional performance and quality of life (QOL). Baseline: 36.76% Goal status: 12/12/22 PROGRESSING  2.  Given this patient's Intake score of  56/100% on the functional outcomes FOTO tool, patient will experience an increase in function of 3 points to improve basic and instrumental ADLs performance,  including lymphedema self-care.  Baseline: 56% Goal status: 12/12/22 PROGRESSING  3.  During Intensive phase CDT Pt will achieve at least 85% compliance with all lymphedema self-care home program components, including  daily skin care, multilayer , gradient compression wraps with daily changes, daily simple self MLD and daily lymphatic pumping therex to achieve optimal clinical outcome and to habituate self care regime for optimal LE self-management over time. Baseline: Dependent Goal status: 01/09/23 GOAL MET  4.  Pt will achieve at least a 10% volume reductions bilaterally below the knees to return limb to more typical size and shape, to limit infection risk and LE progression, to decrease pain, to improve function, and to improve body image and QOL. Baseline: Dependent Goal status: 12/12/22 PROGRESSING. 01/11/23 GOAL MET for L LEG  5.  Pt will be able to don and doff appropriate compression garments and devices using assistive devices and extra time within 1 week of issue date for optimal lymphedema self-care. Baseline: Dependent Goal status: 01/25/23 GOAL MET   PLAN:  PT FREQUENCY: 2x/week  PT DURATION: 12  weeks other: and PRN  PLANNED INTERVENTIONS: Therapeutic exercises, Therapeutic activity, Patient/Family education, Self Care, DME instructions, Manual lymph drainage, Compression bandaging, Manual therapy, and skin care during MLD, fit with appropriate custom compression garments and devices, fit with appropriate compression device  which  follows lymphatic pathways and anatomic distribution  PLAN FOR NEXT SESSION:  MLD Compression Skin care Pt/ family edu for LE self care   Loel Dubonnet, MS, OTR/L, CLT-LANA 01/30/23 4:06 PM

## 2023-02-01 ENCOUNTER — Ambulatory Visit: Payer: 59 | Admitting: Occupational Therapy

## 2023-02-01 DIAGNOSIS — I890 Lymphedema, not elsewhere classified: Secondary | ICD-10-CM | POA: Diagnosis not present

## 2023-02-03 NOTE — Therapy (Signed)
OUTPATIENT OCCUPATIONAL THERAPY TREATMENT NOTE LOWER EXTREMITY LYMPHEDEMA  Patient Name: Mary [name] MRN: [id number] DOB:1960/11/25, 62 y.o., female Today's Date: 02/03/2023    END OF SESSION:         Past Medical History:  Diagnosis Date   Anemia    low iron at times   Arthritis    Colon polyps    Complication of anesthesia 1991   Pt reports she awoke during plantar fascia surgery and felt "everything"   Heart murmur    " small per patient"    Hyperlipidemia    Hypertension    Hypothyroidism    Lactose intolerance    PAC (premature atrial contraction)    Palpitations    Panic attacks    Pt reports restrictive clothing/areas and dry mouth cause her to have attack.   Peripheral vascular disease (HCC)    "small" clot after thermal ablasion   Pneumonia 2016   "walking pneumonia" 3 times in 2016   Shortness of breath    going upstairs (due to weight). not so much since weight loss   Sleep apnea    after weight loss does not have to use cpap   Vitamin B12 deficiency    Vitamin D deficiency    Past Surgical History:  Procedure Laterality Date   APPENDECTOMY  1985   CESAREAN SECTION  1987   twins   COLONOSCOPY     COLONOSCOPY WITH PROPOFOL N/A 02/27/2018   Procedure: COLONOSCOPY WITH PROPOFOL;  Surgeon: Toledo, Boykin Nearing, MD;  Location: [facility];  Service: Gastroenterology;  Laterality: N/A;   IRRIGATION AND DEBRIDEMENT HEMATOMA Right 11/12/2015   Procedure: IRRIGATION AND DEBRIDEMENT HEMATOMA;  Surgeon: Earline Mayotte, MD;  Location: [facility];  Service: General;  Laterality: Right;   KNEE ARTHROSCOPY WITH MEDIAL MENISECTOMY Left 07/15/2020   Procedure: Left knee arthroscopy with partial medial or lateral menisectomy, possible chondroplasty, possible partial synovectomy;  Surgeon: Lyndle Herrlich, MD;  Location: [facility];  Service: Orthopedics;  Laterality: Left;   LAMINECTOMY  07/10/2013   L4   L5   DR Yetta Barre   LAPAROSCOPIC SALPINGO OOPHERECTOMY Right  04/17/2014   Procedure: LAPAROSCOPIC SALPINGO OOPHORECTOMY RIGHT;  Surgeon: Adolphus Birchwood, MD;  Location: [facility];  Service: Gynecology;  Laterality: Right;   LYSIS OF ADHESION  04/17/2014   Procedure: LYSIS OF ADHESION;  Surgeon: Adolphus Birchwood, MD;  Location: [facility];  Service: Gynecology;;   MAXIMUM ACCESS (MAS)POSTERIOR LUMBAR INTERBODY FUSION (PLIF) 1 LEVEL  07/10/2013   Procedure: FOR MAXIMUM ACCESS (MAS) POSTERIOR LUMBAR INTERBODY FUSION (PLIF) LUMBAR FOUR-FIVE;  Surgeon: Tia Alert, MD;  Location: [facility];  Service: Neurosurgery;;  FOR MAXIMUM ACCESS (MAS) POSTERIOR LUMBAR INTERBODY FUSION (PLIF) LUMBAR FOUR-FIVE   OOPHORECTOMY     LSO   OPEN REDUCTION INTERNAL FIXATION (ORIF) DISTAL RADIAL FRACTURE Left 09/20/2018   Procedure: OPEN REDUCTION INTERNAL FIXATION (ORIF) DISTAL RADIAL FRACTURE, LEFT;  Surgeon: Kennedy Bucker, MD;  Location: [facility];  Service: Orthopedics;  Laterality: Left;   PLANTAR FASCIA SURGERY  1999   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   VAGINAL HYSTERECTOMY  2001   for bleeding and LSO for cyst   Patient Active Problem List   Diagnosis Date Noted   Reactive hypoglycemia 02/16/2022   Prediabetes 02/16/2022   Chronic venous insufficiency 12/23/2021   Lymphedema 12/13/2021   Panic attacks    Strain of knee 12/24/2019   Vitamin B12 deficiency 10/08/2019   Arthritis 07/16/2019   Edema 07/16/2019   History of  abdominal pain 07/16/2019   Low back pain 07/16/2019   Vitamin D deficiency 07/16/2019   Hypothyroidism 02/28/2019   History of gastric bypass 02/28/2019   Obstructive sleep apnea syndrome 02/28/2019   Insomnia 02/28/2019   Class 3 severe obesity due to excess calories with serious comorbidity and body mass index (BMI) of 40.0 to 44.9 in adult (HCC) 02/28/2019   Elevated BP without diagnosis of hypertension 02/28/2019   Mixed hyperlipidemia 02/28/2019   Hot flashes 02/28/2019   BMI 40.0-44.9, adult (HCC) 12/16/2015   Hematoma of lower extremity 11/12/2015    Essential hypertension 06/20/2014   Pure hypercholesterolemia 06/20/2014   S/P lumbar spinal fusion 07/10/2013   Arthrodesis status 07/10/2013   Palpitations 01/02/2012   Edema of both legs 01/02/2012    PCP: Joni Reining, PA-C  REFERRING PROVIDER: Foster Simpson, DO  REFERRING DIAG: I89.0  THERAPY DIAG:  Lymphedema, not elsewhere classified  Rationale for Evaluation and Treatment: Rehabilitation  ONSET DATE: Insidious onset with progression over time. Greater than 20 years  SUBJECTIVE:                                                                                                                                                                                          SUBJECTIVE STATEMENT:Mary [name] presents to OT visit to address BLE lipo-lymphedema. Pt is accompanied by her supportive spouse, Mary [name]. Pt denies LE-related pain in her legs. Pt states new LLE compression knee high fitted last session is functioning well for her.   PERTINENT HISTORY and contributing factors include, OA, HTN, Panic attacks 2/2 restrictive clothing, OSA (not using CPAP), s/p hysterectomy 1987, L knee sx, oophorectomy, s/p hysterectomy 2001, Gastric Bypass 2007, s/p plantar fascia sx 1999, obesity, insomnia  PAIN:  Are you having pain? No: NPRS scale: 0/10 Pain location: BLE- generalized, knees Pain description: discomfort, heavy, tired, full, sore Aggravating factors: standing, walking, extended dependent sitting Relieving factors: elevation  PRECAUTIONS: Other: LYMPHEDEMA PRECAUTIONS: HYPOTHYROID, DIABETES SKIN PRECAUTIONS  WEIGHT BEARING RESTRICTIONS: No  FALLS:  Has patient fallen since last visit? 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  BLE COMPARATIVE LIMB VOLUMETRICS:  INTAKE: 11/01/22  LANDMARK RIGHT    R LEG (A-D) 5947.2 ml  R THIGH (E-G) 9750.0 ml  R FULL LIMB (A-G) 15697.2 ml  Limb Volume differential (  LVD)  %  Volume change since initial %  Volume change overall V  (Blank rows = not tested)  LANDMARK LEFT   L LEG (A-D) 6011.6 ml  L THIGH (E-G) 9602.5 ml  L FULL LIMB (A-G) 15614.1 ml  Limb Volume differential (LVD)  LEG LVD = 1.8%, L>R % THIGH LVD = 1.5%, R>L FULL LIMB LVD = 0.53%, R>L  Volume change since initial %  Volume change overall %  (Blank rows = not tested)   9th VISIT 11/24/22  LANDMARK LEFT   L LEG (A-D) 5918.6 ml  L THIGH (E-G) 9265.5 ml  L FULL LIMB (A-G) 15184.1  ml  Limb Volume differential (LVD)  LEG volume = Decreased 1.5%;  THIGH volume decreased 3.5 %, AND  L FULL LIMB VOLUME dec BY 2.8 % SINCE 11/01/22  Volume change since initial %  Volume change overall %    20 th VISIT 01/11/23  LANDMARK LEFT   L LEG (A-D) 5181.2 ml  L THIGH (E-G)  ml  L FULL LIMB (A-G)  ml  Limb Volume differential (LVD)  L LEG (A-D) volume = Decreased 12.4% since 11/24/22. 10% GOAL MET. Did not measure L thigh or full limb today since we have not been bandaging above the knee.   Volume change since initial L LEG (A-D) volume reduction since commencing CDT on 09/02/22 measures 13.8% 10% GOAL MET.    TODAY'S TREATMENT:     MLD to RLE/RLQ as established. Simultaneous skin care to limit infection. 2. RLE multilayer , knee length gradient compression  bandaging  PATIENT EDUCATION: Skilled OT instruction in LE self-care, including custom compression garments, or alternatives, wear and care regimes, garment positioning, contraindications. Reviewed donning and doffing garments using assistive devices .  Lymphedema management has a high burden of care, especially for Patients who have difficulty, or who are unable to reach their  distal legs and feet.  reach their feet and distal legs to apply bandages, compression garments, to bathe , inspect skin and groom nails. In this case because Pt is unable to perform these activities, daily caregiver assistance with the lymphedema self-care home program  between OT visits is essential for achieving clinical success.  Person educated: Patient and Spouse Education method: Explanation, Demonstration, and Handouts Education comprehension: verbalized understanding, returned demonstration, and needs further education  HOME EXERCISE PROGRAM: Lymphatic Pumping Therex-  1 set of 10, 2 x daily, in order, seated  Daily skin inspection and care with low ph lotion matching skin ph to limit infection risk Simple Self-MLD 1 x daily During Intensive Phase CDT: multilayer compression wraps from foot to groin using gradient techniques, one limb at a time  to ensure safety During Self -Management Phase CDT: Fit with custom daytime compression garments: Elvarex, custom, flat knit, ccl 2 ( 23-32 mmHg) knee highs. Consider pairing with Compreshorts Capri length pantyhose for containment of buttocks, thighs and abdomen.  Fit with BLE knee length Jobst RELAX w/ open toe and zipper to limit ongoing tissue fibrosis formation and facilitate increased lymphatic function during HOS  Custom-made gradient compression garments and HOS devices are medically necessary in this case because they are uniquely sized and shaped to fit the exact dimensions of the affected extremities, and to provide accurate and consistent gradient compression essential for optimally managing this patient's symptoms of chronic, progressive lymphedema. Multiple custom compression garments are needed for optimal hygiene. Custom compression garments should be replaced q 3-6 months When worn consistently for optimal effectiveness.  ASSESSMENT: Pt  continues to build tolerance to compression garments. She tolerated MLD to RLE today without  increased pain. Limb volume appears to be slightly reduced since commencing RLE CDT. Denser, fibrotic tissue at distal leg becoming more palpable and is mildly tender with MLD. Cont as per OC.   OBJECTIVE IMPAIRMENTS: Abnormal gait, decreased activity tolerance, decreased balance, decreased knowledge of condition, decreased knowledge of use of DME, decreased mobility, difficulty walking, decreased ROM, increased edema, impaired sensation, obesity, pain, and chronic , progressive swelling and pain of buttocks, hips, abdomen and legs .   ACTIVITY LIMITATIONS: carrying, lifting, bending, sitting, standing, squatting, sleeping, stairs, transfers, bed mobility, bathing, dressing, hygiene/grooming, caring for others, and leisure pursuits, social participation, productive/ work activities  PARTICIPATION LIMITATIONS: meal prep, cleaning, laundry, interpersonal relationship, driving, shopping, community activity, occupation, yard work, and church  PERSONAL FACTORS: Age, Past/current experiences, hx of panic attacks and claustrophobia, Time since onset of injury/illness/exacerbation,, motivation, supportive spouse and family are also affecting patient's functional outcome.   REHAB POTENTIAL: Fair Unfortunately the Lipedema does not respond to CDT and is unchanged by this protocol. Fortunately lymphedema  does respond to differing degrees based on the quality and amount of fatty fibrosis present obstructing lymphatics, and patient's tolerance if skin is hypersensitive.   EVALUATION COMPLEXITY: Moderate   GOALS: Goals reviewed with patient? Yes  SHORT TERM GOALS: Target date: 4th OT Rx visit   Pt will demonstrate understanding of lymphedema precautions and prevention strategies with modified independence using a printed reference to identify at least 5 precautions and discussing how s/he may implement them into daily life to reduce risk of progression with modified assistance ( printed  reference). Baseline: Max A Goal status: 12/12/22 PROGRESSING 01/11/23 GOAL MET  2.  Pt will be able to apply multilayer, thigh length, compression wraps using gradient techniques with Max caregiver assistance to decrease limb volume, to limit infection risk, and to limit lymphedema progression.  Baseline: Dependent Goal status: 11/10/22 Goal Met  LONG TERM GOALS: Target date: 01/11/23  Given this patient's Intake score of 36.76 % on the Lymphedema Life Impact Scale (LLIS), patient will experience a reduction of at least 5 points in her perceived level of functional impairment resulting from lymphedema to improve functional performance and quality of life (QOL). Baseline: 36.76% Goal status: 12/12/22 PROGRESSING  2.  Given this patient's Intake score of 56/100% on the functional outcomes FOTO tool, patient will experience an increase in function of 3 points to improve basic and instrumental ADLs performance, including lymphedema self-care.  Baseline: 56% Goal status: 12/12/22 PROGRESSING  3.  During Intensive phase CDT Pt will achieve at least 85% compliance with all lymphedema self-care home program components, including  daily skin care, multilayer , gradient compression wraps with daily changes, daily simple self MLD and daily lymphatic pumping therex to achieve optimal clinical outcome and to habituate self care regime for optimal LE self-management over time. Baseline: Dependent Goal status: 01/09/23 GOAL MET  4.  Pt will achieve at least a 10% volume reductions bilaterally below the knees to return limb to more typical size and shape, to limit infection risk and LE progression, to decrease pain, to improve function, and to improve body image and QOL. Baseline: Dependent Goal status: 12/12/22 PROGRESSING. 01/11/23 GOAL MET for L LEG  5.  Pt will be able to don and doff appropriate compression garments and devices using assistive devices and extra time within 1 week of issue date for optimal lymphedema  self-care. Baseline: Dependent Goal status: 01/25/23  GOAL MET   PLAN:  PT FREQUENCY: 2x/week  PT DURATION: 12  weeks other: and PRN  PLANNED INTERVENTIONS: Therapeutic exercises, Therapeutic activity, Patient/Family education, Self Care, DME instructions, Manual lymph drainage, Compression bandaging, Manual therapy, and skin care during MLD, fit with appropriate custom compression garments and devices, fit with appropriate compression device  which  follows lymphatic pathways and anatomic distribution  PLAN FOR NEXT SESSION:  MLD Compression Skin care Pt/ family edu for LE self care   Loel Dubonnet, MS, OTR/L, CLT-LANA 02/03/23 9:03 AM

## 2023-02-03 NOTE — Therapy (Deleted)
[facility] [address] [place], [place], [address] Phone: [phone]   Fax:  [phone]  Patient Details  Name: Mary [name] MRN: [id number] Date of Birth: 1960-10-27 Referring Provider:  Joni Reining, PA-C  Encounter Date: 02/01/2023   Judithann Sauger, OT 02/03/2023, 9:01 AM  [facility] [address] [place], [place], [address] Phone: [phone]   Fax:  [phone]

## 2023-02-06 ENCOUNTER — Ambulatory Visit: Payer: 59 | Admitting: Occupational Therapy

## 2023-02-06 DIAGNOSIS — I890 Lymphedema, not elsewhere classified: Secondary | ICD-10-CM | POA: Diagnosis not present

## 2023-02-06 NOTE — Therapy (Signed)
OUTPATIENT OCCUPATIONAL THERAPY TREATMENT NOTE LOWER EXTREMITY LYMPHEDEMA  Patient Name: Mary [name] MRN: [id number] DOB:16-Feb-1961, 62 y.o., female Today's Date: 02/06/2023    END OF SESSION:  OT End of Session - 02/06/23 1515     Visit Number 26    Number of Visits 36    Date for OT Re-Evaluation 04/24/23    OT Start Time 0305    OT Stop Time 0403    OT Time Calculation (min) 58 min    Activity Tolerance Patient tolerated treatment well;No increased pain    Behavior During Therapy WFL for tasks assessed/performed                 Past Medical History:  Diagnosis Date   Anemia    low iron at times   Arthritis    Colon polyps    Complication of anesthesia 1991   Pt reports she awoke during plantar fascia surgery and felt "everything"   Heart murmur    " small per patient"    Hyperlipidemia    Hypertension    Hypothyroidism    Lactose intolerance    PAC (premature atrial contraction)    Palpitations    Panic attacks    Pt reports restrictive clothing/areas and dry mouth cause her to have attack.   Peripheral vascular disease (HCC)    "small" clot after thermal ablasion   Pneumonia 2016   "walking pneumonia" 3 times in 2016   Shortness of breath    going upstairs (due to weight). not so much since weight loss   Sleep apnea    after weight loss does not have to use cpap   Vitamin B12 deficiency    Vitamin D deficiency    Past Surgical History:  Procedure Laterality Date   APPENDECTOMY  1985   CESAREAN SECTION  1987   twins   COLONOSCOPY     COLONOSCOPY WITH PROPOFOL N/A 02/27/2018   Procedure: COLONOSCOPY WITH PROPOFOL;  Surgeon: Toledo, Boykin Nearing, MD;  Location: [facility];  Service: Gastroenterology;  Laterality: N/A;   IRRIGATION AND DEBRIDEMENT HEMATOMA Right 11/12/2015   Procedure: IRRIGATION AND DEBRIDEMENT HEMATOMA;  Surgeon: Earline Mayotte, MD;  Location: [facility];  Service: General;  Laterality: Right;   KNEE ARTHROSCOPY WITH MEDIAL  MENISECTOMY Left 07/15/2020   Procedure: Left knee arthroscopy with partial medial or lateral menisectomy, possible chondroplasty, possible partial synovectomy;  Surgeon: Lyndle Herrlich, MD;  Location: [facility];  Service: Orthopedics;  Laterality: Left;   LAMINECTOMY  07/10/2013   L4   L5   DR Yetta Barre   LAPAROSCOPIC SALPINGO OOPHERECTOMY Right 04/17/2014   Procedure: LAPAROSCOPIC SALPINGO OOPHORECTOMY RIGHT;  Surgeon: Adolphus Birchwood, MD;  Location: [facility];  Service: Gynecology;  Laterality: Right;   LYSIS OF ADHESION  04/17/2014   Procedure: LYSIS OF ADHESION;  Surgeon: Adolphus Birchwood, MD;  Location: [facility];  Service: Gynecology;;   MAXIMUM ACCESS (MAS)POSTERIOR LUMBAR INTERBODY FUSION (PLIF) 1 LEVEL  07/10/2013   Procedure: FOR MAXIMUM ACCESS (MAS) POSTERIOR LUMBAR INTERBODY FUSION (PLIF) LUMBAR FOUR-FIVE;  Surgeon: Tia Alert, MD;  Location: [facility];  Service: Neurosurgery;;  FOR MAXIMUM ACCESS (MAS) POSTERIOR LUMBAR INTERBODY FUSION (PLIF) LUMBAR FOUR-FIVE   OOPHORECTOMY     LSO   OPEN REDUCTION INTERNAL FIXATION (ORIF) DISTAL RADIAL FRACTURE Left 09/20/2018   Procedure: OPEN REDUCTION INTERNAL FIXATION (ORIF) DISTAL RADIAL FRACTURE, LEFT;  Surgeon: Kennedy Bucker, MD;  Location: [facility];  Service: Orthopedics;  Laterality: Left;   PLANTAR FASCIA SURGERY  1999   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   VAGINAL HYSTERECTOMY  2001   for bleeding and LSO for cyst   Patient Active Problem List   Diagnosis Date Noted   Reactive hypoglycemia 02/16/2022   Prediabetes 02/16/2022   Chronic venous insufficiency 12/23/2021   Lymphedema 12/13/2021   Panic attacks    Strain of knee 12/24/2019   Vitamin B12 deficiency 10/08/2019   Arthritis 07/16/2019   Edema 07/16/2019   History of abdominal pain 07/16/2019   Low back pain 07/16/2019   Vitamin D deficiency 07/16/2019   Hypothyroidism 02/28/2019   History of gastric bypass 02/28/2019   Obstructive sleep apnea syndrome 02/28/2019   Insomnia 02/28/2019    Class 3 severe obesity due to excess calories with serious comorbidity and body mass index (BMI) of 40.0 to 44.9 in adult (HCC) 02/28/2019   Elevated BP without diagnosis of hypertension 02/28/2019   Mixed hyperlipidemia 02/28/2019   Hot flashes 02/28/2019   BMI 40.0-44.9, adult (HCC) 12/16/2015   Hematoma of lower extremity 11/12/2015   Essential hypertension 06/20/2014   Pure hypercholesterolemia 06/20/2014   S/P lumbar spinal fusion 07/10/2013   Arthrodesis status 07/10/2013   Palpitations 01/02/2012   Edema of both legs 01/02/2012    PCP: Joni Reining, PA-C  REFERRING PROVIDER: Foster Simpson, DO  REFERRING DIAG: I89.0  THERAPY DIAG:  Lymphedema, not elsewhere classified  Rationale for Evaluation and Treatment: Rehabilitation  ONSET DATE: Insidious onset with progression over time. Greater than 20 years  SUBJECTIVE:                                                                                                                                                                                          SUBJECTIVE STATEMENT:Mary [name] presents to OT visit to address BLE lipo-lymphedema. Pt is accompanied by her supportive spouse, Mary [name]. Pt denies LE-related pain in her legs. Pt reports increased swelling in R dorsal foot and toes.  PERTINENT HISTORY and contributing factors include, OA, HTN, Panic attacks 2/2 restrictive clothing, OSA (not using CPAP), s/p hysterectomy 1987, L knee sx, oophorectomy, s/p hysterectomy 2001, Gastric Bypass 2007, s/p plantar fascia sx 1999, obesity, insomnia  PAIN:  Are you having pain? No: NPRS scale: 0/10 Pain location: BLE- generalized, knees Pain description: discomfort, heavy, tired, full, sore Aggravating factors: standing, walking, extended dependent sitting Relieving factors: elevation  PRECAUTIONS: Other: LYMPHEDEMA PRECAUTIONS: HYPOTHYROID, DIABETES SKIN PRECAUTIONS  WEIGHT BEARING RESTRICTIONS: No  FALLS:  Has patient  fallen since last visit? 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  BLE COMPARATIVE LIMB VOLUMETRICS:  INTAKE: 11/01/22  LANDMARK RIGHT    R LEG (A-D) 5947.2 ml  R THIGH (E-G) 9750.0 ml  R FULL LIMB (A-G) 15697.2 ml  Limb Volume differential (LVD)  %  Volume change since initial %  Volume change overall V  (Blank rows = not tested)  LANDMARK LEFT   L LEG (A-D) 6011.6 ml  L THIGH (E-G) 9602.5 ml  L FULL LIMB (A-G) 15614.1 ml  Limb Volume differential (LVD)  LEG LVD = 1.8%, L>R % THIGH LVD = 1.5%, R>L FULL LIMB LVD = 0.53%, R>L  Volume change since initial %  Volume change overall %  (Blank rows = not tested)   9th VISIT 11/24/22  LANDMARK LEFT   L LEG (A-D) 5918.6 ml  L THIGH (E-G) 9265.5 ml  L FULL LIMB (A-G) 15184.1  ml  Limb Volume differential (LVD)  LEG volume = Decreased 1.5%;  THIGH volume decreased 3.5 %, AND  L FULL LIMB VOLUME dec BY 2.8 % SINCE 11/01/22  Volume change since initial %  Volume change overall %    20 th VISIT 01/11/23  LANDMARK LEFT   L LEG (A-D) 5181.2 ml  L THIGH (E-G)  ml  L FULL LIMB (A-G)  ml  Limb Volume differential (LVD)  L LEG (A-D) volume = Decreased 12.4% since 11/24/22. 10% GOAL MET. Did not measure L thigh or full limb today since we have not been bandaging above the knee.   Volume change since initial L LEG (A-D) volume reduction since commencing CDT on 09/02/22 measures 13.8% 10% GOAL MET.    TODAY'S TREATMENT:     MLD to RLE/RLQ as established. Simultaneous skin care to limit infection. 2. RLE multilayer , knee length gradient compression  bandaging  PATIENT EDUCATION: Skilled OT instruction in LE self-care,  including custom compression garments, or alternatives, wear and care regimes, garment positioning, contraindications. Reviewed donning and doffing garments using assistive devices .  Lymphedema management has a high burden of care, especially for Patients who have difficulty, or who are unable to reach their distal legs and feet.  reach their feet and distal legs to apply bandages, compression garments, to bathe , inspect skin and groom nails. In this case because Pt is unable to perform these activities, daily caregiver assistance with the lymphedema self-care home program  between OT visits is essential for achieving clinical success.  Person educated: Patient and Spouse Education method: Explanation, Demonstration, and Handouts Education comprehension: verbalized understanding, returned demonstration, and needs further education  HOME EXERCISE PROGRAM: Lymphatic Pumping Therex-  1 set of 10, 2 x daily, in order, seated  Daily skin inspection and care with low ph lotion matching skin ph to limit infection risk Simple Self-MLD 1 x daily During Intensive Phase CDT: multilayer compression wraps from foot to groin using gradient techniques, one limb at a time  to ensure safety During Self -Management Phase CDT: Fit with custom daytime compression garments: Elvarex, custom, flat knit, ccl 2 ( 23-32 mmHg) knee highs. Consider pairing with Compreshorts Capri length pantyhose for containment of buttocks, thighs and abdomen.  Fit with BLE knee length Jobst RELAX w/ open toe and zipper to limit ongoing tissue fibrosis formation and facilitate increased lymphatic function during HOS  Custom-made gradient compression garments and HOS devices are medically necessary in this case because they are uniquely sized and shaped to fit the exact dimensions of the affected extremities, and to provide accurate and consistent gradient compression essential for optimally managing this patient's symptoms of chronic,  progressive lymphedema. Multiple custom compression garments are needed for optimal hygiene. Custom compression garments should be replaced q 3-6 months When worn consistently for optimal effectiveness.  ASSESSMENT: Pt tolerated MLD to RLE today without increased pain. Limb volume appears to be slightly reduced since commencing RLE CDT, but change is slight. Denser, fibrotic tissue at distal leg becoming more palpable and is mildly tender with MLD. Wraps applied as establlished without increased pain. Cont as per POC.   OBJECTIVE IMPAIRMENTS: Abnormal gait, decreased activity tolerance, decreased balance, decreased knowledge of condition, decreased knowledge of use of DME, decreased mobility, difficulty walking, decreased ROM, increased edema, impaired sensation, obesity, pain, and chronic , progressive swelling and pain of buttocks, hips, abdomen and legs .   ACTIVITY LIMITATIONS: carrying, lifting, bending, sitting, standing, squatting, sleeping, stairs, transfers, bed mobility, bathing, dressing, hygiene/grooming, caring for others, and leisure pursuits, social participation, productive/ work activities  PARTICIPATION LIMITATIONS: meal prep, cleaning, laundry, interpersonal relationship, driving, shopping, community activity, occupation, yard work, and church  PERSONAL FACTORS: Age, Past/current experiences, hx of panic attacks and claustrophobia, Time since onset of injury/illness/exacerbation,, motivation, supportive spouse and family are also affecting patient's functional outcome.   REHAB POTENTIAL: Fair Unfortunately the Lipedema does not respond to CDT and is unchanged by this protocol. Fortunately lymphedema  does respond to differing degrees based on the quality and amount of fatty fibrosis present obstructing lymphatics, and patient's tolerance if skin is hypersensitive.   EVALUATION COMPLEXITY: Moderate   GOALS: Goals reviewed with patient? Yes  SHORT TERM GOALS: Target date: 4th OT  Rx visit   Pt will demonstrate understanding of lymphedema precautions and prevention strategies with modified independence using a printed reference to identify at least 5 precautions and discussing how s/he may implement them into daily life to reduce risk of progression with modified assistance ( printed reference). Baseline: Max A Goal status: 12/12/22 PROGRESSING 01/11/23 GOAL MET  2.  Pt will be able to apply multilayer, thigh length, compression wraps using gradient techniques with Max caregiver assistance to decrease limb volume, to limit infection risk, and to limit lymphedema progression.  Baseline: Dependent Goal status: 11/10/22 Goal Met  LONG TERM GOALS: Target date: 01/11/23  Given this patient's Intake score of 36.76 % on the Lymphedema Life Impact Scale (LLIS), patient will experience a reduction of at least 5 points in her perceived level of functional impairment resulting from lymphedema to improve functional performance and quality of life (QOL). Baseline: 36.76% Goal status: 12/12/22 PROGRESSING  2.  Given this patient's Intake score of 56/100% on the functional outcomes FOTO tool, patient will experience an increase in function of 3 points to improve basic and instrumental ADLs performance, including lymphedema self-care.  Baseline: 56% Goal status: 12/12/22 PROGRESSING  3.  During Intensive phase CDT Pt will achieve at least 85% compliance with all lymphedema self-care home program components, including  daily skin care, multilayer , gradient compression wraps with daily changes, daily simple self MLD and daily lymphatic pumping therex to achieve optimal clinical outcome and to habituate self care regime for optimal LE self-management over time. Baseline: Dependent Goal status: 01/09/23 GOAL MET  4.  Pt will achieve at least a 10% volume reductions bilaterally below the knees to  return limb to more typical size and shape, to limit infection risk and LE progression, to decrease pain,  to improve function, and to improve body image and QOL. Baseline: Dependent Goal status: 12/12/22 PROGRESSING. 01/11/23 GOAL MET for L LEG  5.  Pt will be able to don and doff appropriate compression garments and devices using assistive devices and extra time within 1 week of issue date for optimal lymphedema self-care. Baseline: Dependent Goal status: 01/25/23 GOAL MET   PLAN:  PT FREQUENCY: 2x/week  PT DURATION: 12  weeks other: and PRN  PLANNED INTERVENTIONS: Therapeutic exercises, Therapeutic activity, Patient/Family education, Self Care, DME instructions, Manual lymph drainage, Compression bandaging, Manual therapy, and skin care during MLD, fit with appropriate custom compression garments and devices, fit with appropriate compression device  which  follows lymphatic pathways and anatomic distribution  PLAN FOR NEXT SESSION:  MLD Compression Skin care Pt/ family edu for LE self care   Loel Dubonnet, MS, OTR/L, CLT-LANA 02/06/23 4:04 PM

## 2023-02-07 ENCOUNTER — Other Ambulatory Visit: Payer: Self-pay | Admitting: Physician Assistant

## 2023-02-08 ENCOUNTER — Ambulatory Visit: Payer: 59 | Admitting: Occupational Therapy

## 2023-02-08 ENCOUNTER — Encounter: Payer: Self-pay | Admitting: Occupational Therapy

## 2023-02-08 DIAGNOSIS — I890 Lymphedema, not elsewhere classified: Secondary | ICD-10-CM

## 2023-02-09 NOTE — Therapy (Signed)
OUTPATIENT OCCUPATIONAL THERAPY TREATMENT NOTE LOWER EXTREMITY LYMPHEDEMA  Patient Name: Mary [name] MRN: [id number] DOB:[birth year], 62 y.o., female Today's Date: 02/09/2023    END OF SESSION:  OT End of Session - 02/08/23 1523     Visit Number 27    Number of Visits 36    Date for OT Re-Evaluation 04/24/23    OT Start Time 0305    OT Stop Time 0405    OT Time Calculation (min) 60 min    Activity Tolerance Patient tolerated treatment well;No increased pain    Behavior During Therapy WFL for tasks assessed/performed                 Past Medical History:  Diagnosis Date   Anemia    low iron at times   Arthritis    Colon polyps    Complication of anesthesia 1991   Pt reports she awoke during plantar fascia surgery and felt "everything"   Heart murmur    " small per patient"    Hyperlipidemia    Hypertension    Hypothyroidism    Lactose intolerance    PAC (premature atrial contraction)    Palpitations    Panic attacks    Pt reports restrictive clothing/areas and dry mouth cause her to have attack.   Peripheral vascular disease (HCC)    "small" clot after thermal ablasion   Pneumonia 2016   "walking pneumonia" 3 times in 2016   Shortness of breath    going upstairs (due to weight). not so much since weight loss   Sleep apnea    after weight loss does not have to use cpap   Vitamin B12 deficiency    Vitamin D deficiency    Past Surgical History:  Procedure Laterality Date   APPENDECTOMY  1985   CESAREAN SECTION  1987   twins   COLONOSCOPY     COLONOSCOPY WITH PROPOFOL N/A 02/27/2018   Procedure: COLONOSCOPY WITH PROPOFOL;  Surgeon: Toledo, Boykin Nearing, MD;  Location: [facility];  Service: Gastroenterology;  Laterality: N/A;   IRRIGATION AND DEBRIDEMENT HEMATOMA Right 11/12/2015   Procedure: IRRIGATION AND DEBRIDEMENT HEMATOMA;  Surgeon: Earline Mayotte, MD;  Location: [facility];  Service: General;  Laterality: Right;   KNEE ARTHROSCOPY WITH MEDIAL  MENISECTOMY Left 07/15/2020   Procedure: Left knee arthroscopy with partial medial or lateral menisectomy, possible chondroplasty, possible partial synovectomy;  Surgeon: Lyndle Herrlich, MD;  Location: [facility];  Service: Orthopedics;  Laterality: Left;   LAMINECTOMY  07/10/2013   L4   L5   DR Yetta Barre   LAPAROSCOPIC SALPINGO OOPHERECTOMY Right 04/17/2014   Procedure: LAPAROSCOPIC SALPINGO OOPHORECTOMY RIGHT;  Surgeon: Adolphus Birchwood, MD;  Location: [facility];  Service: Gynecology;  Laterality: Right;   LYSIS OF ADHESION  04/17/2014   Procedure: LYSIS OF ADHESION;  Surgeon: Adolphus Birchwood, MD;  Location: [facility];  Service: Gynecology;;   MAXIMUM ACCESS (MAS)POSTERIOR LUMBAR INTERBODY FUSION (PLIF) 1 LEVEL  07/10/2013   Procedure: FOR MAXIMUM ACCESS (MAS) POSTERIOR LUMBAR INTERBODY FUSION (PLIF) LUMBAR FOUR-FIVE;  Surgeon: Tia Alert, MD;  Location: [facility];  Service: Neurosurgery;;  FOR MAXIMUM ACCESS (MAS) POSTERIOR LUMBAR INTERBODY FUSION (PLIF) LUMBAR FOUR-FIVE   OOPHORECTOMY     LSO   OPEN REDUCTION INTERNAL FIXATION (ORIF) DISTAL RADIAL FRACTURE Left 09/20/2018   Procedure: OPEN REDUCTION INTERNAL FIXATION (ORIF) DISTAL RADIAL FRACTURE, LEFT;  Surgeon: Kennedy Bucker, MD;  Location: [facility];  Service: Orthopedics;  Laterality: Left;   PLANTAR FASCIA SURGERY  1999   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   VAGINAL HYSTERECTOMY  2001   for bleeding and LSO for cyst   Patient Active Problem List   Diagnosis Date Noted   Reactive hypoglycemia 02/16/2022   Prediabetes 02/16/2022   Chronic venous insufficiency 12/23/2021   Lymphedema 12/13/2021   Panic attacks    Strain of knee 12/24/2019   Vitamin B12 deficiency 10/08/2019   Arthritis 07/16/2019   Edema 07/16/2019   History of abdominal pain 07/16/2019   Low back pain 07/16/2019   Vitamin D deficiency 07/16/2019   Hypothyroidism 02/28/2019   History of gastric bypass 02/28/2019   Obstructive sleep apnea syndrome 02/28/2019   Insomnia 02/28/2019    Class 3 severe obesity due to excess calories with serious comorbidity and body mass index (BMI) of 40.0 to 44.9 in adult (HCC) 02/28/2019   Elevated BP without diagnosis of hypertension 02/28/2019   Mixed hyperlipidemia 02/28/2019   Hot flashes 02/28/2019   BMI 40.0-44.9, adult (HCC) 12/16/2015   Hematoma of lower extremity 11/12/2015   Essential hypertension 06/20/2014   Pure hypercholesterolemia 06/20/2014   S/P lumbar spinal fusion 07/10/2013   Arthrodesis status 07/10/2013   Palpitations 01/02/2012   Edema of both legs 01/02/2012    PCP: Joni Reining, PA-C  REFERRING PROVIDER: Foster Simpson, DO  REFERRING DIAG: I89.0  THERAPY DIAG:  Lymphedema, not elsewhere classified  Rationale for Evaluation and Treatment: Rehabilitation  ONSET DATE: Insidious onset with progression over time. Greater than 20 years  SUBJECTIVE:                                                                                                                                                                                          SUBJECTIVE STATEMENT:Mary [name] presents to OT visit to address BLE lipo-lymphedema. Pt is accompanied by her supportive spouse, Mary [name]. Pt denies LE-related pain in her legs. Pt reports increased swelling in R dorsal foot and toes.  PERTINENT HISTORY and contributing factors include, OA, HTN, Panic attacks 2/2 restrictive clothing, OSA (not using CPAP), s/p hysterectomy 1987, L knee sx, oophorectomy, s/p hysterectomy 2001, Gastric Bypass 2007, s/p plantar fascia sx 1999, obesity, insomnia  PAIN:  Are you having pain? No: NPRS scale: 0/10 Pain location: BLE- generalized, knees Pain description: discomfort, heavy, tired, full, sore Aggravating factors: standing, walking, extended dependent sitting Relieving factors: elevation  PRECAUTIONS: Other: LYMPHEDEMA PRECAUTIONS: HYPOTHYROID, DIABETES SKIN PRECAUTIONS  WEIGHT BEARING RESTRICTIONS: No  FALLS:  Has patient  fallen since last visit? 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  BLE COMPARATIVE LIMB VOLUMETRICS:  INTAKE: 11/01/22  LANDMARK RIGHT    R LEG (A-D) 5947.2 ml  R THIGH (E-G) 9750.0 ml  R FULL LIMB (A-G) 15697.2 ml  Limb Volume differential (LVD)  %  Volume change since initial %  Volume change overall V  (Blank rows = not tested)  LANDMARK LEFT   L LEG (A-D) 6011.6 ml  L THIGH (E-G) 9602.5 ml  L FULL LIMB (A-G) 15614.1 ml  Limb Volume differential (LVD)  LEG LVD = 1.8%, L>R % THIGH LVD = 1.5%, R>L FULL LIMB LVD = 0.53%, R>L  Volume change since initial %  Volume change overall %  (Blank rows = not tested)   9th VISIT 11/24/22  LANDMARK LEFT   L LEG (A-D) 5918.6 ml  L THIGH (E-G) 9265.5 ml  L FULL LIMB (A-G) 15184.1  ml  Limb Volume differential (LVD)  LEG volume = Decreased 1.5%;  THIGH volume decreased 3.5 %, AND  L FULL LIMB VOLUME dec BY 2.8 % SINCE 11/01/22  Volume change since initial %  Volume change overall %    20 th VISIT 01/11/23  LANDMARK LEFT   L LEG (A-D) 5181.2 ml  L THIGH (E-G)  ml  L FULL LIMB (A-G)  ml  Limb Volume differential (LVD)  L LEG (A-D) volume = Decreased 12.4% since 11/24/22. 10% GOAL MET. Did not measure L thigh or full limb today since we have not been bandaging above the knee.   Volume change since initial L LEG (A-D) volume reduction since commencing CDT on 09/02/22 measures 13.8% 10% GOAL MET.    TODAY'S TREATMENT:     MLD to RLE/RLQ as established. Simultaneous skin care to limit infection. 2. RLE multilayer , knee length gradient compression  bandaging  PATIENT EDUCATION: Skilled OT instruction in LE self-care,  including custom compression garments, or alternatives, wear and care regimes, garment positioning, contraindications. Reviewed donning and doffing garments using assistive devices .  Lymphedema management has a high burden of care, especially for Patients who have difficulty, or who are unable to reach their distal legs and feet.  reach their feet and distal legs to apply bandages, compression garments, to bathe , inspect skin and groom nails. In this case because Pt is unable to perform these activities, daily caregiver assistance with the lymphedema self-care home program  between OT visits is essential for achieving clinical success.  Person educated: Patient and Spouse Education method: Explanation, Demonstration, and Handouts Education comprehension: verbalized understanding, returned demonstration, and needs further education  HOME EXERCISE PROGRAM: Lymphatic Pumping Therex-  1 set of 10, 2 x daily, in order, seated  Daily skin inspection and care with low ph lotion matching skin ph to limit infection risk Simple Self-MLD 1 x daily During Intensive Phase CDT: multilayer compression wraps from foot to groin using gradient techniques, one limb at a time  to ensure safety During Self -Management Phase CDT: Fit with custom daytime compression garments: Elvarex, custom, flat knit, ccl 2 ( 23-32 mmHg) knee highs. Consider pairing with Compreshorts Capri length pantyhose for containment of buttocks, thighs and abdomen.  Fit with BLE knee length Jobst RELAX w/ open toe and zipper to limit ongoing tissue fibrosis formation and facilitate increased lymphatic function during HOS  Custom-made gradient compression garments and HOS devices are medically necessary in this case because they are uniquely sized and shaped to fit the exact dimensions of the affected extremities, and to provide accurate and consistent gradient compression essential for optimally managing this patient's symptoms of chronic,  progressive lymphedema. Multiple custom compression garments are needed for optimal hygiene. Custom compression garments should be replaced q 3-6 months When worn consistently for optimal effectiveness.  ASSESSMENT: Pt tolerated MLD to RLE today without increased pain. Limb volume appears to be slightly reduced since commencing RLE CDT, but change is slight. Denser, fibrotic tissue at distal leg becoming more palpable and is mildly tender with MLD. Wraps applied as establlished without increased pain. Cont as per POC.   OBJECTIVE IMPAIRMENTS: Abnormal gait, decreased activity tolerance, decreased balance, decreased knowledge of condition, decreased knowledge of use of DME, decreased mobility, difficulty walking, decreased ROM, increased edema, impaired sensation, obesity, pain, and chronic , progressive swelling and pain of buttocks, hips, abdomen and legs .   ACTIVITY LIMITATIONS: carrying, lifting, bending, sitting, standing, squatting, sleeping, stairs, transfers, bed mobility, bathing, dressing, hygiene/grooming, caring for others, and leisure pursuits, social participation, productive/ work activities  PARTICIPATION LIMITATIONS: meal prep, cleaning, laundry, interpersonal relationship, driving, shopping, community activity, occupation, yard work, and church  PERSONAL FACTORS: Age, Past/current experiences, hx of panic attacks and claustrophobia, Time since onset of injury/illness/exacerbation,, motivation, supportive spouse and family are also affecting patient's functional outcome.   REHAB POTENTIAL: Fair Unfortunately the Lipedema does not respond to CDT and is unchanged by this protocol. Fortunately lymphedema  does respond to differing degrees based on the quality and amount of fatty fibrosis present obstructing lymphatics, and patient's tolerance if skin is hypersensitive.   EVALUATION COMPLEXITY: Moderate   GOALS: Goals reviewed with patient? Yes  SHORT TERM GOALS: Target date: 4th OT  Rx visit   Pt will demonstrate understanding of lymphedema precautions and prevention strategies with modified independence using a printed reference to identify at least 5 precautions and discussing how s/he may implement them into daily life to reduce risk of progression with modified assistance ( printed reference). Baseline: Max A Goal status: 12/12/22 PROGRESSING 01/11/23 GOAL MET  2.  Pt will be able to apply multilayer, thigh length, compression wraps using gradient techniques with Max caregiver assistance to decrease limb volume, to limit infection risk, and to limit lymphedema progression.  Baseline: Dependent Goal status: 11/10/22 Goal Met  LONG TERM GOALS: Target date: 01/11/23  Given this patient's Intake score of 36.76 % on the Lymphedema Life Impact Scale (LLIS), patient will experience a reduction of at least 5 points in her perceived level of functional impairment resulting from lymphedema to improve functional performance and quality of life (QOL). Baseline: 36.76% Goal status: 12/12/22 PROGRESSING  2.  Given this patient's Intake score of 56/100% on the functional outcomes FOTO tool, patient will experience an increase in function of 3 points to improve basic and instrumental ADLs performance, including lymphedema self-care.  Baseline: 56% Goal status: 12/12/22 PROGRESSING  3.  During Intensive phase CDT Pt will achieve at least 85% compliance with all lymphedema self-care home program components, including  daily skin care, multilayer , gradient compression wraps with daily changes, daily simple self MLD and daily lymphatic pumping therex to achieve optimal clinical outcome and to habituate self care regime for optimal LE self-management over time. Baseline: Dependent Goal status: 01/09/23 GOAL MET  4.  Pt will achieve at least a 10% volume reductions bilaterally below the knees to  return limb to more typical size and shape, to limit infection risk and LE progression, to decrease pain,  to improve function, and to improve body image and QOL. Baseline: Dependent Goal status: 12/12/22 PROGRESSING. 01/11/23 GOAL MET for L LEG  5.  Pt will be able to don and doff appropriate compression garments and devices using assistive devices and extra time within 1 week of issue date for optimal lymphedema self-care. Baseline: Dependent Goal status: 01/25/23 GOAL MET   PLAN:  PT FREQUENCY: 2x/week  PT DURATION: 12  weeks other: and PRN  PLANNED INTERVENTIONS: Therapeutic exercises, Therapeutic activity, Patient/Family education, Self Care, DME instructions, Manual lymph drainage, Compression bandaging, Manual therapy, and skin care during MLD, fit with appropriate custom compression garments and devices, fit with appropriate compression device  which  follows lymphatic pathways and anatomic distribution  PLAN FOR NEXT SESSION:  MLD Compression Skin care Pt/ family edu for LE self care   Loel Dubonnet, MS, OTR/L, CLT-LANA 02/09/23 8:02 AM

## 2023-02-13 ENCOUNTER — Ambulatory Visit: Payer: 59 | Attending: Plastic Surgery | Admitting: Occupational Therapy

## 2023-02-13 DIAGNOSIS — I890 Lymphedema, not elsewhere classified: Secondary | ICD-10-CM | POA: Insufficient documentation

## 2023-02-13 NOTE — Therapy (Signed)
OUTPATIENT OCCUPATIONAL THERAPY TREATMENT NOTE LOWER EXTREMITY LYMPHEDEMA  Patient Name: Mary [name] MRN: [id number] DOB:04-Dec-1960, 62 y.o., female Today's Date: 02/13/2023    END OF SESSION:  OT End of Session - 02/13/23 1606     Visit Number 28    Number of Visits 36    Date for OT Re-Evaluation 04/24/23    OT Start Time 0300    OT Stop Time 0400    OT Time Calculation (min) 60 min    Activity Tolerance Patient tolerated treatment well;No increased pain    Behavior During Therapy WFL for tasks assessed/performed                 Past Medical History:  Diagnosis Date   Anemia    low iron at times   Arthritis    Colon polyps    Complication of anesthesia 1991   Pt reports she awoke during plantar fascia surgery and felt "everything"   Heart murmur    " small per patient"    Hyperlipidemia    Hypertension    Hypothyroidism    Lactose intolerance    PAC (premature atrial contraction)    Palpitations    Panic attacks    Pt reports restrictive clothing/areas and dry mouth cause her to have attack.   Peripheral vascular disease (HCC)    "small" clot after thermal ablasion   Pneumonia 2016   "walking pneumonia" 3 times in 2016   Shortness of breath    going upstairs (due to weight). not so much since weight loss   Sleep apnea    after weight loss does not have to use cpap   Vitamin B12 deficiency    Vitamin D deficiency    Past Surgical History:  Procedure Laterality Date   APPENDECTOMY  1985   CESAREAN SECTION  1987   twins   COLONOSCOPY     COLONOSCOPY WITH PROPOFOL N/A 02/27/2018   Procedure: COLONOSCOPY WITH PROPOFOL;  Surgeon: Toledo, Boykin Nearing, MD;  Location: [facility];  Service: Gastroenterology;  Laterality: N/A;   IRRIGATION AND DEBRIDEMENT HEMATOMA Right 11/12/2015   Procedure: IRRIGATION AND DEBRIDEMENT HEMATOMA;  Surgeon: Earline Mayotte, MD;  Location: [facility];  Service: General;  Laterality: Right;   KNEE ARTHROSCOPY WITH MEDIAL  MENISECTOMY Left 07/15/2020   Procedure: Left knee arthroscopy with partial medial or lateral menisectomy, possible chondroplasty, possible partial synovectomy;  Surgeon: Lyndle Herrlich, MD;  Location: [facility];  Service: Orthopedics;  Laterality: Left;   LAMINECTOMY  07/10/2013   L4   L5   DR Yetta Barre   LAPAROSCOPIC SALPINGO OOPHERECTOMY Right 04/17/2014   Procedure: LAPAROSCOPIC SALPINGO OOPHORECTOMY RIGHT;  Surgeon: Adolphus Birchwood, MD;  Location: [facility];  Service: Gynecology;  Laterality: Right;   LYSIS OF ADHESION  04/17/2014   Procedure: LYSIS OF ADHESION;  Surgeon: Adolphus Birchwood, MD;  Location: [facility];  Service: Gynecology;;   MAXIMUM ACCESS (MAS)POSTERIOR LUMBAR INTERBODY FUSION (PLIF) 1 LEVEL  07/10/2013   Procedure: FOR MAXIMUM ACCESS (MAS) POSTERIOR LUMBAR INTERBODY FUSION (PLIF) LUMBAR FOUR-FIVE;  Surgeon: Tia Alert, MD;  Location: [facility];  Service: Neurosurgery;;  FOR MAXIMUM ACCESS (MAS) POSTERIOR LUMBAR INTERBODY FUSION (PLIF) LUMBAR FOUR-FIVE   OOPHORECTOMY     LSO   OPEN REDUCTION INTERNAL FIXATION (ORIF) DISTAL RADIAL FRACTURE Left 09/20/2018   Procedure: OPEN REDUCTION INTERNAL FIXATION (ORIF) DISTAL RADIAL FRACTURE, LEFT;  Surgeon: Kennedy Bucker, MD;  Location: [facility];  Service: Orthopedics;  Laterality: Left;   PLANTAR FASCIA SURGERY  1999   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   VAGINAL HYSTERECTOMY  2001   for bleeding and LSO for cyst   Patient Active Problem List   Diagnosis Date Noted   Reactive hypoglycemia 02/16/2022   Prediabetes 02/16/2022   Chronic venous insufficiency 12/23/2021   Lymphedema 12/13/2021   Panic attacks    Strain of knee 12/24/2019   Vitamin B12 deficiency 10/08/2019   Arthritis 07/16/2019   Edema 07/16/2019   History of abdominal pain 07/16/2019   Low back pain 07/16/2019   Vitamin D deficiency 07/16/2019   Hypothyroidism 02/28/2019   History of gastric bypass 02/28/2019   Obstructive sleep apnea syndrome 02/28/2019   Insomnia 02/28/2019    Class 3 severe obesity due to excess calories with serious comorbidity and body mass index (BMI) of 40.0 to 44.9 in adult (HCC) 02/28/2019   Elevated BP without diagnosis of hypertension 02/28/2019   Mixed hyperlipidemia 02/28/2019   Hot flashes 02/28/2019   BMI 40.0-44.9, adult (HCC) 12/16/2015   Hematoma of lower extremity 11/12/2015   Essential hypertension 06/20/2014   Pure hypercholesterolemia 06/20/2014   S/P lumbar spinal fusion 07/10/2013   Arthrodesis status 07/10/2013   Palpitations 01/02/2012   Edema of both legs 01/02/2012    PCP: Joni Reining, PA-C  REFERRING PROVIDER: Foster Simpson, DO  REFERRING DIAG: I89.0  THERAPY DIAG:  Lymphedema, not elsewhere classified  Rationale for Evaluation and Treatment: Rehabilitation  ONSET DATE: Insidious onset with progression over time. Greater than 20 years  SUBJECTIVE:                                                                                                                                                                                          SUBJECTIVE STATEMENT:Mary [name] presents to OT visit to address BLE lipo-lymphedema. Pt is accompanied by her supportive spouse, Mary [name]. Pt denies LE-related pain in her legs. Pt has no new concerns  PERTINENT HISTORY and contributing factors include, OA, HTN, Panic attacks 2/2 restrictive clothing, OSA (not using CPAP), s/p hysterectomy 1987, L knee sx, oophorectomy, s/p hysterectomy 2001, Gastric Bypass 2007, s/p plantar fascia sx 1999, obesity, insomnia  PAIN:  Are you having pain? No: NPRS scale: 0/10 Pain location: BLE- generalized, knees Pain description: discomfort, heavy, tired, full, sore Aggravating factors: standing, walking, extended dependent sitting Relieving factors: elevation  PRECAUTIONS: Other: LYMPHEDEMA PRECAUTIONS: HYPOTHYROID, DIABETES SKIN PRECAUTIONS  WEIGHT BEARING RESTRICTIONS: No  FALLS:  Has patient fallen since last visit?  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  BLE COMPARATIVE LIMB VOLUMETRICS:  INTAKE: 11/01/22  LANDMARK RIGHT    R LEG (A-D) 5947.2 ml  R THIGH (E-G) 9750.0 ml  R FULL LIMB (A-G) 15697.2 ml  Limb Volume differential (LVD)  %  Volume change since initial %  Volume change overall V  (Blank rows = not tested)  LANDMARK LEFT   L LEG (A-D) 6011.6 ml  L THIGH (E-G) 9602.5 ml  L FULL LIMB (A-G) 15614.1 ml  Limb Volume differential (LVD)  LEG LVD = 1.8%, L>R % THIGH LVD = 1.5%, R>L FULL LIMB LVD = 0.53%, R>L  Volume change since initial %  Volume change overall %  (Blank rows = not tested)   9th VISIT 11/24/22  LANDMARK LEFT   L LEG (A-D) 5918.6 ml  L THIGH (E-G) 9265.5 ml  L FULL LIMB (A-G) 15184.1  ml  Limb Volume differential (LVD)  LEG volume = Decreased 1.5%;  THIGH volume decreased 3.5 %, AND  L FULL LIMB VOLUME dec BY 2.8 % SINCE 11/01/22  Volume change since initial %  Volume change overall %    20 th VISIT 01/11/23  LANDMARK LEFT   L LEG (A-D) 5181.2 ml  L THIGH (E-G)  ml  L FULL LIMB (A-G)  ml  Limb Volume differential (LVD)  L LEG (A-D) volume = Decreased 12.4% since 11/24/22. 10% GOAL MET. Did not measure L thigh or full limb today since we have not been bandaging above the knee.   Volume change since initial L LEG (A-D) volume reduction since commencing CDT on 09/02/22 measures 13.8% 10% GOAL MET.    TODAY'S TREATMENT:     MLD to RLE/RLQ as established. Simultaneous skin care to limit infection. 2. RLE multilayer , knee length gradient compression  bandaging  PATIENT EDUCATION: Skilled OT instruction in LE self-care, including custom  compression garments, or alternatives, wear and care regimes, garment positioning, contraindications. Reviewed donning and doffing garments using assistive devices .  Lymphedema management has a high burden of care, especially for Patients who have difficulty, or who are unable to reach their distal legs and feet.  reach their feet and distal legs to apply bandages, compression garments, to bathe , inspect skin and groom nails. In this case because Pt is unable to perform these activities, daily caregiver assistance with the lymphedema self-care home program  between OT visits is essential for achieving clinical success.  Person educated: Patient and Spouse Education method: Explanation, Demonstration, and Handouts Education comprehension: verbalized understanding, returned demonstration, and needs further education  HOME EXERCISE PROGRAM: Lymphatic Pumping Therex-  1 set of 10, 2 x daily, in order, seated  Daily skin inspection and care with low ph lotion matching skin ph to limit infection risk Simple Self-MLD 1 x daily During Intensive Phase CDT: multilayer compression wraps from foot to groin using gradient techniques, one limb at a time  to ensure safety During Self -Management Phase CDT: Fit with custom daytime compression garments: Elvarex, custom, flat knit, ccl 2 ( 23-32 mmHg) knee highs. Consider pairing with Compreshorts Capri length pantyhose for containment of buttocks, thighs and abdomen.  Fit with BLE knee length Jobst RELAX w/ open toe and zipper to limit ongoing tissue fibrosis formation and facilitate increased lymphatic function during HOS  Custom-made gradient compression garments and  HOS devices are medically necessary in this case because they are uniquely sized and shaped to fit the exact dimensions of the affected extremities, and to provide accurate and consistent gradient compression essential for optimally managing this patient's symptoms of chronic, progressive lymphedema.  Multiple custom compression garments are needed for optimal hygiene. Custom compression garments should be replaced q 3-6 months When worn consistently for optimal effectiveness.  ASSESSMENT: Pt tolerated MLD to RLE today without increased pain. Denser, fibrotic tissue at distal leg becoming more visible, palpable and is mildly tender with MLD. As edema volume reduces tissue changes become more apparent typically. Wraps applied as establlished without increased pain. Cont as per POC.   OBJECTIVE IMPAIRMENTS: Abnormal gait, decreased activity tolerance, decreased balance, decreased knowledge of condition, decreased knowledge of use of DME, decreased mobility, difficulty walking, decreased ROM, increased edema, impaired sensation, obesity, pain, and chronic , progressive swelling and pain of buttocks, hips, abdomen and legs .   ACTIVITY LIMITATIONS: carrying, lifting, bending, sitting, standing, squatting, sleeping, stairs, transfers, bed mobility, bathing, dressing, hygiene/grooming, caring for others, and leisure pursuits, social participation, productive/ work activities  PARTICIPATION LIMITATIONS: meal prep, cleaning, laundry, interpersonal relationship, driving, shopping, community activity, occupation, yard work, and church  PERSONAL FACTORS: Age, Past/current experiences, hx of panic attacks and claustrophobia, Time since onset of injury/illness/exacerbation,, motivation, supportive spouse and family are also affecting patient's functional outcome.   REHAB POTENTIAL: Fair Unfortunately the Lipedema does not respond to CDT and is unchanged by this protocol. Fortunately lymphedema  does respond to differing degrees based on the quality and amount of fatty fibrosis present obstructing lymphatics, and patient's tolerance if skin is hypersensitive.   EVALUATION COMPLEXITY: Moderate   GOALS: Goals reviewed with patient? Yes  SHORT TERM GOALS: Target date: 4th OT Rx visit   Pt will demonstrate  understanding of lymphedema precautions and prevention strategies with modified independence using a printed reference to identify at least 5 precautions and discussing how s/he may implement them into daily life to reduce risk of progression with modified assistance ( printed reference). Baseline: Max A Goal status: 12/12/22 PROGRESSING 01/11/23 GOAL MET  2.  Pt will be able to apply multilayer, thigh length, compression wraps using gradient techniques with Max caregiver assistance to decrease limb volume, to limit infection risk, and to limit lymphedema progression.  Baseline: Dependent Goal status: 11/10/22 Goal Met  LONG TERM GOALS: Target date: 01/11/23  Given this patient's Intake score of 36.76 % on the Lymphedema Life Impact Scale (LLIS), patient will experience a reduction of at least 5 points in her perceived level of functional impairment resulting from lymphedema to improve functional performance and quality of life (QOL). Baseline: 36.76% Goal status: 12/12/22 PROGRESSING  2.  Given this patient's Intake score of 56/100% on the functional outcomes FOTO tool, patient will experience an increase in function of 3 points to improve basic and instrumental ADLs performance, including lymphedema self-care.  Baseline: 56% Goal status: 12/12/22 PROGRESSING  3.  During Intensive phase CDT Pt will achieve at least 85% compliance with all lymphedema self-care home program components, including  daily skin care, multilayer , gradient compression wraps with daily changes, daily simple self MLD and daily lymphatic pumping therex to achieve optimal clinical outcome and to habituate self care regime for optimal LE self-management over time. Baseline: Dependent Goal status: 01/09/23 GOAL MET  4.  Pt will achieve at least a 10% volume reductions bilaterally below the knees to return limb to more typical size and shape, to  limit infection risk and LE progression, to decrease pain, to improve function, and to  improve body image and QOL. Baseline: Dependent Goal status: 12/12/22 PROGRESSING. 01/11/23 GOAL MET for L LEG  5.  Pt will be able to don and doff appropriate compression garments and devices using assistive devices and extra time within 1 week of issue date for optimal lymphedema self-care. Baseline: Dependent Goal status: 01/25/23 GOAL MET   PLAN:  PT FREQUENCY: 2x/week  PT DURATION: 12  weeks other: and PRN  PLANNED INTERVENTIONS: Therapeutic exercises, Therapeutic activity, Patient/Family education, Self Care, DME instructions, Manual lymph drainage, Compression bandaging, Manual therapy, and skin care during MLD, fit with appropriate custom compression garments and devices, fit with appropriate compression device  which  follows lymphatic pathways and anatomic distribution  PLAN FOR NEXT SESSION:  MLD Compression Skin care Pt/ family edu for LE self care   Loel Dubonnet, MS, OTR/L, CLT-LANA 02/13/23 4:07 PM

## 2023-02-14 ENCOUNTER — Encounter: Payer: Self-pay | Admitting: Physician Assistant

## 2023-02-14 ENCOUNTER — Ambulatory Visit: Payer: Self-pay | Admitting: Physician Assistant

## 2023-02-14 VITALS — BP 158/85 | HR 71 | Resp 14

## 2023-02-14 DIAGNOSIS — F411 Generalized anxiety disorder: Secondary | ICD-10-CM

## 2023-02-14 MED ORDER — ALPRAZOLAM 0.5 MG PO TABS: 0.5000 mg | ORAL_TABLET | Freq: Two times a day (BID) | ORAL | 0 refills | Status: DC | PRN

## 2023-02-14 NOTE — Progress Notes (Signed)
   Subjective: Anxiety    Patient ID: Mary [name], female    DOB: [birth year], 62 y.o.   MRN: [id number]  HPI Patient presents for increased anxiety secondary to MRI findings of the brain.  There are 2 small nonspecific hypertensive remote insults within the right parietal and left frontal lobe white matter of the brain.  Patient is asymptomatic except for loss of taste and smell which do not correlate with the findings on the MRI.  Patient is asymptomatic when questioned about memory problems, balance issues, depression, incontinence, or ambulation.  Patient states increased anxiety and insomnia secondary to findings in her continued problem lymphedema.  Patient states she has to wake up around 2 or 3:00 in the morning and removed the dressings of her lower extremity secondary to anxiety.   Review of Systems Hypoglycemia and hypothyroidism.  Hyperlipidemia and hypertension.  L.  Obstructive sleep apnea, insomnia, and lipidemia.  Panic attacks.    Objective:   Physical Exam BP 186/106 158/85  Pulse 80 71  Resp 16 14  Patient initially was very anxious with elevated blood pressure.   Exam was deferred.  Spent approximately 20 to 25 minutes discussing MRI results.  Deferred diagnosis to treating neurologist.  Patient blood pressure decreased to 158/85.     Assessment & Plan: Anxiety  Patient blood pressure decrease after conversation and alleviating also her concerns for acute process going on in her brain.  Patient given a prescription for Xanax and advised to follow-up in 2 days.

## 2023-02-14 NOTE — Progress Notes (Signed)
Pt states she having high anxiety very overwhelmed as she explains MRI results and waiting for nero apt in September. Pt requesting help on coping until her apt or short term.  Pt will explain in details in room.

## 2023-02-15 ENCOUNTER — Ambulatory Visit: Payer: 59 | Admitting: Occupational Therapy

## 2023-02-15 ENCOUNTER — Encounter: Payer: Self-pay | Admitting: Occupational Therapy

## 2023-02-15 DIAGNOSIS — I890 Lymphedema, not elsewhere classified: Secondary | ICD-10-CM

## 2023-02-15 NOTE — Therapy (Signed)
OUTPATIENT OCCUPATIONAL THERAPY TREATMENT NOTE LOWER EXTREMITY LYMPHEDEMA  Patient Name: Mary [name] MRN: [id number] DOB:Aug 29, 1960, 62 y.o., female Today's Date: 02/15/2023    END OF SESSION:  OT End of Session - 02/15/23 1510     Visit Number 29    Number of Visits 36    Date for OT Re-Evaluation 04/24/23    OT Start Time 0305    Activity Tolerance Patient tolerated treatment well;No increased pain    Behavior During Therapy WFL for tasks assessed/performed                 Past Medical History:  Diagnosis Date   Anemia    low iron at times   Arthritis    Colon polyps    Complication of anesthesia 1991   Pt reports she awoke during plantar fascia surgery and felt "everything"   Heart murmur    " small per patient"    Hyperlipidemia    Hypertension    Hypothyroidism    Lactose intolerance    PAC (premature atrial contraction)    Palpitations    Panic attacks    Pt reports restrictive clothing/areas and dry mouth cause her to have attack.   Peripheral vascular disease (HCC)    "small" clot after thermal ablasion   Pneumonia 2016   "walking pneumonia" 3 times in 2016   Shortness of breath    going upstairs (due to weight). not so much since weight loss   Sleep apnea    after weight loss does not have to use cpap   Vitamin B12 deficiency    Vitamin D deficiency    Past Surgical History:  Procedure Laterality Date   APPENDECTOMY  1985   CESAREAN SECTION  1987   twins   COLONOSCOPY     COLONOSCOPY WITH PROPOFOL N/A 02/27/2018   Procedure: COLONOSCOPY WITH PROPOFOL;  Surgeon: Toledo, Boykin Nearing, MD;  Location: [facility];  Service: Gastroenterology;  Laterality: N/A;   IRRIGATION AND DEBRIDEMENT HEMATOMA Right 11/12/2015   Procedure: IRRIGATION AND DEBRIDEMENT HEMATOMA;  Surgeon: Earline Mayotte, MD;  Location: [facility];  Service: General;  Laterality: Right;   KNEE ARTHROSCOPY WITH MEDIAL MENISECTOMY Left 07/15/2020   Procedure: Left knee arthroscopy  with partial medial or lateral menisectomy, possible chondroplasty, possible partial synovectomy;  Surgeon: Lyndle Herrlich, MD;  Location: [facility];  Service: Orthopedics;  Laterality: Left;   LAMINECTOMY  07/10/2013   L4   L5   DR Yetta Barre   LAPAROSCOPIC SALPINGO OOPHERECTOMY Right 04/17/2014   Procedure: LAPAROSCOPIC SALPINGO OOPHORECTOMY RIGHT;  Surgeon: Adolphus Birchwood, MD;  Location: [facility];  Service: Gynecology;  Laterality: Right;   LYSIS OF ADHESION  04/17/2014   Procedure: LYSIS OF ADHESION;  Surgeon: Adolphus Birchwood, MD;  Location: [facility];  Service: Gynecology;;   MAXIMUM ACCESS (MAS)POSTERIOR LUMBAR INTERBODY FUSION (PLIF) 1 LEVEL  07/10/2013   Procedure: FOR MAXIMUM ACCESS (MAS) POSTERIOR LUMBAR INTERBODY FUSION (PLIF) LUMBAR FOUR-FIVE;  Surgeon: Tia Alert, MD;  Location: [facility];  Service: Neurosurgery;;  FOR MAXIMUM ACCESS (MAS) POSTERIOR LUMBAR INTERBODY FUSION (PLIF) LUMBAR FOUR-FIVE   OOPHORECTOMY     LSO   OPEN REDUCTION INTERNAL FIXATION (ORIF) DISTAL RADIAL FRACTURE Left 09/20/2018   Procedure: OPEN REDUCTION INTERNAL FIXATION (ORIF) DISTAL RADIAL FRACTURE, LEFT;  Surgeon: Kennedy Bucker, MD;  Location: [facility];  Service: Orthopedics;  Laterality: Left;   PLANTAR FASCIA SURGERY  1999   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   VAGINAL HYSTERECTOMY  2001  for bleeding and LSO for cyst   Patient Active Problem List   Diagnosis Date Noted   Reactive hypoglycemia 02/16/2022   Prediabetes 02/16/2022   Chronic venous insufficiency 12/23/2021   Lymphedema 12/13/2021   Panic attacks    Strain of knee 12/24/2019   Vitamin B12 deficiency 10/08/2019   Arthritis 07/16/2019   Edema 07/16/2019   History of abdominal pain 07/16/2019   Low back pain 07/16/2019   Vitamin D deficiency 07/16/2019   Hypothyroidism 02/28/2019   History of gastric bypass 02/28/2019   Obstructive sleep apnea syndrome 02/28/2019   Insomnia 02/28/2019   Class 3 severe obesity due to excess calories with serious  comorbidity and body mass index (BMI) of 40.0 to 44.9 in adult (HCC) 02/28/2019   Elevated BP without diagnosis of hypertension 02/28/2019   Mixed hyperlipidemia 02/28/2019   Hot flashes 02/28/2019   BMI 40.0-44.9, adult (HCC) 12/16/2015   Hematoma of lower extremity 11/12/2015   Essential hypertension 06/20/2014   Pure hypercholesterolemia 06/20/2014   S/P lumbar spinal fusion 07/10/2013   Arthrodesis status 07/10/2013   Palpitations 01/02/2012   Edema of both legs 01/02/2012    PCP: Joni Reining, PA-C  REFERRING PROVIDER: Foster Simpson, DO  REFERRING DIAG: I89.0  THERAPY DIAG:  Lymphedema, not elsewhere classified  Rationale for Evaluation and Treatment: Rehabilitation  ONSET DATE: Insidious onset with progression over time. Greater than 20 years  SUBJECTIVE:                                                                                                                                                                                          SUBJECTIVE STATEMENT:Mary [name] presents to OT visit to address BLE lipo-lymphedema. Pt is accompanied by her supportive spouse, [name]. Pt denies LE-related pain in her legs. Pt has no new concerns  PERTINENT HISTORY and contributing factors include, OA, HTN, Panic attacks 2/2 restrictive clothing, OSA (not using CPAP), s/p hysterectomy 1987, L knee sx, oophorectomy, s/p hysterectomy 2001, Gastric Bypass 2007, s/p plantar fascia sx 1999, obesity, insomnia  PAIN:  Are you having pain? No: NPRS scale: 0/10 Pain location: BLE- generalized, knees Pain description: discomfort, heavy, tired, full, sore Aggravating factors: standing, walking, extended dependent sitting Relieving factors: elevation  PRECAUTIONS: Other: LYMPHEDEMA PRECAUTIONS: HYPOTHYROID, DIABETES SKIN PRECAUTIONS  WEIGHT BEARING RESTRICTIONS: No  FALLS:  Has patient fallen since last visit? 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  BLE COMPARATIVE LIMB VOLUMETRICS:  INTAKE: 11/01/22  LANDMARK RIGHT    R LEG (A-D) 5947.2 ml  R THIGH (E-G) 9750.0 ml  R FULL LIMB (A-G) 15697.2 ml  Limb Volume differential (LVD)  %  Volume change since initial %  Volume change overall V  (Blank rows = not tested)  LANDMARK LEFT   L LEG (A-D) 6011.6 ml  L THIGH (E-G) 9602.5 ml  L FULL LIMB (A-G) 15614.1 ml  Limb Volume differential (LVD)  LEG LVD = 1.8%, L>R % THIGH LVD = 1.5%, R>L FULL LIMB LVD = 0.53%, R>L  Volume change since initial %  Volume change overall %  (Blank rows = not tested)   9th VISIT 11/24/22  LANDMARK LEFT   L LEG (A-D) 5918.6 ml  L THIGH (E-G) 9265.5 ml  L FULL LIMB (A-G) 15184.1  ml  Limb Volume differential (LVD)  LEG volume = Decreased 1.5%;  THIGH volume decreased 3.5 %, AND  L FULL LIMB VOLUME dec BY 2.8 % SINCE 11/01/22  Volume change since initial %  Volume change overall %    20 th VISIT 01/11/23  LANDMARK LEFT   L LEG (A-D) 5181.2 ml  L THIGH (E-G)  ml  L FULL LIMB (A-G)  ml  Limb Volume differential (LVD)  L LEG (A-D) volume = Decreased 12.4% since 11/24/22. 10% GOAL MET. Did not measure L thigh or full limb today since we have not been bandaging above the knee.   Volume change since initial L LEG (A-D) volume reduction since commencing CDT on 09/02/22 measures 13.8% 10% GOAL MET.    TODAY'S TREATMENT:     MLD to RLE/RLQ as established. Simultaneous skin care to limit infection. 2. RLE multilayer , knee length gradient compression  bandaging  PATIENT EDUCATION: Skilled OT instruction in LE self-care, including custom compression garments, or alternatives, wear and care regimes, garment  positioning, contraindications. Reviewed donning and doffing garments using assistive devices .  Lymphedema management has a high burden of care, especially for Patients who have difficulty, or who are unable to reach their distal legs and feet.  reach their feet and distal legs to apply bandages, compression garments, to bathe , inspect skin and groom nails. In this case because Pt is unable to perform these activities, daily caregiver assistance with the lymphedema self-care home program  between OT visits is essential for achieving clinical success.  Person educated: Patient and Spouse Education method: Explanation, Demonstration, and Handouts Education comprehension: verbalized understanding, returned demonstration, and needs further education  HOME EXERCISE PROGRAM: Lymphatic Pumping Therex-  1 set of 10, 2 x daily, in order, seated  Daily skin inspection and care with low ph lotion matching skin ph to limit infection risk Simple Self-MLD 1 x daily During Intensive Phase CDT: multilayer compression wraps from foot to groin using gradient techniques, one limb at a time  to ensure safety During Self -Management Phase CDT: Fit with custom daytime compression garments: Elvarex, custom, flat knit, ccl 2 ( 23-32 mmHg) knee highs. Consider pairing with Compreshorts Capri length pantyhose for containment of buttocks, thighs and abdomen.  Fit with BLE knee length Jobst RELAX w/ open toe and zipper to limit ongoing tissue fibrosis formation and facilitate increased lymphatic function during HOS  Custom-made gradient compression garments and HOS devices are medically necessary in this case because they are uniquely sized and shaped to  fit the exact dimensions of the affected extremities, and to provide accurate and consistent gradient compression essential for optimally managing this patient's symptoms of chronic, progressive lymphedema. Multiple custom compression garments are needed for optimal hygiene.  Custom compression garments should be replaced q 3-6 months When worn consistently for optimal effectiveness.  ASSESSMENT:  Pt continues to demonstrate progress towards all OT goals for LE care. Pt tolerated MLD to RLE today without increased pain. Denser, fibrotic tissue at distal leg becoming more visible, palpable and is mildly tender with MLD. As edema volume reduces tissue changes become more apparent typically. Wraps applied as establlished without increased pain. Cont as per POC.   OBJECTIVE IMPAIRMENTS: Abnormal gait, decreased activity tolerance, decreased balance, decreased knowledge of condition, decreased knowledge of use of DME, decreased mobility, difficulty walking, decreased ROM, increased edema, impaired sensation, obesity, pain, and chronic , progressive swelling and pain of buttocks, hips, abdomen and legs .   ACTIVITY LIMITATIONS: carrying, lifting, bending, sitting, standing, squatting, sleeping, stairs, transfers, bed mobility, bathing, dressing, hygiene/grooming, caring for others, and leisure pursuits, social participation, productive/ work activities  PARTICIPATION LIMITATIONS: meal prep, cleaning, laundry, interpersonal relationship, driving, shopping, community activity, occupation, yard work, and church  PERSONAL FACTORS: Age, Past/current experiences, hx of panic attacks and claustrophobia, Time since onset of injury/illness/exacerbation,, motivation, supportive spouse and family are also affecting patient's functional outcome.   REHAB POTENTIAL: Fair Unfortunately the Lipedema does not respond to CDT and is unchanged by this protocol. Fortunately lymphedema  does respond to differing degrees based on the quality and amount of fatty fibrosis present obstructing lymphatics, and patient's tolerance if skin is hypersensitive.   EVALUATION COMPLEXITY: Moderate   GOALS: Goals reviewed with patient? Yes  SHORT TERM GOALS: Target date: 4th OT Rx visit   Pt will  demonstrate understanding of lymphedema precautions and prevention strategies with modified independence using a printed reference to identify at least 5 precautions and discussing how s/he may implement them into daily life to reduce risk of progression with modified assistance ( printed reference). Baseline: Max A Goal status: 12/12/22 PROGRESSING 01/11/23 GOAL MET  2.  Pt will be able to apply multilayer, thigh length, compression wraps using gradient techniques with Max caregiver assistance to decrease limb volume, to limit infection risk, and to limit lymphedema progression.  Baseline: Dependent Goal status: 11/10/22 Goal Met  LONG TERM GOALS: Target date: 01/11/23  Given this patient's Intake score of 36.76 % on the Lymphedema Life Impact Scale (LLIS), patient will experience a reduction of at least 5 points in her perceived level of functional impairment resulting from lymphedema to improve functional performance and quality of life (QOL). Baseline: 36.76% Goal status: 12/12/22 PROGRESSING  2.  Given this patient's Intake score of 56/100% on the functional outcomes FOTO tool, patient will experience an increase in function of 3 points to improve basic and instrumental ADLs performance, including lymphedema self-care.  Baseline: 56% Goal status: 12/12/22 PROGRESSING  3.  During Intensive phase CDT Pt will achieve at least 85% compliance with all lymphedema self-care home program components, including  daily skin care, multilayer , gradient compression wraps with daily changes, daily simple self MLD and daily lymphatic pumping therex to achieve optimal clinical outcome and to habituate self care regime for optimal LE self-management over time. Baseline: Dependent Goal status: 01/09/23 GOAL MET  4.  Pt will achieve at least a 10% volume reductions bilaterally below the knees to return limb to more typical size and shape, to limit infection risk  and LE progression, to decrease pain, to improve function,  and to improve body image and QOL. Baseline: Dependent Goal status: 12/12/22 PROGRESSING. 01/11/23 GOAL MET for L LEG  5.  Pt will be able to don and doff appropriate compression garments and devices using assistive devices and extra time within 1 week of issue date for optimal lymphedema self-care. Baseline: Dependent Goal status: 01/25/23 GOAL MET   PLAN:  PT FREQUENCY: 2x/week  PT DURATION: 12  weeks other: and PRN  PLANNED INTERVENTIONS: Therapeutic exercises, Therapeutic activity, Patient/Family education, Self Care, DME instructions, Manual lymph drainage, Compression bandaging, Manual therapy, and skin care during MLD, fit with appropriate custom compression garments and devices, fit with appropriate compression device  which  follows lymphatic pathways and anatomic distribution  PLAN FOR NEXT SESSION:  MLD Compression Skin care Pt/ family edu for LE self care   Loel Dubonnet, MS, OTR/L, CLT-LANA 02/15/23 3:16 PM

## 2023-02-16 ENCOUNTER — Ambulatory Visit: Payer: Self-pay | Admitting: Physician Assistant

## 2023-02-16 ENCOUNTER — Encounter: Payer: Self-pay | Admitting: Physician Assistant

## 2023-02-16 VITALS — BP 142/81 | HR 90

## 2023-02-16 DIAGNOSIS — F411 Generalized anxiety disorder: Secondary | ICD-10-CM

## 2023-02-16 NOTE — Progress Notes (Signed)
Pt presents today for BP follow up. Pt states she's doing better then previous visit. Pt also states last night she finally slept a full night out of 4 weeks.

## 2023-02-16 NOTE — Progress Notes (Signed)
   Subjective: Anxiety    Patient ID: Mary [name], female    DOB: 12/25/1960, 62 y.o.   MRN: [id number]  HPI  Patient is follow-up for 2 days status post increased anxiety secondary to abnormal brain MRI findings.  Patient was placed on Xanax 0.5 mg twice daily.  Patient states for the first time since she received the reports he has been able to sleep without waking up and overnight.  Patient is scheduled to see a neurologist in 1 month for definitive evaluation and treatment of the findings of the MRI. Review of Systems Hypertension, hypothyroidism, lymphedema, obstructive sleep apnea, and reactive hypoglycemia.    Objective:   Physical Exam BP 142/81  Pulse 90  Patient vital signs are greatly improved since her last visit.  Patient appears more calm and relaxed.  Physical exam was deferred.       Assessment & Plan: Anxiety  Patient advised to continue taking Xanax until evaluation by psychiatrist next month.  Return back to clinic if condition worsens.

## 2023-02-20 ENCOUNTER — Ambulatory Visit: Payer: 59 | Admitting: Occupational Therapy

## 2023-02-20 DIAGNOSIS — I890 Lymphedema, not elsewhere classified: Secondary | ICD-10-CM

## 2023-02-20 NOTE — Therapy (Unsigned)
OUTPATIENT OCCUPATIONAL THERAPY TREATMENT NOTE AND PROGRESS REPORT LOWER EXTREMITY LYMPHEDEMA  Patient Name: Mary [name] MRN: [id number] DOB:1961/05/19, 62 y.o., female Today's Date: 02/21/2023  REPORTING PERIOD: 01/18/23 - 02/20/23  END OF SESSION:  OT End of Session - 02/20/23 1512     Visit Number 30    Number of Visits 36    Date for OT Re-Evaluation 04/24/23    OT Start Time 0305    OT Stop Time 0405    OT Time Calculation (min) 60 min    Activity Tolerance Patient tolerated treatment well;No increased pain    Behavior During Therapy WFL for tasks assessed/performed                 Past Medical History:  Diagnosis Date   Anemia    low iron at times   Arthritis    Colon polyps    Complication of anesthesia 1991   Pt reports she awoke during plantar fascia surgery and felt "everything"   Heart murmur    " small per patient"    Hyperlipidemia    Hypertension    Hypothyroidism    Lactose intolerance    PAC (premature atrial contraction)    Palpitations    Panic attacks    Pt reports restrictive clothing/areas and dry mouth cause her to have attack.   Peripheral vascular disease (HCC)    "small" clot after thermal ablasion   Pneumonia 2016   "walking pneumonia" 3 times in 2016   Shortness of breath    going upstairs (due to weight). not so much since weight loss   Sleep apnea    after weight loss does not have to use cpap   Vitamin B12 deficiency    Vitamin D deficiency    Past Surgical History:  Procedure Laterality Date   APPENDECTOMY  1985   CESAREAN SECTION  1987   twins   COLONOSCOPY     COLONOSCOPY WITH PROPOFOL N/A 02/27/2018   Procedure: COLONOSCOPY WITH PROPOFOL;  Surgeon: Toledo, Boykin Nearing, MD;  Location: [facility];  Service: Gastroenterology;  Laterality: N/A;   IRRIGATION AND DEBRIDEMENT HEMATOMA Right 11/12/2015   Procedure: IRRIGATION AND DEBRIDEMENT HEMATOMA;  Surgeon: Earline Mayotte, MD;  Location: [facility];  Service: General;   Laterality: Right;   KNEE ARTHROSCOPY WITH MEDIAL MENISECTOMY Left 07/15/2020   Procedure: Left knee arthroscopy with partial medial or lateral menisectomy, possible chondroplasty, possible partial synovectomy;  Surgeon: Lyndle Herrlich, MD;  Location: [facility];  Service: Orthopedics;  Laterality: Left;   LAMINECTOMY  07/10/2013   L4   L5   DR Yetta Barre   LAPAROSCOPIC SALPINGO OOPHERECTOMY Right 04/17/2014   Procedure: LAPAROSCOPIC SALPINGO OOPHORECTOMY RIGHT;  Surgeon: Adolphus Birchwood, MD;  Location: [facility];  Service: Gynecology;  Laterality: Right;   LYSIS OF ADHESION  04/17/2014   Procedure: LYSIS OF ADHESION;  Surgeon: Adolphus Birchwood, MD;  Location: [facility];  Service: Gynecology;;   MAXIMUM ACCESS (MAS)POSTERIOR LUMBAR INTERBODY FUSION (PLIF) 1 LEVEL  07/10/2013   Procedure: FOR MAXIMUM ACCESS (MAS) POSTERIOR LUMBAR INTERBODY FUSION (PLIF) LUMBAR FOUR-FIVE;  Surgeon: Tia Alert, MD;  Location: [facility];  Service: Neurosurgery;;  FOR MAXIMUM ACCESS (MAS) POSTERIOR LUMBAR INTERBODY FUSION (PLIF) LUMBAR FOUR-FIVE   OOPHORECTOMY     LSO   OPEN REDUCTION INTERNAL FIXATION (ORIF) DISTAL RADIAL FRACTURE Left 09/20/2018   Procedure: OPEN REDUCTION INTERNAL FIXATION (ORIF) DISTAL RADIAL FRACTURE, LEFT;  Surgeon: Kennedy Bucker, MD;  Location: [facility];  Service: Orthopedics;  Laterality: Left;   PLANTAR FASCIA SURGERY  1999   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   VAGINAL HYSTERECTOMY  2001   for bleeding and LSO for cyst   Patient Active Problem List   Diagnosis Date Noted   Reactive hypoglycemia 02/16/2022   Prediabetes 02/16/2022   Chronic venous insufficiency 12/23/2021   Lymphedema 12/13/2021   Panic attacks    Strain of knee 12/24/2019   Vitamin B12 deficiency 10/08/2019   Arthritis 07/16/2019   Edema 07/16/2019   History of abdominal pain 07/16/2019   Low back pain 07/16/2019   Vitamin D deficiency 07/16/2019   Hypothyroidism 02/28/2019   History of gastric bypass 02/28/2019   Obstructive sleep  apnea syndrome 02/28/2019   Insomnia 02/28/2019   Class 3 severe obesity due to excess calories with serious comorbidity and body mass index (BMI) of 40.0 to 44.9 in adult (HCC) 02/28/2019   Elevated BP without diagnosis of hypertension 02/28/2019   Mixed hyperlipidemia 02/28/2019   Hot flashes 02/28/2019   BMI 40.0-44.9, adult (HCC) 12/16/2015   Hematoma of lower extremity 11/12/2015   Essential hypertension 06/20/2014   Pure hypercholesterolemia 06/20/2014   S/P lumbar spinal fusion 07/10/2013   Arthrodesis status 07/10/2013   Palpitations 01/02/2012   Edema of both legs 01/02/2012    PCP: Joni Reining, PA-C  REFERRING PROVIDER: Foster Simpson, DO  REFERRING DIAG: I89.0  THERAPY DIAG:  Lymphedema, not elsewhere classified  Rationale for Evaluation and Treatment: Rehabilitation  ONSET DATE: Insidious onset with progression over time. Greater than 20 years  SUBJECTIVE:                                                                                                                                                                                          SUBJECTIVE STATEMENT:[name] presents to OT visit to address BLE lipo-lymphedema. Pt is accompanied by her supportive spouse, Mary [name]. Pt denies LE-related pain in her legs. Pt has no new concerns. Pt's second LLE compression stocking arrived for fitting. Pt states she hopes the lighter color will be less obtrusive with Capri length pants she wears in summer.  PERTINENT HISTORY and contributing factors include, OA, HTN, Panic attacks 2/2 restrictive clothing, OSA (not using CPAP), s/p hysterectomy 1987, L knee sx, oophorectomy, s/p hysterectomy 2001, Gastric Bypass 2007, s/p plantar fascia sx 1999, obesity, insomnia  PAIN:  Are you having pain? No: NPRS scale: 0/10 Pain location: BLE- generalized, knees Pain description: discomfort, heavy, tired, full, sore Aggravating factors: standing, walking, extended dependent  sitting Relieving factors: elevation  PRECAUTIONS: Other: LYMPHEDEMA PRECAUTIONS: HYPOTHYROID, DIABETES SKIN PRECAUTIONS  WEIGHT BEARING RESTRICTIONS: No  FALLS:  Has patient fallen since last visit? 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  BLE COMPARATIVE LIMB VOLUMETRICS:  INTAKE: 11/01/22  LANDMARK RIGHT    R LEG (A-D) 5947.2 ml  R THIGH (E-G) 9750.0 ml  R FULL LIMB (A-G) 15697.2 ml  Limb Volume differential (LVD)  %  Volume change since initial %  Volume change overall V  (Blank rows = not tested)  LANDMARK LEFT   L LEG (A-D) 6011.6 ml  L THIGH (E-G) 9602.5 ml  L FULL LIMB (A-G) 15614.1 ml  Limb Volume differential (LVD)  LEG LVD = 1.8%, L>R % THIGH LVD = 1.5%, R>L FULL LIMB LVD = 0.53%, R>L  Volume change since initial %  Volume change overall %  (Blank rows = not tested)   9th VISIT 11/24/22  LANDMARK LEFT   L LEG (A-D) 5918.6 ml  L THIGH (E-G) 9265.5 ml  L FULL LIMB (A-G) 15184.1  ml  Limb Volume differential (LVD)  LEG volume = Decreased 1.5%;  THIGH volume decreased 3.5 %, AND  L FULL LIMB VOLUME dec BY 2.8 % SINCE 11/01/22  Volume change since initial %  Volume change overall %    20 th VISIT 01/11/23  LANDMARK LEFT   L LEG (A-D) 5181.2 ml  L THIGH (E-G)  ml  L FULL LIMB (A-G)  ml  Limb Volume differential (LVD)  L LEG (A-D) volume = Decreased 12.4% since 11/24/22. 10% GOAL MET. Did not measure L thigh or full limb today since we have not been bandaging above the knee.   Volume change since initial L LEG (A-D) volume reduction since commencing CDT on 09/02/22 measures 13.8% 10% GOAL MET.   30 th VISIT 01/11/23  LANDMARK RLE  L LEG  (A-D) 5691.5  ml  L THIGH (E-G)  ml  L FULL LIMB (A-G)  ml  Limb Volume since initial 11/01/22 DECREASED BY 4.3%  Volume change overall     TODAY'S TREATMENT:     Rle COMPARATIVE LIMB VOLUMETRICS for progress report MLD to RLE/RLQ as established. Simultaneous skin care to limit infection. 3. RLE multilayer , knee length gradient compression  bandaging 4. Pt/ FAMILY EDU  PATIENT EDUCATION:  Continued Pt/ CG edu for lymphedema self care home program throughout session. Topics include outcome of comparative limb volumetrics- starting vs changing limb volumes over time; technology and gradient techniques used for short stretch, multilayer compression wrapping, simple self-MLD, therapeutic lymphatic pumping exercises, skin/nail care, LE precautions,. compression garment recommendations and specifications, donning and doffing gadgets, wear and care schedule and compression garment donning / doffing w assistive devices. Discussed progress towards all OT goals since commencing CDT. All questions answered to the Pt's satisfaction w comfort and fit. Good return. Person educated: Patient and Spouse Education method: Explanation, Demonstration, and Handouts Education comprehension: verbalized understanding, returned demonstration, verbal cues required, and needs further education   HOME EXERCISE PROGRAM: Lymphatic Pumping Therex-  1 set of 10, 2 x daily, in order, seated  Daily skin inspection and care with low ph lotion matching skin ph to limit infection risk Simple Self-MLD 1 x daily During Intensive Phase CDT: multilayer compression wraps from foot to groin  using gradient techniques, one limb at a time  to ensure safety During Self -Management Phase CDT: Fit with custom daytime compression garments: Elvarex, custom, flat knit, ccl 2 ( 23-32 mmHg) knee highs. Consider pairing with Compreshorts Capri length pantyhose for containment of buttocks, thighs and abdomen.  Fit with BLE knee length Jobst RELAX  w/ open toe and zipper to limit ongoing tissue fibrosis formation and facilitate increased lymphatic function during HOS  Custom-made gradient compression garments and HOS devices are medically necessary in this case because they are uniquely sized and shaped to fit the exact dimensions of the affected extremities, and to provide accurate and consistent gradient compression essential for optimally managing this patient's symptoms of chronic, progressive lymphedema. Multiple custom compression garments are needed for optimal hygiene. Custom compression garments should be replaced q 3-6 months When worn consistently for optimal effectiveness.  ASSESSMENT:  Pt demonstrates slow, but steady progress towards all OT goals. Please refer to GOALS section for progress details to date. Intensive Phase CDT is complete on LLE and we are working ibn the LLE at present. Volume reduction is slow, but Pt will be ready for custom garment new masurements soon.    OBJECTIVE IMPAIRMENTS: Abnormal gait, decreased activity tolerance, decreased balance, decreased knowledge of condition, decreased knowledge of use of DME, decreased mobility, difficulty walking, decreased ROM, increased edema, impaired sensation, obesity, pain, and chronic , progressive swelling and pain of buttocks, hips, abdomen and legs .   ACTIVITY LIMITATIONS: carrying, lifting, bending, sitting, standing, squatting, sleeping, stairs, transfers, bed mobility, bathing, dressing, hygiene/grooming, caring for others, and leisure pursuits, social participation, productive/ work activities  PARTICIPATION LIMITATIONS: meal prep, cleaning, laundry, interpersonal relationship, driving, shopping, community activity, occupation, yard work, and church  PERSONAL FACTORS: Age, Past/current experiences, hx of panic attacks and claustrophobia, Time since onset of injury/illness/exacerbation,, motivation, supportive spouse and family are also affecting patient's  functional outcome.   REHAB POTENTIAL: Fair Unfortunately the Lipedema does not respond to CDT and is unchanged by this protocol. Fortunately lymphedema  does respond to differing degrees based on the quality and amount of fatty fibrosis present obstructing lymphatics, and patient's tolerance if skin is hypersensitive.   EVALUATION COMPLEXITY: Moderate   GOALS: Goals reviewed with patient? Yes  SHORT TERM GOALS: Target date: 4th OT Rx visit   Pt will demonstrate understanding of lymphedema precautions and prevention strategies with modified independence using a printed reference to identify at least 5 precautions and discussing how s/he may implement them into daily life to reduce risk of progression with modified assistance ( printed reference). Baseline: Max A Goal status: 12/12/22 PROGRESSING 01/11/23 GOAL MET  2.  Pt will be able to apply multilayer, thigh length, compression wraps using gradient techniques with Max caregiver assistance to decrease limb volume, to limit infection risk, and to limit lymphedema progression.  Baseline: Dependent Goal status: 11/10/22 Goal Met  LONG TERM GOALS: Target date: 04/24/23  Given this patient's Intake score of 36.76 % on the Lymphedema Life Impact Scale (LLIS), patient will experience a reduction of at least 5 points in her perceived level of functional impairment resulting from lymphedema to improve functional performance and quality of life (QOL). Baseline: 36.76% Goal status: 02/20/23 PROGRESSING  2.  Given this patient's Intake score of 56/100% on the functional outcomes FOTO tool, patient will experience an increase in function of 3 points to improve basic and instrumental ADLs performance, including lymphedema self-care.  Baseline: 56% Goal status: 02/20/23 PROGRESSING  3.  During Intensive phase  CDT Pt will achieve at least 85% compliance with all lymphedema self-care home program components, including  daily skin care, multilayer , gradient  compression wraps with daily changes, daily simple self MLD and daily lymphatic pumping therex to achieve optimal clinical outcome and to habituate self care regime for optimal LE self-management over time. Baseline: Dependent Goal status: 01/09/23 GOAL MET  4.  Pt will achieve at least a 10% volume reductions bilaterally below the knees to return limb to more typical size and shape, to limit infection risk and LE progression, to decrease pain, to improve function, and to improve body image and QOL. Baseline: Dependent Goal status: 12/12/22 PROGRESSING. 01/11/23 GOAL MET for L LEG  5.  Pt will be able to don and doff appropriate compression garments and devices using assistive devices and extra time within 1 week of issue date for optimal lymphedema self-care. Baseline: Dependent Goal status: 01/25/23 GOAL MET   PLAN:  PT FREQUENCY: 2x/week  PT DURATION: 12  weeks other: and PRN  PLANNED INTERVENTIONS: Therapeutic exercises, Therapeutic activity, Patient/Family education, Self Care, DME instructions, Manual lymph drainage, Compression bandaging, Manual therapy, and skin care during MLD, fit with appropriate custom compression garments and devices, fit with appropriate compression device  which  follows lymphatic pathways and anatomic distribution  PLAN FOR NEXT SESSION:  MLD Compression Skin care Pt/ family edu for LE self care   Loel Dubonnet, MS, OTR/L, CLT-LANA 02/21/23 11:57 AM

## 2023-02-21 ENCOUNTER — Encounter: Payer: Self-pay | Admitting: Occupational Therapy

## 2023-02-22 ENCOUNTER — Ambulatory Visit: Payer: 59 | Admitting: Occupational Therapy

## 2023-02-22 DIAGNOSIS — I890 Lymphedema, not elsewhere classified: Secondary | ICD-10-CM | POA: Diagnosis not present

## 2023-02-22 NOTE — Therapy (Signed)
OUTPATIENT OCCUPATIONAL THERAPY TREATMENT NOTE LOWER EXTREMITY LYMPHEDEMA  Patient Name: Mary [name] MRN: [id number] DOB:April 16, 1961, 62 y.o., female Today's Date: 02/22/2023   END OF SESSION:  OT End of Session - 02/22/23 1506     Visit Number 31    Number of Visits 36    Date for OT Re-Evaluation 04/24/23    OT Start Time 0306    OT Stop Time 0401    OT Time Calculation (min) 55 min    Activity Tolerance Patient tolerated treatment well;No increased pain    Behavior During Therapy WFL for tasks assessed/performed                 Past Medical History:  Diagnosis Date   Anemia    low iron at times   Arthritis    Colon polyps    Complication of anesthesia 1991   Pt reports she awoke during plantar fascia surgery and felt "everything"   Heart murmur    " small per patient"    Hyperlipidemia    Hypertension    Hypothyroidism    Lactose intolerance    PAC (premature atrial contraction)    Palpitations    Panic attacks    Pt reports restrictive clothing/areas and dry mouth cause her to have attack.   Peripheral vascular disease (HCC)    "small" clot after thermal ablasion   Pneumonia 2016   "walking pneumonia" 3 times in 2016   Shortness of breath    going upstairs (due to weight). not so much since weight loss   Sleep apnea    after weight loss does not have to use cpap   Vitamin B12 deficiency    Vitamin D deficiency    Past Surgical History:  Procedure Laterality Date   APPENDECTOMY  1985   CESAREAN SECTION  1987   twins   COLONOSCOPY     COLONOSCOPY WITH PROPOFOL N/A 02/27/2018   Procedure: COLONOSCOPY WITH PROPOFOL;  Surgeon: Toledo, Boykin Nearing, MD;  Location: [facility];  Service: Gastroenterology;  Laterality: N/A;   IRRIGATION AND DEBRIDEMENT HEMATOMA Right 11/12/2015   Procedure: IRRIGATION AND DEBRIDEMENT HEMATOMA;  Surgeon: Earline Mayotte, MD;  Location: [facility];  Service: General;  Laterality: Right;   KNEE ARTHROSCOPY WITH MEDIAL  MENISECTOMY Left 07/15/2020   Procedure: Left knee arthroscopy with partial medial or lateral menisectomy, possible chondroplasty, possible partial synovectomy;  Surgeon: Lyndle Herrlich, MD;  Location: [facility];  Service: Orthopedics;  Laterality: Left;   LAMINECTOMY  07/10/2013   L4   L5   DR Yetta Barre   LAPAROSCOPIC SALPINGO OOPHERECTOMY Right 04/17/2014   Procedure: LAPAROSCOPIC SALPINGO OOPHORECTOMY RIGHT;  Surgeon: Adolphus Birchwood, MD;  Location: [facility];  Service: Gynecology;  Laterality: Right;   LYSIS OF ADHESION  04/17/2014   Procedure: LYSIS OF ADHESION;  Surgeon: Adolphus Birchwood, MD;  Location: [facility];  Service: Gynecology;;   MAXIMUM ACCESS (MAS)POSTERIOR LUMBAR INTERBODY FUSION (PLIF) 1 LEVEL  07/10/2013   Procedure: FOR MAXIMUM ACCESS (MAS) POSTERIOR LUMBAR INTERBODY FUSION (PLIF) LUMBAR FOUR-FIVE;  Surgeon: Tia Alert, MD;  Location: [facility];  Service: Neurosurgery;;  FOR MAXIMUM ACCESS (MAS) POSTERIOR LUMBAR INTERBODY FUSION (PLIF) LUMBAR FOUR-FIVE   OOPHORECTOMY     LSO   OPEN REDUCTION INTERNAL FIXATION (ORIF) DISTAL RADIAL FRACTURE Left 09/20/2018   Procedure: OPEN REDUCTION INTERNAL FIXATION (ORIF) DISTAL RADIAL FRACTURE, LEFT;  Surgeon: Kennedy Bucker, MD;  Location: [facility];  Service: Orthopedics;  Laterality: Left;   PLANTAR FASCIA SURGERY  1999   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   VAGINAL HYSTERECTOMY  2001   for bleeding and LSO for cyst   Patient Active Problem List   Diagnosis Date Noted   Reactive hypoglycemia 02/16/2022   Prediabetes 02/16/2022   Chronic venous insufficiency 12/23/2021   Lymphedema 12/13/2021   Panic attacks    Strain of knee 12/24/2019   Vitamin B12 deficiency 10/08/2019   Arthritis 07/16/2019   Edema 07/16/2019   History of abdominal pain 07/16/2019   Low back pain 07/16/2019   Vitamin D deficiency 07/16/2019   Hypothyroidism 02/28/2019   History of gastric bypass 02/28/2019   Obstructive sleep apnea syndrome 02/28/2019   Insomnia 02/28/2019    Class 3 severe obesity due to excess calories with serious comorbidity and body mass index (BMI) of 40.0 to 44.9 in adult (HCC) 02/28/2019   Elevated BP without diagnosis of hypertension 02/28/2019   Mixed hyperlipidemia 02/28/2019   Hot flashes 02/28/2019   BMI 40.0-44.9, adult (HCC) 12/16/2015   Hematoma of lower extremity 11/12/2015   Essential hypertension 06/20/2014   Pure hypercholesterolemia 06/20/2014   S/P lumbar spinal fusion 07/10/2013   Arthrodesis status 07/10/2013   Palpitations 01/02/2012   Edema of both legs 01/02/2012    PCP: Joni Reining, PA-C  REFERRING PROVIDER: Foster Simpson, DO  REFERRING DIAG: I89.0  THERAPY DIAG:  Lymphedema, not elsewhere classified  Rationale for Evaluation and Treatment: Rehabilitation  ONSET DATE: Insidious onset with progression over time. Greater than 20 years  SUBJECTIVE:                                                                                                                                                                                          SUBJECTIVE STATEMENT:Mary [name] presents to OT visit to address BLE lipo-lymphedema. Pt is accompanied by her supportive spouse, Mary [name]. Pt denies LE-related pain in her legs. Pt has no new concerns. Pt reports her new compression garment is fitting OK. "I haven't worn it a lot yet  but just for fit."  PERTINENT HISTORY and contributing factors include, OA, HTN, Panic attacks 2/2 restrictive clothing, OSA (not using CPAP), s/p hysterectomy 1987, L knee sx, oophorectomy, s/p hysterectomy 2001, Gastric Bypass 2007, s/p plantar fascia sx 1999, obesity, insomnia  PAIN:  Are you having pain? No: NPRS scale: 0/10 Pain location: BLE- generalized, knees Pain description: discomfort, heavy, tired, full, sore Aggravating factors: standing, walking, extended dependent sitting Relieving factors: elevation  PRECAUTIONS: Other: LYMPHEDEMA PRECAUTIONS: HYPOTHYROID, DIABETES SKIN  PRECAUTIONS  WEIGHT BEARING RESTRICTIONS: No  FALLS:  Has patient fallen since last visit? 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  BLE COMPARATIVE LIMB VOLUMETRICS:  INTAKE: 11/01/22  LANDMARK RIGHT    R LEG (A-D) 5947.2 ml  R THIGH (E-G) 9750.0 ml  R FULL LIMB (A-G) 15697.2 ml  Limb Volume differential (LVD)  %  Volume change since initial %  Volume change overall V  (Blank rows = not tested)  LANDMARK LEFT   L LEG (A-D) 6011.6 ml  L THIGH (E-G) 9602.5 ml  L FULL LIMB (A-G) 15614.1 ml  Limb Volume differential (LVD)  LEG LVD = 1.8%, L>R % THIGH LVD = 1.5%, R>L FULL LIMB LVD = 0.53%, R>L  Volume change since initial %  Volume change overall %  (Blank rows = not tested)   9th VISIT 11/24/22  LANDMARK LEFT   L LEG (A-D) 5918.6 ml  L THIGH (E-G) 9265.5 ml  L FULL LIMB (A-G) 15184.1  ml  Limb Volume differential (LVD)  LEG volume = Decreased 1.5%;  THIGH volume decreased 3.5 %, AND  L FULL LIMB VOLUME dec BY 2.8 % SINCE 11/01/22  Volume change since initial %  Volume change overall %    20 th VISIT 01/11/23  LANDMARK LEFT   L LEG (A-D) 5181.2 ml  L THIGH (E-G)  ml  L FULL LIMB (A-G)  ml  Limb Volume differential (LVD)  L LEG (A-D) volume = Decreased 12.4% since 11/24/22. 10% GOAL MET. Did not measure L thigh or full limb today since we have not been bandaging above the knee.   Volume change since initial L LEG (A-D) volume reduction since commencing CDT on 09/02/22 measures 13.8% 10% GOAL MET.   30 th VISIT 01/11/23  LANDMARK RLE  L LEG (A-D) 5691.5  ml  L THIGH (E-G)  ml  L FULL LIMB (A-G)  ml  Limb Volume since initial 11/01/22 DECREASED BY  4.3%  Volume change overall     TODAY'S TREATMENT:     Rle COMPARATIVE LIMB VOLUMETRICS for progress report MLD to RLE/RLQ as established. Simultaneous skin care to limit infection. 3. RLE multilayer , knee length gradient compression  bandaging 4. Pt/ FAMILY EDU  PATIENT EDUCATION:  Continued Pt/ CG edu for lymphedema self care home program throughout session. Topics include outcome of comparative limb volumetrics- starting vs changing limb volumes over time; technology and gradient techniques used for short stretch, multilayer compression wrapping, simple self-MLD, therapeutic lymphatic pumping exercises, skin/nail care, LE precautions,. compression garment recommendations and specifications, donning and doffing gadgets, wear and care schedule and compression garment donning / doffing w assistive devices. Discussed progress towards all OT goals since commencing CDT. All questions answered to the Pt's satisfaction w comfort and fit. Good return. Person educated: Patient and Spouse Education method: Explanation, Demonstration, and Handouts Education comprehension: verbalized understanding, returned demonstration, verbal cues required, and needs further education   HOME EXERCISE PROGRAM: Lymphatic Pumping Therex-  1 set of 10, 2 x daily, in order, seated  Daily skin inspection and care with low ph lotion matching skin ph to limit infection risk Simple Self-MLD 1 x daily During Intensive Phase CDT: multilayer compression wraps from foot to groin using gradient techniques, one limb at a time  to ensure safety During Self -  Management Phase CDT: Fit with custom daytime compression garments: Elvarex, custom, flat knit, ccl 2 ( 23-32 mmHg) knee highs. Consider pairing with Compreshorts Capri length pantyhose for containment of buttocks, thighs and abdomen.  Fit with BLE knee length Jobst RELAX w/ open toe and zipper to limit ongoing tissue fibrosis formation and facilitate increased lymphatic function  during HOS  Custom-made gradient compression garments and HOS devices are medically necessary in this case because they are uniquely sized and shaped to fit the exact dimensions of the affected extremities, and to provide accurate and consistent gradient compression essential for optimally managing this patient's symptoms of chronic, progressive lymphedema. Multiple custom compression garments are needed for optimal hygiene. Custom compression garments should be replaced q 3-6 months When worn consistently for optimal effectiveness.  ASSESSMENT:  Pt demonstrates slow, but steady progress towards all OT goals. Continued MLD to RLE with simultaneous skin care for full session. Reapplied  compression wraps as established. Cont as per POC.   OBJECTIVE IMPAIRMENTS: Abnormal gait, decreased activity tolerance, decreased balance, decreased knowledge of condition, decreased knowledge of use of DME, decreased mobility, difficulty walking, decreased ROM, increased edema, impaired sensation, obesity, pain, and chronic , progressive swelling and pain of buttocks, hips, abdomen and legs .   ACTIVITY LIMITATIONS: carrying, lifting, bending, sitting, standing, squatting, sleeping, stairs, transfers, bed mobility, bathing, dressing, hygiene/grooming, caring for others, and leisure pursuits, social participation, productive/ work activities  PARTICIPATION LIMITATIONS: meal prep, cleaning, laundry, interpersonal relationship, driving, shopping, community activity, occupation, yard work, and church  PERSONAL FACTORS: Age, Past/current experiences, hx of panic attacks and claustrophobia, Time since onset of injury/illness/exacerbation,, motivation, supportive spouse and family are also affecting patient's functional outcome.   REHAB POTENTIAL: Fair Unfortunately the Lipedema does not respond to CDT and is unchanged by this protocol. Fortunately lymphedema  does respond to differing degrees based on the quality and amount  of fatty fibrosis present obstructing lymphatics, and patient's tolerance if skin is hypersensitive.   EVALUATION COMPLEXITY: Moderate   GOALS: Goals reviewed with patient? Yes  SHORT TERM GOALS: Target date: 4th OT Rx visit   Pt will demonstrate understanding of lymphedema precautions and prevention strategies with modified independence using a printed reference to identify at least 5 precautions and discussing how s/he may implement them into daily life to reduce risk of progression with modified assistance ( printed reference). Baseline: Max A Goal status: 12/12/22 PROGRESSING 01/11/23 GOAL MET  2.  Pt will be able to apply multilayer, thigh length, compression wraps using gradient techniques with Max caregiver assistance to decrease limb volume, to limit infection risk, and to limit lymphedema progression.  Baseline: Dependent Goal status: 11/10/22 Goal Met  LONG TERM GOALS: Target date: 04/24/23  Given this patient's Intake score of 36.76 % on the Lymphedema Life Impact Scale (LLIS), patient will experience a reduction of at least 5 points in her perceived level of functional impairment resulting from lymphedema to improve functional performance and quality of life (QOL). Baseline: 36.76% Goal status: 02/20/23 PROGRESSING  2.  Given this patient's Intake score of 56/100% on the functional outcomes FOTO tool, patient will experience an increase in function of 3 points to improve basic and instrumental ADLs performance, including lymphedema self-care.  Baseline: 56% Goal status: 02/20/23 PROGRESSING  3.  During Intensive phase CDT Pt will achieve at least 85% compliance with all lymphedema self-care home program components, including  daily skin care, multilayer , gradient compression wraps with daily changes, daily simple self MLD and daily lymphatic  pumping therex to achieve optimal clinical outcome and to habituate self care regime for optimal LE self-management over time. Baseline:  Dependent Goal status: 01/09/23 GOAL MET  4.  Pt will achieve at least a 10% volume reductions bilaterally below the knees to return limb to more typical size and shape, to limit infection risk and LE progression, to decrease pain, to improve function, and to improve body image and QOL. Baseline: Dependent Goal status: 12/12/22 PROGRESSING. 01/11/23 GOAL MET for L LEG  5.  Pt will be able to don and doff appropriate compression garments and devices using assistive devices and extra time within 1 week of issue date for optimal lymphedema self-care. Baseline: Dependent Goal status: 01/25/23 GOAL MET   PLAN:  PT FREQUENCY: 2x/week  PT DURATION: 12  weeks other: and PRN  PLANNED INTERVENTIONS: Therapeutic exercises, Therapeutic activity, Patient/Family education, Self Care, DME instructions, Manual lymph drainage, Compression bandaging, Manual therapy, and skin care during MLD, fit with appropriate custom compression garments and devices, fit with appropriate compression device  which  follows lymphatic pathways and anatomic distribution  PLAN FOR NEXT SESSION:  MLD Compression Skin care Pt/ family edu for LE self care   Loel Dubonnet, MS, OTR/L, CLT-LANA 02/22/23 4:02 PM

## 2023-02-23 ENCOUNTER — Other Ambulatory Visit: Payer: Self-pay | Admitting: Physician Assistant

## 2023-02-23 MED ORDER — ALPRAZOLAM 0.5 MG PO TABS: 0.5000 mg | ORAL_TABLET | Freq: Two times a day (BID) | ORAL | 0 refills | Status: DC | PRN

## 2023-02-28 ENCOUNTER — Ambulatory Visit: Payer: 59 | Admitting: Occupational Therapy

## 2023-02-28 ENCOUNTER — Encounter: Payer: Self-pay | Admitting: Occupational Therapy

## 2023-02-28 DIAGNOSIS — I890 Lymphedema, not elsewhere classified: Secondary | ICD-10-CM | POA: Diagnosis not present

## 2023-02-28 NOTE — Therapy (Signed)
OUTPATIENT OCCUPATIONAL THERAPY TREATMENT NOTE LOWER EXTREMITY LYMPHEDEMA  Patient Name: Mary [name] MRN: [id number] DOB:29-Nov-1960, 62 y.o., female Today's Date: 02/28/2023   END OF SESSION:  OT End of Session - 02/28/23 1507     Visit Number 32    Number of Visits 36    Date for OT Re-Evaluation 04/24/23    OT Start Time 0300    OT Stop Time 0400    OT Time Calculation (min) 60 min    Activity Tolerance Patient tolerated treatment well;No increased pain    Behavior During Therapy WFL for tasks assessed/performed                 Past Medical History:  Diagnosis Date   Anemia    low iron at times   Arthritis    Colon polyps    Complication of anesthesia 1991   Pt reports she awoke during plantar fascia surgery and felt "everything"   Heart murmur    " small per patient"    Hyperlipidemia    Hypertension    Hypothyroidism    Lactose intolerance    PAC (premature atrial contraction)    Palpitations    Panic attacks    Pt reports restrictive clothing/areas and dry mouth cause her to have attack.   Peripheral vascular disease (HCC)    "small" clot after thermal ablasion   Pneumonia 2016   "walking pneumonia" 3 times in 2016   Shortness of breath    going upstairs (due to weight). not so much since weight loss   Sleep apnea    after weight loss does not have to use cpap   Vitamin B12 deficiency    Vitamin D deficiency    Past Surgical History:  Procedure Laterality Date   APPENDECTOMY  1985   CESAREAN SECTION  1987   twins   COLONOSCOPY     COLONOSCOPY WITH PROPOFOL N/A 02/27/2018   Procedure: COLONOSCOPY WITH PROPOFOL;  Surgeon: Toledo, Boykin Nearing, MD;  Location: [facility];  Service: Gastroenterology;  Laterality: N/A;   IRRIGATION AND DEBRIDEMENT HEMATOMA Right 11/12/2015   Procedure: IRRIGATION AND DEBRIDEMENT HEMATOMA;  Surgeon: Earline Mayotte, MD;  Location: [facility];  Service: General;  Laterality: Right;   KNEE ARTHROSCOPY WITH MEDIAL  MENISECTOMY Left 07/15/2020   Procedure: Left knee arthroscopy with partial medial or lateral menisectomy, possible chondroplasty, possible partial synovectomy;  Surgeon: Lyndle Herrlich, MD;  Location: [facility];  Service: Orthopedics;  Laterality: Left;   LAMINECTOMY  07/10/2013   L4   L5   DR Yetta Barre   LAPAROSCOPIC SALPINGO OOPHERECTOMY Right 04/17/2014   Procedure: LAPAROSCOPIC SALPINGO OOPHORECTOMY RIGHT;  Surgeon: Adolphus Birchwood, MD;  Location: [facility];  Service: Gynecology;  Laterality: Right;   LYSIS OF ADHESION  04/17/2014   Procedure: LYSIS OF ADHESION;  Surgeon: Adolphus Birchwood, MD;  Location: [facility];  Service: Gynecology;;   MAXIMUM ACCESS (MAS)POSTERIOR LUMBAR INTERBODY FUSION (PLIF) 1 LEVEL  07/10/2013   Procedure: FOR MAXIMUM ACCESS (MAS) POSTERIOR LUMBAR INTERBODY FUSION (PLIF) LUMBAR FOUR-FIVE;  Surgeon: Tia Alert, MD;  Location: [facility];  Service: Neurosurgery;;  FOR MAXIMUM ACCESS (MAS) POSTERIOR LUMBAR INTERBODY FUSION (PLIF) LUMBAR FOUR-FIVE   OOPHORECTOMY     LSO   OPEN REDUCTION INTERNAL FIXATION (ORIF) DISTAL RADIAL FRACTURE Left 09/20/2018   Procedure: OPEN REDUCTION INTERNAL FIXATION (ORIF) DISTAL RADIAL FRACTURE, LEFT;  Surgeon: Kennedy Bucker, MD;  Location: [facility];  Service: Orthopedics;  Laterality: Left;   PLANTAR FASCIA SURGERY  1999   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   VAGINAL HYSTERECTOMY  2001   for bleeding and LSO for cyst   Patient Active Problem List   Diagnosis Date Noted   Reactive hypoglycemia 02/16/2022   Prediabetes 02/16/2022   Chronic venous insufficiency 12/23/2021   Lymphedema 12/13/2021   Panic attacks    Strain of knee 12/24/2019   Vitamin B12 deficiency 10/08/2019   Arthritis 07/16/2019   Edema 07/16/2019   History of abdominal pain 07/16/2019   Low back pain 07/16/2019   Vitamin D deficiency 07/16/2019   Hypothyroidism 02/28/2019   History of gastric bypass 02/28/2019   Obstructive sleep apnea syndrome 02/28/2019   Insomnia 02/28/2019    Class 3 severe obesity due to excess calories with serious comorbidity and body mass index (BMI) of 40.0 to 44.9 in adult (HCC) 02/28/2019   Elevated BP without diagnosis of hypertension 02/28/2019   Mixed hyperlipidemia 02/28/2019   Hot flashes 02/28/2019   BMI 40.0-44.9, adult (HCC) 12/16/2015   Hematoma of lower extremity 11/12/2015   Essential hypertension 06/20/2014   Pure hypercholesterolemia 06/20/2014   S/P lumbar spinal fusion 07/10/2013   Arthrodesis status 07/10/2013   Palpitations 01/02/2012   Edema of both legs 01/02/2012    PCP: Joni Reining, PA-C  REFERRING PROVIDER: Foster Simpson, DO  REFERRING DIAG: I89.0  THERAPY DIAG:  Lymphedema, not elsewhere classified  Rationale for Evaluation and Treatment: Rehabilitation  ONSET DATE: Insidious onset with progression over time. Greater than 20 years  SUBJECTIVE:                                                                                                                                                                                          SUBJECTIVE STATEMENT:Mary [name] presents to OT visit to address BLE lipo-lymphedema. Pt is accompanied by her supportive spouse, [name]. Pt denies LE-related pain in her legs. Pt has no new concerns.   PERTINENT HISTORY and contributing factors include, OA, HTN, Panic attacks 2/2 restrictive clothing, OSA (not using CPAP), s/p hysterectomy 1987, L knee sx, oophorectomy, s/p hysterectomy 2001, Gastric Bypass 2007, s/p plantar fascia sx 1999, obesity, insomnia  PAIN:  Are you having pain? No: NPRS scale: 0/10 Pain location: BLE- generalized, knees Pain description: discomfort, heavy, tired, full, sore Aggravating factors: standing, walking, extended dependent sitting Relieving factors: elevation  PRECAUTIONS: Other: LYMPHEDEMA PRECAUTIONS: HYPOTHYROID, DIABETES SKIN PRECAUTIONS  WEIGHT BEARING RESTRICTIONS: No  FALLS:  Has patient fallen since last visit?  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  BLE COMPARATIVE LIMB VOLUMETRICS:  INTAKE: 11/01/22  LANDMARK RIGHT    R LEG (A-D) 5947.2 ml  R THIGH (E-G) 9750.0 ml  R FULL LIMB (A-G) 15697.2 ml  Limb Volume differential (LVD)  %  Volume change since initial %  Volume change overall V  (Blank rows = not tested)  LANDMARK LEFT   L LEG (A-D) 6011.6 ml  L THIGH (E-G) 9602.5 ml  L FULL LIMB (A-G) 15614.1 ml  Limb Volume differential (LVD)  LEG LVD = 1.8%, L>R % THIGH LVD = 1.5%, R>L FULL LIMB LVD = 0.53%, R>L  Volume change since initial %  Volume change overall %  (Blank rows = not tested)   9th VISIT 11/24/22  LANDMARK LEFT   L LEG (A-D) 5918.6 ml  L THIGH (E-G) 9265.5 ml  L FULL LIMB (A-G) 15184.1  ml  Limb Volume differential (LVD)  LEG volume = Decreased 1.5%;  THIGH volume decreased 3.5 %, AND  L FULL LIMB VOLUME dec BY 2.8 % SINCE 11/01/22  Volume change since initial %  Volume change overall %    20 th VISIT 01/11/23  LANDMARK LEFT   L LEG (A-D) 5181.2 ml  L THIGH (E-G)  ml  L FULL LIMB (A-G)  ml  Limb Volume differential (LVD)  L LEG (A-D) volume = Decreased 12.4% since 11/24/22. 10% GOAL MET. Did not measure L thigh or full limb today since we have not been bandaging above the knee.   Volume change since initial L LEG (A-D) volume reduction since commencing CDT on 09/02/22 measures 13.8% 10% GOAL MET.   30 th VISIT 01/11/23  LANDMARK RLE  L LEG (A-D) 5691.5  ml  L THIGH (E-G)  ml  L FULL LIMB (A-G)  ml  Limb Volume since initial 11/01/22 DECREASED BY 4.3%  Volume change overall     TODAY'S TREATMENT:     MLD to RLE/RLQ as established.  Simultaneous skin care to limit infection. 3. RLE multilayer , knee length gradient compression  bandaging 4. Pt/ FAMILY EDU  PATIENT EDUCATION:  Continued Pt/ CG edu for lymphedema self care home program throughout session. Topics include outcome of comparative limb volumetrics- starting vs changing limb volumes over time; technology and gradient techniques used for short stretch, multilayer compression wrapping, simple self-MLD, therapeutic lymphatic pumping exercises, skin/nail care, LE precautions,. compression garment recommendations and specifications, donning and doffing gadgets, wear and care schedule and compression garment donning / doffing w assistive devices. Discussed progress towards all OT goals since commencing CDT. All questions answered to the Pt's satisfaction w comfort and fit. Good return. Person educated: Patient and Spouse Education method: Explanation, Demonstration, and Handouts Education comprehension: verbalized understanding, returned demonstration, verbal cues required, and needs further education   HOME EXERCISE PROGRAM: Lymphatic Pumping Therex-  1 set of 10, 2 x daily, in order, seated  Daily skin inspection and care with low ph lotion matching skin ph to limit infection risk Simple Self-MLD 1 x daily During Intensive Phase CDT: multilayer compression wraps from foot to groin using gradient techniques, one limb at a time  to ensure safety During Self -Management Phase CDT: Fit with custom daytime compression garments: Elvarex, custom, flat knit, ccl 2 ( 23-32 mmHg) knee highs. Consider pairing with Compreshorts Capri length pantyhose  for containment of buttocks, thighs and abdomen.  Fit with BLE knee length Jobst RELAX w/ open toe and zipper to limit ongoing tissue fibrosis formation and facilitate increased lymphatic function during HOS  Custom-made gradient compression garments and HOS devices are medically necessary in this case because they are uniquely sized and  shaped to fit the exact dimensions of the affected extremities, and to provide accurate and consistent gradient compression essential for optimally managing this patient's symptoms of chronic, progressive lymphedema. Multiple custom compression garments are needed for optimal hygiene. Custom compression garments should be replaced q 3-6 months When worn consistently for optimal effectiveness.  ASSESSMENT:  Pt demonstrates slow, but steady progress towards all OT goals. Continued MLD to RLE with simultaneous skin care for full session. Reapplied  compression wraps as established. Cont as per POC.   OBJECTIVE IMPAIRMENTS: Abnormal gait, decreased activity tolerance, decreased balance, decreased knowledge of condition, decreased knowledge of use of DME, decreased mobility, difficulty walking, decreased ROM, increased edema, impaired sensation, obesity, pain, and chronic , progressive swelling and pain of buttocks, hips, abdomen and legs .   ACTIVITY LIMITATIONS: carrying, lifting, bending, sitting, standing, squatting, sleeping, stairs, transfers, bed mobility, bathing, dressing, hygiene/grooming, caring for others, and leisure pursuits, social participation, productive/ work activities  PARTICIPATION LIMITATIONS: meal prep, cleaning, laundry, interpersonal relationship, driving, shopping, community activity, occupation, yard work, and church  PERSONAL FACTORS: Age, Past/current experiences, hx of panic attacks and claustrophobia, Time since onset of injury/illness/exacerbation,, motivation, supportive spouse and family are also affecting patient's functional outcome.   REHAB POTENTIAL: Fair Unfortunately the Lipedema does not respond to CDT and is unchanged by this protocol. Fortunately lymphedema  does respond to differing degrees based on the quality and amount of fatty fibrosis present obstructing lymphatics, and patient's tolerance if skin is hypersensitive.   EVALUATION COMPLEXITY:  Moderate   GOALS: Goals reviewed with patient? Yes  SHORT TERM GOALS: Target date: 4th OT Rx visit   Pt will demonstrate understanding of lymphedema precautions and prevention strategies with modified independence using a printed reference to identify at least 5 precautions and discussing how s/he may implement them into daily life to reduce risk of progression with modified assistance ( printed reference). Baseline: Max A Goal status: 12/12/22 PROGRESSING 01/11/23 GOAL MET  2.  Pt will be able to apply multilayer, thigh length, compression wraps using gradient techniques with Max caregiver assistance to decrease limb volume, to limit infection risk, and to limit lymphedema progression.  Baseline: Dependent Goal status: 11/10/22 Goal Met  LONG TERM GOALS: Target date: 04/24/23  Given this patient's Intake score of 36.76 % on the Lymphedema Life Impact Scale (LLIS), patient will experience a reduction of at least 5 points in her perceived level of functional impairment resulting from lymphedema to improve functional performance and quality of life (QOL). Baseline: 36.76% Goal status: 02/20/23 PROGRESSING  2.  Given this patient's Intake score of 56/100% on the functional outcomes FOTO tool, patient will experience an increase in function of 3 points to improve basic and instrumental ADLs performance, including lymphedema self-care.  Baseline: 56% Goal status: 02/20/23 PROGRESSING  3.  During Intensive phase CDT Pt will achieve at least 85% compliance with all lymphedema self-care home program components, including  daily skin care, multilayer , gradient compression wraps with daily changes, daily simple self MLD and daily lymphatic pumping therex to achieve optimal clinical outcome and to habituate self care regime for optimal LE self-management over time. Baseline: Dependent Goal status: 01/09/23 GOAL MET  4.  Pt will achieve at least a 10% volume reductions bilaterally below the knees to return  limb to more typical size and shape, to limit infection risk and LE progression, to decrease pain, to improve function, and to improve body image and QOL. Baseline: Dependent Goal status: 12/12/22 PROGRESSING. 01/11/23 GOAL MET for L LEG  5.  Pt will be able to don and doff appropriate compression garments and devices using assistive devices and extra time within 1 week of issue date for optimal lymphedema self-care. Baseline: Dependent Goal status: 01/25/23 GOAL MET   PLAN:  PT FREQUENCY: 2x/week  PT DURATION: 12  weeks other: and PRN  PLANNED INTERVENTIONS: Therapeutic exercises, Therapeutic activity, Patient/Family education, Self Care, DME instructions, Manual lymph drainage, Compression bandaging, Manual therapy, and skin care during MLD, fit with appropriate custom compression garments and devices, fit with appropriate compression device  which  follows lymphatic pathways and anatomic distribution  PLAN FOR NEXT SESSION:  MLD Compression Skin care Pt/ family edu for LE self care   Loel Dubonnet, MS, OTR/L, CLT-LANA 02/28/23 4:05 PM

## 2023-03-02 ENCOUNTER — Encounter: Payer: Self-pay | Admitting: Occupational Therapy

## 2023-03-02 ENCOUNTER — Ambulatory Visit: Payer: 59 | Admitting: Occupational Therapy

## 2023-03-02 DIAGNOSIS — I890 Lymphedema, not elsewhere classified: Secondary | ICD-10-CM

## 2023-03-03 ENCOUNTER — Encounter: Payer: Self-pay | Admitting: Occupational Therapy

## 2023-03-03 NOTE — Therapy (Signed)
OUTPATIENT OCCUPATIONAL THERAPY TREATMENT NOTE LOWER EXTREMITY LYMPHEDEMA  Patient Name: Mary [name] MRN: [id number] DOB:September 14, 1960, 62 y.o., female Today's Date: 03/03/2023   END OF SESSION:  OT End of Session - 03/02/23 1524     Visit Number 33    Number of Visits 36    Date for OT Re-Evaluation 04/24/23    OT Start Time 0305    OT Stop Time 0405    OT Time Calculation (min) 60 min    Activity Tolerance Patient tolerated treatment well;No increased pain    Behavior During Therapy WFL for tasks assessed/performed                 Past Medical History:  Diagnosis Date   Anemia    low iron at times   Arthritis    Colon polyps    Complication of anesthesia 1991   Pt reports she awoke during plantar fascia surgery and felt "everything"   Heart murmur    " small per patient"    Hyperlipidemia    Hypertension    Hypothyroidism    Lactose intolerance    PAC (premature atrial contraction)    Palpitations    Panic attacks    Pt reports restrictive clothing/areas and dry mouth cause her to have attack.   Peripheral vascular disease (HCC)    "small" clot after thermal ablasion   Pneumonia 2016   "walking pneumonia" 3 times in 2016   Shortness of breath    going upstairs (due to weight). not so much since weight loss   Sleep apnea    after weight loss does not have to use cpap   Vitamin B12 deficiency    Vitamin D deficiency    Past Surgical History:  Procedure Laterality Date   APPENDECTOMY  1985   CESAREAN SECTION  1987   twins   COLONOSCOPY     COLONOSCOPY WITH PROPOFOL N/A 02/27/2018   Procedure: COLONOSCOPY WITH PROPOFOL;  Surgeon: Toledo, Boykin Nearing, MD;  Location: [facility];  Service: Gastroenterology;  Laterality: N/A;   IRRIGATION AND DEBRIDEMENT HEMATOMA Right 11/12/2015   Procedure: IRRIGATION AND DEBRIDEMENT HEMATOMA;  Surgeon: Earline Mayotte, MD;  Location: [facility];  Service: General;  Laterality: Right;   KNEE ARTHROSCOPY WITH MEDIAL  MENISECTOMY Left 07/15/2020   Procedure: Left knee arthroscopy with partial medial or lateral menisectomy, possible chondroplasty, possible partial synovectomy;  Surgeon: Lyndle Herrlich, MD;  Location: [facility];  Service: Orthopedics;  Laterality: Left;   LAMINECTOMY  07/10/2013   L4   L5   DR Yetta Barre   LAPAROSCOPIC SALPINGO OOPHERECTOMY Right 04/17/2014   Procedure: LAPAROSCOPIC SALPINGO OOPHORECTOMY RIGHT;  Surgeon: Adolphus Birchwood, MD;  Location: [facility];  Service: Gynecology;  Laterality: Right;   LYSIS OF ADHESION  04/17/2014   Procedure: LYSIS OF ADHESION;  Surgeon: Adolphus Birchwood, MD;  Location: [facility];  Service: Gynecology;;   MAXIMUM ACCESS (MAS)POSTERIOR LUMBAR INTERBODY FUSION (PLIF) 1 LEVEL  07/10/2013   Procedure: FOR MAXIMUM ACCESS (MAS) POSTERIOR LUMBAR INTERBODY FUSION (PLIF) LUMBAR FOUR-FIVE;  Surgeon: Tia Alert, MD;  Location: [facility];  Service: Neurosurgery;;  FOR MAXIMUM ACCESS (MAS) POSTERIOR LUMBAR INTERBODY FUSION (PLIF) LUMBAR FOUR-FIVE   OOPHORECTOMY     LSO   OPEN REDUCTION INTERNAL FIXATION (ORIF) DISTAL RADIAL FRACTURE Left 09/20/2018   Procedure: OPEN REDUCTION INTERNAL FIXATION (ORIF) DISTAL RADIAL FRACTURE, LEFT;  Surgeon: Kennedy Bucker, MD;  Location: [facility];  Service: Orthopedics;  Laterality: Left;   PLANTAR FASCIA SURGERY  1999   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   VAGINAL HYSTERECTOMY  2001   for bleeding and LSO for cyst   Patient Active Problem List   Diagnosis Date Noted   Reactive hypoglycemia 02/16/2022   Prediabetes 02/16/2022   Chronic venous insufficiency 12/23/2021   Lymphedema 12/13/2021   Panic attacks    Strain of knee 12/24/2019   Vitamin B12 deficiency 10/08/2019   Arthritis 07/16/2019   Edema 07/16/2019   History of abdominal pain 07/16/2019   Low back pain 07/16/2019   Vitamin D deficiency 07/16/2019   Hypothyroidism 02/28/2019   History of gastric bypass 02/28/2019   Obstructive sleep apnea syndrome 02/28/2019   Insomnia 02/28/2019    Class 3 severe obesity due to excess calories with serious comorbidity and body mass index (BMI) of 40.0 to 44.9 in adult (HCC) 02/28/2019   Elevated BP without diagnosis of hypertension 02/28/2019   Mixed hyperlipidemia 02/28/2019   Hot flashes 02/28/2019   BMI 40.0-44.9, adult (HCC) 12/16/2015   Hematoma of lower extremity 11/12/2015   Essential hypertension 06/20/2014   Pure hypercholesterolemia 06/20/2014   S/P lumbar spinal fusion 07/10/2013   Arthrodesis status 07/10/2013   Palpitations 01/02/2012   Edema of both legs 01/02/2012    PCP: Joni Reining, PA-C  REFERRING PROVIDER: Foster Simpson, DO  REFERRING DIAG: I89.0  THERAPY DIAG:  Lymphedema, not elsewhere classified  Rationale for Evaluation and Treatment: Rehabilitation  ONSET DATE: Insidious onset with progression over time. Greater than 20 years  SUBJECTIVE:                                                                                                                                                                                          SUBJECTIVE STATEMENT:Mary [name] Range presents to OT visit to address BLE lipo-lymphedema. Pt is accompanied by her supportive spouse, Mary [name]. Pt denies LE-related pain in her legs. Pt has no new concerns.   PERTINENT HISTORY and contributing factors include, OA, HTN, Panic attacks 2/2 restrictive clothing, OSA (not using CPAP), s/p hysterectomy 1987, L knee sx, oophorectomy, s/p hysterectomy 2001, Gastric Bypass 2007, s/p plantar fascia sx 1999, obesity, insomnia  PAIN:  Are you having pain? No: NPRS scale: 0/10 Pain location: BLE- generalized, knees Pain description: discomfort, heavy, tired, full, sore Aggravating factors: standing, walking, extended dependent sitting Relieving factors: elevation  PRECAUTIONS: Other: LYMPHEDEMA PRECAUTIONS: HYPOTHYROID, DIABETES SKIN PRECAUTIONS  WEIGHT BEARING RESTRICTIONS: No  FALLS:  Has patient fallen since last visit?  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  BLE COMPARATIVE LIMB VOLUMETRICS:  INTAKE: 11/01/22  LANDMARK RIGHT    R LEG (A-D) 5947.2 ml  R THIGH (E-G) 9750.0 ml  R FULL LIMB (A-G) 15697.2 ml  Limb Volume differential (LVD)  %  Volume change since initial %  Volume change overall V  (Blank rows = not tested)  LANDMARK LEFT   L LEG (A-D) 6011.6 ml  L THIGH (E-G) 9602.5 ml  L FULL LIMB (A-G) 15614.1 ml  Limb Volume differential (LVD)  LEG LVD = 1.8%, L>R % THIGH LVD = 1.5%, R>L FULL LIMB LVD = 0.53%, R>L  Volume change since initial %  Volume change overall %  (Blank rows = not tested)   9th VISIT 11/24/22  LANDMARK LEFT   L LEG (A-D) 5918.6 ml  L THIGH (E-G) 9265.5 ml  L FULL LIMB (A-G) 15184.1  ml  Limb Volume differential (LVD)  LEG volume = Decreased 1.5%;  THIGH volume decreased 3.5 %, AND  L FULL LIMB VOLUME dec BY 2.8 % SINCE 11/01/22  Volume change since initial %  Volume change overall %    20 th VISIT 01/11/23  LANDMARK LEFT   L LEG (A-D) 5181.2 ml  L THIGH (E-G)  ml  L FULL LIMB (A-G)  ml  Limb Volume differential (LVD)  L LEG (A-D) volume = Decreased 12.4% since 11/24/22. 10% GOAL MET. Did not measure L thigh or full limb today since we have not been bandaging above the knee.   Volume change since initial L LEG (A-D) volume reduction since commencing CDT on 09/02/22 measures 13.8% 10% GOAL MET.   30 th VISIT 01/11/23  LANDMARK RLE  L LEG (A-D) 5691.5  ml  L THIGH (E-G)  ml  L FULL LIMB (A-G)  ml  Limb Volume since initial 11/01/22 DECREASED BY 4.3%  Volume change overall     TODAY'S TREATMENT:     MLD to RLE/RLQ as established.  Simultaneous skin care to limit infection. 3. RLE multilayer , knee length gradient compression  bandaging 4. Pt/ FAMILY EDU  PATIENT EDUCATION:  Continued Pt/ CG edu for lymphedema self care home program throughout session. Topics include outcome of comparative limb volumetrics- starting vs changing limb volumes over time; technology and gradient techniques used for short stretch, multilayer compression wrapping, simple self-MLD, therapeutic lymphatic pumping exercises, skin/nail care, LE precautions,. compression garment recommendations and specifications, donning and doffing gadgets, wear and care schedule and compression garment donning / doffing w assistive devices. Discussed progress towards all OT goals since commencing CDT. All questions answered to the Pt's satisfaction w comfort and fit. Good return. Person educated: Patient and Spouse Education method: Explanation, Demonstration, and Handouts Education comprehension: verbalized understanding, returned demonstration, verbal cues required, and needs further education   HOME EXERCISE PROGRAM: Lymphatic Pumping Therex-  1 set of 10, 2 x daily, in order, seated  Daily skin inspection and care with low ph lotion matching skin ph to limit infection risk Simple Self-MLD 1 x daily During Intensive Phase CDT: multilayer compression wraps from foot to groin using gradient techniques, one limb at a time  to ensure safety During Self -Management Phase CDT: Fit with custom daytime compression garments: Elvarex, custom, flat knit, ccl 2 ( 23-32 mmHg) knee highs. Consider pairing with Compreshorts Capri length pantyhose  for containment of buttocks, thighs and abdomen.  Fit with BLE knee length Jobst RELAX w/ open toe and zipper to limit ongoing tissue fibrosis formation and facilitate increased lymphatic function during HOS  Custom-made gradient compression garments and HOS devices are medically necessary in this case because they are uniquely sized and  shaped to fit the exact dimensions of the affected extremities, and to provide accurate and consistent gradient compression essential for optimally managing this patient's symptoms of chronic, progressive lymphedema. Multiple custom compression garments are needed for optimal hygiene. Custom compression garments should be replaced q 3-6 months When worn consistently for optimal effectiveness.  ASSESSMENT: Pt tolerated all aspects of manual therapy today without pain. As typical we performed simultaneous skin care with low ph castor oil throughout MLD to decrease infection risk. Reapplied RLE knee length wraps as established. Fit R custom stocking ASAP. Cont as per POC.  OBJECTIVE IMPAIRMENTS: Abnormal gait, decreased activity tolerance, decreased balance, decreased knowledge of condition, decreased knowledge of use of DME, decreased mobility, difficulty walking, decreased ROM, increased edema, impaired sensation, obesity, pain, and chronic , progressive swelling and pain of buttocks, hips, abdomen and legs .   ACTIVITY LIMITATIONS: carrying, lifting, bending, sitting, standing, squatting, sleeping, stairs, transfers, bed mobility, bathing, dressing, hygiene/grooming, caring for others, and leisure pursuits, social participation, productive/ work activities  PARTICIPATION LIMITATIONS: meal prep, cleaning, laundry, interpersonal relationship, driving, shopping, community activity, occupation, yard work, and church  PERSONAL FACTORS: Age, Past/current experiences, hx of panic attacks and claustrophobia, Time since onset of injury/illness/exacerbation,, motivation, supportive spouse and family are also affecting patient's functional outcome.   REHAB POTENTIAL: Fair Unfortunately the Lipedema does not respond to CDT and is unchanged by this protocol. Fortunately lymphedema  does respond to differing degrees based on the quality and amount of fatty fibrosis present obstructing lymphatics, and patient's  tolerance if skin is hypersensitive.   EVALUATION COMPLEXITY: Moderate   GOALS: Goals reviewed with patient? Yes  SHORT TERM GOALS: Target date: 4th OT Rx visit   Pt will demonstrate understanding of lymphedema precautions and prevention strategies with modified independence using a printed reference to identify at least 5 precautions and discussing how s/he may implement them into daily life to reduce risk of progression with modified assistance ( printed reference). Baseline: Max A Goal status: 12/12/22 PROGRESSING 01/11/23 GOAL MET  2.  Pt will be able to apply multilayer, thigh length, compression wraps using gradient techniques with Max caregiver assistance to decrease limb volume, to limit infection risk, and to limit lymphedema progression.  Baseline: Dependent Goal status: 11/10/22 Goal Met  LONG TERM GOALS: Target date: 04/24/23  Given this patient's Intake score of 36.76 % on the Lymphedema Life Impact Scale (LLIS), patient will experience a reduction of at least 5 points in her perceived level of functional impairment resulting from lymphedema to improve functional performance and quality of life (QOL). Baseline: 36.76% Goal status: 02/20/23 PROGRESSING  2.  Given this patient's Intake score of 56/100% on the functional outcomes FOTO tool, patient will experience an increase in function of 3 points to improve basic and instrumental ADLs performance, including lymphedema self-care.  Baseline: 56% Goal status: 02/20/23 PROGRESSING  3.  During Intensive phase CDT Pt will achieve at least 85% compliance with all lymphedema self-care home program components, including  daily skin care, multilayer , gradient compression wraps with daily changes, daily simple self MLD and daily lymphatic pumping therex to achieve optimal clinical outcome and to habituate self care regime for optimal LE  self-management over time. Baseline: Dependent Goal status: 01/09/23 GOAL MET  4.  Pt will achieve at  least a 10% volume reductions bilaterally below the knees to return limb to more typical size and shape, to limit infection risk and LE progression, to decrease pain, to improve function, and to improve body image and QOL. Baseline: Dependent Goal status: 12/12/22 PROGRESSING. 01/11/23 GOAL MET for L LEG  5.  Pt will be able to don and doff appropriate compression garments and devices using assistive devices and extra time within 1 week of issue date for optimal lymphedema self-care. Baseline: Dependent Goal status: 01/25/23 GOAL MET   PLAN:  PT FREQUENCY: 2x/week  PT DURATION: 12  weeks other: and PRN  PLANNED INTERVENTIONS: Therapeutic exercises, Therapeutic activity, Patient/Family education, Self Care, DME instructions, Manual lymph drainage, Compression bandaging, Manual therapy, and skin care during MLD, fit with appropriate custom compression garments and devices, fit with appropriate compression device  which  follows lymphatic pathways and anatomic distribution  PLAN FOR NEXT SESSION:  MLD Compression Skin care Pt/ family edu for LE self care   Loel Dubonnet, MS, OTR/L, CLT-LANA 03/03/23 12:06 PM

## 2023-03-07 ENCOUNTER — Encounter: Payer: Self-pay | Admitting: Occupational Therapy

## 2023-03-07 ENCOUNTER — Ambulatory Visit: Payer: 59 | Admitting: Occupational Therapy

## 2023-03-07 DIAGNOSIS — I890 Lymphedema, not elsewhere classified: Secondary | ICD-10-CM | POA: Diagnosis not present

## 2023-03-07 NOTE — Therapy (Signed)
OUTPATIENT OCCUPATIONAL THERAPY TREATMENT NOTE LOWER EXTREMITY LYMPHEDEMA  Patient Name: Mary [name] MRN: [id number] DOB:07-08-1961, 62 y.o., female Today's Date: 03/07/2023   END OF SESSION:  OT End of Session - 03/07/23 1450     Visit Number 34    Number of Visits 36    Date for OT Re-Evaluation 04/24/23    OT Start Time 0251    OT Stop Time 0351    OT Time Calculation (min) 60 min    Activity Tolerance Patient tolerated treatment well;No increased pain    Behavior During Therapy WFL for tasks assessed/performed                 Past Medical History:  Diagnosis Date   Anemia    low iron at times   Arthritis    Colon polyps    Complication of anesthesia 1991   Pt reports she awoke during plantar fascia surgery and felt "everything"   Heart murmur    " small per patient"    Hyperlipidemia    Hypertension    Hypothyroidism    Lactose intolerance    PAC (premature atrial contraction)    Palpitations    Panic attacks    Pt reports restrictive clothing/areas and dry mouth cause her to have attack.   Peripheral vascular disease (HCC)    "small" clot after thermal ablasion   Pneumonia 2016   "walking pneumonia" 3 times in 2016   Shortness of breath    going upstairs (due to weight). not so much since weight loss   Sleep apnea    after weight loss does not have to use cpap   Vitamin B12 deficiency    Vitamin D deficiency    Past Surgical History:  Procedure Laterality Date   APPENDECTOMY  1985   CESAREAN SECTION  1987   twins   COLONOSCOPY     COLONOSCOPY WITH PROPOFOL N/A 02/27/2018   Procedure: COLONOSCOPY WITH PROPOFOL;  Surgeon: Toledo, Boykin Nearing, MD;  Location: [facility];  Service: Gastroenterology;  Laterality: N/A;   IRRIGATION AND DEBRIDEMENT HEMATOMA Right 11/12/2015   Procedure: IRRIGATION AND DEBRIDEMENT HEMATOMA;  Surgeon: Earline Mayotte, MD;  Location: [facility];  Service: General;  Laterality: Right;   KNEE ARTHROSCOPY WITH MEDIAL  MENISECTOMY Left 07/15/2020   Procedure: Left knee arthroscopy with partial medial or lateral menisectomy, possible chondroplasty, possible partial synovectomy;  Surgeon: Lyndle Herrlich, MD;  Location: [facility];  Service: Orthopedics;  Laterality: Left;   LAMINECTOMY  07/10/2013   L4   L5   DR Yetta Barre   LAPAROSCOPIC SALPINGO OOPHERECTOMY Right 04/17/2014   Procedure: LAPAROSCOPIC SALPINGO OOPHORECTOMY RIGHT;  Surgeon: Adolphus Birchwood, MD;  Location: [facility];  Service: Gynecology;  Laterality: Right;   LYSIS OF ADHESION  04/17/2014   Procedure: LYSIS OF ADHESION;  Surgeon: Adolphus Birchwood, MD;  Location: [facility];  Service: Gynecology;;   MAXIMUM ACCESS (MAS)POSTERIOR LUMBAR INTERBODY FUSION (PLIF) 1 LEVEL  07/10/2013   Procedure: FOR MAXIMUM ACCESS (MAS) POSTERIOR LUMBAR INTERBODY FUSION (PLIF) LUMBAR FOUR-FIVE;  Surgeon: Tia Alert, MD;  Location: [facility];  Service: Neurosurgery;;  FOR MAXIMUM ACCESS (MAS) POSTERIOR LUMBAR INTERBODY FUSION (PLIF) LUMBAR FOUR-FIVE   OOPHORECTOMY     LSO   OPEN REDUCTION INTERNAL FIXATION (ORIF) DISTAL RADIAL FRACTURE Left 09/20/2018   Procedure: OPEN REDUCTION INTERNAL FIXATION (ORIF) DISTAL RADIAL FRACTURE, LEFT;  Surgeon: Kennedy Bucker, MD;  Location: [facility];  Service: Orthopedics;  Laterality: Left;   PLANTAR FASCIA SURGERY  1999   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   VAGINAL HYSTERECTOMY  2001   for bleeding and LSO for cyst   Patient Active Problem List   Diagnosis Date Noted   Reactive hypoglycemia 02/16/2022   Prediabetes 02/16/2022   Chronic venous insufficiency 12/23/2021   Lymphedema 12/13/2021   Panic attacks    Strain of knee 12/24/2019   Vitamin B12 deficiency 10/08/2019   Arthritis 07/16/2019   Edema 07/16/2019   History of abdominal pain 07/16/2019   Low back pain 07/16/2019   Vitamin D deficiency 07/16/2019   Hypothyroidism 02/28/2019   History of gastric bypass 02/28/2019   Obstructive sleep apnea syndrome 02/28/2019   Insomnia 02/28/2019    Class 3 severe obesity due to excess calories with serious comorbidity and body mass index (BMI) of 40.0 to 44.9 in adult (HCC) 02/28/2019   Elevated BP without diagnosis of hypertension 02/28/2019   Mixed hyperlipidemia 02/28/2019   Hot flashes 02/28/2019   BMI 40.0-44.9, adult (HCC) 12/16/2015   Hematoma of lower extremity 11/12/2015   Essential hypertension 06/20/2014   Pure hypercholesterolemia 06/20/2014   S/P lumbar spinal fusion 07/10/2013   Arthrodesis status 07/10/2013   Palpitations 01/02/2012   Edema of both legs 01/02/2012    PCP: Joni Reining, PA-C  REFERRING PROVIDER: Foster Simpson, DO  REFERRING DIAG: I89.0  THERAPY DIAG:  Lymphedema, not elsewhere classified  Rationale for Evaluation and Treatment: Rehabilitation  ONSET DATE: Insidious onset with progression over time. Greater than 20 years  SUBJECTIVE:                                                                                                                                                                                          SUBJECTIVE STATEMENT:Mary [name] presents to OT visit to address BLE lipo-lymphedema. Pt is accompanied by her supportive spouse, Mary [name]. Pt denies LE-related pain in her legs. Pt reports she has a stress headache.  PERTINENT HISTORY and contributing factors include, OA, HTN, Panic attacks 2/2 restrictive clothing, OSA (not using CPAP), s/p hysterectomy 1987, L knee sx, oophorectomy, s/p hysterectomy 2001, Gastric Bypass 2007, s/p plantar fascia sx 1999, obesity, insomnia  PAIN:  Are you having LE-related pain? No: NPRS scale: 0/10. Pt reports a headache at 8/10. She suspects high BP and stress. Pain location: BLE- generalized, knees Pain description: discomfort, heavy, tired, full, sore Aggravating factors: standing, walking, extended dependent sitting Relieving factors: elevation  PRECAUTIONS: Other: LYMPHEDEMA PRECAUTIONS: HYPOTHYROID, DIABETES SKIN  PRECAUTIONS  WEIGHT BEARING RESTRICTIONS: No  FALLS:  Has patient fallen since last visit? 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  BLE COMPARATIVE LIMB VOLUMETRICS:  INTAKE: 11/01/22  LANDMARK RIGHT    R LEG (A-D) 5947.2 ml  R THIGH (E-G) 9750.0 ml  R FULL LIMB (A-G) 15697.2 ml  Limb Volume differential (LVD)  %  Volume change since initial %  Volume change overall V  (Blank rows = not tested)  LANDMARK LEFT   L LEG (A-D) 6011.6 ml  L THIGH (E-G) 9602.5 ml  L FULL LIMB (A-G) 15614.1 ml  Limb Volume differential (LVD)  LEG LVD = 1.8%, L>R % THIGH LVD = 1.5%, R>L FULL LIMB LVD = 0.53%, R>L  Volume change since initial %  Volume change overall %  (Blank rows = not tested)   9th VISIT 11/24/22  LANDMARK LEFT   L LEG (A-D) 5918.6 ml  L THIGH (E-G) 9265.5 ml  L FULL LIMB (A-G) 15184.1  ml  Limb Volume differential (LVD)  LEG volume = Decreased 1.5%;  THIGH volume decreased 3.5 %, AND  L FULL LIMB VOLUME dec BY 2.8 % SINCE 11/01/22  Volume change since initial %  Volume change overall %    20 th VISIT 01/11/23  LANDMARK LEFT   L LEG (A-D) 5181.2 ml  L THIGH (E-G)  ml  L FULL LIMB (A-G)  ml  Limb Volume differential (LVD)  L LEG (A-D) volume = Decreased 12.4% since 11/24/22. 10% GOAL MET. Did not measure L thigh or full limb today since we have not been bandaging above the knee.   Volume change since initial L LEG (A-D) volume reduction since commencing CDT on 09/02/22 measures 13.8% 10% GOAL MET.   30 th VISIT 01/11/23  LANDMARK RLE  L LEG (A-D) 5691.5  ml  L THIGH (E-G)  ml  L FULL LIMB (A-G)  ml  Limb Volume since initial 11/01/22 DECREASED BY  4.3%  Volume change overall     TODAY'S TREATMENT:     MLD to RLE/RLQ as established. Simultaneous skin care to limit infection. 3. RLE multilayer , knee length gradient compression  bandaging 4. Pt/ FAMILY EDU  PATIENT EDUCATION:  Continued Pt/ CG edu for lymphedema self care home program throughout session. Topics include outcome of comparative limb volumetrics- starting vs changing limb volumes over time; technology and gradient techniques used for short stretch, multilayer compression wrapping, simple self-MLD, therapeutic lymphatic pumping exercises, skin/nail care, LE precautions,. compression garment recommendations and specifications, donning and doffing gadgets, wear and care schedule and compression garment donning / doffing w assistive devices. Discussed progress towards all OT goals since commencing CDT. All questions answered to the Pt's satisfaction w comfort and fit. Good return. Person educated: Patient and Spouse Education method: Explanation, Demonstration, and Handouts Education comprehension: verbalized understanding, returned demonstration, verbal cues required, and needs further education   HOME EXERCISE PROGRAM: Lymphatic Pumping Therex-  1 set of 10, 2 x daily, in order, seated  Daily skin inspection and care with low ph lotion matching skin ph to limit infection risk Simple Self-MLD 1 x daily During Intensive Phase CDT: multilayer compression wraps from foot to groin using gradient techniques, one limb at a time  to ensure safety During Self -Management Phase CDT: Fit with custom daytime compression garments: Elvarex, custom, flat knit,  ccl 2 ( 23-32 mmHg) knee highs. Consider pairing with Compreshorts Capri length pantyhose for containment of buttocks, thighs and abdomen.  Fit with BLE knee length Jobst RELAX w/ open toe and zipper to limit ongoing tissue fibrosis formation and facilitate increased lymphatic function during HOS  Custom-made gradient compression  garments and HOS devices are medically necessary in this case because they are uniquely sized and shaped to fit the exact dimensions of the affected extremities, and to provide accurate and consistent gradient compression essential for optimally managing this patient's symptoms of chronic, progressive lymphedema. Multiple custom compression garments are needed for optimal hygiene. Custom compression garments should be replaced q 3-6 months When worn consistently for optimal effectiveness.  ASSESSMENT: Pt tolerated all aspects of manual therapy today without pain. As typical we performed simultaneous skin care with low ph castor oil throughout MLD to decrease infection risk. Reapplied RLE knee length wraps as established. Fit R custom stocking ASAP. Awaiting delivery from DME vendor. Cont as per POC.  OBJECTIVE IMPAIRMENTS: Abnormal gait, decreased activity tolerance, decreased balance, decreased knowledge of condition, decreased knowledge of use of DME, decreased mobility, difficulty walking, decreased ROM, increased edema, impaired sensation, obesity, pain, and chronic , progressive swelling and pain of buttocks, hips, abdomen and legs .   ACTIVITY LIMITATIONS: carrying, lifting, bending, sitting, standing, squatting, sleeping, stairs, transfers, bed mobility, bathing, dressing, hygiene/grooming, caring for others, and leisure pursuits, social participation, productive/ work activities  PARTICIPATION LIMITATIONS: meal prep, cleaning, laundry, interpersonal relationship, driving, shopping, community activity, occupation, yard work, and church  PERSONAL FACTORS: Age, Past/current experiences, hx of panic attacks and claustrophobia, Time since onset of injury/illness/exacerbation,, motivation, supportive spouse and family are also affecting patient's functional outcome.   REHAB POTENTIAL: Fair Unfortunately the Lipedema does not respond to CDT and is unchanged by this protocol. Fortunately lymphedema  does  respond to differing degrees based on the quality and amount of fatty fibrosis present obstructing lymphatics, and patient's tolerance if skin is hypersensitive.   EVALUATION COMPLEXITY: Moderate   GOALS: Goals reviewed with patient? Yes  SHORT TERM GOALS: Target date: 4th OT Rx visit   Pt will demonstrate understanding of lymphedema precautions and prevention strategies with modified independence using a printed reference to identify at least 5 precautions and discussing how s/he may implement them into daily life to reduce risk of progression with modified assistance ( printed reference). Baseline: Max A Goal status: 12/12/22 PROGRESSING 01/11/23 GOAL MET  2.  Pt will be able to apply multilayer, thigh length, compression wraps using gradient techniques with Max caregiver assistance to decrease limb volume, to limit infection risk, and to limit lymphedema progression.  Baseline: Dependent Goal status: 11/10/22 Goal Met  LONG TERM GOALS: Target date: 04/24/23  Given this patient's Intake score of 36.76 % on the Lymphedema Life Impact Scale (LLIS), patient will experience a reduction of at least 5 points in her perceived level of functional impairment resulting from lymphedema to improve functional performance and quality of life (QOL). Baseline: 36.76% Goal status: 02/20/23 PROGRESSING  2.  Given this patient's Intake score of 56/100% on the functional outcomes FOTO tool, patient will experience an increase in function of 3 points to improve basic and instrumental ADLs performance, including lymphedema self-care.  Baseline: 56% Goal status: 02/20/23 PROGRESSING  3.  During Intensive phase CDT Pt will achieve at least 85% compliance with all lymphedema self-care home program components, including  daily skin care, multilayer , gradient compression wraps with daily changes, daily simple self MLD  and daily lymphatic pumping therex to achieve optimal clinical outcome and to habituate self care  regime for optimal LE self-management over time. Baseline: Dependent Goal status: 01/09/23 GOAL MET  4.  Pt will achieve at least a 10% volume reductions bilaterally below the knees to return limb to more typical size and shape, to limit infection risk and LE progression, to decrease pain, to improve function, and to improve body image and QOL. Baseline: Dependent Goal status: 12/12/22 PROGRESSING. 01/11/23 GOAL MET for L LEG  5.  Pt will be able to don and doff appropriate compression garments and devices using assistive devices and extra time within 1 week of issue date for optimal lymphedema self-care. Baseline: Dependent Goal status: 01/25/23 GOAL MET   PLAN:  PT FREQUENCY: 2x/week  PT DURATION: 12  weeks other: and PRN  PLANNED INTERVENTIONS: Therapeutic exercises, Therapeutic activity, Patient/Family education, Self Care, DME instructions, Manual lymph drainage, Compression bandaging, Manual therapy, and skin care during MLD, fit with appropriate custom compression garments and devices, fit with appropriate compression device  which  follows lymphatic pathways and anatomic distribution  PLAN FOR NEXT SESSION:  MLD Compression Skin care Pt/ family edu for LE self care   Loel Dubonnet, MS, OTR/L, CLT-LANA 03/07/23 3:48 PM

## 2023-03-09 ENCOUNTER — Ambulatory Visit: Payer: 59 | Admitting: Occupational Therapy

## 2023-03-09 DIAGNOSIS — I890 Lymphedema, not elsewhere classified: Secondary | ICD-10-CM

## 2023-03-09 NOTE — Therapy (Signed)
OUTPATIENT OCCUPATIONAL THERAPY TREATMENT NOTE LOWER EXTREMITY LYMPHEDEMA  Patient Name: Mary [name] MRN: [id number] DOB:1960/09/13, 62 y.o., female Today's Date: 03/09/2023   END OF SESSION:  OT End of Session - 03/09/23 1603     Visit Number 35    Number of Visits 36    Date for OT Re-Evaluation 04/24/23    OT Start Time 0300    OT Stop Time 0400    OT Time Calculation (min) 60 min    Activity Tolerance Patient tolerated treatment well;No increased pain    Behavior During Therapy WFL for tasks assessed/performed                 Past Medical History:  Diagnosis Date   Anemia    low iron at times   Arthritis    Colon polyps    Complication of anesthesia 1991   Pt reports she awoke during plantar fascia surgery and felt "everything"   Heart murmur    " small per patient"    Hyperlipidemia    Hypertension    Hypothyroidism    Lactose intolerance    PAC (premature atrial contraction)    Palpitations    Panic attacks    Pt reports restrictive clothing/areas and dry mouth cause her to have attack.   Peripheral vascular disease (HCC)    "small" clot after thermal ablasion   Pneumonia 2016   "walking pneumonia" 3 times in 2016   Shortness of breath    going upstairs (due to weight). not so much since weight loss   Sleep apnea    after weight loss does not have to use cpap   Vitamin B12 deficiency    Vitamin D deficiency    Past Surgical History:  Procedure Laterality Date   APPENDECTOMY  1985   CESAREAN SECTION  1987   twins   COLONOSCOPY     COLONOSCOPY WITH PROPOFOL N/A 02/27/2018   Procedure: COLONOSCOPY WITH PROPOFOL;  Surgeon: Toledo, Boykin Nearing, MD;  Location: [facility];  Service: Gastroenterology;  Laterality: N/A;   IRRIGATION AND DEBRIDEMENT HEMATOMA Right 11/12/2015   Procedure: IRRIGATION AND DEBRIDEMENT HEMATOMA;  Surgeon: Earline Mayotte, MD;  Location: [facility];  Service: General;  Laterality: Right;   KNEE ARTHROSCOPY WITH MEDIAL  MENISECTOMY Left 07/15/2020   Procedure: Left knee arthroscopy with partial medial or lateral menisectomy, possible chondroplasty, possible partial synovectomy;  Surgeon: Lyndle Herrlich, MD;  Location: [facility];  Service: Orthopedics;  Laterality: Left;   LAMINECTOMY  07/10/2013   L4   L5   DR Yetta Barre   LAPAROSCOPIC SALPINGO OOPHERECTOMY Right 04/17/2014   Procedure: LAPAROSCOPIC SALPINGO OOPHORECTOMY RIGHT;  Surgeon: Adolphus Birchwood, MD;  Location: [facility];  Service: Gynecology;  Laterality: Right;   LYSIS OF ADHESION  04/17/2014   Procedure: LYSIS OF ADHESION;  Surgeon: Adolphus Birchwood, MD;  Location: [facility];  Service: Gynecology;;   MAXIMUM ACCESS (MAS)POSTERIOR LUMBAR INTERBODY FUSION (PLIF) 1 LEVEL  07/10/2013   Procedure: FOR MAXIMUM ACCESS (MAS) POSTERIOR LUMBAR INTERBODY FUSION (PLIF) LUMBAR FOUR-FIVE;  Surgeon: Tia Alert, MD;  Location: [facility];  Service: Neurosurgery;;  FOR MAXIMUM ACCESS (MAS) POSTERIOR LUMBAR INTERBODY FUSION (PLIF) LUMBAR FOUR-FIVE   OOPHORECTOMY     LSO   OPEN REDUCTION INTERNAL FIXATION (ORIF) DISTAL RADIAL FRACTURE Left 09/20/2018   Procedure: OPEN REDUCTION INTERNAL FIXATION (ORIF) DISTAL RADIAL FRACTURE, LEFT;  Surgeon: Kennedy Bucker, MD;  Location: [facility];  Service: Orthopedics;  Laterality: Left;   PLANTAR FASCIA SURGERY  1999   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   VAGINAL HYSTERECTOMY  2001   for bleeding and LSO for cyst   Patient Active Problem List   Diagnosis Date Noted   Reactive hypoglycemia 02/16/2022   Prediabetes 02/16/2022   Chronic venous insufficiency 12/23/2021   Lymphedema 12/13/2021   Panic attacks    Strain of knee 12/24/2019   Vitamin B12 deficiency 10/08/2019   Arthritis 07/16/2019   Edema 07/16/2019   History of abdominal pain 07/16/2019   Low back pain 07/16/2019   Vitamin D deficiency 07/16/2019   Hypothyroidism 02/28/2019   History of gastric bypass 02/28/2019   Obstructive sleep apnea syndrome 02/28/2019   Insomnia 02/28/2019    Class 3 severe obesity due to excess calories with serious comorbidity and body mass index (BMI) of 40.0 to 44.9 in adult (HCC) 02/28/2019   Elevated BP without diagnosis of hypertension 02/28/2019   Mixed hyperlipidemia 02/28/2019   Hot flashes 02/28/2019   BMI 40.0-44.9, adult (HCC) 12/16/2015   Hematoma of lower extremity 11/12/2015   Essential hypertension 06/20/2014   Pure hypercholesterolemia 06/20/2014   S/P lumbar spinal fusion 07/10/2013   Arthrodesis status 07/10/2013   Palpitations 01/02/2012   Edema of both legs 01/02/2012    PCP: Joni Reining, PA-C  REFERRING PROVIDER: Foster Simpson, DO  REFERRING DIAG: I89.0  THERAPY DIAG:  Lymphedema, not elsewhere classified  Rationale for Evaluation and Treatment: Rehabilitation  ONSET DATE: Insidious onset with progression over time. Greater than 20 years  SUBJECTIVE:                                                                                                                                                                                          SUBJECTIVE STATEMENT:Mary [name] presents to OT visit to address BLE lipo-lymphedema. Pt is accompanied by her supportive spouse, Mary [name]. Pt denies LE-related pain in her legs. Pt reports she has a stress headache.  PERTINENT HISTORY and contributing factors include, OA, HTN, Panic attacks 2/2 restrictive clothing, OSA (not using CPAP), s/p hysterectomy 1987, L knee sx, oophorectomy, s/p hysterectomy 2001, Gastric Bypass 2007, s/p plantar fascia sx 1999, obesity, insomnia  PAIN:  Are you having LE-related pain? No: NPRS scale: 0/10. Pt reports a headache at 8/10. She suspects high BP and stress. Pain location: BLE- generalized, knees Pain description: discomfort, heavy, tired, full, sore Aggravating factors: standing, walking, extended dependent sitting Relieving factors: elevation  PRECAUTIONS: Other: LYMPHEDEMA PRECAUTIONS: HYPOTHYROID, DIABETES SKIN  PRECAUTIONS  WEIGHT BEARING RESTRICTIONS: No  FALLS:  Has patient fallen since last visit? 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  BLE COMPARATIVE LIMB VOLUMETRICS:  INTAKE: 11/01/22  LANDMARK RIGHT    R LEG (A-D) 5947.2 ml  R THIGH (E-G) 9750.0 ml  R FULL LIMB (A-G) 15697.2 ml  Limb Volume differential (LVD)  %  Volume change since initial %  Volume change overall V  (Blank rows = not tested)  LANDMARK LEFT   L LEG (A-D) 6011.6 ml  L THIGH (E-G) 9602.5 ml  L FULL LIMB (A-G) 15614.1 ml  Limb Volume differential (LVD)  LEG LVD = 1.8%, L>R % THIGH LVD = 1.5%, R>L FULL LIMB LVD = 0.53%, R>L  Volume change since initial %  Volume change overall %  (Blank rows = not tested)   9th VISIT 11/24/22  LANDMARK LEFT   L LEG (A-D) 5918.6 ml  L THIGH (E-G) 9265.5 ml  L FULL LIMB (A-G) 15184.1  ml  Limb Volume differential (LVD)  LEG volume = Decreased 1.5%;  THIGH volume decreased 3.5 %, AND  L FULL LIMB VOLUME dec BY 2.8 % SINCE 11/01/22  Volume change since initial %  Volume change overall %    20 th VISIT 01/11/23  LANDMARK LEFT   L LEG (A-D) 5181.2 ml  L THIGH (E-G)  ml  L FULL LIMB (A-G)  ml  Limb Volume differential (LVD)  L LEG (A-D) volume = Decreased 12.4% since 11/24/22. 10% GOAL MET. Did not measure L thigh or full limb today since we have not been bandaging above the knee.   Volume change since initial L LEG (A-D) volume reduction since commencing CDT on 09/02/22 measures 13.8% 10% GOAL MET.   30 th VISIT 01/11/23  LANDMARK RLE  L LEG (A-D) 5691.5  ml  L THIGH (E-G)  ml  L FULL LIMB (A-G)  ml  Limb Volume since initial 11/01/22 DECREASED BY  4.3%  Volume change overall     TODAY'S TREATMENT:     MLD to RLE/RLQ as established. Simultaneous skin care to limit infection. 3. RLE multilayer , knee length gradient compression  bandaging 4. Pt/ FAMILY EDU  PATIENT EDUCATION:  Continued Pt/ CG edu for lymphedema self care home program throughout session. Topics include outcome of comparative limb volumetrics- starting vs changing limb volumes over time; technology and gradient techniques used for short stretch, multilayer compression wrapping, simple self-MLD, therapeutic lymphatic pumping exercises, skin/nail care, LE precautions,. compression garment recommendations and specifications, donning and doffing gadgets, wear and care schedule and compression garment donning / doffing w assistive devices. Discussed progress towards all OT goals since commencing CDT. All questions answered to the Pt's satisfaction w comfort and fit. Good return. Person educated: Patient and Spouse Education method: Explanation, Demonstration, and Handouts Education comprehension: verbalized understanding, returned demonstration, verbal cues required, and needs further education   HOME EXERCISE PROGRAM: Lymphatic Pumping Therex-  1 set of 10, 2 x daily, in order, seated  Daily skin inspection and care with low ph lotion matching skin ph to limit infection risk Simple Self-MLD 1 x daily During Intensive Phase CDT: multilayer compression wraps from foot to groin using gradient techniques, one limb at a time  to ensure safety During Self -Management Phase CDT: Fit with custom daytime compression garments: Elvarex, custom, flat knit,  ccl 2 ( 23-32 mmHg) knee highs. Consider pairing with Compreshorts Capri length pantyhose for containment of buttocks, thighs and abdomen.  Fit with BLE knee length Jobst RELAX w/ open toe and zipper to limit ongoing tissue fibrosis formation and facilitate increased lymphatic function during HOS  Custom-made gradient compression  garments and HOS devices are medically necessary in this case because they are uniquely sized and shaped to fit the exact dimensions of the affected extremities, and to provide accurate and consistent gradient compression essential for optimally managing this patient's symptoms of chronic, progressive lymphedema. Multiple custom compression garments are needed for optimal hygiene. Custom compression garments should be replaced q 3-6 months When worn consistently for optimal effectiveness.  ASSESSMENT: Pt tolerated all aspects of manual therapy today without pain. As typical we performed simultaneous skin care with low ph castor oil throughout MLD to decrease infection risk. Reapplied RLE knee length wraps as established. Fit R custom stocking ASAP. Awaiting delivery from DME vendor. Cont as per POC.  OBJECTIVE IMPAIRMENTS: Abnormal gait, decreased activity tolerance, decreased balance, decreased knowledge of condition, decreased knowledge of use of DME, decreased mobility, difficulty walking, decreased ROM, increased edema, impaired sensation, obesity, pain, and chronic , progressive swelling and pain of buttocks, hips, abdomen and legs .   ACTIVITY LIMITATIONS: carrying, lifting, bending, sitting, standing, squatting, sleeping, stairs, transfers, bed mobility, bathing, dressing, hygiene/grooming, caring for others, and leisure pursuits, social participation, productive/ work activities  PARTICIPATION LIMITATIONS: meal prep, cleaning, laundry, interpersonal relationship, driving, shopping, community activity, occupation, yard work, and church  PERSONAL FACTORS: Age, Past/current experiences, hx of panic attacks and claustrophobia, Time since onset of injury/illness/exacerbation,, motivation, supportive spouse and family are also affecting patient's functional outcome.   REHAB POTENTIAL: Fair Unfortunately the Lipedema does not respond to CDT and is unchanged by this protocol. Fortunately lymphedema  does  respond to differing degrees based on the quality and amount of fatty fibrosis present obstructing lymphatics, and patient's tolerance if skin is hypersensitive.   EVALUATION COMPLEXITY: Moderate   GOALS: Goals reviewed with patient? Yes  SHORT TERM GOALS: Target date: 4th OT Rx visit   Pt will demonstrate understanding of lymphedema precautions and prevention strategies with modified independence using a printed reference to identify at least 5 precautions and discussing how s/he may implement them into daily life to reduce risk of progression with modified assistance ( printed reference). Baseline: Max A Goal status: 12/12/22 PROGRESSING 01/11/23 GOAL MET  2.  Pt will be able to apply multilayer, thigh length, compression wraps using gradient techniques with Max caregiver assistance to decrease limb volume, to limit infection risk, and to limit lymphedema progression.  Baseline: Dependent Goal status: 11/10/22 Goal Met  LONG TERM GOALS: Target date: 04/24/23  Given this patient's Intake score of 36.76 % on the Lymphedema Life Impact Scale (LLIS), patient will experience a reduction of at least 5 points in her perceived level of functional impairment resulting from lymphedema to improve functional performance and quality of life (QOL). Baseline: 36.76% Goal status: 02/20/23 PROGRESSING  2.  Given this patient's Intake score of 56/100% on the functional outcomes FOTO tool, patient will experience an increase in function of 3 points to improve basic and instrumental ADLs performance, including lymphedema self-care.  Baseline: 56% Goal status: 02/20/23 PROGRESSING  3.  During Intensive phase CDT Pt will achieve at least 85% compliance with all lymphedema self-care home program components, including  daily skin care, multilayer , gradient compression wraps with daily changes, daily simple self MLD  and daily lymphatic pumping therex to achieve optimal clinical outcome and to habituate self care  regime for optimal LE self-management over time. Baseline: Dependent Goal status: 01/09/23 GOAL MET  4.  Pt will achieve at least a 10% volume reductions bilaterally below the knees to return limb to more typical size and shape, to limit infection risk and LE progression, to decrease pain, to improve function, and to improve body image and QOL. Baseline: Dependent Goal status: 12/12/22 PROGRESSING. 01/11/23 GOAL MET for L LEG  5.  Pt will be able to don and doff appropriate compression garments and devices using assistive devices and extra time within 1 week of issue date for optimal lymphedema self-care. Baseline: Dependent Goal status: 01/25/23 GOAL MET   PLAN:  PT FREQUENCY: 2x/week  PT DURATION: 12  weeks other: and PRN  PLANNED INTERVENTIONS: Therapeutic exercises, Therapeutic activity, Patient/Family education, Self Care, DME instructions, Manual lymph drainage, Compression bandaging, Manual therapy, and skin care during MLD, fit with appropriate custom compression garments and devices, fit with appropriate compression device  which  follows lymphatic pathways and anatomic distribution  PLAN FOR NEXT SESSION:  MLD Compression Skin care Pt/ family edu for LE self care   Loel Dubonnet, MS, OTR/L, CLT-LANA 03/09/23 4:04 PM

## 2023-03-14 ENCOUNTER — Ambulatory Visit: Payer: 59 | Attending: Plastic Surgery | Admitting: Occupational Therapy

## 2023-03-14 DIAGNOSIS — I890 Lymphedema, not elsewhere classified: Secondary | ICD-10-CM | POA: Insufficient documentation

## 2023-03-14 NOTE — Therapy (Signed)
OUTPATIENT OCCUPATIONAL THERAPY TREATMENT NOTE LOWER EXTREMITY LYMPHEDEMA  Patient Name: Mary [name] MRN: [id number] DOB:02/10/1961, 62 y.o., female Today's Date: 03/14/2023   END OF SESSION:  OT End of Session - 03/14/23 1508     Visit Number 36    Number of Visits 72    Date for OT Re-Evaluation 04/24/23    OT Start Time 0300    OT Stop Time 0400    OT Time Calculation (min) 60 min    Activity Tolerance Patient tolerated treatment well;No increased pain    Behavior During Therapy WFL for tasks assessed/performed                 Past Medical History:  Diagnosis Date   Anemia    low iron at times   Arthritis    Colon polyps    Complication of anesthesia 1991   Pt reports she awoke during plantar fascia surgery and felt "everything"   Heart murmur    " small per patient"    Hyperlipidemia    Hypertension    Hypothyroidism    Lactose intolerance    PAC (premature atrial contraction)    Palpitations    Panic attacks    Pt reports restrictive clothing/areas and dry mouth cause her to have attack.   Peripheral vascular disease (HCC)    "small" clot after thermal ablasion   Pneumonia 2016   "walking pneumonia" 3 times in 2016   Shortness of breath    going upstairs (due to weight). not so much since weight loss   Sleep apnea    after weight loss does not have to use cpap   Vitamin B12 deficiency    Vitamin D deficiency    Past Surgical History:  Procedure Laterality Date   APPENDECTOMY  1985   CESAREAN SECTION  1987   twins   COLONOSCOPY     COLONOSCOPY WITH PROPOFOL N/A 02/27/2018   Procedure: COLONOSCOPY WITH PROPOFOL;  Surgeon: Toledo, Boykin Nearing, MD;  Location: [facility];  Service: Gastroenterology;  Laterality: N/A;   IRRIGATION AND DEBRIDEMENT HEMATOMA Right 11/12/2015   Procedure: IRRIGATION AND DEBRIDEMENT HEMATOMA;  Surgeon: Earline Mayotte, MD;  Location: [facility];  Service: General;  Laterality: Right;   KNEE ARTHROSCOPY WITH MEDIAL  MENISECTOMY Left 07/15/2020   Procedure: Left knee arthroscopy with partial medial or lateral menisectomy, possible chondroplasty, possible partial synovectomy;  Surgeon: Lyndle Herrlich, MD;  Location: [facility];  Service: Orthopedics;  Laterality: Left;   LAMINECTOMY  07/10/2013   L4   L5   DR Yetta Barre   LAPAROSCOPIC SALPINGO OOPHERECTOMY Right 04/17/2014   Procedure: LAPAROSCOPIC SALPINGO OOPHORECTOMY RIGHT;  Surgeon: Adolphus Birchwood, MD;  Location: [facility];  Service: Gynecology;  Laterality: Right;   LYSIS OF ADHESION  04/17/2014   Procedure: LYSIS OF ADHESION;  Surgeon: Adolphus Birchwood, MD;  Location: [facility];  Service: Gynecology;;   MAXIMUM ACCESS (MAS)POSTERIOR LUMBAR INTERBODY FUSION (PLIF) 1 LEVEL  07/10/2013   Procedure: FOR MAXIMUM ACCESS (MAS) POSTERIOR LUMBAR INTERBODY FUSION (PLIF) LUMBAR FOUR-FIVE;  Surgeon: Tia Alert, MD;  Location: [facility];  Service: Neurosurgery;;  FOR MAXIMUM ACCESS (MAS) POSTERIOR LUMBAR INTERBODY FUSION (PLIF) LUMBAR FOUR-FIVE   OOPHORECTOMY     LSO   OPEN REDUCTION INTERNAL FIXATION (ORIF) DISTAL RADIAL FRACTURE Left 09/20/2018   Procedure: OPEN REDUCTION INTERNAL FIXATION (ORIF) DISTAL RADIAL FRACTURE, LEFT;  Surgeon: Kennedy Bucker, MD;  Location: [facility];  Service: Orthopedics;  Laterality: Left;   PLANTAR FASCIA SURGERY  1999   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   VAGINAL HYSTERECTOMY  2001   for bleeding and LSO for cyst   Patient Active Problem List   Diagnosis Date Noted   Reactive hypoglycemia 02/16/2022   Prediabetes 02/16/2022   Chronic venous insufficiency 12/23/2021   Lymphedema 12/13/2021   Panic attacks    Strain of knee 12/24/2019   Vitamin B12 deficiency 10/08/2019   Arthritis 07/16/2019   Edema 07/16/2019   History of abdominal pain 07/16/2019   Low back pain 07/16/2019   Vitamin D deficiency 07/16/2019   Hypothyroidism 02/28/2019   History of gastric bypass 02/28/2019   Obstructive sleep apnea syndrome 02/28/2019   Insomnia 02/28/2019    Class 3 severe obesity due to excess calories with serious comorbidity and body mass index (BMI) of 40.0 to 44.9 in adult (HCC) 02/28/2019   Elevated BP without diagnosis of hypertension 02/28/2019   Mixed hyperlipidemia 02/28/2019   Hot flashes 02/28/2019   BMI 40.0-44.9, adult (HCC) 12/16/2015   Hematoma of lower extremity 11/12/2015   Essential hypertension 06/20/2014   Pure hypercholesterolemia 06/20/2014   S/P lumbar spinal fusion 07/10/2013   Arthrodesis status 07/10/2013   Palpitations 01/02/2012   Edema of both legs 01/02/2012    PCP: Joni Reining, PA-C  REFERRING PROVIDER: Foster Simpson, DO  REFERRING DIAG: I89.0  THERAPY DIAG:  Lymphedema, not elsewhere classified  Rationale for Evaluation and Treatment: Rehabilitation  ONSET DATE: Insidious onset with progression over time. Greater than 20 years  SUBJECTIVE:                                                                                                                                                                                          SUBJECTIVE STATEMENT:Mary [name] presents to OT visit to address BLE lipo-lymphedema. Pt is accompanied by her supportive spouse, Mary [name]. Pt denies LE-related pain in her legs. Pt has questions about differences in advanced and basic compression devices.  PERTINENT HISTORY and contributing factors include, OA, HTN, Panic attacks 2/2 restrictive clothing, OSA (not using CPAP), s/p hysterectomy 1987, L knee sx, oophorectomy, s/p hysterectomy 2001, Gastric Bypass 2007, s/p plantar fascia sx 1999, obesity, insomnia  PAIN:  Are you having LE-related pain? No: NPRS scale: 0/10. Pt reports a headache at 8/10. She suspects high BP and stress. Pain location: BLE- generalized, knees Pain description: discomfort, heavy, tired, full, sore Aggravating factors: standing, walking, extended dependent sitting Relieving factors: elevation  PRECAUTIONS: Other: LYMPHEDEMA PRECAUTIONS:  HYPOTHYROID, DIABETES SKIN PRECAUTIONS  WEIGHT BEARING RESTRICTIONS: No  FALLS:  Has patient fallen since last visit? 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  BLE COMPARATIVE LIMB VOLUMETRICS:  INTAKE: 11/01/22  LANDMARK RIGHT    R LEG (A-D) 5947.2 ml  R THIGH (E-G) 9750.0 ml  R FULL LIMB (A-G) 15697.2 ml  Limb Volume differential (LVD)  %  Volume change since initial %  Volume change overall V  (Blank rows = not tested)  LANDMARK LEFT   L LEG (A-D) 6011.6 ml  L THIGH (E-G) 9602.5 ml  L FULL LIMB (A-G) 15614.1 ml  Limb Volume differential (LVD)  LEG LVD = 1.8%, L>R % THIGH LVD = 1.5%, R>L FULL LIMB LVD = 0.53%, R>L  Volume change since initial %  Volume change overall %  (Blank rows = not tested)   9th VISIT 11/24/22  LANDMARK LEFT   L LEG (A-D) 5918.6 ml  L THIGH (E-G) 9265.5 ml  L FULL LIMB (A-G) 15184.1  ml  Limb Volume differential (LVD)  LEG volume = Decreased 1.5%;  THIGH volume decreased 3.5 %, AND  L FULL LIMB VOLUME dec BY 2.8 % SINCE 11/01/22  Volume change since initial %  Volume change overall %    20 th VISIT 01/11/23  LANDMARK LEFT   L LEG (A-D) 5181.2 ml  L THIGH (E-G)  ml  L FULL LIMB (A-G)  ml  Limb Volume differential (LVD)  L LEG (A-D) volume = Decreased 12.4% since 11/24/22. 10% GOAL MET. Did not measure L thigh or full limb today since we have not been bandaging above the knee.   Volume change since initial L LEG (A-D) volume reduction since commencing CDT on 09/02/22 measures 13.8% 10% GOAL MET.   30 th VISIT 01/11/23  LANDMARK RLE  L LEG (A-D) 5691.5  ml  L THIGH (E-G)  ml  L FULL LIMB (A-G)  ml  Limb Volume since  initial 11/01/22 DECREASED BY 4.3%  Volume change overall     TODAY'S TREATMENT:     MLD to RLE/RLQ as established. Simultaneous skin care to limit infection. 3. RLE multilayer , knee length gradient compression  bandaging 4. Pt/ FAMILY EDU  PATIENT EDUCATION:  Continued Pt/ CG edu for lymphedema self care home program throughout session. Topics include outcome of comparative limb volumetrics- starting vs changing limb volumes over time; technology and gradient techniques used for short stretch, multilayer compression wrapping, simple self-MLD, therapeutic lymphatic pumping exercises, skin/nail care, LE precautions,. compression garment recommendations and specifications, donning and doffing gadgets, wear and care schedule and compression garment donning / doffing w assistive devices. Discussed progress towards all OT goals since commencing CDT. All questions answered to the Pt's satisfaction w comfort and fit. Good return. Person educated: Patient and Spouse Education method: Explanation, Demonstration, and Handouts Education comprehension: verbalized understanding, returned demonstration, verbal cues required, and needs further education   HOME EXERCISE PROGRAM: Lymphatic Pumping Therex-  1 set of 10, 2 x daily, in order, seated  Daily skin inspection and care with low ph lotion matching skin ph to limit infection risk Simple Self-MLD 1 x daily During Intensive Phase CDT: multilayer compression wraps from foot to groin using gradient techniques, one limb at a time  to ensure safety During Self -Management Phase CDT: Fit with custom daytime compression garments:  Elvarex, custom, flat knit, ccl 2 ( 23-32 mmHg) knee highs. Consider pairing with Compreshorts Capri length pantyhose for containment of buttocks, thighs and abdomen.  Fit with BLE knee length Jobst RELAX w/ open toe and zipper to limit ongoing tissue fibrosis formation and facilitate increased lymphatic function during  HOS  Custom-made gradient compression garments and HOS devices are medically necessary in this case because they are uniquely sized and shaped to fit the exact dimensions of the affected extremities, and to provide accurate and consistent gradient compression essential for optimally managing this patient's symptoms of chronic, progressive lymphedema. Multiple custom compression garments are needed for optimal hygiene. Custom compression garments should be replaced q 3-6 months When worn consistently for optimal effectiveness.  ASSESSMENT: Pt tolerated all aspects of MLD and fibrosis techniques today without pain. Performed simultaneous skin care with low ph castor oil throughout MLD to decrease infection risk. Reapplied RLE knee length wraps as established. Fit R custom stocking ASAP. Awaiting delivery from DME vendor. Cont as per POC.  OBJECTIVE IMPAIRMENTS: Abnormal gait, decreased activity tolerance, decreased balance, decreased knowledge of condition, decreased knowledge of use of DME, decreased mobility, difficulty walking, decreased ROM, increased edema, impaired sensation, obesity, pain, and chronic , progressive swelling and pain of buttocks, hips, abdomen and legs .   ACTIVITY LIMITATIONS: carrying, lifting, bending, sitting, standing, squatting, sleeping, stairs, transfers, bed mobility, bathing, dressing, hygiene/grooming, caring for others, and leisure pursuits, social participation, productive/ work activities  PARTICIPATION LIMITATIONS: meal prep, cleaning, laundry, interpersonal relationship, driving, shopping, community activity, occupation, yard work, and church  PERSONAL FACTORS: Age, Past/current experiences, hx of panic attacks and claustrophobia, Time since onset of injury/illness/exacerbation,, motivation, supportive spouse and family are also affecting patient's functional outcome.   REHAB POTENTIAL: Fair Unfortunately the Lipedema does not respond to CDT and is unchanged by this  protocol. Fortunately lymphedema  does respond to differing degrees based on the quality and amount of fatty fibrosis present obstructing lymphatics, and patient's tolerance if skin is hypersensitive.   EVALUATION COMPLEXITY: Moderate   GOALS: Goals reviewed with patient? Yes  SHORT TERM GOALS: Target date: 4th OT Rx visit   Pt will demonstrate understanding of lymphedema precautions and prevention strategies with modified independence using a printed reference to identify at least 5 precautions and discussing how s/he may implement them into daily life to reduce risk of progression with modified assistance ( printed reference). Baseline: Max A Goal status: 12/12/22 PROGRESSING 01/11/23 GOAL MET  2.  Pt will be able to apply multilayer, thigh length, compression wraps using gradient techniques with Max caregiver assistance to decrease limb volume, to limit infection risk, and to limit lymphedema progression.  Baseline: Dependent Goal status: 11/10/22 Goal Met  LONG TERM GOALS: Target date: 04/24/23  Given this patient's Intake score of 36.76 % on the Lymphedema Life Impact Scale (LLIS), patient will experience a reduction of at least 5 points in her perceived level of functional impairment resulting from lymphedema to improve functional performance and quality of life (QOL). Baseline: 36.76% Goal status: 02/20/23 PROGRESSING  2.  Given this patient's Intake score of 56/100% on the functional outcomes FOTO tool, patient will experience an increase in function of 3 points to improve basic and instrumental ADLs performance, including lymphedema self-care.  Baseline: 56% Goal status: 02/20/23 PROGRESSING  3.  During Intensive phase CDT Pt will achieve at least 85% compliance with all lymphedema self-care home program components, including  daily skin care, multilayer , gradient compression wraps with daily changes, daily  simple self MLD and daily lymphatic pumping therex to achieve optimal clinical  outcome and to habituate self care regime for optimal LE self-management over time. Baseline: Dependent Goal status: 01/09/23 GOAL MET  4.  Pt will achieve at least a 10% volume reductions bilaterally below the knees to return limb to more typical size and shape, to limit infection risk and LE progression, to decrease pain, to improve function, and to improve body image and QOL. Baseline: Dependent Goal status: 12/12/22 PROGRESSING. 01/11/23 GOAL MET for L LEG  5.  Pt will be able to don and doff appropriate compression garments and devices using assistive devices and extra time within 1 week of issue date for optimal lymphedema self-care. Baseline: Dependent Goal status: 01/25/23 GOAL MET   PLAN:  PT FREQUENCY: 2x/week  PT DURATION: 12  weeks other: and PRN  PLANNED INTERVENTIONS: Therapeutic exercises, Therapeutic activity, Patient/Family education, Self Care, DME instructions, Manual lymph drainage, Compression bandaging, Manual therapy, and skin care during MLD, fit with appropriate custom compression garments and devices, fit with appropriate compression device  which  follows lymphatic pathways and anatomic distribution  PLAN FOR NEXT SESSION:  MLD Compression Skin care Pt/ family edu for LE self care   Loel Dubonnet, MS, OTR/L, CLT-LANA 03/14/23 3:59 PM

## 2023-03-16 ENCOUNTER — Ambulatory Visit: Payer: 59 | Admitting: Occupational Therapy

## 2023-03-16 DIAGNOSIS — I890 Lymphedema, not elsewhere classified: Secondary | ICD-10-CM | POA: Diagnosis not present

## 2023-03-16 NOTE — Therapy (Signed)
OUTPATIENT OCCUPATIONAL THERAPY TREATMENT NOTE LOWER EXTREMITY LYMPHEDEMA  Patient Name: Mary [name] MRN: [id number] DOB:[birth year], 62 y.o., female Today's Date: 03/16/2023   END OF SESSION:  OT End of Session - 03/16/23 1604     Visit Number 37    Number of Visits 72    Date for OT Re-Evaluation 04/24/23    OT Start Time 0300    OT Stop Time 0400    OT Time Calculation (min) 60 min    Activity Tolerance Patient tolerated treatment well;No increased pain    Behavior During Therapy WFL for tasks assessed/performed                 Past Medical History:  Diagnosis Date   Anemia    low iron at times   Arthritis    Colon polyps    Complication of anesthesia 1991   Pt reports she awoke during plantar fascia surgery and felt "everything"   Heart murmur    " small per patient"    Hyperlipidemia    Hypertension    Hypothyroidism    Lactose intolerance    PAC (premature atrial contraction)    Palpitations    Panic attacks    Pt reports restrictive clothing/areas and dry mouth cause her to have attack.   Peripheral vascular disease (HCC)    "small" clot after thermal ablasion   Pneumonia 2016   "walking pneumonia" 3 times in 2016   Shortness of breath    going upstairs (due to weight). not so much since weight loss   Sleep apnea    after weight loss does not have to use cpap   Vitamin B12 deficiency    Vitamin D deficiency    Past Surgical History:  Procedure Laterality Date   APPENDECTOMY  1985   CESAREAN SECTION  1987   twins   COLONOSCOPY     COLONOSCOPY WITH PROPOFOL N/A 02/27/2018   Procedure: COLONOSCOPY WITH PROPOFOL;  Surgeon: Toledo, Boykin Nearing, MD;  Location: [facility];  Service: Gastroenterology;  Laterality: N/A;   IRRIGATION AND DEBRIDEMENT HEMATOMA Right 11/12/2015   Procedure: IRRIGATION AND DEBRIDEMENT HEMATOMA;  Surgeon: Earline Mayotte, MD;  Location: [facility];  Service: General;  Laterality: Right;   KNEE ARTHROSCOPY WITH MEDIAL  MENISECTOMY Left 07/15/2020   Procedure: Left knee arthroscopy with partial medial or lateral menisectomy, possible chondroplasty, possible partial synovectomy;  Surgeon: Lyndle Herrlich, MD;  Location: [facility];  Service: Orthopedics;  Laterality: Left;   LAMINECTOMY  07/10/2013   L4   L5   DR Yetta Barre   LAPAROSCOPIC SALPINGO OOPHERECTOMY Right 04/17/2014   Procedure: LAPAROSCOPIC SALPINGO OOPHORECTOMY RIGHT;  Surgeon: Adolphus Birchwood, MD;  Location: [facility];  Service: Gynecology;  Laterality: Right;   LYSIS OF ADHESION  04/17/2014   Procedure: LYSIS OF ADHESION;  Surgeon: Adolphus Birchwood, MD;  Location: [facility];  Service: Gynecology;;   MAXIMUM ACCESS (MAS)POSTERIOR LUMBAR INTERBODY FUSION (PLIF) 1 LEVEL  07/10/2013   Procedure: FOR MAXIMUM ACCESS (MAS) POSTERIOR LUMBAR INTERBODY FUSION (PLIF) LUMBAR FOUR-FIVE;  Surgeon: Tia Alert, MD;  Location: [facility];  Service: Neurosurgery;;  FOR MAXIMUM ACCESS (MAS) POSTERIOR LUMBAR INTERBODY FUSION (PLIF) LUMBAR FOUR-FIVE   OOPHORECTOMY     LSO   OPEN REDUCTION INTERNAL FIXATION (ORIF) DISTAL RADIAL FRACTURE Left 09/20/2018   Procedure: OPEN REDUCTION INTERNAL FIXATION (ORIF) DISTAL RADIAL FRACTURE, LEFT;  Surgeon: Kennedy Bucker, MD;  Location: [facility];  Service: Orthopedics;  Laterality: Left;   PLANTAR FASCIA SURGERY  1999   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   VAGINAL HYSTERECTOMY  2001   for bleeding and LSO for cyst   Patient Active Problem List   Diagnosis Date Noted   Reactive hypoglycemia 02/16/2022   Prediabetes 02/16/2022   Chronic venous insufficiency 12/23/2021   Lymphedema 12/13/2021   Panic attacks    Strain of knee 12/24/2019   Vitamin B12 deficiency 10/08/2019   Arthritis 07/16/2019   Edema 07/16/2019   History of abdominal pain 07/16/2019   Low back pain 07/16/2019   Vitamin D deficiency 07/16/2019   Hypothyroidism 02/28/2019   History of gastric bypass 02/28/2019   Obstructive sleep apnea syndrome 02/28/2019   Insomnia 02/28/2019    Class 3 severe obesity due to excess calories with serious comorbidity and body mass index (BMI) of 40.0 to 44.9 in adult (HCC) 02/28/2019   Elevated BP without diagnosis of hypertension 02/28/2019   Mixed hyperlipidemia 02/28/2019   Hot flashes 02/28/2019   BMI 40.0-44.9, adult (HCC) 12/16/2015   Hematoma of lower extremity 11/12/2015   Essential hypertension 06/20/2014   Pure hypercholesterolemia 06/20/2014   S/P lumbar spinal fusion 07/10/2013   Arthrodesis status 07/10/2013   Palpitations 01/02/2012   Edema of both legs 01/02/2012    PCP: Joni Reining, PA-C  REFERRING PROVIDER: Foster Simpson, DO  REFERRING DIAG: I89.0  THERAPY DIAG:  Lymphedema, not elsewhere classified  Rationale for Evaluation and Treatment: Rehabilitation  ONSET DATE: Insidious onset with progression over time. Greater than 20 years  SUBJECTIVE:                                                                                                                                                                                          SUBJECTIVE STATEMENT:Mary [name] presents to OT visit to address BLE lipo-lymphedema. Pt is accompanied by her supportive spouse, [name]. Pt denies LE-related pain in her legs. Pt has questions about differences in advanced and basic compression devices.  PERTINENT HISTORY and contributing factors include, OA, HTN, Panic attacks 2/2 restrictive clothing, OSA (not using CPAP), s/p hysterectomy 1987, L knee sx, oophorectomy, s/p hysterectomy 2001, Gastric Bypass 2007, s/p plantar fascia sx 1999, obesity, insomnia  PAIN:  Are you having LE-related pain? No: NPRS scale: 0/10. Pt reports a headache at 8/10. She suspects high BP and stress. Pain location: BLE- generalized, knees Pain description: discomfort, heavy, tired, full, sore Aggravating factors: standing, walking, extended dependent sitting Relieving factors: elevation  PRECAUTIONS: Other: LYMPHEDEMA PRECAUTIONS:  HYPOTHYROID, DIABETES SKIN PRECAUTIONS  WEIGHT BEARING RESTRICTIONS: No  FALLS:  Has patient fallen since last visit? 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  BLE COMPARATIVE LIMB VOLUMETRICS:  INTAKE: 11/01/22  LANDMARK RIGHT    R LEG (A-D) 5947.2 ml  R THIGH (E-G) 9750.0 ml  R FULL LIMB (A-G) 15697.2 ml  Limb Volume differential (LVD)  %  Volume change since initial %  Volume change overall V  (Blank rows = not tested)  LANDMARK LEFT   L LEG (A-D) 6011.6 ml  L THIGH (E-G) 9602.5 ml  L FULL LIMB (A-G) 15614.1 ml  Limb Volume differential (LVD)  LEG LVD = 1.8%, L>R % THIGH LVD = 1.5%, R>L FULL LIMB LVD = 0.53%, R>L  Volume change since initial %  Volume change overall %  (Blank rows = not tested)   9th VISIT 11/24/22  LANDMARK LEFT   L LEG (A-D) 5918.6 ml  L THIGH (E-G) 9265.5 ml  L FULL LIMB (A-G) 15184.1  ml  Limb Volume differential (LVD)  LEG volume = Decreased 1.5%;  THIGH volume decreased 3.5 %, AND  L FULL LIMB VOLUME dec BY 2.8 % SINCE 11/01/22  Volume change since initial %  Volume change overall %    20 th VISIT 01/11/23  LANDMARK LEFT   L LEG (A-D) 5181.2 ml  L THIGH (E-G)  ml  L FULL LIMB (A-G)  ml  Limb Volume differential (LVD)  L LEG (A-D) volume = Decreased 12.4% since 11/24/22. 10% GOAL MET. Did not measure L thigh or full limb today since we have not been bandaging above the knee.   Volume change since initial L LEG (A-D) volume reduction since commencing CDT on 09/02/22 measures 13.8% 10% GOAL MET.   30 th VISIT 01/11/23  LANDMARK RLE  L LEG (A-D) 5691.5  ml  L THIGH (E-G)  ml  L FULL LIMB (A-G)  ml  Limb Volume since  initial 11/01/22 DECREASED BY 4.3%  Volume change overall     TODAY'S TREATMENT:     MLD to RLE/RLQ as established. Simultaneous skin care to limit infection. 3. RLE multilayer , knee length gradient compression  bandaging 4. Pt/ FAMILY EDU  PATIENT EDUCATION:  Continued Pt/ CG edu for lymphedema self care home program throughout session. Topics include outcome of comparative limb volumetrics- starting vs changing limb volumes over time; technology and gradient techniques used for short stretch, multilayer compression wrapping, simple self-MLD, therapeutic lymphatic pumping exercises, skin/nail care, LE precautions,. compression garment recommendations and specifications, donning and doffing gadgets, wear and care schedule and compression garment donning / doffing w assistive devices. Discussed progress towards all OT goals since commencing CDT. All questions answered to the Pt's satisfaction w comfort and fit. Good return. Person educated: Patient and Spouse Education method: Explanation, Demonstration, and Handouts Education comprehension: verbalized understanding, returned demonstration, verbal cues required, and needs further education   HOME EXERCISE PROGRAM: Lymphatic Pumping Therex-  1 set of 10, 2 x daily, in order, seated  Daily skin inspection and care with low ph lotion matching skin ph to limit infection risk Simple Self-MLD 1 x daily During Intensive Phase CDT: multilayer compression wraps from foot to groin using gradient techniques, one limb at a time  to ensure safety During Self -Management Phase CDT: Fit with custom daytime compression garments:  Elvarex, custom, flat knit, ccl 2 ( 23-32 mmHg) knee highs. Consider pairing with Compreshorts Capri length pantyhose for containment of buttocks, thighs and abdomen.  Fit with BLE knee length Jobst RELAX w/ open toe and zipper to limit ongoing tissue fibrosis formation and facilitate increased lymphatic function during  HOS  Custom-made gradient compression garments and HOS devices are medically necessary in this case because they are uniquely sized and shaped to fit the exact dimensions of the affected extremities, and to provide accurate and consistent gradient compression essential for optimally managing this patient's symptoms of chronic, progressive lymphedema. Multiple custom compression garments are needed for optimal hygiene. Custom compression garments should be replaced q 3-6 months When worn consistently for optimal effectiveness.  ASSESSMENT: Pt tolerated all aspects of MLD and fibrosis techniques today without pain. Performed simultaneous skin care with low ph castor oil throughout MLD to decrease infection risk. Reapplied RLE knee length wraps as established. Fit R custom stocking ASAP.  Cont as per POC.  OBJECTIVE IMPAIRMENTS: Abnormal gait, decreased activity tolerance, decreased balance, decreased knowledge of condition, decreased knowledge of use of DME, decreased mobility, difficulty walking, decreased ROM, increased edema, impaired sensation, obesity, pain, and chronic , progressive swelling and pain of buttocks, hips, abdomen and legs .   ACTIVITY LIMITATIONS: carrying, lifting, bending, sitting, standing, squatting, sleeping, stairs, transfers, bed mobility, bathing, dressing, hygiene/grooming, caring for others, and leisure pursuits, social participation, productive/ work activities  PARTICIPATION LIMITATIONS: meal prep, cleaning, laundry, interpersonal relationship, driving, shopping, community activity, occupation, yard work, and church  PERSONAL FACTORS: Age, Past/current experiences, hx of panic attacks and claustrophobia, Time since onset of injury/illness/exacerbation,, motivation, supportive spouse and family are also affecting patient's functional outcome.   REHAB POTENTIAL: Fair Unfortunately the Lipedema does not respond to CDT and is unchanged by this protocol. Fortunately lymphedema   does respond to differing degrees based on the quality and amount of fatty fibrosis present obstructing lymphatics, and patient's tolerance if skin is hypersensitive.   EVALUATION COMPLEXITY: Moderate   GOALS: Goals reviewed with patient? Yes  SHORT TERM GOALS: Target date: 4th OT Rx visit   Pt will demonstrate understanding of lymphedema precautions and prevention strategies with modified independence using a printed reference to identify at least 5 precautions and discussing how s/he may implement them into daily life to reduce risk of progression with modified assistance ( printed reference). Baseline: Max A Goal status: 12/12/22 PROGRESSING 01/11/23 GOAL MET  2.  Pt will be able to apply multilayer, thigh length, compression wraps using gradient techniques with Max caregiver assistance to decrease limb volume, to limit infection risk, and to limit lymphedema progression.  Baseline: Dependent Goal status: 11/10/22 Goal Met  LONG TERM GOALS: Target date: 04/24/23  Given this patient's Intake score of 36.76 % on the Lymphedema Life Impact Scale (LLIS), patient will experience a reduction of at least 5 points in her perceived level of functional impairment resulting from lymphedema to improve functional performance and quality of life (QOL). Baseline: 36.76% Goal status: 02/20/23 PROGRESSING  2.  Given this patient's Intake score of 56/100% on the functional outcomes FOTO tool, patient will experience an increase in function of 3 points to improve basic and instrumental ADLs performance, including lymphedema self-care.  Baseline: 56% Goal status: 02/20/23 PROGRESSING  3.  During Intensive phase CDT Pt will achieve at least 85% compliance with all lymphedema self-care home program components, including  daily skin care, multilayer , gradient compression wraps with daily changes, daily simple self MLD and  daily lymphatic pumping therex to achieve optimal clinical outcome and to habituate self care  regime for optimal LE self-management over time. Baseline: Dependent Goal status: 01/09/23 GOAL MET  4.  Pt will achieve at least a 10% volume reductions bilaterally below the knees to return limb to more typical size and shape, to limit infection risk and LE progression, to decrease pain, to improve function, and to improve body image and QOL. Baseline: Dependent Goal status: 12/12/22 PROGRESSING. 01/11/23 GOAL MET for L LEG  5.  Pt will be able to don and doff appropriate compression garments and devices using assistive devices and extra time within 1 week of issue date for optimal lymphedema self-care. Baseline: Dependent Goal status: 01/25/23 GOAL MET   PLAN:  PT FREQUENCY: 2x/week  PT DURATION: 12  weeks other: and PRN  PLANNED INTERVENTIONS: Therapeutic exercises, Therapeutic activity, Patient/Family education, Self Care, DME instructions, Manual lymph drainage, Compression bandaging, Manual therapy, and skin care during MLD, fit with appropriate custom compression garments and devices, fit with appropriate compression device  which  follows lymphatic pathways and anatomic distribution  PLAN FOR NEXT SESSION:  MLD Compression Skin care Pt/ family edu for LE self care   Loel Dubonnet, MS, OTR/L, CLT-LANA 03/16/23 4:06 PM

## 2023-03-20 ENCOUNTER — Other Ambulatory Visit: Payer: Self-pay

## 2023-03-20 DIAGNOSIS — G4700 Insomnia, unspecified: Secondary | ICD-10-CM

## 2023-03-20 MED ORDER — ZOLPIDEM TARTRATE ER 12.5 MG PO TBCR: 12.5000 mg | EXTENDED_RELEASE_TABLET | Freq: Every evening | ORAL | 5 refills | Status: DC | PRN

## 2023-03-21 ENCOUNTER — Ambulatory Visit: Payer: 59 | Admitting: Occupational Therapy

## 2023-03-21 DIAGNOSIS — I890 Lymphedema, not elsewhere classified: Secondary | ICD-10-CM | POA: Diagnosis not present

## 2023-03-21 NOTE — Therapy (Signed)
OUTPATIENT OCCUPATIONAL THERAPY TREATMENT NOTE LOWER EXTREMITY LYMPHEDEMA  Patient Name: Mary [name] MRN: [id number] DOB:[birth year], 62 y.o., female Today's Date: 03/21/2023   END OF SESSION:  OT End of Session - 03/21/23 1603     Visit Number 38    Number of Visits 72    Date for OT Re-Evaluation 04/24/23    OT Start Time 0310    OT Stop Time 0405    OT Time Calculation (min) 55 min    Activity Tolerance Patient tolerated treatment well;No increased pain                 Past Medical History:  Diagnosis Date   Anemia    low iron at times   Arthritis    Colon polyps    Complication of anesthesia 1991   Pt reports she awoke during plantar fascia surgery and felt "everything"   Heart murmur    " small per patient"    Hyperlipidemia    Hypertension    Hypothyroidism    Lactose intolerance    PAC (premature atrial contraction)    Palpitations    Panic attacks    Pt reports restrictive clothing/areas and dry mouth cause her to have attack.   Peripheral vascular disease (HCC)    "small" clot after thermal ablasion   Pneumonia 2016   "walking pneumonia" 3 times in 2016   Shortness of breath    going upstairs (due to weight). not so much since weight loss   Sleep apnea    after weight loss does not have to use cpap   Vitamin B12 deficiency    Vitamin D deficiency    Past Surgical History:  Procedure Laterality Date   APPENDECTOMY  1985   CESAREAN SECTION  1987   twins   COLONOSCOPY     COLONOSCOPY WITH PROPOFOL N/A 02/27/2018   Procedure: COLONOSCOPY WITH PROPOFOL;  Surgeon: Toledo, Boykin Nearing, MD;  Location: [facility];  Service: Gastroenterology;  Laterality: N/A;   IRRIGATION AND DEBRIDEMENT HEMATOMA Right 11/12/2015   Procedure: IRRIGATION AND DEBRIDEMENT HEMATOMA;  Surgeon: Earline Mayotte, MD;  Location: [facility];  Service: General;  Laterality: Right;   KNEE ARTHROSCOPY WITH MEDIAL MENISECTOMY Left 07/15/2020   Procedure: Left knee arthroscopy with  partial medial or lateral menisectomy, possible chondroplasty, possible partial synovectomy;  Surgeon: Lyndle Herrlich, MD;  Location: [facility];  Service: Orthopedics;  Laterality: Left;   LAMINECTOMY  07/10/2013   L4   L5   DR Yetta Barre   LAPAROSCOPIC SALPINGO OOPHERECTOMY Right 04/17/2014   Procedure: LAPAROSCOPIC SALPINGO OOPHORECTOMY RIGHT;  Surgeon: Adolphus Birchwood, MD;  Location: [facility];  Service: Gynecology;  Laterality: Right;   LYSIS OF ADHESION  04/17/2014   Procedure: LYSIS OF ADHESION;  Surgeon: Adolphus Birchwood, MD;  Location: [facility];  Service: Gynecology;;   MAXIMUM ACCESS (MAS)POSTERIOR LUMBAR INTERBODY FUSION (PLIF) 1 LEVEL  07/10/2013   Procedure: FOR MAXIMUM ACCESS (MAS) POSTERIOR LUMBAR INTERBODY FUSION (PLIF) LUMBAR FOUR-FIVE;  Surgeon: Tia Alert, MD;  Location: [facility];  Service: Neurosurgery;;  FOR MAXIMUM ACCESS (MAS) POSTERIOR LUMBAR INTERBODY FUSION (PLIF) LUMBAR FOUR-FIVE   OOPHORECTOMY     LSO   OPEN REDUCTION INTERNAL FIXATION (ORIF) DISTAL RADIAL FRACTURE Left 09/20/2018   Procedure: OPEN REDUCTION INTERNAL FIXATION (ORIF) DISTAL RADIAL FRACTURE, LEFT;  Surgeon: Kennedy Bucker, MD;  Location: [facility];  Service: Orthopedics;  Laterality: Left;   PLANTAR FASCIA SURGERY  1999   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  VAGINAL HYSTERECTOMY  2001   for bleeding and LSO for cyst   Patient Active Problem List   Diagnosis Date Noted   Reactive hypoglycemia 02/16/2022   Prediabetes 02/16/2022   Chronic venous insufficiency 12/23/2021   Lymphedema 12/13/2021   Panic attacks    Strain of knee 12/24/2019   Vitamin B12 deficiency 10/08/2019   Arthritis 07/16/2019   Edema 07/16/2019   History of abdominal pain 07/16/2019   Low back pain 07/16/2019   Vitamin D deficiency 07/16/2019   Hypothyroidism 02/28/2019   History of gastric bypass 02/28/2019   Obstructive sleep apnea syndrome 02/28/2019   Insomnia 02/28/2019   Class 3 severe obesity due to excess calories with serious  comorbidity and body mass index (BMI) of 40.0 to 44.9 in adult (HCC) 02/28/2019   Elevated BP without diagnosis of hypertension 02/28/2019   Mixed hyperlipidemia 02/28/2019   Hot flashes 02/28/2019   BMI 40.0-44.9, adult (HCC) 12/16/2015   Hematoma of lower extremity 11/12/2015   Essential hypertension 06/20/2014   Pure hypercholesterolemia 06/20/2014   S/P lumbar spinal fusion 07/10/2013   Arthrodesis status 07/10/2013   Palpitations 01/02/2012   Edema of both legs 01/02/2012    PCP: Mary Reining, PA-C  REFERRING PROVIDER: Foster Simpson, DO  REFERRING DIAG: I89.0  THERAPY DIAG:  Lymphedema, not elsewhere classified  Rationale for Evaluation and Treatment: Rehabilitation  ONSET DATE: Insidious onset with progression over time. Greater than 20 years  SUBJECTIVE:                                                                                                                                                                                          SUBJECTIVE STATEMENT:Mary [name] presents to OT visit to address BLE lipo-lymphedema. Pt is accompanied by her supportive spouse, Mary [name]. Pt denies LE-related pain in her legs.   PERTINENT HISTORY and contributing factors include, OA, HTN, Panic attacks 2/2 restrictive clothing, OSA (not using CPAP), s/p hysterectomy 1987, L knee sx, oophorectomy, s/p hysterectomy 2001, Gastric Bypass 2007, s/p plantar fascia sx 1999, obesity, insomnia  PAIN:  Are you having LE-related pain? No: NPRS scale: 0/10.  Pain location: BLE- generalized, knees Pain description: discomfort, heavy, tired, full, sore Aggravating factors: standing, walking, extended dependent sitting Relieving factors: elevation  PRECAUTIONS: Other: LYMPHEDEMA PRECAUTIONS: HYPOTHYROID, DIABETES SKIN PRECAUTIONS  WEIGHT BEARING RESTRICTIONS: No  FALLS:  Has patient fallen since last visit? 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  BLE COMPARATIVE LIMB VOLUMETRICS:  INTAKE: 11/01/22  LANDMARK RIGHT    R LEG (A-D) 5947.2 ml  R THIGH (E-G) 9750.0 ml  R FULL LIMB (A-G) 15697.2 ml  Limb Volume differential (LVD)  %  Volume change since initial %  Volume change overall V  (Blank rows = not tested)  LANDMARK LEFT   L LEG (A-D) 6011.6 ml  L THIGH (E-G) 9602.5 ml  L FULL LIMB (A-G) 15614.1 ml  Limb Volume differential (LVD)  LEG LVD = 1.8%, L>R % THIGH LVD = 1.5%, R>L FULL LIMB LVD = 0.53%, R>L  Volume change since initial %  Volume change overall %  (Blank rows = not tested)   9th VISIT 11/24/22  LANDMARK LEFT   L LEG (A-D) 5918.6 ml  L THIGH (E-G) 9265.5 ml  L FULL LIMB (A-G) 15184.1  ml  Limb Volume differential (LVD)  LEG volume = Decreased 1.5%;  THIGH volume decreased 3.5 %, AND  L FULL LIMB VOLUME dec BY 2.8 % SINCE 11/01/22  Volume change since initial %  Volume change overall %    20 th VISIT 01/11/23  LANDMARK LEFT   L LEG (A-D) 5181.2 ml  L THIGH (E-G)  ml  L FULL LIMB (A-G)  ml  Limb Volume differential (LVD)  L LEG (A-D) volume = Decreased 12.4% since 11/24/22. 10% GOAL MET. Did not measure L thigh or full limb today since we have not been bandaging above the knee.   Volume change since initial L LEG (A-D) volume reduction since commencing CDT on 09/02/22 measures 13.8% 10% GOAL MET.   30 th VISIT 01/11/23  LANDMARK RLE  L LEG (A-D) 5691.5  ml  L THIGH (E-G)  ml  L FULL LIMB (A-G)  ml  Limb Volume since initial 11/01/22 DECREASED BY 4.3%  Volume change overall     TODAY'S TREATMENT:     MLD to RLE/RLQ as established. Simultaneous skin care to limit infection. 3. RLE multilayer , knee length gradient  compression  bandaging 4. Pt/ FAMILY EDU  PATIENT EDUCATION:  Continued Pt/ CG edu for lymphedema self care home program throughout session. Topics include outcome of comparative limb volumetrics- starting vs changing limb volumes over time; technology and gradient techniques used for short stretch, multilayer compression wrapping, simple self-MLD, therapeutic lymphatic pumping exercises, skin/nail care, LE precautions,. compression garment recommendations and specifications, donning and doffing gadgets, wear and care schedule and compression garment donning / doffing w assistive devices. Discussed progress towards all OT goals since commencing CDT. All questions answered to the Pt's satisfaction w comfort and fit. Good return. Person educated: Patient and Spouse Education method: Explanation, Demonstration, and Handouts Education comprehension: verbalized understanding, returned demonstration, verbal cues required, and needs further education   HOME EXERCISE PROGRAM: Lymphatic Pumping Therex-  1 set of 10, 2 x daily, in order, seated  Daily skin inspection and care with low ph lotion matching skin ph to limit infection risk Simple Self-MLD 1 x daily During Intensive Phase CDT: multilayer compression wraps from foot to groin using gradient techniques, one limb at a time  to ensure safety During Self -Management Phase CDT: Fit with custom daytime compression garments: Elvarex, custom, flat knit, ccl 2 ( 23-32 mmHg) knee highs. Consider pairing with Compreshorts Capri length pantyhose for containment of buttocks, thighs and abdomen.  Fit with BLE knee length Jobst RELAX  w/ open toe and zipper to limit ongoing tissue fibrosis formation and facilitate increased lymphatic function during HOS  Custom-made gradient compression garments and HOS devices are medically necessary in this case because they are uniquely sized and shaped to fit the exact dimensions of the affected extremities, and to provide  accurate and consistent gradient compression essential for optimally managing this patient's symptoms of chronic, progressive lymphedema. Multiple custom compression garments are needed for optimal hygiene. Custom compression garments should be replaced q 3-6 months When worn consistently for optimal effectiveness.  ASSESSMENT: Pt tolerated all aspects of MLD and fibrosis techniques today without pain. Performed simultaneous skin care with low ph castor oil throughout MLD to decrease infection risk. Reapplied RLE knee length wraps as established. Fit R custom stocking ASAP.  Cont as per POC.  OBJECTIVE IMPAIRMENTS: Abnormal gait, decreased activity tolerance, decreased balance, decreased knowledge of condition, decreased knowledge of use of DME, decreased mobility, difficulty walking, decreased ROM, increased edema, impaired sensation, obesity, pain, and chronic , progressive swelling and pain of buttocks, hips, abdomen and legs .   ACTIVITY LIMITATIONS: carrying, lifting, bending, sitting, standing, squatting, sleeping, stairs, transfers, bed mobility, bathing, dressing, hygiene/grooming, caring for others, and leisure pursuits, social participation, productive/ work activities  PARTICIPATION LIMITATIONS: meal prep, cleaning, laundry, interpersonal relationship, driving, shopping, community activity, occupation, yard work, and church  PERSONAL FACTORS: Age, Past/current experiences, hx of panic attacks and claustrophobia, Time since onset of injury/illness/exacerbation,, motivation, supportive spouse and family are also affecting patient's functional outcome.   REHAB POTENTIAL: Fair Unfortunately the Lipedema does not respond to CDT and is unchanged by this protocol. Fortunately lymphedema  does respond to differing degrees based on the quality and amount of fatty fibrosis present obstructing lymphatics, and patient's tolerance if skin is hypersensitive.   EVALUATION COMPLEXITY:  Moderate   GOALS: Goals reviewed with patient? Yes  SHORT TERM GOALS: Target date: 4th OT Rx visit   Pt will demonstrate understanding of lymphedema precautions and prevention strategies with modified independence using a printed reference to identify at least 5 precautions and discussing how s/he may implement them into daily life to reduce risk of progression with modified assistance ( printed reference). Baseline: Max A Goal status: 12/12/22 PROGRESSING 01/11/23 GOAL MET  2.  Pt will be able to apply multilayer, thigh length, compression wraps using gradient techniques with Max caregiver assistance to decrease limb volume, to limit infection risk, and to limit lymphedema progression.  Baseline: Dependent Goal status: 11/10/22 Goal Met  LONG TERM GOALS: Target date: 04/24/23  Given this patient's Intake score of 36.76 % on the Lymphedema Life Impact Scale (LLIS), patient will experience a reduction of at least 5 points in her perceived level of functional impairment resulting from lymphedema to improve functional performance and quality of life (QOL). Baseline: 36.76% Goal status: 02/20/23 PROGRESSING  2.  Given this patient's Intake score of 56/100% on the functional outcomes FOTO tool, patient will experience an increase in function of 3 points to improve basic and instrumental ADLs performance, including lymphedema self-care.  Baseline: 56% Goal status: 02/20/23 PROGRESSING  3.  During Intensive phase CDT Pt will achieve at least 85% compliance with all lymphedema self-care home program components, including  daily skin care, multilayer , gradient compression wraps with daily changes, daily simple self MLD and daily lymphatic pumping therex to achieve optimal clinical outcome and to habituate self care regime for optimal LE self-management over time. Baseline: Dependent Goal status: 01/09/23 GOAL MET  4.  Pt will  achieve at least a 10% volume reductions bilaterally below the knees to return  limb to more typical size and shape, to limit infection risk and LE progression, to decrease pain, to improve function, and to improve body image and QOL. Baseline: Dependent Goal status: 12/12/22 PROGRESSING. 01/11/23 GOAL MET for L LEG  5.  Pt will be able to don and doff appropriate compression garments and devices using assistive devices and extra time within 1 week of issue date for optimal lymphedema self-care. Baseline: Dependent Goal status: 01/25/23 GOAL MET   PLAN:  PT FREQUENCY: 2x/week  PT DURATION: 12  weeks other: and PRN  PLANNED INTERVENTIONS: Therapeutic exercises, Therapeutic activity, Patient/Family education, Self Care, DME instructions, Manual lymph drainage, Compression bandaging, Manual therapy, and skin care during MLD, fit with appropriate custom compression garments and devices, fit with appropriate compression device  which  follows lymphatic pathways and anatomic distribution  PLAN FOR NEXT SESSION:  MLD Compression Skin care Pt/ family edu for LE self care   Loel Dubonnet, MS, OTR/L, CLT-LANA 03/21/23 4:05 PM

## 2023-03-23 ENCOUNTER — Encounter: Payer: 59 | Admitting: Occupational Therapy

## 2023-03-28 ENCOUNTER — Ambulatory Visit: Payer: 59 | Admitting: Occupational Therapy

## 2023-03-28 DIAGNOSIS — I890 Lymphedema, not elsewhere classified: Secondary | ICD-10-CM

## 2023-03-28 NOTE — Therapy (Signed)
OUTPATIENT OCCUPATIONAL THERAPY TREATMENT NOTE LOWER EXTREMITY LYMPHEDEMA  Patient Name: Mary [name] MRN: [id number] DOB:1961-04-24, 62 y.o., female Today's Date: 03/28/2023   END OF SESSION:  OT End of Session - 03/28/23 1511     Visit Number 39    Number of Visits 72    Date for OT Re-Evaluation 04/24/23    OT Start Time 0300    Activity Tolerance Patient tolerated treatment well;No increased pain                 Past Medical History:  Diagnosis Date   Anemia    low iron at times   Arthritis    Colon polyps    Complication of anesthesia 1991   Pt reports she awoke during plantar fascia surgery and felt "everything"   Heart murmur    " small per patient"    Hyperlipidemia    Hypertension    Hypothyroidism    Lactose intolerance    PAC (premature atrial contraction)    Palpitations    Panic attacks    Pt reports restrictive clothing/areas and dry mouth cause her to have attack.   Peripheral vascular disease (HCC)    "small" clot after thermal ablasion   Pneumonia 2016   "walking pneumonia" 3 times in 2016   Shortness of breath    going upstairs (due to weight). not so much since weight loss   Sleep apnea    after weight loss does not have to use cpap   Vitamin B12 deficiency    Vitamin D deficiency    Past Surgical History:  Procedure Laterality Date   APPENDECTOMY  1985   CESAREAN SECTION  1987   twins   COLONOSCOPY     COLONOSCOPY WITH PROPOFOL N/A 02/27/2018   Procedure: COLONOSCOPY WITH PROPOFOL;  Surgeon: Toledo, Boykin Nearing, MD;  Location: [facility];  Service: Gastroenterology;  Laterality: N/A;   IRRIGATION AND DEBRIDEMENT HEMATOMA Right 11/12/2015   Procedure: IRRIGATION AND DEBRIDEMENT HEMATOMA;  Surgeon: Earline Mayotte, MD;  Location: [facility];  Service: General;  Laterality: Right;   KNEE ARTHROSCOPY WITH MEDIAL MENISECTOMY Left 07/15/2020   Procedure: Left knee arthroscopy with partial medial or lateral menisectomy, possible  chondroplasty, possible partial synovectomy;  Surgeon: Lyndle Herrlich, MD;  Location: [facility];  Service: Orthopedics;  Laterality: Left;   LAMINECTOMY  07/10/2013   L4   L5   DR Yetta Barre   LAPAROSCOPIC SALPINGO OOPHERECTOMY Right 04/17/2014   Procedure: LAPAROSCOPIC SALPINGO OOPHORECTOMY RIGHT;  Surgeon: Adolphus Birchwood, MD;  Location: [facility];  Service: Gynecology;  Laterality: Right;   LYSIS OF ADHESION  04/17/2014   Procedure: LYSIS OF ADHESION;  Surgeon: Adolphus Birchwood, MD;  Location: [facility];  Service: Gynecology;;   MAXIMUM ACCESS (MAS)POSTERIOR LUMBAR INTERBODY FUSION (PLIF) 1 LEVEL  07/10/2013   Procedure: FOR MAXIMUM ACCESS (MAS) POSTERIOR LUMBAR INTERBODY FUSION (PLIF) LUMBAR FOUR-FIVE;  Surgeon: Tia Alert, MD;  Location: [facility];  Service: Neurosurgery;;  FOR MAXIMUM ACCESS (MAS) POSTERIOR LUMBAR INTERBODY FUSION (PLIF) LUMBAR FOUR-FIVE   OOPHORECTOMY     LSO   OPEN REDUCTION INTERNAL FIXATION (ORIF) DISTAL RADIAL FRACTURE Left 09/20/2018   Procedure: OPEN REDUCTION INTERNAL FIXATION (ORIF) DISTAL RADIAL FRACTURE, LEFT;  Surgeon: Kennedy Bucker, MD;  Location: [facility];  Service: Orthopedics;  Laterality: Left;   PLANTAR FASCIA SURGERY  1999   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   VAGINAL HYSTERECTOMY  2001   for bleeding and LSO for cyst   Patient Active  Problem List   Diagnosis Date Noted   Reactive hypoglycemia 02/16/2022   Prediabetes 02/16/2022   Chronic venous insufficiency 12/23/2021   Lymphedema 12/13/2021   Panic attacks    Strain of knee 12/24/2019   Vitamin B12 deficiency 10/08/2019   Arthritis 07/16/2019   Edema 07/16/2019   History of abdominal pain 07/16/2019   Low back pain 07/16/2019   Vitamin D deficiency 07/16/2019   Hypothyroidism 02/28/2019   History of gastric bypass 02/28/2019   Obstructive sleep apnea syndrome 02/28/2019   Insomnia 02/28/2019   Class 3 severe obesity due to excess calories with serious comorbidity and body mass index (BMI) of 40.0 to 44.9  in adult (HCC) 02/28/2019   Elevated BP without diagnosis of hypertension 02/28/2019   Mixed hyperlipidemia 02/28/2019   Hot flashes 02/28/2019   BMI 40.0-44.9, adult (HCC) 12/16/2015   Hematoma of lower extremity 11/12/2015   Essential hypertension 06/20/2014   Pure hypercholesterolemia 06/20/2014   S/P lumbar spinal fusion 07/10/2013   Arthrodesis status 07/10/2013   Palpitations 01/02/2012   Edema of both legs 01/02/2012    PCP: Joni Reining, PA-C  REFERRING PROVIDER: Foster Simpson, DO  REFERRING DIAG: I89.0  THERAPY DIAG:  Lymphedema, not elsewhere classified  Rationale for Evaluation and Treatment: Rehabilitation  ONSET DATE: Insidious onset with progression over time. Greater than 20 years  SUBJECTIVE:                                                                                                                                                                                          SUBJECTIVE STATEMENT:[name] presents to OT visit to address BLE lipo-lymphedema. Pt is accompanied by her supportive spouse, Mary [name]. Pt denies LE-related pain in her legs.   PERTINENT HISTORY and contributing factors include, OA, HTN, Panic attacks 2/2 restrictive clothing, OSA (not using CPAP), s/p hysterectomy 1987, L knee sx, oophorectomy, s/p hysterectomy 2001, Gastric Bypass 2007, s/p plantar fascia sx 1999, obesity, insomnia  PAIN:  Are you having LE-related pain? No: NPRS scale: 0/10.  Pain location: BLE- generalized, knees Pain description: discomfort, heavy, tired, full, sore Aggravating factors: standing, walking, extended dependent sitting Relieving factors: elevation  PRECAUTIONS: Other: LYMPHEDEMA PRECAUTIONS: HYPOTHYROID, DIABETES SKIN PRECAUTIONS  WEIGHT BEARING RESTRICTIONS: No  FALLS:  Has patient fallen since last visit? 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  BLE COMPARATIVE LIMB VOLUMETRICS:  INTAKE: 11/01/22  LANDMARK RIGHT    R LEG (A-D) 5947.2 ml  R THIGH (E-G) 9750.0 ml  R FULL LIMB (A-G) 15697.2 ml  Limb Volume differential (LVD)  %  Volume change since initial %  Volume change overall V  (Blank rows = not tested)  LANDMARK LEFT   L LEG (A-D) 6011.6 ml  L THIGH (E-G) 9602.5 ml  L FULL LIMB (A-G) 15614.1 ml  Limb Volume differential (LVD)  LEG LVD = 1.8%, L>R % THIGH LVD = 1.5%, R>L FULL LIMB LVD = 0.53%, R>L  Volume change since initial %  Volume change overall %  (Blank rows = not tested)   9th VISIT 11/24/22  LANDMARK LEFT   L LEG (A-D) 5918.6 ml  L THIGH (E-G) 9265.5 ml  L FULL LIMB (A-G) 15184.1  ml  Limb Volume differential (LVD)  LEG volume = Decreased 1.5%;  THIGH volume decreased 3.5 %, AND  L FULL LIMB VOLUME dec BY 2.8 % SINCE 11/01/22  Volume change since initial %  Volume change overall %    20 th VISIT 01/11/23  LANDMARK LEFT   L LEG (A-D) 5181.2 ml  L THIGH (E-G)  ml  L FULL LIMB (A-G)  ml  Limb Volume differential (LVD)  L LEG (A-D) volume = Decreased 12.4% since 11/24/22. 10% GOAL MET. Did not measure L thigh or full limb today since we have not been bandaging above the knee.   Volume change since initial L LEG (A-D) volume reduction since commencing CDT on 09/02/22 measures 13.8% 10% GOAL MET.   30 th VISIT 01/11/23  LANDMARK RLE  L LEG (A-D) 5691.5  ml  L THIGH (E-G)  ml  L FULL LIMB (A-G)  ml  Limb Volume since initial 11/01/22 DECREASED BY 4.3%  Volume change overall      39 th VISIT 03/28/23  LANDMARK RLE  L LEG (A-D) 5632.0 ml  L THIGH (E-G)  ml  L FULL LIMB (A-G)  ml  Limb Volume since initial 11/01/22 DECREASED BY 1.05 % since last measured on 02/20/23; and  DECREASED by 5.4# since  09/02/22  Volume change overall     TODAY'S TREATMENT:     MLD to RLE/RLQ as established. Simultaneous skin care to limit infection. 2. RLE multilayer , knee length gradient compression  bandaging 3. Pt/ FAMILY EDU 4. RLE comparative limb volumetrics   PATIENT EDUCATION:  Continued Pt/ CG edu for lymphedema self care home program throughout session. Topics include outcome of comparative limb volumetrics- starting vs changing limb volumes over time; technology and gradient techniques used for short stretch, multilayer compression wrapping, simple self-MLD, therapeutic lymphatic pumping exercises, skin/nail care, LE precautions,. compression garment recommendations and specifications, donning and doffing gadgets, wear and care schedule and compression garment donning / doffing w assistive devices. Discussed progress towards all OT goals since commencing CDT. All questions answered to the Pt's satisfaction w comfort and fit. Good return. Person educated: Patient and Spouse Education method: Explanation, Demonstration, and Handouts Education comprehension: verbalized understanding, returned demonstration, verbal cues required, and needs further education   HOME EXERCISE PROGRAM: Lymphatic Pumping Therex-  1 set of 10, 2 x daily, in order, seated  Daily skin inspection and care with low ph lotion matching skin ph to limit infection risk Simple Self-MLD 1 x daily During Intensive Phase CDT: multilayer compression wraps from foot to groin using gradient techniques, one limb at a  time  to ensure safety During Self -Management Phase CDT: Fit with custom daytime compression garments: Elvarex, custom, flat knit, ccl 2 ( 23-32 mmHg) knee highs. Consider pairing with Compreshorts Capri length pantyhose for containment of buttocks, thighs and abdomen.  Fit with BLE knee length Jobst RELAX w/ open toe and zipper to limit ongoing tissue fibrosis formation and facilitate increased  lymphatic function during HOS  Custom-made gradient compression garments and HOS devices are medically necessary in this case because they are uniquely sized and shaped to fit the exact dimensions of the affected extremities, and to provide accurate and consistent gradient compression essential for optimally managing this patient's symptoms of chronic, progressive lymphedema. Multiple custom compression garments are needed for optimal hygiene. Custom compression garments should be replaced q 3-6 months When worn consistently for optimal effectiveness.  ASSESSMENT: Volumetrics reveal that the R leg is DECREASED in volume BY 1.05 % since last measured on 02/20/23; and DECREASED by 5.4% since  09/02/22. Pt tolerated all aspects of MLD and fibrosis techniques today without pain. Performed simultaneous skin care with low ph castor oil throughout MLD to decrease infection risk. Reapplied RLE knee length wraps as established. Fit R custom stocking ASAP.  Cont as per POC.  OBJECTIVE IMPAIRMENTS: Abnormal gait, decreased activity tolerance, decreased balance, decreased knowledge of condition, decreased knowledge of use of DME, decreased mobility, difficulty walking, decreased ROM, increased edema, impaired sensation, obesity, pain, and chronic , progressive swelling and pain of buttocks, hips, abdomen and legs .   ACTIVITY LIMITATIONS: carrying, lifting, bending, sitting, standing, squatting, sleeping, stairs, transfers, bed mobility, bathing, dressing, hygiene/grooming, caring for others, and leisure pursuits, social participation, productive/ work activities  PARTICIPATION LIMITATIONS: meal prep, cleaning, laundry, interpersonal relationship, driving, shopping, community activity, occupation, yard work, and church  PERSONAL FACTORS: Age, Past/current experiences, hx of panic attacks and claustrophobia, Time since onset of injury/illness/exacerbation,, motivation, supportive spouse and family are also affecting  patient's functional outcome.   REHAB POTENTIAL: Fair Unfortunately the Lipedema does not respond to CDT and is unchanged by this protocol. Fortunately lymphedema  does respond to differing degrees based on the quality and amount of fatty fibrosis present obstructing lymphatics, and patient's tolerance if skin is hypersensitive.   EVALUATION COMPLEXITY: Moderate   GOALS: Goals reviewed with patient? Yes  SHORT TERM GOALS: Target date: 4th OT Rx visit   Pt will demonstrate understanding of lymphedema precautions and prevention strategies with modified independence using a printed reference to identify at least 5 precautions and discussing how s/he may implement them into daily life to reduce risk of progression with modified assistance ( printed reference). Baseline: Max A Goal status: 12/12/22 PROGRESSING 01/11/23 GOAL MET  2.  Pt will be able to apply multilayer, thigh length, compression wraps using gradient techniques with Max caregiver assistance to decrease limb volume, to limit infection risk, and to limit lymphedema progression.  Baseline: Dependent Goal status: 11/10/22 Goal Met  LONG TERM GOALS: Target date: 04/24/23  Given this patient's Intake score of 36.76 % on the Lymphedema Life Impact Scale (LLIS), patient will experience a reduction of at least 5 points in her perceived level of functional impairment resulting from lymphedema to improve functional performance and quality of life (QOL). Baseline: 36.76% Goal status: 02/20/23 PROGRESSING  2.  Given this patient's Intake score of 56/100% on the functional outcomes FOTO tool, patient will experience an increase in function of 3 points to improve basic and instrumental ADLs performance, including lymphedema self-care.  Baseline: 56% Goal status:  02/20/23 PROGRESSING  3.  During Intensive phase CDT Pt will achieve at least 85% compliance with all lymphedema self-care home program components, including  daily skin care, multilayer ,  gradient compression wraps with daily changes, daily simple self MLD and daily lymphatic pumping therex to achieve optimal clinical outcome and to habituate self care regime for optimal LE self-management over time. Baseline: Dependent Goal status: 01/09/23 GOAL MET  4.  Pt will achieve at least a 10% volume reductions bilaterally below the knees to return limb to more typical size and shape, to limit infection risk and LE progression, to decrease pain, to improve function, and to improve body image and QOL. Baseline: Dependent Goal status: 12/12/22 PROGRESSING. 01/11/23 GOAL MET for L LEG  03/28/23 Volumetrics reveal that the R leg is DECREASED in volume BY 1.05 % since last measured on 02/20/23; and DECREASED by 5.4% since  09/02/22.   5.  Pt will be able to don and doff appropriate compression garments and devices using assistive devices and extra time within 1 week of issue date for optimal lymphedema self-care. Baseline: Dependent Goal status: 01/25/23 GOAL MET   PLAN:  PT FREQUENCY: 2x/week  PT DURATION: 12  weeks other: and PRN  PLANNED INTERVENTIONS: Therapeutic exercises, Therapeutic activity, Patient/Family education, Self Care, DME instructions, Manual lymph drainage, Compression bandaging, Manual therapy, and skin care during MLD, fit with appropriate custom compression garments and devices, fit with appropriate compression device  which  follows lymphatic pathways and anatomic distribution  PLAN FOR NEXT SESSION:  MLD Compression Skin care Pt/ family edu for LE self care   Loel Dubonnet, MS, OTR/L, CLT-LANA 03/28/23 4:07 PM

## 2023-03-30 ENCOUNTER — Telehealth: Payer: Self-pay

## 2023-03-30 ENCOUNTER — Ambulatory Visit: Payer: 59 | Admitting: Occupational Therapy

## 2023-03-30 DIAGNOSIS — I890 Lymphedema, not elsewhere classified: Secondary | ICD-10-CM | POA: Diagnosis not present

## 2023-03-30 NOTE — Telephone Encounter (Signed)
Called to ask if provider would order a sleep study as discussed in the past.

## 2023-03-30 NOTE — Telephone Encounter (Signed)
Pt voiced anxiety over her recent and continued health issues.  Discussed purchase of home BP machine for monitoring as well as awaiting cardiology visit soon.  EACP information given for her use if beneficial to talk to a mental health provider about the ongoing anxiety.

## 2023-03-30 NOTE — Therapy (Signed)
OUTPATIENT OCCUPATIONAL THERAPY TREATMENT NOTE AND PROGRESS REPORT LOWER EXTREMITY LYMPHEDEMA  Patient Name: MACKENZY TWADDLE MRN: [id number] DOB:June 08, 1961, 62 y.o., female Today's Date: 03/30/2023  Reporting period: 02/22/23 - 03/30/23  END OF SESSION:  OT End of Session - 03/30/23 1515     Visit Number 40    Number of Visits 72    Date for OT Re-Evaluation 04/24/23    OT Start Time 0310    OT Stop Time 0410    OT Time Calculation (min) 60 min    Activity Tolerance Patient tolerated treatment well;No increased pain                 Past Medical History:  Diagnosis Date   Anemia    low iron at times   Arthritis    Colon polyps    Complication of anesthesia 1991   Pt reports she awoke during plantar fascia surgery and felt "everything"   Heart murmur    " small per patient"    Hyperlipidemia    Hypertension    Hypothyroidism    Lactose intolerance    PAC (premature atrial contraction)    Palpitations    Panic attacks    Pt reports restrictive clothing/areas and dry mouth cause her to have attack.   Peripheral vascular disease (HCC)    "small" clot after thermal ablasion   Pneumonia 2016   "walking pneumonia" 3 times in 2016   Shortness of breath    going upstairs (due to weight). not so much since weight loss   Sleep apnea    after weight loss does not have to use cpap   Vitamin B12 deficiency    Vitamin D deficiency    Past Surgical History:  Procedure Laterality Date   APPENDECTOMY  1985   CESAREAN SECTION  1987   twins   COLONOSCOPY     COLONOSCOPY WITH PROPOFOL N/A 02/27/2018   Procedure: COLONOSCOPY WITH PROPOFOL;  Surgeon: Toledo, Boykin Nearing, MD;  Location: [facility];  Service: Gastroenterology;  Laterality: N/A;   IRRIGATION AND DEBRIDEMENT HEMATOMA Right 11/12/2015   Procedure: IRRIGATION AND DEBRIDEMENT HEMATOMA;  Surgeon: Earline Mayotte, MD;  Location: [facility];  Service: General;  Laterality: Right;   KNEE ARTHROSCOPY WITH MEDIAL  MENISECTOMY Left 07/15/2020   Procedure: Left knee arthroscopy with partial medial or lateral menisectomy, possible chondroplasty, possible partial synovectomy;  Surgeon: Lyndle Herrlich, MD;  Location: [facility];  Service: Orthopedics;  Laterality: Left;   LAMINECTOMY  07/10/2013   L4   L5   DR Yetta Barre   LAPAROSCOPIC SALPINGO OOPHERECTOMY Right 04/17/2014   Procedure: LAPAROSCOPIC SALPINGO OOPHORECTOMY RIGHT;  Surgeon: Adolphus Birchwood, MD;  Location: [facility];  Service: Gynecology;  Laterality: Right;   LYSIS OF ADHESION  04/17/2014   Procedure: LYSIS OF ADHESION;  Surgeon: Adolphus Birchwood, MD;  Location: [facility];  Service: Gynecology;;   MAXIMUM ACCESS (MAS)POSTERIOR LUMBAR INTERBODY FUSION (PLIF) 1 LEVEL  07/10/2013   Procedure: FOR MAXIMUM ACCESS (MAS) POSTERIOR LUMBAR INTERBODY FUSION (PLIF) LUMBAR FOUR-FIVE;  Surgeon: Tia Alert, MD;  Location: [facility];  Service: Neurosurgery;;  FOR MAXIMUM ACCESS (MAS) POSTERIOR LUMBAR INTERBODY FUSION (PLIF) LUMBAR FOUR-FIVE   OOPHORECTOMY     LSO   OPEN REDUCTION INTERNAL FIXATION (ORIF) DISTAL RADIAL FRACTURE Left 09/20/2018   Procedure: OPEN REDUCTION INTERNAL FIXATION (ORIF) DISTAL RADIAL FRACTURE, LEFT;  Surgeon: Kennedy Bucker, MD;  Location: [facility];  Service: Orthopedics;  Laterality: Left;   PLANTAR FASCIA SURGERY  1999  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   VAGINAL HYSTERECTOMY  2001   for bleeding and LSO for cyst   Patient Active Problem List   Diagnosis Date Noted   Reactive hypoglycemia 02/16/2022   Prediabetes 02/16/2022   Chronic venous insufficiency 12/23/2021   Lymphedema 12/13/2021   Panic attacks    Strain of knee 12/24/2019   Vitamin B12 deficiency 10/08/2019   Arthritis 07/16/2019   Edema 07/16/2019   History of abdominal pain 07/16/2019   Low back pain 07/16/2019   Vitamin D deficiency 07/16/2019   Hypothyroidism 02/28/2019   History of gastric bypass 02/28/2019   Obstructive sleep apnea syndrome 02/28/2019   Insomnia 02/28/2019    Class 3 severe obesity due to excess calories with serious comorbidity and body mass index (BMI) of 40.0 to 44.9 in adult (HCC) 02/28/2019   Elevated BP without diagnosis of hypertension 02/28/2019   Mixed hyperlipidemia 02/28/2019   Hot flashes 02/28/2019   BMI 40.0-44.9, adult (HCC) 12/16/2015   Hematoma of lower extremity 11/12/2015   Essential hypertension 06/20/2014   Pure hypercholesterolemia 06/20/2014   S/P lumbar spinal fusion 07/10/2013   Arthrodesis status 07/10/2013   Palpitations 01/02/2012   Edema of both legs 01/02/2012    PCP: Joni Reining, PA-C  REFERRING PROVIDER: Foster Simpson, DO  REFERRING DIAG: I89.0  THERAPY DIAG:  Lymphedema, not elsewhere classified  Rationale for Evaluation and Treatment: Rehabilitation  ONSET DATE: Insidious onset with progression over time. Greater than 20 years  SUBJECTIVE:                                                                                                                                                                                          SUBJECTIVE STATEMENT:Dreonna B Pollinger presents to OT visit to address BLE lipo-lymphedema. Pt is accompanied by her supportive spouse, [name]. Pt denies LE-related pain in her legs. Pt asks   for ETA on turn around time for custom R compression garments.  PERTINENT HISTORY and contributing factors include, OA, HTN, Panic attacks 2/2 restrictive clothing, OSA (not using CPAP), s/p hysterectomy 1987, L knee sx, oophorectomy, s/p hysterectomy 2001, Gastric Bypass 2007, s/p plantar fascia sx 1999, obesity, insomnia  PAIN:  Are you having LE-related pain? No: NPRS scale: 0/10.  Pain location: BLE- generalized, knees Pain description: discomfort, heavy, tired, full, sore Aggravating factors: standing, walking, extended dependent sitting Relieving factors: elevation  PRECAUTIONS: Other: LYMPHEDEMA PRECAUTIONS: HYPOTHYROID, DIABETES SKIN PRECAUTIONS  WEIGHT BEARING RESTRICTIONS:  No  FALLS:  Has patient fallen since last visit? 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  BLE COMPARATIVE LIMB VOLUMETRICS:  INTAKE: 11/01/22  LANDMARK RIGHT    R LEG (A-D) 5947.2 ml  R THIGH (E-G) 9750.0 ml  R FULL LIMB (A-G) 15697.2 ml  Limb Volume differential (LVD)  %  Volume change since initial %  Volume change overall V  (Blank rows = not tested)  LANDMARK LEFT   L LEG (A-D) 6011.6 ml  L THIGH (E-G) 9602.5 ml  L FULL LIMB (A-G) 15614.1 ml  Limb Volume differential (LVD)  LEG LVD = 1.8%, L>R % THIGH LVD = 1.5%, R>L FULL LIMB LVD = 0.53%, R>L  Volume change since initial %  Volume change overall %  (Blank rows = not tested)   9th VISIT 11/24/22  LANDMARK LEFT   L LEG (A-D) 5918.6 ml  L THIGH (E-G) 9265.5 ml  L FULL LIMB (A-G) 15184.1  ml  Limb Volume differential (LVD)  LEG volume = Decreased 1.5%;  THIGH volume decreased 3.5 %, AND  L FULL LIMB VOLUME dec BY 2.8 % SINCE 11/01/22  Volume change since initial %  Volume change overall %    20 th VISIT 01/11/23  LANDMARK LEFT   L LEG (A-D) 5181.2 ml  L THIGH (E-G)  ml  L FULL LIMB (A-G)  ml  Limb Volume differential (LVD)  L LEG (A-D) volume = Decreased 12.4% since 11/24/22. 10% GOAL MET. Did not measure L thigh or full limb today since we have not been bandaging above the knee.   Volume change since initial L LEG (A-D) volume reduction since commencing CDT on 09/02/22 measures 13.8% 10% GOAL MET.   30 th VISIT 01/11/23  LANDMARK RLE  L LEG (A-D) 5691.5  ml  L THIGH (E-G)  ml  L FULL LIMB (A-G)  ml  Limb Volume since initial 11/01/22 DECREASED BY 4.3%  Volume change overall      39 th  VISIT 03/28/23  LANDMARK RLE  L LEG (A-D) 5632.0 ml  L THIGH (E-G)  ml  L FULL LIMB (A-G)  ml  Limb Volume since initial 11/01/22 DECREASED BY 1.05 % since last measured on 02/20/23; and DECREASED by 5.4%  since  09/02/22  Volume change overall     TODAY'S TREATMENT:     MLD to RLE/RLQ as established. Simultaneous skin care to limit infection. 2. RLE multilayer , knee length gradient compression  bandaging 3. Pt/ FAMILY EDU   PATIENT EDUCATION:  Continued Pt/ CG edu for lymphedema self care home program throughout session. Topics include outcome of comparative limb volumetrics- starting vs changing limb volumes over time; technology and gradient techniques used for short stretch, multilayer compression wrapping, simple self-MLD, therapeutic lymphatic pumping exercises, skin/nail care, LE precautions,. compression garment recommendations and specifications, donning and doffing gadgets, wear and care schedule and compression garment donning / doffing w assistive devices. Discussed progress towards all OT goals since commencing CDT. All questions answered to the Pt's satisfaction w comfort and fit. Good return. Person educated: Patient and Spouse Education method: Explanation, Demonstration, and Handouts Education comprehension: verbalized understanding, returned demonstration, verbal cues required, and needs further education   HOME EXERCISE PROGRAM: Lymphatic Pumping Therex-  1 set of 10, 2 x daily, in order, seated  Daily skin inspection and care with  low ph lotion matching skin ph to limit infection risk Simple Self-MLD 1 x daily During Intensive Phase CDT: multilayer compression wraps from foot to groin using gradient techniques, one limb at a time  to ensure safety During Self -Management Phase CDT: Fit with custom daytime compression garments: Elvarex, custom, flat knit, ccl 2 ( 23-32 mmHg) knee highs. Consider pairing with Compreshorts Capri length pantyhose for containment of buttocks,  thighs and abdomen.  Fit with BLE knee length Jobst RELAX w/ open toe and zipper to limit ongoing tissue fibrosis formation and facilitate increased lymphatic function during HOS  Custom-made gradient compression garments and HOS devices are medically necessary in this case because they are uniquely sized and shaped to fit the exact dimensions of the affected extremities, and to provide accurate and consistent gradient compression essential for optimally managing this patient's symptoms of chronic, progressive lymphedema. Multiple custom compression garments are needed for optimal hygiene. Custom compression garments should be replaced q 3-6 months When worn consistently for optimal effectiveness.  ASSESSMENT: Pt continues to make slow but steady progress towards all OT goals .Please see LONG TERM GOALS section for details. Volumetrics reveal that the R leg is DECREASED in volume BY 1.05 % since last measured on 02/20/23; and DECREASED by 5.4% since  09/02/22. Pt tolerated all aspects of MLD and fibrosis techniques today without pain. Performed simultaneous skin care with low ph castor oil throughout MLD to decrease infection risk. Reapplied RLE knee length wraps as established. Fit R custom stocking ASAP.  Cont as per POC.  OBJECTIVE IMPAIRMENTS: Abnormal gait, decreased activity tolerance, decreased balance, decreased knowledge of condition, decreased knowledge of use of DME, decreased mobility, difficulty walking, decreased ROM, increased edema, impaired sensation, obesity, pain, and chronic , progressive swelling and pain of buttocks, hips, abdomen and legs .   ACTIVITY LIMITATIONS: carrying, lifting, bending, sitting, standing, squatting, sleeping, stairs, transfers, bed mobility, bathing, dressing, hygiene/grooming, caring for others, and leisure pursuits, social participation, productive/ work activities  PARTICIPATION LIMITATIONS: meal prep, cleaning, laundry, interpersonal relationship, driving,  shopping, community activity, occupation, yard work, and church  PERSONAL FACTORS: Age, Past/current experiences, hx of panic attacks and claustrophobia, Time since onset of injury/illness/exacerbation,, motivation, supportive spouse and family are also affecting patient's functional outcome.   REHAB POTENTIAL: Fair Unfortunately the Lipedema does not respond to CDT and is unchanged by this protocol. Fortunately lymphedema  does respond to differing degrees based on the quality and amount of fatty fibrosis present obstructing lymphatics, and patient's tolerance if skin is hypersensitive.   EVALUATION COMPLEXITY: Moderate   GOALS: Goals reviewed with patient? Yes  SHORT TERM GOALS: Target date: 4th OT Rx visit   Pt will demonstrate understanding of lymphedema precautions and prevention strategies with modified independence using a printed reference to identify at least 5 precautions and discussing how s/he may implement them into daily life to reduce risk of progression with modified assistance ( printed reference). Baseline: Max A Goal status: 12/12/22 PROGRESSING 01/11/23 GOAL MET  2.  Pt will be able to apply multilayer, thigh length, compression wraps using gradient techniques with Max caregiver assistance to decrease limb volume, to limit infection risk, and to limit lymphedema progression.  Baseline: Dependent Goal status: 11/10/22 Goal Met  LONG TERM GOALS: Target date: 04/24/23  Given this patient's Intake score of 36.76 % on the Lymphedema Life Impact Scale (LLIS), patient will experience a reduction of at least 5 points in her perceived level of functional impairment resulting from lymphedema to improve functional  performance and quality of life (QOL). Baseline: 36.76% Goal status: 02/20/23 PROGRESSING  2.  Given this patient's Intake score of 56/100% on the functional outcomes FOTO tool, patient will experience an increase in function of 3 points to improve basic and instrumental ADLs  performance, including lymphedema self-care.  Baseline: 56% Goal status: 02/20/23 PROGRESSING  3.  During Intensive phase CDT Pt will achieve at least 85% compliance with all lymphedema self-care home program components, including  daily skin care, multilayer , gradient compression wraps with daily changes, daily simple self MLD and daily lymphatic pumping therex to achieve optimal clinical outcome and to habituate self care regime for optimal LE self-management over time. Baseline: Dependent Goal status: 01/09/23 GOAL MET  4.  Pt will achieve at least a 10% volume reductions bilaterally below the knees to return limb to more typical size and shape, to limit infection risk and LE progression, to decrease pain, to improve function, and to improve body image and QOL. Baseline: Dependent Goal status: 12/12/22 PROGRESSING. 01/11/23 GOAL MET for L LEG  03/28/23 Volumetrics reveal that the R leg is DECREASED in volume BY 1.05 % since last measured on 02/20/23; and DECREASED by 5.4% since  09/02/22.   5.  Pt will be able to don and doff appropriate compression garments and devices using assistive devices and extra time within 1 week of issue date for optimal lymphedema self-care. Baseline: Dependent Goal status: 01/25/23 GOAL MET   PLAN:  PT FREQUENCY: 2x/week  PT DURATION: 12  weeks other: and PRN  PLANNED INTERVENTIONS: Therapeutic exercises, Therapeutic activity, Patient/Family education, Self Care, DME instructions, Manual lymph drainage, Compression bandaging, Manual therapy, and skin care during MLD, fit with appropriate custom compression garments and devices, fit with appropriate compression device  which  follows lymphatic pathways and anatomic distribution  PLAN FOR NEXT SESSION:  MLD Compression Skin care Pt/ family edu for LE self care   Loel Dubonnet, MS, OTR/L, CLT-LANA 03/30/23 4:09 PM

## 2023-04-04 ENCOUNTER — Ambulatory Visit: Payer: 59 | Admitting: Occupational Therapy

## 2023-04-04 ENCOUNTER — Encounter: Payer: Self-pay | Admitting: Occupational Therapy

## 2023-04-04 DIAGNOSIS — I890 Lymphedema, not elsewhere classified: Secondary | ICD-10-CM | POA: Diagnosis not present

## 2023-04-05 NOTE — Therapy (Signed)
OUTPATIENT OCCUPATIONAL THERAPY TREATMENT NOTE LOWER EXTREMITY LYMPHEDEMA  Patient Name: Mary [name] MRN: [id number] DOB:09-10-1960, 62 y.o., female Today's Date: 04/05/2023    END OF SESSION:  OT End of Session - 04/04/23 1513     Visit Number 41    Number of Visits 72    Date for OT Re-Evaluation 04/24/23    OT Start Time 0300    OT Stop Time 0400    OT Time Calculation (min) 60 min    Activity Tolerance Patient tolerated treatment well;No increased pain                 Past Medical History:  Diagnosis Date   Anemia    low iron at times   Arthritis    Colon polyps    Complication of anesthesia 1991   Pt reports she awoke during plantar fascia surgery and felt "everything"   Heart murmur    " small per patient"    Hyperlipidemia    Hypertension    Hypothyroidism    Lactose intolerance    PAC (premature atrial contraction)    Palpitations    Panic attacks    Pt reports restrictive clothing/areas and dry mouth cause her to have attack.   Peripheral vascular disease (HCC)    "small" clot after thermal ablasion   Pneumonia 2016   "walking pneumonia" 3 times in 2016   Shortness of breath    going upstairs (due to weight). not so much since weight loss   Sleep apnea    after weight loss does not have to use cpap   Vitamin B12 deficiency    Vitamin D deficiency    Past Surgical History:  Procedure Laterality Date   APPENDECTOMY  1985   CESAREAN SECTION  1987   twins   COLONOSCOPY     COLONOSCOPY WITH PROPOFOL N/A 02/27/2018   Procedure: COLONOSCOPY WITH PROPOFOL;  Surgeon: Toledo, Boykin Nearing, MD;  Location: [facility];  Service: Gastroenterology;  Laterality: N/A;   IRRIGATION AND DEBRIDEMENT HEMATOMA Right 11/12/2015   Procedure: IRRIGATION AND DEBRIDEMENT HEMATOMA;  Surgeon: Earline Mayotte, MD;  Location: [facility];  Service: General;  Laterality: Right;   KNEE ARTHROSCOPY WITH MEDIAL MENISECTOMY Left 07/15/2020   Procedure: Left knee arthroscopy  with partial medial or lateral menisectomy, possible chondroplasty, possible partial synovectomy;  Surgeon: Lyndle Herrlich, MD;  Location: [facility];  Service: Orthopedics;  Laterality: Left;   LAMINECTOMY  07/10/2013   L4   L5   DR Yetta Barre   LAPAROSCOPIC SALPINGO OOPHERECTOMY Right 04/17/2014   Procedure: LAPAROSCOPIC SALPINGO OOPHORECTOMY RIGHT;  Surgeon: Adolphus Birchwood, MD;  Location: [facility];  Service: Gynecology;  Laterality: Right;   LYSIS OF ADHESION  04/17/2014   Procedure: LYSIS OF ADHESION;  Surgeon: Adolphus Birchwood, MD;  Location: [facility];  Service: Gynecology;;   MAXIMUM ACCESS (MAS)POSTERIOR LUMBAR INTERBODY FUSION (PLIF) 1 LEVEL  07/10/2013   Procedure: FOR MAXIMUM ACCESS (MAS) POSTERIOR LUMBAR INTERBODY FUSION (PLIF) LUMBAR FOUR-FIVE;  Surgeon: Tia Alert, MD;  Location: [facility];  Service: Neurosurgery;;  FOR MAXIMUM ACCESS (MAS) POSTERIOR LUMBAR INTERBODY FUSION (PLIF) LUMBAR FOUR-FIVE   OOPHORECTOMY     LSO   OPEN REDUCTION INTERNAL FIXATION (ORIF) DISTAL RADIAL FRACTURE Left 09/20/2018   Procedure: OPEN REDUCTION INTERNAL FIXATION (ORIF) DISTAL RADIAL FRACTURE, LEFT;  Surgeon: Kennedy Bucker, MD;  Location: [facility];  Service: Orthopedics;  Laterality: Left;   PLANTAR FASCIA SURGERY  1999   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  VAGINAL HYSTERECTOMY  2001   for bleeding and LSO for cyst   Patient Active Problem List   Diagnosis Date Noted   Reactive hypoglycemia 02/16/2022   Prediabetes 02/16/2022   Chronic venous insufficiency 12/23/2021   Lymphedema 12/13/2021   Panic attacks    Strain of knee 12/24/2019   Vitamin B12 deficiency 10/08/2019   Arthritis 07/16/2019   Edema 07/16/2019   History of abdominal pain 07/16/2019   Low back pain 07/16/2019   Vitamin D deficiency 07/16/2019   Hypothyroidism 02/28/2019   History of gastric bypass 02/28/2019   Obstructive sleep apnea syndrome 02/28/2019   Insomnia 02/28/2019   Class 3 severe obesity due to excess calories with serious  comorbidity and body mass index (BMI) of 40.0 to 44.9 in adult (HCC) 02/28/2019   Elevated BP without diagnosis of hypertension 02/28/2019   Mixed hyperlipidemia 02/28/2019   Hot flashes 02/28/2019   BMI 40.0-44.9, adult (HCC) 12/16/2015   Hematoma of lower extremity 11/12/2015   Essential hypertension 06/20/2014   Pure hypercholesterolemia 06/20/2014   S/P lumbar spinal fusion 07/10/2013   Arthrodesis status 07/10/2013   Palpitations 01/02/2012   Edema of both legs 01/02/2012    PCP: Mary Reining, PA-C  REFERRING PROVIDER: Foster Simpson, DO  REFERRING DIAG: I89.0  THERAPY DIAG:  Lymphedema, not elsewhere classified  Rationale for Evaluation and Treatment: Rehabilitation  ONSET DATE: Insidious onset with progression over time. Greater than 20 years  SUBJECTIVE:                                                                                                                                                                                          SUBJECTIVE STATEMENT:Mary [name] presents to OT visit to address BLE lipo-lymphedema. Pt is accompanied by her supportive spouse, Mary [name]. Pt denies LE-related pain in her legs. Pt reports she saw cardiologist who issued her with a portable monitor. She has been wearing it for a couple of days, and had a  fall in the middle of the night. She states she got up to go to the bathroom and when rounding the bottom of the bed she passed out and woke up on the floor. Pt was able to get herself up . She is sore today, but uninjured by report.   PERTINENT HISTORY and contributing factors include, OA, HTN, Panic attacks 2/2 restrictive clothing, OSA (not using CPAP), s/p hysterectomy 1987, L knee sx, oophorectomy, s/p hysterectomy 2001, Gastric Bypass 2007, s/p plantar fascia sx 1999, obesity, insomnia  PAIN:  Are you having LE-related pain? No: NPRS scale: 0/10.  Pain location: BLE- generalized, knees Pain description: discomfort, heavy, tired,  full, sore Aggravating factors: standing, walking, extended dependent sitting Relieving factors: elevation  PRECAUTIONS: Other: LYMPHEDEMA PRECAUTIONS: HYPOTHYROID, DIABETES SKIN PRECAUTIONS  WEIGHT BEARING RESTRICTIONS: No  FALLS:  Has patient fallen since last visit? 04/05/23 YES Pt had a  fall in the middle of the night. She states she got up to go to the bathroom and when rounding the bottom of the bed she passed out and woke up on the floor. Pt was able to get herself up . She is sore today, but uninjured by report. She was wearing the portable heart monitor at the time.   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  BLE COMPARATIVE LIMB VOLUMETRICS:  INTAKE: 11/01/22  LANDMARK RIGHT    R LEG (A-D) 5947.2 ml  R THIGH (E-G) 9750.0 ml  R FULL LIMB (A-G) 15697.2 ml  Limb Volume differential (LVD)  %  Volume change since initial %  Volume change overall V  (Blank rows = not tested)  LANDMARK LEFT   L LEG (A-D) 6011.6 ml  L THIGH (E-G) 9602.5 ml  L FULL LIMB (A-G) 15614.1 ml  Limb Volume differential (LVD)  LEG LVD = 1.8%, L>R % THIGH LVD = 1.5%, R>L FULL LIMB LVD = 0.53%, R>L  Volume change since initial %  Volume change overall %  (Blank rows = not tested)   9th VISIT 11/24/22  LANDMARK LEFT   L LEG (A-D) 5918.6 ml  L THIGH (E-G) 9265.5 ml  L FULL LIMB (A-G) 15184.1  ml  Limb Volume differential (LVD)  LEG volume = Decreased 1.5%;  THIGH volume decreased 3.5 %, AND  L FULL LIMB VOLUME dec BY 2.8 % SINCE 11/01/22  Volume change since initial %  Volume change overall %    20 th VISIT 01/11/23  LANDMARK LEFT   L LEG (A-D) 5181.2 ml  L THIGH (E-G)  ml  L  FULL LIMB (A-G)  ml  Limb Volume differential (LVD)  L LEG (A-D) volume = Decreased 12.4% since 11/24/22. 10% GOAL MET. Did not measure L thigh or full limb today since we have not been bandaging above the knee.   Volume change since initial L LEG (A-D) volume reduction since commencing CDT on 09/02/22 measures 13.8% 10% GOAL MET.   30 th VISIT 01/11/23  LANDMARK RLE  L LEG (A-D) 5691.5  ml  L THIGH (E-G)  ml  L FULL LIMB (A-G)  ml  Limb Volume since initial 11/01/22 DECREASED BY 4.3%  Volume change overall      39 th VISIT 03/28/23  LANDMARK RLE  L LEG (A-D) 5632.0 ml  L THIGH (E-G)  ml  L FULL LIMB (A-G)  ml  Limb Volume since initial 11/01/22 DECREASED BY 1.05 % since last measured on 02/20/23; and DECREASED by 5.4%  since  09/02/22  Volume change overall     TODAY'S TREATMENT:     MLD to RLE/RLQ as established. Simultaneous skin care to limit infection. 2. RLE multilayer , knee length gradient compression  bandaging 3. Pt/ FAMILY EDU   PATIENT EDUCATION:  Continued Pt/ CG edu for lymphedema self care home program throughout session. Topics include outcome of comparative limb volumetrics- starting vs  changing limb volumes over time; technology and gradient techniques used for short stretch, multilayer compression wrapping, simple self-MLD, therapeutic lymphatic pumping exercises, skin/nail care, LE precautions,. compression garment recommendations and specifications, donning and doffing gadgets, wear and care schedule and compression garment donning / doffing w assistive devices. Discussed progress towards all OT goals since commencing CDT. All questions answered to the Pt's satisfaction w comfort and fit. Good return. Person educated: Patient and Spouse Education method: Explanation, Demonstration, and Handouts Education comprehension: verbalized understanding, returned demonstration, verbal cues required, and needs further education   HOME EXERCISE PROGRAM: Lymphatic Pumping  Therex-  1 set of 10, 2 x daily, in order, seated  Daily skin inspection and care with low ph lotion matching skin ph to limit infection risk Simple Self-MLD 1 x daily During Intensive Phase CDT: multilayer compression wraps from foot to groin using gradient techniques, one limb at a time  to ensure safety During Self -Management Phase CDT: Fit with custom daytime compression garments: Elvarex, custom, flat knit, ccl 2 ( 23-32 mmHg) knee highs. Consider pairing with Compreshorts Capri length pantyhose for containment of buttocks, thighs and abdomen.  Fit with BLE knee length Jobst RELAX w/ open toe and zipper to limit ongoing tissue fibrosis formation and facilitate increased lymphatic function during HOS  Custom-made gradient compression garments and HOS devices are medically necessary in this case because they are uniquely sized and shaped to fit the exact dimensions of the affected extremities, and to provide accurate and consistent gradient compression essential for optimally managing this patient's symptoms of chronic, progressive lymphedema. Multiple custom compression garments are needed for optimal hygiene. Custom compression garments should be replaced q 3-6 months When worn consistently for optimal effectiveness.  ASSESSMENT: B leg swelling appears well managed. No bruising or skin injury observed on legs after recent fall. Pt tolerated MLD and gentle fibrosis techniques to ankle today without pain. She denies dizziness  and SOB w both manual therapy and with compression wraps on L LEG. Fit R custom stocking ASAP.  Cont as per POC.  OBJECTIVE IMPAIRMENTS: Abnormal gait, decreased activity tolerance, decreased balance, decreased knowledge of condition, decreased knowledge of use of DME, decreased mobility, difficulty walking, decreased ROM, increased edema, impaired sensation, obesity, pain, and chronic , progressive swelling and pain of buttocks, hips, abdomen and legs .   ACTIVITY  LIMITATIONS: carrying, lifting, bending, sitting, standing, squatting, sleeping, stairs, transfers, bed mobility, bathing, dressing, hygiene/grooming, caring for others, and leisure pursuits, social participation, productive/ work activities  PARTICIPATION LIMITATIONS: meal prep, cleaning, laundry, interpersonal relationship, driving, shopping, community activity, occupation, yard work, and church  PERSONAL FACTORS: Age, Past/current experiences, hx of panic attacks and claustrophobia, Time since onset of injury/illness/exacerbation,, motivation, supportive spouse and family are also affecting patient's functional outcome.   REHAB POTENTIAL: Fair Unfortunately the Lipedema does not respond to CDT and is unchanged by this protocol. Fortunately lymphedema  does respond to differing degrees based on the quality and amount of fatty fibrosis present obstructing lymphatics, and patient's tolerance if skin is hypersensitive.   EVALUATION COMPLEXITY: Moderate   GOALS: Goals reviewed with patient? Yes  SHORT TERM GOALS: Target date: 4th OT Rx visit   Pt will demonstrate understanding of lymphedema precautions and prevention strategies with modified independence using a printed reference to identify at least 5 precautions and discussing how s/he may implement them into daily life to reduce risk of progression with modified assistance ( printed reference). Baseline: Max A Goal status: 12/12/22 PROGRESSING 01/11/23 GOAL MET  2.  Pt will be able to apply multilayer, thigh length, compression wraps using gradient techniques with Max caregiver assistance to decrease limb volume, to limit infection risk, and to limit lymphedema progression.  Baseline: Dependent Goal status: 11/10/22 Goal Met  LONG TERM GOALS: Target date: 04/24/23  Given this patient's Intake score of 36.76 % on the Lymphedema Life Impact Scale (LLIS), patient will experience a reduction of at least 5 points in her perceived level of functional  impairment resulting from lymphedema to improve functional performance and quality of life (QOL). Baseline: 36.76% Goal status: 02/20/23 PROGRESSING  2.  Given this patient's Intake score of 56/100% on the functional outcomes FOTO tool, patient will experience an increase in function of 3 points to improve basic and instrumental ADLs performance, including lymphedema self-care.  Baseline: 56% Goal status: 02/20/23 PROGRESSING  3.  During Intensive phase CDT Pt will achieve at least 85% compliance with all lymphedema self-care home program components, including  daily skin care, multilayer , gradient compression wraps with daily changes, daily simple self MLD and daily lymphatic pumping therex to achieve optimal clinical outcome and to habituate self care regime for optimal LE self-management over time. Baseline: Dependent Goal status: 01/09/23 GOAL MET  4.  Pt will achieve at least a 10% volume reductions bilaterally below the knees to return limb to more typical size and shape, to limit infection risk and LE progression, to decrease pain, to improve function, and to improve body image and QOL. Baseline: Dependent Goal status: 12/12/22 PROGRESSING. 01/11/23 GOAL MET for L LEG  03/28/23 Volumetrics reveal that the R leg is DECREASED in volume BY 1.05 % since last measured on 02/20/23; and DECREASED by 5.4% since  09/02/22.   5.  Pt will be able to don and doff appropriate compression garments and devices using assistive devices and extra time within 1 week of issue date for optimal lymphedema self-care. Baseline: Dependent Goal status: 01/25/23 GOAL MET   PLAN:  PT FREQUENCY: 2x/week  PT DURATION: 12  weeks other: and PRN  PLANNED INTERVENTIONS: Therapeutic exercises, Therapeutic activity, Patient/Family education, Self Care, DME instructions, Manual lymph drainage, Compression bandaging, Manual therapy, and skin care during MLD, fit with appropriate custom compression garments and devices, fit with  appropriate compression device  which  follows lymphatic pathways and anatomic distribution  PLAN FOR NEXT SESSION:  MLD Compression Skin care Pt/ family edu for LE self care   Loel Dubonnet, MS, OTR/L, CLT-LANA 04/05/23 10:32 AM

## 2023-04-06 ENCOUNTER — Ambulatory Visit: Payer: 59 | Admitting: Occupational Therapy

## 2023-04-06 ENCOUNTER — Encounter: Payer: Self-pay | Admitting: Occupational Therapy

## 2023-04-06 DIAGNOSIS — I890 Lymphedema, not elsewhere classified: Secondary | ICD-10-CM

## 2023-04-06 NOTE — Therapy (Signed)
OUTPATIENT OCCUPATIONAL THERAPY TREATMENT NOTE LOWER EXTREMITY LYMPHEDEMA  Patient Name: Mary [name] MRN: [id number] DOB:1961/06/14, 62 y.o., female Today's Date: 04/06/2023    END OF SESSION:  OT End of Session - 04/06/23 1517     Visit Number 42    Number of Visits 72    Date for OT Re-Evaluation 04/24/23    OT Start Time 0305    OT Stop Time 0355    OT Time Calculation (min) 50 min    Activity Tolerance Patient tolerated treatment well;No increased pain                 Past Medical History:  Diagnosis Date   Anemia    low iron at times   Arthritis    Colon polyps    Complication of anesthesia 1991   Pt reports she awoke during plantar fascia surgery and felt "everything"   Heart murmur    " small per patient"    Hyperlipidemia    Hypertension    Hypothyroidism    Lactose intolerance    PAC (premature atrial contraction)    Palpitations    Panic attacks    Pt reports restrictive clothing/areas and dry mouth cause her to have attack.   Peripheral vascular disease (HCC)    "small" clot after thermal ablasion   Pneumonia 2016   "walking pneumonia" 3 times in 2016   Shortness of breath    going upstairs (due to weight). not so much since weight loss   Sleep apnea    after weight loss does not have to use cpap   Vitamin B12 deficiency    Vitamin D deficiency    Past Surgical History:  Procedure Laterality Date   APPENDECTOMY  1985   CESAREAN SECTION  1987   twins   COLONOSCOPY     COLONOSCOPY WITH PROPOFOL N/A 02/27/2018   Procedure: COLONOSCOPY WITH PROPOFOL;  Surgeon: Toledo, Boykin Nearing, MD;  Location: [facility];  Service: Gastroenterology;  Laterality: N/A;   IRRIGATION AND DEBRIDEMENT HEMATOMA Right 11/12/2015   Procedure: IRRIGATION AND DEBRIDEMENT HEMATOMA;  Surgeon: Earline Mayotte, MD;  Location: [facility];  Service: General;  Laterality: Right;   KNEE ARTHROSCOPY WITH MEDIAL MENISECTOMY Left 07/15/2020   Procedure: Left knee arthroscopy  with partial medial or lateral menisectomy, possible chondroplasty, possible partial synovectomy;  Surgeon: Lyndle Herrlich, MD;  Location: [facility];  Service: Orthopedics;  Laterality: Left;   LAMINECTOMY  07/10/2013   L4   L5   DR Yetta Barre   LAPAROSCOPIC SALPINGO OOPHERECTOMY Right 04/17/2014   Procedure: LAPAROSCOPIC SALPINGO OOPHORECTOMY RIGHT;  Surgeon: Adolphus Birchwood, MD;  Location: [facility];  Service: Gynecology;  Laterality: Right;   LYSIS OF ADHESION  04/17/2014   Procedure: LYSIS OF ADHESION;  Surgeon: Adolphus Birchwood, MD;  Location: [facility];  Service: Gynecology;;   MAXIMUM ACCESS (MAS)POSTERIOR LUMBAR INTERBODY FUSION (PLIF) 1 LEVEL  07/10/2013   Procedure: FOR MAXIMUM ACCESS (MAS) POSTERIOR LUMBAR INTERBODY FUSION (PLIF) LUMBAR FOUR-FIVE;  Surgeon: Tia Alert, MD;  Location: [facility];  Service: Neurosurgery;;  FOR MAXIMUM ACCESS (MAS) POSTERIOR LUMBAR INTERBODY FUSION (PLIF) LUMBAR FOUR-FIVE   OOPHORECTOMY     LSO   OPEN REDUCTION INTERNAL FIXATION (ORIF) DISTAL RADIAL FRACTURE Left 09/20/2018   Procedure: OPEN REDUCTION INTERNAL FIXATION (ORIF) DISTAL RADIAL FRACTURE, LEFT;  Surgeon: Kennedy Bucker, MD;  Location: [facility];  Service: Orthopedics;  Laterality: Left;   PLANTAR FASCIA SURGERY  1999   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  VAGINAL HYSTERECTOMY  2001   for bleeding and LSO for cyst   Patient Active Problem List   Diagnosis Date Noted   Reactive hypoglycemia 02/16/2022   Prediabetes 02/16/2022   Chronic venous insufficiency 12/23/2021   Lymphedema 12/13/2021   Panic attacks    Strain of knee 12/24/2019   Vitamin B12 deficiency 10/08/2019   Arthritis 07/16/2019   Edema 07/16/2019   History of abdominal pain 07/16/2019   Low back pain 07/16/2019   Vitamin D deficiency 07/16/2019   Hypothyroidism 02/28/2019   History of gastric bypass 02/28/2019   Obstructive sleep apnea syndrome 02/28/2019   Insomnia 02/28/2019   Class 3 severe obesity due to excess calories with serious  comorbidity and body mass index (BMI) of 40.0 to 44.9 in adult (HCC) 02/28/2019   Elevated BP without diagnosis of hypertension 02/28/2019   Mixed hyperlipidemia 02/28/2019   Hot flashes 02/28/2019   BMI 40.0-44.9, adult (HCC) 12/16/2015   Hematoma of lower extremity 11/12/2015   Essential hypertension 06/20/2014   Pure hypercholesterolemia 06/20/2014   S/P lumbar spinal fusion 07/10/2013   Arthrodesis status 07/10/2013   Palpitations 01/02/2012   Edema of both legs 01/02/2012    PCP: Joni Reining, PA-C  REFERRING PROVIDER: Foster Simpson, DO  REFERRING DIAG: I89.0  THERAPY DIAG:  Lymphedema, not elsewhere classified  Rationale for Evaluation and Treatment: Rehabilitation  ONSET DATE: Insidious onset with progression over time. Greater than 20 years  SUBJECTIVE:                                                                                                                                                                                          SUBJECTIVE STATEMENT:Mary [name] presents to OT visit to address BLE lipo-lymphedema. Pt is accompanied by her supportive spouse, [name]. Pt denies LE-related pain in her legs. Pt reports she saw cardiologist who issued her with a portable monitor. Pt denies LE-related leg pain. Her L knee is still sore after her fall.  PERTINENT HISTORY and contributing factors include, OA, HTN, Panic attacks 2/2 restrictive clothing, OSA (not using CPAP), s/p hysterectomy 1987, L knee sx, oophorectomy, s/p hysterectomy 2001, Gastric Bypass 2007, s/p plantar fascia sx 1999, obesity, insomnia  PAIN:  Are you having LE-related pain? No: NPRS scale: 0/10.  Pain location: BLE- generalized, knees Pain description: discomfort, heavy, tired, full, sore Aggravating factors: standing, walking, extended dependent sitting Relieving factors: elevation  PRECAUTIONS: Other: LYMPHEDEMA PRECAUTIONS: HYPOTHYROID, DIABETES SKIN PRECAUTIONS  WEIGHT BEARING  RESTRICTIONS: No  FALLS:  Has patient fallen since last visit? 04/05/23 YES Pt had a  fall in the middle of the night. She states she  got up to go to the bathroom and when rounding the bottom of the bed she passed out and woke up on the floor. Pt was able to get herself up . She is sore today, but uninjured by report. She was wearing the portable heart monitor at the time.   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  BLE COMPARATIVE LIMB VOLUMETRICS:  INTAKE: 11/01/22  LANDMARK RIGHT    R LEG (A-D) 5947.2 ml  R THIGH (E-G) 9750.0 ml  R FULL LIMB (A-G) 15697.2 ml  Limb Volume differential (LVD)  %  Volume change since initial %  Volume change overall V  (Blank rows = not tested)  LANDMARK LEFT   L LEG (A-D) 6011.6 ml  L THIGH (E-G) 9602.5 ml  L FULL LIMB (A-G) 15614.1 ml  Limb Volume differential (LVD)  LEG LVD = 1.8%, L>R % THIGH LVD = 1.5%, R>L FULL LIMB LVD = 0.53%, R>L  Volume change since initial %  Volume change overall %  (Blank rows = not tested)   9th VISIT 11/24/22  LANDMARK LEFT   L LEG (A-D) 5918.6 ml  L THIGH (E-G) 9265.5 ml  L FULL LIMB (A-G) 15184.1  ml  Limb Volume differential (LVD)  LEG volume = Decreased 1.5%;  THIGH volume decreased 3.5 %, AND  L FULL LIMB VOLUME dec BY 2.8 % SINCE 11/01/22  Volume change since initial %  Volume change overall %    20 th VISIT 01/11/23  LANDMARK LEFT   L LEG (A-D) 5181.2 ml  L THIGH (E-G)  ml  L FULL LIMB (A-G)  ml  Limb Volume differential (LVD)  L LEG (A-D) volume = Decreased 12.4% since 11/24/22. 10% GOAL MET. Did not measure L thigh or full limb today since we have not been bandaging above the knee.    Volume change since initial L LEG (A-D) volume reduction since commencing CDT on 09/02/22 measures 13.8% 10% GOAL MET.   30 th VISIT 01/11/23  LANDMARK RLE  L LEG (A-D) 5691.5  ml  L THIGH (E-G)  ml  L FULL LIMB (A-G)  ml  Limb Volume since initial 11/01/22 DECREASED BY 4.3%  Volume change overall      39 th VISIT 03/28/23  LANDMARK RLE  L LEG (A-D) 5632.0 ml  L THIGH (E-G)  ml  L FULL LIMB (A-G)  ml  Limb Volume since initial 11/01/22 DECREASED BY 1.05 % since last measured on 02/20/23; and DECREASED by 5.4%  since  09/02/22  Volume change overall     TODAY'S TREATMENT:     MLD to RLE/RLQ as established. Simultaneous skin care to limit infection. 2. RLE multilayer , knee length gradient compression  bandaging 3. Pt/ FAMILY EDU   PATIENT EDUCATION:  Continued Pt/ CG edu for lymphedema self care home program throughout session. Topics include outcome of comparative limb volumetrics- starting vs changing limb volumes over time; technology and gradient techniques used for short stretch, multilayer compression wrapping, simple self-MLD, therapeutic lymphatic pumping exercises, skin/nail care, LE precautions,. compression garment recommendations and specifications, donning and doffing gadgets, wear and care schedule and compression garment donning / doffing w assistive devices. Discussed progress towards  all OT goals since commencing CDT. All questions answered to the Pt's satisfaction w comfort and fit. Good return. Person educated: Patient and Spouse Education method: Explanation, Demonstration, and Handouts Education comprehension: verbalized understanding, returned demonstration, verbal cues required, and needs further education   HOME EXERCISE PROGRAM: Lymphatic Pumping Therex-  1 set of 10, 2 x daily, in order, seated  Daily skin inspection and care with low ph lotion matching skin ph to limit infection risk Simple Self-MLD 1 x daily During Intensive Phase CDT: multilayer  compression wraps from foot to groin using gradient techniques, one limb at a time  to ensure safety During Self -Management Phase CDT: Fit with custom daytime compression garments: Elvarex, custom, flat knit, ccl 2 ( 23-32 mmHg) knee highs. Consider pairing with Compreshorts Capri length pantyhose for containment of buttocks, thighs and abdomen.  Fit with BLE knee length Jobst RELAX w/ open toe and zipper to limit ongoing tissue fibrosis formation and facilitate increased lymphatic function during HOS  Custom-made gradient compression garments and HOS devices are medically necessary in this case because they are uniquely sized and shaped to fit the exact dimensions of the affected extremities, and to provide accurate and consistent gradient compression essential for optimally managing this patient's symptoms of chronic, progressive lymphedema. Multiple custom compression garments are needed for optimal hygiene. Custom compression garments should be replaced q 3-6 months When worn consistently for optimal effectiveness.  ASSESSMENT: B leg swelling appears well managed.  Pt tolerated MLD and gentle fibrosis techniques to ankle today without pain. She denies dizziness  and SOB w both manual therapy and with compression wraps on L LEG. Added  15 cm wide ace wrap to L knee to add support after recent fall.  Fit R custom stocking ASAP.  Cont as per POC.  OBJECTIVE IMPAIRMENTS: Abnormal gait, decreased activity tolerance, decreased balance, decreased knowledge of condition, decreased knowledge of use of DME, decreased mobility, difficulty walking, decreased ROM, increased edema, impaired sensation, obesity, pain, and chronic , progressive swelling and pain of buttocks, hips, abdomen and legs .   ACTIVITY LIMITATIONS: carrying, lifting, bending, sitting, standing, squatting, sleeping, stairs, transfers, bed mobility, bathing, dressing, hygiene/grooming, caring for others, and leisure pursuits, social  participation, productive/ work activities  PARTICIPATION LIMITATIONS: meal prep, cleaning, laundry, interpersonal relationship, driving, shopping, community activity, occupation, yard work, and church  PERSONAL FACTORS: Age, Past/current experiences, hx of panic attacks and claustrophobia, Time since onset of injury/illness/exacerbation,, motivation, supportive spouse and family are also affecting patient's functional outcome.   REHAB POTENTIAL: Fair Unfortunately the Lipedema does not respond to CDT and is unchanged by this protocol. Fortunately lymphedema  does respond to differing degrees based on the quality and amount of fatty fibrosis present obstructing lymphatics, and patient's tolerance if skin is hypersensitive.   EVALUATION COMPLEXITY: Moderate   GOALS: Goals reviewed with patient? Yes  SHORT TERM GOALS: Target date: 4th OT Rx visit   Pt will demonstrate understanding of lymphedema precautions and prevention strategies with modified independence using a printed reference to identify at least 5 precautions and discussing how s/he may implement them into daily life to reduce risk of progression with modified assistance ( printed reference). Baseline: Max A Goal status: 12/12/22 PROGRESSING 01/11/23 GOAL MET  2.  Pt will be able to apply multilayer, thigh length, compression wraps using gradient techniques with Max caregiver assistance to decrease limb volume, to limit infection risk, and to limit lymphedema progression.  Baseline: Dependent Goal status: 11/10/22 Goal Met  LONG TERM  GOALS: Target date: 04/24/23  Given this patient's Intake score of 36.76 % on the Lymphedema Life Impact Scale (LLIS), patient will experience a reduction of at least 5 points in her perceived level of functional impairment resulting from lymphedema to improve functional performance and quality of life (QOL). Baseline: 36.76% Goal status: 02/20/23 PROGRESSING  2.  Given this patient's Intake score of  56/100% on the functional outcomes FOTO tool, patient will experience an increase in function of 3 points to improve basic and instrumental ADLs performance, including lymphedema self-care.  Baseline: 56% Goal status: 02/20/23 PROGRESSING  3.  During Intensive phase CDT Pt will achieve at least 85% compliance with all lymphedema self-care home program components, including  daily skin care, multilayer , gradient compression wraps with daily changes, daily simple self MLD and daily lymphatic pumping therex to achieve optimal clinical outcome and to habituate self care regime for optimal LE self-management over time. Baseline: Dependent Goal status: 01/09/23 GOAL MET  4.  Pt will achieve at least a 10% volume reductions bilaterally below the knees to return limb to more typical size and shape, to limit infection risk and LE progression, to decrease pain, to improve function, and to improve body image and QOL. Baseline: Dependent Goal status: 12/12/22 PROGRESSING. 01/11/23 GOAL MET for L LEG  03/28/23 Volumetrics reveal that the R leg is DECREASED in volume BY 1.05 % since last measured on 02/20/23; and DECREASED by 5.4% since  09/02/22.   5.  Pt will be able to don and doff appropriate compression garments and devices using assistive devices and extra time within 1 week of issue date for optimal lymphedema self-care. Baseline: Dependent Goal status: 01/25/23 GOAL MET   PLAN:  PT FREQUENCY: 2x/week  PT DURATION: 12  weeks other: and PRN  PLANNED INTERVENTIONS: Therapeutic exercises, Therapeutic activity, Patient/Family education, Self Care, DME instructions, Manual lymph drainage, Compression bandaging, Manual therapy, and skin care during MLD, fit with appropriate custom compression garments and devices, fit with appropriate compression device  which  follows lymphatic pathways and anatomic distribution  PLAN FOR NEXT SESSION:  MLD Compression Skin care Pt/ family edu for LE self care   Loel Dubonnet, MS, OTR/L, CLT-LANA 04/06/23 3:56 PM

## 2023-04-11 ENCOUNTER — Other Ambulatory Visit: Payer: Self-pay | Admitting: Physician Assistant

## 2023-04-12 ENCOUNTER — Ambulatory Visit: Payer: 59 | Attending: Plastic Surgery | Admitting: Occupational Therapy

## 2023-04-12 ENCOUNTER — Encounter: Payer: Self-pay | Admitting: Occupational Therapy

## 2023-04-12 DIAGNOSIS — I890 Lymphedema, not elsewhere classified: Secondary | ICD-10-CM | POA: Diagnosis present

## 2023-04-12 NOTE — Therapy (Signed)
OUTPATIENT OCCUPATIONAL THERAPY TREATMENT NOTE LOWER EXTREMITY LYMPHEDEMA  Patient Name: CAMILLE DRAGAN MRN: [id number] DOB:Aug 14, 1960, 62 y.o., female Today's Date: 04/12/2023    END OF SESSION:  OT End of Session - 04/12/23 0818     Visit Number 43    Number of Visits 72    Date for OT Re-Evaluation 04/24/23    OT Start Time 0810    OT Stop Time 0905    OT Time Calculation (min) 55 min    Activity Tolerance Patient tolerated treatment well;No increased pain                 Past Medical History:  Diagnosis Date   Anemia    low iron at times   Arthritis    Colon polyps    Complication of anesthesia 1991   Pt reports she awoke during plantar fascia surgery and felt "everything"   Heart murmur    " small per patient"    Hyperlipidemia    Hypertension    Hypothyroidism    Lactose intolerance    PAC (premature atrial contraction)    Palpitations    Panic attacks    Pt reports restrictive clothing/areas and dry mouth cause her to have attack.   Peripheral vascular disease (HCC)    "small" clot after thermal ablasion   Pneumonia 2016   "walking pneumonia" 3 times in 2016   Shortness of breath    going upstairs (due to weight). not so much since weight loss   Sleep apnea    after weight loss does not have to use cpap   Vitamin B12 deficiency    Vitamin D deficiency    Past Surgical History:  Procedure Laterality Date   APPENDECTOMY  1985   CESAREAN SECTION  1987   twins   COLONOSCOPY     COLONOSCOPY WITH PROPOFOL N/A 02/27/2018   Procedure: COLONOSCOPY WITH PROPOFOL;  Surgeon: Toledo, Boykin Nearing, MD;  Location: [facility];  Service: Gastroenterology;  Laterality: N/A;   IRRIGATION AND DEBRIDEMENT HEMATOMA Right 11/12/2015   Procedure: IRRIGATION AND DEBRIDEMENT HEMATOMA;  Surgeon: Earline Mayotte, MD;  Location: [facility];  Service: General;  Laterality: Right;   KNEE ARTHROSCOPY WITH MEDIAL MENISECTOMY Left 07/15/2020   Procedure: Left knee arthroscopy  with partial medial or lateral menisectomy, possible chondroplasty, possible partial synovectomy;  Surgeon: Lyndle Herrlich, MD;  Location: [facility];  Service: Orthopedics;  Laterality: Left;   LAMINECTOMY  07/10/2013   L4   L5   DR Yetta Barre   LAPAROSCOPIC SALPINGO OOPHERECTOMY Right 04/17/2014   Procedure: LAPAROSCOPIC SALPINGO OOPHORECTOMY RIGHT;  Surgeon: Adolphus Birchwood, MD;  Location: [facility];  Service: Gynecology;  Laterality: Right;   LYSIS OF ADHESION  04/17/2014   Procedure: LYSIS OF ADHESION;  Surgeon: Adolphus Birchwood, MD;  Location: [facility];  Service: Gynecology;;   MAXIMUM ACCESS (MAS)POSTERIOR LUMBAR INTERBODY FUSION (PLIF) 1 LEVEL  07/10/2013   Procedure: FOR MAXIMUM ACCESS (MAS) POSTERIOR LUMBAR INTERBODY FUSION (PLIF) LUMBAR FOUR-FIVE;  Surgeon: Tia Alert, MD;  Location: [facility];  Service: Neurosurgery;;  FOR MAXIMUM ACCESS (MAS) POSTERIOR LUMBAR INTERBODY FUSION (PLIF) LUMBAR FOUR-FIVE   OOPHORECTOMY     LSO   OPEN REDUCTION INTERNAL FIXATION (ORIF) DISTAL RADIAL FRACTURE Left 09/20/2018   Procedure: OPEN REDUCTION INTERNAL FIXATION (ORIF) DISTAL RADIAL FRACTURE, LEFT;  Surgeon: Kennedy Bucker, MD;  Location: [facility];  Service: Orthopedics;  Laterality: Left;   PLANTAR FASCIA SURGERY  1999   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  VAGINAL HYSTERECTOMY  2001   for bleeding and LSO for cyst   Patient Active Problem List   Diagnosis Date Noted   Reactive hypoglycemia 02/16/2022   Prediabetes 02/16/2022   Chronic venous insufficiency 12/23/2021   Lymphedema 12/13/2021   Panic attacks    Strain of knee 12/24/2019   Vitamin B12 deficiency 10/08/2019   Arthritis 07/16/2019   Edema 07/16/2019   History of abdominal pain 07/16/2019   Low back pain 07/16/2019   Vitamin D deficiency 07/16/2019   Hypothyroidism 02/28/2019   History of gastric bypass 02/28/2019   Obstructive sleep apnea syndrome 02/28/2019   Insomnia 02/28/2019   Class 3 severe obesity due to excess calories with serious  comorbidity and body mass index (BMI) of 40.0 to 44.9 in adult (HCC) 02/28/2019   Elevated BP without diagnosis of hypertension 02/28/2019   Mixed hyperlipidemia 02/28/2019   Hot flashes 02/28/2019   BMI 40.0-44.9, adult (HCC) 12/16/2015   Hematoma of lower extremity 11/12/2015   Essential hypertension 06/20/2014   Pure hypercholesterolemia 06/20/2014   S/P lumbar spinal fusion 07/10/2013   Arthrodesis status 07/10/2013   Palpitations 01/02/2012   Edema of both legs 01/02/2012    PCP: Joni Reining, PA-C  REFERRING PROVIDER: Foster Simpson, DO  REFERRING DIAG: I89.0  THERAPY DIAG:  Lymphedema, not elsewhere classified  Rationale for Evaluation and Treatment: Rehabilitation  ONSET DATE: Insidious onset with progression over time. Greater than 20 years  SUBJECTIVE:                                                                                                                                                                                          SUBJECTIVE STATEMENT:Carinna B Tollett presents to OT visit to address BLE lipo-lymphedema. Pt is accompanied by her supportive spouse, [name]. Pt denies LE-related pain in her legs. Pt has no new concerns. Pt is agreeable to completing RLE initial garment measurements today.  PERTINENT HISTORY and contributing factors include, OA, HTN, Panic attacks 2/2 restrictive clothing, OSA (not using CPAP), s/p hysterectomy 1987, L knee sx, oophorectomy, s/p hysterectomy 2001, Gastric Bypass 2007, s/p plantar fascia sx 1999, obesity, insomnia  PAIN:  Are you having LE-related pain? No: NPRS scale: 0/10.  Pain location: BLE- generalized, knees Pain description: discomfort, heavy, tired, full, sore Aggravating factors: standing, walking, extended dependent sitting Relieving factors: elevation  PRECAUTIONS: Other: LYMPHEDEMA PRECAUTIONS: HYPOTHYROID, DIABETES SKIN PRECAUTIONS  WEIGHT BEARING RESTRICTIONS: No  FALLS:  Has patient fallen since  last visit? 04/05/23 YES Pt had a  fall in the middle of the night. She states she got up to go to the bathroom and when rounding the  bottom of the bed she passed out and woke up on the floor. Pt was able to get herself up . She is sore today, but uninjured by report. She was wearing the portable heart monitor at the time.   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  BLE COMPARATIVE LIMB VOLUMETRICS:  INTAKE: 11/01/22  LANDMARK RIGHT    R LEG (A-D) 5947.2 ml  R THIGH (E-G) 9750.0 ml  R FULL LIMB (A-G) 15697.2 ml  Limb Volume differential (LVD)  %  Volume change since initial %  Volume change overall V  (Blank rows = not tested)  LANDMARK LEFT   L LEG (A-D) 6011.6 ml  L THIGH (E-G) 9602.5 ml  L FULL LIMB (A-G) 15614.1 ml  Limb Volume differential (LVD)  LEG LVD = 1.8%, L>R % THIGH LVD = 1.5%, R>L FULL LIMB LVD = 0.53%, R>L  Volume change since initial %  Volume change overall %  (Blank rows = not tested)   9th VISIT 11/24/22  LANDMARK LEFT   L LEG (A-D) 5918.6 ml  L THIGH (E-G) 9265.5 ml  L FULL LIMB (A-G) 15184.1  ml  Limb Volume differential (LVD)  LEG volume = Decreased 1.5%;  THIGH volume decreased 3.5 %, AND  L FULL LIMB VOLUME dec BY 2.8 % SINCE 11/01/22  Volume change since initial %  Volume change overall %    20 th VISIT 01/11/23  LANDMARK LEFT   L LEG (A-D) 5181.2 ml  L THIGH (E-G)  ml  L FULL LIMB (A-G)  ml  Limb Volume differential (LVD)  L LEG (A-D) volume = Decreased 12.4% since 11/24/22. 10% GOAL MET. Did not measure L thigh or full limb today since we have not been bandaging above the knee.   Volume change since initial L LEG (A-D) volume  reduction since commencing CDT on 09/02/22 measures 13.8% 10% GOAL MET.   30 th VISIT 01/11/23  LANDMARK RLE  L LEG (A-D) 5691.5  ml  L THIGH (E-G)  ml  L FULL LIMB (A-G)  ml  Limb Volume since initial 11/01/22 DECREASED BY 4.3%  Volume change overall    39 th VISIT 03/28/23  LANDMARK RLE  L LEG (A-D) 5632.0 ml  L THIGH (E-G)  ml  L FULL LIMB (A-G)  ml  Limb Volume since initial 11/01/22 DECREASED BY 1.05 % since last measured on 02/20/23; and DECREASED by 5.4%  since  09/02/22  Volume change overall    FOTO functional outcome score:      Lymphedema Life Impact Scale (LLIS):   Date FOTO LLIS  Initial 10/13/22 56%   Final 04/12/23 55%    Initial 10/13/22  36.76%  Final 04/12/23  25% ( 11.76% reduction)    TODAY'S TREATMENT:     RLE initial compression garment measurements  RLE multilayer , knee length gradient compression  bandaging Pt/ FAMILY EDU   PATIENT EDUCATION:  Continued Pt/ CG edu for lymphedema self care home program throughout session. Topics include outcome of comparative limb volumetrics- starting vs changing limb volumes over time; technology and gradient techniques used for short stretch, multilayer compression wrapping, simple self-MLD, therapeutic lymphatic pumping exercises, skin/nail care, LE precautions,.  compression garment recommendations and specifications, donning and doffing gadgets, wear and care schedule and compression garment donning / doffing w assistive devices. Discussed progress towards all OT goals since commencing CDT. All questions answered to the Pt's satisfaction w comfort and fit. Good return. Person educated: Patient and Spouse Education method: Explanation, Demonstration, and Handouts Education comprehension: verbalized understanding, returned demonstration, verbal cues required, and needs further education   HOME EXERCISE PROGRAM: Lymphatic Pumping Therex-  1 set of 10, 2 x daily, in order, seated  Daily skin inspection and care with low ph  lotion matching skin ph to limit infection risk Simple Self-MLD 1 x daily During Intensive Phase CDT: multilayer compression wraps from foot to groin using gradient techniques, one limb at a time  to ensure safety During Self -Management Phase CDT: Fit with custom daytime compression garments: Elvarex, custom, flat knit, ccl 2 ( 23-32 mmHg) knee highs. Consider pairing with Compreshorts Capri length pantyhose for containment of buttocks, thighs and abdomen.  Fit with BLE knee length Jobst RELAX w/ open toe and zipper to limit ongoing tissue fibrosis formation and facilitate increased lymphatic function during HOS  Custom-made gradient compression garments and HOS devices are medically necessary in this case because they are uniquely sized and shaped to fit the exact dimensions of the affected extremities, and to provide accurate and consistent gradient compression essential for optimally managing this patient's symptoms of chronic, progressive lymphedema. Multiple custom compression garments are needed for optimal hygiene. Custom compression garments should be replaced q 3-6 months When worn consistently for optimal effectiveness.  ASSESSMENT: Completed initial anatomical measurements and specifications for custom, knee length, RLE knee high compression garment, ccl 2 ( 23-32 mmHg) and Jobst RELAX HOS device. Faxed to DME vendor after session.   Pt completed final LLIS and FOTO assessments. Functional outcome FOTO scale is essentially unchanged when compared with initial completed on 4/4/2.  Functional level score is decreased by 1%. GOAL NOT MET. On the other hand the Lymphedema Life Impact Scale (LLIS) score reveals an 11.76% reduction in the degree to which LE related problems affected the Pt's life last week.   We reapplied R leg compression wraps as established. We'll fit R custom stocking ASAP.  Cont 2 x weekly as per POC.  OBJECTIVE IMPAIRMENTS: Abnormal gait, decreased activity tolerance,  decreased balance, decreased knowledge of condition, decreased knowledge of use of DME, decreased mobility, difficulty walking, decreased ROM, increased edema, impaired sensation, obesity, pain, and chronic , progressive swelling and pain of buttocks, hips, abdomen and legs .   ACTIVITY LIMITATIONS: carrying, lifting, bending, sitting, standing, squatting, sleeping, stairs, transfers, bed mobility, bathing, dressing, hygiene/grooming, caring for others, and leisure pursuits, social participation, productive/ work activities  PARTICIPATION LIMITATIONS: meal prep, cleaning, laundry, interpersonal relationship, driving, shopping, community activity, occupation, yard work, and church  PERSONAL FACTORS: Age, Past/current experiences, hx of panic attacks and claustrophobia, Time since onset of injury/illness/exacerbation,, motivation, supportive spouse and family are also affecting patient's functional outcome.   REHAB POTENTIAL: Fair Unfortunately the Lipedema does not respond to CDT and is unchanged by this protocol. Fortunately lymphedema  does respond to differing degrees based on the quality and amount of fatty fibrosis present obstructing lymphatics, and patient's tolerance if skin is hypersensitive.   EVALUATION COMPLEXITY: Moderate   GOALS: Goals reviewed with patient? Yes  SHORT TERM GOALS: Target date: 4th OT Rx visit   Pt will demonstrate understanding of lymphedema precautions and prevention strategies with modified independence using a printed reference to identify  at least 5 precautions and discussing how s/he may implement them into daily life to reduce risk of progression with modified assistance ( printed reference). Baseline: Max A Goal status: 12/12/22 PROGRESSING 01/11/23 GOAL MET  2.  Pt will be able to apply multilayer, thigh length, compression wraps using gradient techniques with Max caregiver assistance to decrease limb volume, to limit infection risk, and to limit lymphedema  progression.  Baseline: Dependent Goal status: 11/10/22 Goal Met  LONG TERM GOALS: Target date: 04/24/23  Given this patient's Intake score of 36.76 % on the Lymphedema Life Impact Scale (LLIS), patient will experience a reduction of at least 5 points in her perceived level of functional impairment resulting from lymphedema to improve functional performance and quality of life (QOL). Baseline: 36.76% Goal status: 02/20/23 PROGRESSING  2.  Given this patient's Intake score of 56/100% on the functional outcomes FOTO tool, patient will experience an increase in function of 3 points to improve basic and instrumental ADLs performance, including lymphedema self-care.  Baseline: 56% Goal status: 02/20/23 PROGRESSING  3.  During Intensive phase CDT Pt will achieve at least 85% compliance with all lymphedema self-care home program components, including  daily skin care, multilayer , gradient compression wraps with daily changes, daily simple self MLD and daily lymphatic pumping therex to achieve optimal clinical outcome and to habituate self care regime for optimal LE self-management over time. Baseline: Dependent Goal status: 01/09/23 GOAL MET  4.  Pt will achieve at least a 10% volume reductions bilaterally below the knees to return limb to more typical size and shape, to limit infection risk and LE progression, to decrease pain, to improve function, and to improve body image and QOL. Baseline: Dependent Goal status: 12/12/22 PROGRESSING. 01/11/23 GOAL MET for L LEG  03/28/23 Volumetrics reveal that the R leg is DECREASED in volume BY 1.05 % since last measured on 02/20/23; and DECREASED by 5.4% since  09/02/22.   5.  Pt will be able to don and doff appropriate compression garments and devices using assistive devices and extra time within 1 week of issue date for optimal lymphedema self-care. Baseline: Dependent Goal status: 01/25/23 GOAL MET   PLAN:  PT FREQUENCY: 2x/week  PT DURATION: 12  weeks other:  and PRN  PLANNED INTERVENTIONS: Therapeutic exercises, Therapeutic activity, Patient/Family education, Self Care, DME instructions, Manual lymph drainage, Compression bandaging, Manual therapy, and skin care during MLD, fit with appropriate custom compression garments and devices, fit with appropriate compression device  which  follows lymphatic pathways and anatomic distribution  PLAN FOR NEXT SESSION:  MLD Compression Skin care Pt/ family edu for LE self care   Loel Dubonnet, MS, OTR/L, CLT-LANA 04/12/23 9:06 AM

## 2023-04-13 ENCOUNTER — Other Ambulatory Visit: Payer: Self-pay

## 2023-04-13 DIAGNOSIS — F411 Generalized anxiety disorder: Secondary | ICD-10-CM

## 2023-04-13 MED ORDER — ALPRAZOLAM 0.5 MG PO TABS: 0.5000 mg | ORAL_TABLET | Freq: Two times a day (BID) | ORAL | 0 refills | Status: DC | PRN

## 2023-04-14 ENCOUNTER — Ambulatory Visit: Payer: 59 | Admitting: Occupational Therapy

## 2023-04-14 DIAGNOSIS — I890 Lymphedema, not elsewhere classified: Secondary | ICD-10-CM | POA: Diagnosis not present

## 2023-04-14 NOTE — Therapy (Signed)
OUTPATIENT OCCUPATIONAL THERAPY TREATMENT NOTE LOWER EXTREMITY LYMPHEDEMA  Patient Name: Mary [name] MRN: [id number] DOB:1961/01/10, 62 y.o., female Today's Date: 04/14/2023    END OF SESSION:  OT End of Session - 04/14/23 0805     Visit Number 44    Number of Visits 72    Date for OT Re-Evaluation 04/24/23    OT Start Time 0802    OT Stop Time 0900    OT Time Calculation (min) 58 min    Activity Tolerance Patient tolerated treatment well;No increased pain                 Past Medical History:  Diagnosis Date   Anemia    low iron at times   Arthritis    Colon polyps    Complication of anesthesia 1991   Pt reports she awoke during plantar fascia surgery and felt "everything"   Heart murmur    " small per patient"    Hyperlipidemia    Hypertension    Hypothyroidism    Lactose intolerance    PAC (premature atrial contraction)    Palpitations    Panic attacks    Pt reports restrictive clothing/areas and dry mouth cause her to have attack.   Peripheral vascular disease (HCC)    "small" clot after thermal ablasion   Pneumonia 2016   "walking pneumonia" 3 times in 2016   Shortness of breath    going upstairs (due to weight). not so much since weight loss   Sleep apnea    after weight loss does not have to use cpap   Vitamin B12 deficiency    Vitamin D deficiency    Past Surgical History:  Procedure Laterality Date   APPENDECTOMY  1985   CESAREAN SECTION  1987   twins   COLONOSCOPY     COLONOSCOPY WITH PROPOFOL N/A 02/27/2018   Procedure: COLONOSCOPY WITH PROPOFOL;  Surgeon: Toledo, Boykin Nearing, MD;  Location: [facility];  Service: Gastroenterology;  Laterality: N/A;   IRRIGATION AND DEBRIDEMENT HEMATOMA Right 11/12/2015   Procedure: IRRIGATION AND DEBRIDEMENT HEMATOMA;  Surgeon: Earline Mayotte, MD;  Location: [facility];  Service: General;  Laterality: Right;   KNEE ARTHROSCOPY WITH MEDIAL MENISECTOMY Left 07/15/2020   Procedure: Left knee arthroscopy  with partial medial or lateral menisectomy, possible chondroplasty, possible partial synovectomy;  Surgeon: Lyndle Herrlich, MD;  Location: [facility];  Service: Orthopedics;  Laterality: Left;   LAMINECTOMY  07/10/2013   L4   L5   DR Yetta Barre   LAPAROSCOPIC SALPINGO OOPHERECTOMY Right 04/17/2014   Procedure: LAPAROSCOPIC SALPINGO OOPHORECTOMY RIGHT;  Surgeon: Adolphus Birchwood, MD;  Location: [facility];  Service: Gynecology;  Laterality: Right;   LYSIS OF ADHESION  04/17/2014   Procedure: LYSIS OF ADHESION;  Surgeon: Adolphus Birchwood, MD;  Location: [facility];  Service: Gynecology;;   MAXIMUM ACCESS (MAS)POSTERIOR LUMBAR INTERBODY FUSION (PLIF) 1 LEVEL  07/10/2013   Procedure: FOR MAXIMUM ACCESS (MAS) POSTERIOR LUMBAR INTERBODY FUSION (PLIF) LUMBAR FOUR-FIVE;  Surgeon: Tia Alert, MD;  Location: [facility];  Service: Neurosurgery;;  FOR MAXIMUM ACCESS (MAS) POSTERIOR LUMBAR INTERBODY FUSION (PLIF) LUMBAR FOUR-FIVE   OOPHORECTOMY     LSO   OPEN REDUCTION INTERNAL FIXATION (ORIF) DISTAL RADIAL FRACTURE Left 09/20/2018   Procedure: OPEN REDUCTION INTERNAL FIXATION (ORIF) DISTAL RADIAL FRACTURE, LEFT;  Surgeon: Kennedy Bucker, MD;  Location: [facility];  Service: Orthopedics;  Laterality: Left;   PLANTAR FASCIA SURGERY  1999   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  VAGINAL HYSTERECTOMY  2001   for bleeding and LSO for cyst   Patient Active Problem List   Diagnosis Date Noted   Reactive hypoglycemia 02/16/2022   Prediabetes 02/16/2022   Chronic venous insufficiency 12/23/2021   Lymphedema 12/13/2021   Panic attacks    Strain of knee 12/24/2019   Vitamin B12 deficiency 10/08/2019   Arthritis 07/16/2019   Edema 07/16/2019   History of abdominal pain 07/16/2019   Low back pain 07/16/2019   Vitamin D deficiency 07/16/2019   Hypothyroidism 02/28/2019   History of gastric bypass 02/28/2019   Obstructive sleep apnea syndrome 02/28/2019   Insomnia 02/28/2019   Class 3 severe obesity due to excess calories with serious  comorbidity and body mass index (BMI) of 40.0 to 44.9 in adult (HCC) 02/28/2019   Elevated BP without diagnosis of hypertension 02/28/2019   Mixed hyperlipidemia 02/28/2019   Hot flashes 02/28/2019   BMI 40.0-44.9, adult (HCC) 12/16/2015   Hematoma of lower extremity 11/12/2015   Essential hypertension 06/20/2014   Pure hypercholesterolemia 06/20/2014   S/P lumbar spinal fusion 07/10/2013   Arthrodesis status 07/10/2013   Palpitations 01/02/2012   Edema of both legs 01/02/2012    PCP: Joni Reining, PA-C  REFERRING PROVIDER: Foster Simpson, DO  REFERRING DIAG: I89.0  THERAPY DIAG:  Lymphedema, not elsewhere classified  Rationale for Evaluation and Treatment: Rehabilitation  ONSET DATE: Insidious onset with progression over time. Greater than 20 years  SUBJECTIVE:                                                                                                                                                                                          SUBJECTIVE STATEMENT:Mary [name] presents to OT visit to address BLE lipo-lymphedema. Pt is accompanied by her supportive spouse, Mary [name]. Pt denies LE-related pain in her legs. Pt has no new concerns. Pt is agreeable to completing RLE initial garment measurements today.  PERTINENT HISTORY and contributing factors include, OA, HTN, Panic attacks 2/2 restrictive clothing, OSA (not using CPAP), s/p hysterectomy 1987, L knee sx, oophorectomy, s/p hysterectomy 2001, Gastric Bypass 2007, s/p plantar fascia sx 1999, obesity, insomnia  PAIN:  Are you having LE-related pain? No: NPRS scale: 0/10.  Pain location: BLE- generalized, knees Pain description: discomfort, heavy, tired, full, sore Aggravating factors: standing, walking, extended dependent sitting Relieving factors: elevation  PRECAUTIONS: Other: LYMPHEDEMA PRECAUTIONS: HYPOTHYROID, DIABETES SKIN PRECAUTIONS  WEIGHT BEARING RESTRICTIONS: No  FALLS:  Has patient fallen since  last visit? 04/05/23 YES Pt had a  fall in the middle of the night. She states she got up to go to the bathroom and when rounding the  bottom of the bed she passed out and woke up on the floor. Pt was able to get herself up . She is sore today, but uninjured by report. She was wearing the portable heart monitor at the time.   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  BLE COMPARATIVE LIMB VOLUMETRICS:  INTAKE: 11/01/22  LANDMARK RIGHT    R LEG (A-D) 5947.2 ml  R THIGH (E-G) 9750.0 ml  R FULL LIMB (A-G) 15697.2 ml  Limb Volume differential (LVD)  %  Volume change since initial %  Volume change overall V  (Blank rows = not tested)  LANDMARK LEFT   L LEG (A-D) 6011.6 ml  L THIGH (E-G) 9602.5 ml  L FULL LIMB (A-G) 15614.1 ml  Limb Volume differential (LVD)  LEG LVD = 1.8%, L>R % THIGH LVD = 1.5%, R>L FULL LIMB LVD = 0.53%, R>L  Volume change since initial %  Volume change overall %  (Blank rows = not tested)   9th VISIT 11/24/22  LANDMARK LEFT   L LEG (A-D) 5918.6 ml  L THIGH (E-G) 9265.5 ml  L FULL LIMB (A-G) 15184.1  ml  Limb Volume differential (LVD)  LEG volume = Decreased 1.5%;  THIGH volume decreased 3.5 %, AND  L FULL LIMB VOLUME dec BY 2.8 % SINCE 11/01/22  Volume change since initial %  Volume change overall %    20 th VISIT 01/11/23  LANDMARK LEFT   L LEG (A-D) 5181.2 ml  L THIGH (E-G)  ml  L FULL LIMB (A-G)  ml  Limb Volume differential (LVD)  L LEG (A-D) volume = Decreased 12.4% since 11/24/22. 10% GOAL MET. Did not measure L thigh or full limb today since we have not been bandaging above the knee.   Volume change since initial L LEG (A-D) volume  reduction since commencing CDT on 09/02/22 measures 13.8% 10% GOAL MET.   30 th VISIT 01/11/23  LANDMARK RLE  L LEG (A-D) 5691.5  ml  L THIGH (E-G)  ml  L FULL LIMB (A-G)  ml  Limb Volume since initial 11/01/22 DECREASED BY 4.3%  Volume change overall    39 th VISIT 03/28/23  LANDMARK RLE  L LEG (A-D) 5632.0 ml  L THIGH (E-G)  ml  L FULL LIMB (A-G)  ml  Limb Volume since initial 11/01/22 DECREASED BY 1.05 % since last measured on 02/20/23; and DECREASED by 5.4%  since  09/02/22  Volume change overall    FOTO functional outcome score:      Lymphedema Life Impact Scale (LLIS):   Date FOTO LLIS  Initial 10/13/22 56%   Final 04/12/23 55%    Initial 10/13/22  36.76%  Final 04/12/23  25% ( 11.76% reduction)    TODAY'S TREATMENT:     RLE/RLQ MLD  RLE multilayer , knee length gradient compression  bandaging Pt/ FAMILY EDU   PATIENT EDUCATION:  Continued Pt/ CG edu for lymphedema self care home program throughout session. Topics include outcome of comparative limb volumetrics- starting vs changing limb volumes over time; technology and gradient techniques used for short stretch, multilayer compression wrapping, simple self-MLD, therapeutic lymphatic pumping exercises, skin/nail care, LE precautions,. compression garment recommendations  and specifications, donning and doffing gadgets, wear and care schedule and compression garment donning / doffing w assistive devices. Discussed progress towards all OT goals since commencing CDT. All questions answered to the Pt's satisfaction w comfort and fit. Good return. Person educated: Patient and Spouse Education method: Explanation, Demonstration, and Handouts Education comprehension: verbalized understanding, returned demonstration, verbal cues required, and needs further education   HOME EXERCISE PROGRAM: Lymphatic Pumping Therex-  1 set of 10, 2 x daily, in order, seated  Daily skin inspection and care with low ph lotion matching skin ph to limit  infection risk Simple Self-MLD 1 x daily During Intensive Phase CDT: multilayer compression wraps from foot to groin using gradient techniques, one limb at a time  to ensure safety During Self -Management Phase CDT: Fit with custom daytime compression garments: Elvarex, custom, flat knit, ccl 2 ( 23-32 mmHg) knee highs. Consider pairing with Compreshorts Capri length pantyhose for containment of buttocks, thighs and abdomen.  Fit with BLE knee length Jobst RELAX w/ open toe and zipper to limit ongoing tissue fibrosis formation and facilitate increased lymphatic function during HOS  Custom-made gradient compression garments and HOS devices are medically necessary in this case because they are uniquely sized and shaped to fit the exact dimensions of the affected extremities, and to provide accurate and consistent gradient compression essential for optimally managing this patient's symptoms of chronic, progressive lymphedema. Multiple custom compression garments are needed for optimal hygiene. Custom compression garments should be replaced q 3-6 months When worn consistently for optimal effectiveness.  ASSESSMENT: Continued MLD to RLE/RLQ today with emphasis on ankle and distal leg fibrosis. Pt denied pain with maual therapy. Reapplied compression wraps as established. We reapplied R leg compression wraps as established. We'll fit R custom stocking ASAP.  Cont 2 x weekly as per POC.  OBJECTIVE IMPAIRMENTS: Abnormal gait, decreased activity tolerance, decreased balance, decreased knowledge of condition, decreased knowledge of use of DME, decreased mobility, difficulty walking, decreased ROM, increased edema, impaired sensation, obesity, pain, and chronic , progressive swelling and pain of buttocks, hips, abdomen and legs .   ACTIVITY LIMITATIONS: carrying, lifting, bending, sitting, standing, squatting, sleeping, stairs, transfers, bed mobility, bathing, dressing, hygiene/grooming, caring for others, and  leisure pursuits, social participation, productive/ work activities  PARTICIPATION LIMITATIONS: meal prep, cleaning, laundry, interpersonal relationship, driving, shopping, community activity, occupation, yard work, and church  PERSONAL FACTORS: Age, Past/current experiences, hx of panic attacks and claustrophobia, Time since onset of injury/illness/exacerbation,, motivation, supportive spouse and family are also affecting patient's functional outcome.   REHAB POTENTIAL: Fair Unfortunately the Lipedema does not respond to CDT and is unchanged by this protocol. Fortunately lymphedema  does respond to differing degrees based on the quality and amount of fatty fibrosis present obstructing lymphatics, and patient's tolerance if skin is hypersensitive.   EVALUATION COMPLEXITY: Moderate   GOALS: Goals reviewed with patient? Yes  SHORT TERM GOALS: Target date: 4th OT Rx visit   Pt will demonstrate understanding of lymphedema precautions and prevention strategies with modified independence using a printed reference to identify at least 5 precautions and discussing how s/he may implement them into daily life to reduce risk of progression with modified assistance ( printed reference). Baseline: Max A Goal status: 12/12/22 PROGRESSING 01/11/23 GOAL MET  2.  Pt will be able to apply multilayer, thigh length, compression wraps using gradient techniques with Max caregiver assistance to decrease limb volume, to limit infection risk, and to limit lymphedema progression.  Baseline: Dependent Goal status: 11/10/22  Goal Met  LONG TERM GOALS: Target date: 04/24/23  Given this patient's Intake score of 36.76 % on the Lymphedema Life Impact Scale (LLIS), patient will experience a reduction of at least 5 points in her perceived level of functional impairment resulting from lymphedema to improve functional performance and quality of life (QOL). Baseline: 36.76% Goal status: 02/20/23 PROGRESSING  2.  Given this  patient's Intake score of 56/100% on the functional outcomes FOTO tool, patient will experience an increase in function of 3 points to improve basic and instrumental ADLs performance, including lymphedema self-care.  Baseline: 56% Goal status: 02/20/23 PROGRESSING  3.  During Intensive phase CDT Pt will achieve at least 85% compliance with all lymphedema self-care home program components, including  daily skin care, multilayer , gradient compression wraps with daily changes, daily simple self MLD and daily lymphatic pumping therex to achieve optimal clinical outcome and to habituate self care regime for optimal LE self-management over time. Baseline: Dependent Goal status: 01/09/23 GOAL MET  4.  Pt will achieve at least a 10% volume reductions bilaterally below the knees to return limb to more typical size and shape, to limit infection risk and LE progression, to decrease pain, to improve function, and to improve body image and QOL. Baseline: Dependent Goal status: 12/12/22 PROGRESSING. 01/11/23 GOAL MET for L LEG  03/28/23 Volumetrics reveal that the R leg is DECREASED in volume BY 1.05 % since last measured on 02/20/23; and DECREASED by 5.4% since  09/02/22.   5.  Pt will be able to don and doff appropriate compression garments and devices using assistive devices and extra time within 1 week of issue date for optimal lymphedema self-care. Baseline: Dependent Goal status: 01/25/23 GOAL MET   PLAN:  PT FREQUENCY: 2x/week  PT DURATION: 12  weeks other: and PRN  PLANNED INTERVENTIONS: Therapeutic exercises, Therapeutic activity, Patient/Family education, Self Care, DME instructions, Manual lymph drainage, Compression bandaging, Manual therapy, and skin care during MLD, fit with appropriate custom compression garments and devices, fit with appropriate compression device  which  follows lymphatic pathways and anatomic distribution  PLAN FOR NEXT SESSION:  MLD Compression Skin care Pt/ family edu for LE  self care   Loel Dubonnet, MS, OTR/L, CLT-LANA 04/14/23 9:01 AM

## 2023-04-17 ENCOUNTER — Ambulatory Visit: Payer: 59 | Admitting: Occupational Therapy

## 2023-04-18 ENCOUNTER — Ambulatory Visit: Payer: 59 | Admitting: Occupational Therapy

## 2023-04-18 DIAGNOSIS — I890 Lymphedema, not elsewhere classified: Secondary | ICD-10-CM

## 2023-04-18 NOTE — Therapy (Signed)
OUTPATIENT OCCUPATIONAL THERAPY TREATMENT NOTE LOWER EXTREMITY LYMPHEDEMA  Patient Name: Mary [name] MRN: [id number] DOB:[birth year], 62 y.o., female Today's Date: 04/18/2023    END OF SESSION:  OT End of Session - 04/18/23 1511     Visit Number 45    Number of Visits 72    Date for OT Re-Evaluation 04/24/23    OT Start Time 0305    OT Stop Time 0405    OT Time Calculation (min) 60 min    Activity Tolerance Patient tolerated treatment well;No increased pain                 Past Medical History:  Diagnosis Date   Anemia    low iron at times   Arthritis    Colon polyps    Complication of anesthesia 1991   Pt reports she awoke during plantar fascia surgery and felt "everything"   Heart murmur    " small per patient"    Hyperlipidemia    Hypertension    Hypothyroidism    Lactose intolerance    PAC (premature atrial contraction)    Palpitations    Panic attacks    Pt reports restrictive clothing/areas and dry mouth cause her to have attack.   Peripheral vascular disease (HCC)    "small" clot after thermal ablasion   Pneumonia 2016   "walking pneumonia" 3 times in 2016   Shortness of breath    going upstairs (due to weight). not so much since weight loss   Sleep apnea    after weight loss does not have to use cpap   Vitamin B12 deficiency    Vitamin D deficiency    Past Surgical History:  Procedure Laterality Date   APPENDECTOMY  1985   CESAREAN SECTION  1987   twins   COLONOSCOPY     COLONOSCOPY WITH PROPOFOL N/A 02/27/2018   Procedure: COLONOSCOPY WITH PROPOFOL;  Surgeon: Toledo, Boykin Nearing, MD;  Location: [facility];  Service: Gastroenterology;  Laterality: N/A;   IRRIGATION AND DEBRIDEMENT HEMATOMA Right 11/12/2015   Procedure: IRRIGATION AND DEBRIDEMENT HEMATOMA;  Surgeon: Earline Mayotte, MD;  Location: [facility];  Service: General;  Laterality: Right;   KNEE ARTHROSCOPY WITH MEDIAL MENISECTOMY Left 07/15/2020   Procedure: Left knee arthroscopy  with partial medial or lateral menisectomy, possible chondroplasty, possible partial synovectomy;  Surgeon: Lyndle Herrlich, MD;  Location: [facility];  Service: Orthopedics;  Laterality: Left;   LAMINECTOMY  07/10/2013   L4   L5   DR Yetta Barre   LAPAROSCOPIC SALPINGO OOPHERECTOMY Right 04/17/2014   Procedure: LAPAROSCOPIC SALPINGO OOPHORECTOMY RIGHT;  Surgeon: Adolphus Birchwood, MD;  Location: [facility];  Service: Gynecology;  Laterality: Right;   LYSIS OF ADHESION  04/17/2014   Procedure: LYSIS OF ADHESION;  Surgeon: Adolphus Birchwood, MD;  Location: [facility];  Service: Gynecology;;   MAXIMUM ACCESS (MAS)POSTERIOR LUMBAR INTERBODY FUSION (PLIF) 1 LEVEL  07/10/2013   Procedure: FOR MAXIMUM ACCESS (MAS) POSTERIOR LUMBAR INTERBODY FUSION (PLIF) LUMBAR FOUR-FIVE;  Surgeon: Tia Alert, MD;  Location: [facility];  Service: Neurosurgery;;  FOR MAXIMUM ACCESS (MAS) POSTERIOR LUMBAR INTERBODY FUSION (PLIF) LUMBAR FOUR-FIVE   OOPHORECTOMY     LSO   OPEN REDUCTION INTERNAL FIXATION (ORIF) DISTAL RADIAL FRACTURE Left 09/20/2018   Procedure: OPEN REDUCTION INTERNAL FIXATION (ORIF) DISTAL RADIAL FRACTURE, LEFT;  Surgeon: Kennedy Bucker, MD;  Location: [facility];  Service: Orthopedics;  Laterality: Left;   PLANTAR FASCIA SURGERY  1999   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  VAGINAL HYSTERECTOMY  2001   for bleeding and LSO for cyst   Patient Active Problem List   Diagnosis Date Noted   Reactive hypoglycemia 02/16/2022   Prediabetes 02/16/2022   Chronic venous insufficiency 12/23/2021   Lymphedema 12/13/2021   Panic attacks    Strain of knee 12/24/2019   Vitamin B12 deficiency 10/08/2019   Arthritis 07/16/2019   Edema 07/16/2019   History of abdominal pain 07/16/2019   Low back pain 07/16/2019   Vitamin D deficiency 07/16/2019   Hypothyroidism 02/28/2019   History of gastric bypass 02/28/2019   Obstructive sleep apnea syndrome 02/28/2019   Insomnia 02/28/2019   Class 3 severe obesity due to excess calories with serious  comorbidity and body mass index (BMI) of 40.0 to 44.9 in adult (HCC) 02/28/2019   Elevated BP without diagnosis of hypertension 02/28/2019   Mixed hyperlipidemia 02/28/2019   Hot flashes 02/28/2019   BMI 40.0-44.9, adult (HCC) 12/16/2015   Hematoma of lower extremity 11/12/2015   Essential hypertension 06/20/2014   Pure hypercholesterolemia 06/20/2014   S/P lumbar spinal fusion 07/10/2013   Arthrodesis status 07/10/2013   Palpitations 01/02/2012   Edema of both legs 01/02/2012    PCP: Joni Reining, PA-C  REFERRING PROVIDER: Foster Simpson, DO  REFERRING DIAG: I89.0  THERAPY DIAG:  Lymphedema, not elsewhere classified  Rationale for Evaluation and Treatment: Rehabilitation  ONSET DATE: Insidious onset with progression over time. Greater than 20 years  SUBJECTIVE:                                                                                                                                                                                          SUBJECTIVE STATEMENT:Mary [name] presents to OT visit to address BLE lipo-lymphedema. Pt is accompanied by her supportive spouse, [name]. Pt denies LE-related pain in her legs. Pt has no new concerns.  PERTINENT HISTORY and contributing factors include, OA, HTN, Panic attacks 2/2 restrictive clothing, OSA (not using CPAP), s/p hysterectomy 1987, L knee sx, oophorectomy, s/p hysterectomy 2001, Gastric Bypass 2007, s/p plantar fascia sx 1999, obesity, insomnia  PAIN:  Are you having LE-related pain? No: NPRS scale: 0/10.  Pain location: BLE- generalized, knees Pain description: discomfort, heavy, tired, full, sore Aggravating factors: standing, walking, extended dependent sitting Relieving factors: elevation  PRECAUTIONS: Other: LYMPHEDEMA PRECAUTIONS: HYPOTHYROID, DIABETES SKIN PRECAUTIONS  WEIGHT BEARING RESTRICTIONS: No  FALLS:  Has patient fallen since last visit? 04/05/23 YES Pt had a  fall in the middle of the night. She  states she got up to go to the bathroom and when rounding the bottom of the bed she passed out and woke up  on the floor. Pt was able to get herself up . She is sore today, but uninjured by report. She was wearing the portable heart monitor at the time.   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  BLE COMPARATIVE LIMB VOLUMETRICS:  INTAKE: 11/01/22  LANDMARK RIGHT    R LEG (A-D) 5947.2 ml  R THIGH (E-G) 9750.0 ml  R FULL LIMB (A-G) 15697.2 ml  Limb Volume differential (LVD)  %  Volume change since initial %  Volume change overall V  (Blank rows = not tested)  LANDMARK LEFT   L LEG (A-D) 6011.6 ml  L THIGH (E-G) 9602.5 ml  L FULL LIMB (A-G) 15614.1 ml  Limb Volume differential (LVD)  LEG LVD = 1.8%, L>R % THIGH LVD = 1.5%, R>L FULL LIMB LVD = 0.53%, R>L  Volume change since initial %  Volume change overall %  (Blank rows = not tested)   9th VISIT 11/24/22  LANDMARK LEFT   L LEG (A-D) 5918.6 ml  L THIGH (E-G) 9265.5 ml  L FULL LIMB (A-G) 15184.1  ml  Limb Volume differential (LVD)  LEG volume = Decreased 1.5%;  THIGH volume decreased 3.5 %, AND  L FULL LIMB VOLUME dec BY 2.8 % SINCE 11/01/22  Volume change since initial %  Volume change overall %    20 th VISIT 01/11/23  LANDMARK LEFT   L LEG (A-D) 5181.2 ml  L THIGH (E-G)  ml  L FULL LIMB (A-G)  ml  Limb Volume differential (LVD)  L LEG (A-D) volume = Decreased 12.4% since 11/24/22. 10% GOAL MET. Did not measure L thigh or full limb today since we have not been bandaging above the knee.   Volume change since initial L LEG (A-D) volume reduction since commencing CDT on 09/02/22 measures 13.8% 10% GOAL MET.   30  th VISIT 01/11/23  LANDMARK RLE  L LEG (A-D) 5691.5  ml  L THIGH (E-G)  ml  L FULL LIMB (A-G)  ml  Limb Volume since initial 11/01/22 DECREASED BY 4.3%  Volume change overall    39 th VISIT 03/28/23  LANDMARK RLE  L LEG (A-D) 5632.0 ml  L THIGH (E-G)  ml  L FULL LIMB (A-G)  ml  Limb Volume since initial 11/01/22 DECREASED BY 1.05 % since last measured on 02/20/23; and DECREASED by 5.4%  since  09/02/22  Volume change overall    FOTO functional outcome score:      Lymphedema Life Impact Scale (LLIS):   Date FOTO LLIS  Initial 10/13/22 56%   Final 04/12/23 55%    Initial 10/13/22  36.76%  Final 04/12/23  25% ( 11.76% reduction)    TODAY'S TREATMENT:     RLE/RLQ MLD  RLE multilayer , knee length gradient compression  bandaging Pt/ FAMILY EDU   PATIENT EDUCATION:  Continued Pt/ CG edu for lymphedema self care home program throughout session. Topics include outcome of comparative limb volumetrics- starting vs changing limb volumes over time; technology and gradient techniques used for short stretch, multilayer compression wrapping, simple self-MLD, therapeutic lymphatic pumping exercises, skin/nail care, LE precautions,. compression garment recommendations and specifications, donning and doffing gadgets, wear and care schedule  and compression garment donning / doffing w assistive devices. Discussed progress towards all OT goals since commencing CDT. All questions answered to the Pt's satisfaction w comfort and fit. Good return. Person educated: Patient and Spouse Education method: Explanation, Demonstration, and Handouts Education comprehension: verbalized understanding, returned demonstration, verbal cues required, and needs further education   HOME EXERCISE PROGRAM: Lymphatic Pumping Therex-  1 set of 10, 2 x daily, in order, seated  Daily skin inspection and care with low ph lotion matching skin ph to limit infection risk Simple Self-MLD 1 x daily During Intensive Phase CDT: multilayer  compression wraps from foot to groin using gradient techniques, one limb at a time  to ensure safety During Self -Management Phase CDT: Fit with custom daytime compression garments: Elvarex, custom, flat knit, ccl 2 ( 23-32 mmHg) knee highs. Consider pairing with Compreshorts Capri length pantyhose for containment of buttocks, thighs and abdomen.  Fit with BLE knee length Jobst RELAX w/ open toe and zipper to limit ongoing tissue fibrosis formation and facilitate increased lymphatic function during HOS  Custom-made gradient compression garments and HOS devices are medically necessary in this case because they are uniquely sized and shaped to fit the exact dimensions of the affected extremities, and to provide accurate and consistent gradient compression essential for optimally managing this patient's symptoms of chronic, progressive lymphedema. Multiple custom compression garments are needed for optimal hygiene. Custom compression garments should be replaced q 3-6 months When worn consistently for optimal effectiveness.  ASSESSMENT: Continued MLD to RLE/RLQ today with emphasis on ankle and distal leg fibrosis. Pt denied pain with maual therapy. Reapplied compression wraps as established. We'll fit R custom stocking ASAP.  Cont 2 x weekly as per POC.  OBJECTIVE IMPAIRMENTS: Abnormal gait, decreased activity tolerance, decreased balance, decreased knowledge of condition, decreased knowledge of use of DME, decreased mobility, difficulty walking, decreased ROM, increased edema, impaired sensation, obesity, pain, and chronic , progressive swelling and pain of buttocks, hips, abdomen and legs .   ACTIVITY LIMITATIONS: carrying, lifting, bending, sitting, standing, squatting, sleeping, stairs, transfers, bed mobility, bathing, dressing, hygiene/grooming, caring for others, and leisure pursuits, social participation, productive/ work activities  PARTICIPATION LIMITATIONS: meal prep, cleaning, laundry,  interpersonal relationship, driving, shopping, community activity, occupation, yard work, and church  PERSONAL FACTORS: Age, Past/current experiences, hx of panic attacks and claustrophobia, Time since onset of injury/illness/exacerbation,, motivation, supportive spouse and family are also affecting patient's functional outcome.   REHAB POTENTIAL: Fair Unfortunately the Lipedema does not respond to CDT and is unchanged by this protocol. Fortunately lymphedema  does respond to differing degrees based on the quality and amount of fatty fibrosis present obstructing lymphatics, and patient's tolerance if skin is hypersensitive.   EVALUATION COMPLEXITY: Moderate   GOALS: Goals reviewed with patient? Yes  SHORT TERM GOALS: Target date: 4th OT Rx visit   Pt will demonstrate understanding of lymphedema precautions and prevention strategies with modified independence using a printed reference to identify at least 5 precautions and discussing how s/he may implement them into daily life to reduce risk of progression with modified assistance ( printed reference). Baseline: Max A Goal status: 12/12/22 PROGRESSING 01/11/23 GOAL MET  2.  Pt will be able to apply multilayer, thigh length, compression wraps using gradient techniques with Max caregiver assistance to decrease limb volume, to limit infection risk, and to limit lymphedema progression.  Baseline: Dependent Goal status: 11/10/22 Goal Met  LONG TERM GOALS: Target date: 04/24/23  Given this patient's Intake score of 36.76 %  on the Lymphedema Life Impact Scale (LLIS), patient will experience a reduction of at least 5 points in her perceived level of functional impairment resulting from lymphedema to improve functional performance and quality of life (QOL). Baseline: 36.76% Goal status: 02/20/23 PROGRESSING  2.  Given this patient's Intake score of 56/100% on the functional outcomes FOTO tool, patient will experience an increase in function of 3 points to  improve basic and instrumental ADLs performance, including lymphedema self-care.  Baseline: 56% Goal status: 02/20/23 PROGRESSING  3.  During Intensive phase CDT Pt will achieve at least 85% compliance with all lymphedema self-care home program components, including  daily skin care, multilayer , gradient compression wraps with daily changes, daily simple self MLD and daily lymphatic pumping therex to achieve optimal clinical outcome and to habituate self care regime for optimal LE self-management over time. Baseline: Dependent Goal status: 01/09/23 GOAL MET  4.  Pt will achieve at least a 10% volume reductions bilaterally below the knees to return limb to more typical size and shape, to limit infection risk and LE progression, to decrease pain, to improve function, and to improve body image and QOL. Baseline: Dependent Goal status: 12/12/22 PROGRESSING. 01/11/23 GOAL MET for L LEG  03/28/23 Volumetrics reveal that the R leg is DECREASED in volume BY 1.05 % since last measured on 02/20/23; and DECREASED by 5.4% since  09/02/22.   5.  Pt will be able to don and doff appropriate compression garments and devices using assistive devices and extra time within 1 week of issue date for optimal lymphedema self-care. Baseline: Dependent Goal status: 01/25/23 GOAL MET   PLAN:  PT FREQUENCY: 2x/week  PT DURATION: 12  weeks other: and PRN  PLANNED INTERVENTIONS: Therapeutic exercises, Therapeutic activity, Patient/Family education, Self Care, DME instructions, Manual lymph drainage, Compression bandaging, Manual therapy, and skin care during MLD, fit with appropriate custom compression garments and devices, fit with appropriate compression device  which  follows lymphatic pathways and anatomic distribution  PLAN FOR NEXT SESSION:  MLD Compression Skin care Pt/ family edu for LE self care   Loel Dubonnet, MS, OTR/L, CLT-LANA 04/18/23 4:06 PM

## 2023-04-20 ENCOUNTER — Encounter: Payer: Self-pay | Admitting: Occupational Therapy

## 2023-04-20 ENCOUNTER — Ambulatory Visit: Payer: 59 | Admitting: Occupational Therapy

## 2023-04-20 DIAGNOSIS — I890 Lymphedema, not elsewhere classified: Secondary | ICD-10-CM | POA: Diagnosis not present

## 2023-04-20 NOTE — Therapy (Signed)
OUTPATIENT OCCUPATIONAL THERAPY TREATMENT NOTE LOWER EXTREMITY LYMPHEDEMA  Patient Name: PAULA BUSENBARK MRN: [id number] DOB:[birth year], 62 y.o., female Today's Date: 04/20/2023    END OF SESSION:  OT End of Session - 04/20/23 1501     Visit Number 46    Number of Visits 72    Date for OT Re-Evaluation 04/24/23    OT Start Time 0302    OT Stop Time 0402    OT Time Calculation (min) 60 min    Activity Tolerance Patient tolerated treatment well;No increased pain    Behavior During Therapy WFL for tasks assessed/performed                 Past Medical History:  Diagnosis Date   Anemia    low iron at times   Arthritis    Colon polyps    Complication of anesthesia 1991   Pt reports she awoke during plantar fascia surgery and felt "everything"   Heart murmur    " small per patient"    Hyperlipidemia    Hypertension    Hypothyroidism    Lactose intolerance    PAC (premature atrial contraction)    Palpitations    Panic attacks    Pt reports restrictive clothing/areas and dry mouth cause her to have attack.   Peripheral vascular disease (HCC)    "small" clot after thermal ablasion   Pneumonia 2016   "walking pneumonia" 3 times in 2016   Shortness of breath    going upstairs (due to weight). not so much since weight loss   Sleep apnea    after weight loss does not have to use cpap   Vitamin B12 deficiency    Vitamin D deficiency    Past Surgical History:  Procedure Laterality Date   APPENDECTOMY  1985   CESAREAN SECTION  1987   twins   COLONOSCOPY     COLONOSCOPY WITH PROPOFOL N/A 02/27/2018   Procedure: COLONOSCOPY WITH PROPOFOL;  Surgeon: Toledo, Boykin Nearing, MD;  Location: [facility];  Service: Gastroenterology;  Laterality: N/A;   IRRIGATION AND DEBRIDEMENT HEMATOMA Right 11/12/2015   Procedure: IRRIGATION AND DEBRIDEMENT HEMATOMA;  Surgeon: Earline Mayotte, MD;  Location: [facility];  Service: General;  Laterality: Right;   KNEE ARTHROSCOPY WITH MEDIAL  MENISECTOMY Left 07/15/2020   Procedure: Left knee arthroscopy with partial medial or lateral menisectomy, possible chondroplasty, possible partial synovectomy;  Surgeon: Lyndle Herrlich, MD;  Location: [facility];  Service: Orthopedics;  Laterality: Left;   LAMINECTOMY  07/10/2013   L4   L5   DR Yetta Barre   LAPAROSCOPIC SALPINGO OOPHERECTOMY Right 04/17/2014   Procedure: LAPAROSCOPIC SALPINGO OOPHORECTOMY RIGHT;  Surgeon: Adolphus Birchwood, MD;  Location: [facility];  Service: Gynecology;  Laterality: Right;   LYSIS OF ADHESION  04/17/2014   Procedure: LYSIS OF ADHESION;  Surgeon: Adolphus Birchwood, MD;  Location: [facility];  Service: Gynecology;;   MAXIMUM ACCESS (MAS)POSTERIOR LUMBAR INTERBODY FUSION (PLIF) 1 LEVEL  07/10/2013   Procedure: FOR MAXIMUM ACCESS (MAS) POSTERIOR LUMBAR INTERBODY FUSION (PLIF) LUMBAR FOUR-FIVE;  Surgeon: Tia Alert, MD;  Location: [facility];  Service: Neurosurgery;;  FOR MAXIMUM ACCESS (MAS) POSTERIOR LUMBAR INTERBODY FUSION (PLIF) LUMBAR FOUR-FIVE   OOPHORECTOMY     LSO   OPEN REDUCTION INTERNAL FIXATION (ORIF) DISTAL RADIAL FRACTURE Left 09/20/2018   Procedure: OPEN REDUCTION INTERNAL FIXATION (ORIF) DISTAL RADIAL FRACTURE, LEFT;  Surgeon: Kennedy Bucker, MD;  Location: [facility];  Service: Orthopedics;  Laterality: Left;   PLANTAR FASCIA SURGERY  1999   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   VAGINAL HYSTERECTOMY  2001   for bleeding and LSO for cyst   Patient Active Problem List   Diagnosis Date Noted   Reactive hypoglycemia 02/16/2022   Prediabetes 02/16/2022   Chronic venous insufficiency 12/23/2021   Lymphedema 12/13/2021   Panic attacks    Strain of knee 12/24/2019   Vitamin B12 deficiency 10/08/2019   Arthritis 07/16/2019   Edema 07/16/2019   History of abdominal pain 07/16/2019   Low back pain 07/16/2019   Vitamin D deficiency 07/16/2019   Hypothyroidism 02/28/2019   History of gastric bypass 02/28/2019   Obstructive sleep apnea syndrome 02/28/2019   Insomnia 02/28/2019    Class 3 severe obesity due to excess calories with serious comorbidity and body mass index (BMI) of 40.0 to 44.9 in adult (HCC) 02/28/2019   Elevated BP without diagnosis of hypertension 02/28/2019   Mixed hyperlipidemia 02/28/2019   Hot flashes 02/28/2019   BMI 40.0-44.9, adult (HCC) 12/16/2015   Hematoma of lower extremity 11/12/2015   Essential hypertension 06/20/2014   Pure hypercholesterolemia 06/20/2014   S/P lumbar spinal fusion 07/10/2013   Arthrodesis status 07/10/2013   Palpitations 01/02/2012   Edema of both legs 01/02/2012    PCP: Joni Reining, PA-C  REFERRING PROVIDER: Foster Simpson, DO  REFERRING DIAG: I89.0  THERAPY DIAG:  Lymphedema, not elsewhere classified  Rationale for Evaluation and Treatment: Rehabilitation  ONSET DATE: Insidious onset with progression over time. Greater than 20 years  SUBJECTIVE:                                                                                                                                                                                          SUBJECTIVE STATEMENT:Kristyn B Allum presents to OT visit to address BLE lipo-lymphedema. Pt is accompanied by her supportive spouse, [name]. Pt denies LE-related pain in her legs. Pt has no new concerns.  PERTINENT HISTORY and contributing factors include, OA, HTN, Panic attacks 2/2 restrictive clothing, OSA (not using CPAP), s/p hysterectomy 1987, L knee sx, oophorectomy, s/p hysterectomy 2001, Gastric Bypass 2007, s/p plantar fascia sx 1999, obesity, insomnia  PAIN:  Are you having LE-related pain? No: NPRS scale: 0/10.  Pain location: BLE- generalized, knees Pain description: discomfort, heavy, tired, full, sore Aggravating factors: standing, walking, extended dependent sitting Relieving factors: elevation  PRECAUTIONS: Other: LYMPHEDEMA PRECAUTIONS: HYPOTHYROID, DIABETES SKIN PRECAUTIONS  WEIGHT BEARING RESTRICTIONS: No  FALLS:  Has patient fallen since last visit?  04/05/23 YES Pt had a  fall in the middle of the night. She states she got up to go to the bathroom and when rounding the  bottom of the bed she passed out and woke up on the floor. Pt was able to get herself up . She is sore today, but uninjured by report. She was wearing the portable heart monitor at the time.   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  BLE COMPARATIVE LIMB VOLUMETRICS:  INTAKE: 11/01/22  LANDMARK RIGHT    R LEG (A-D) 5947.2 ml  R THIGH (E-G) 9750.0 ml  R FULL LIMB (A-G) 15697.2 ml  Limb Volume differential (LVD)  %  Volume change since initial %  Volume change overall V  (Blank rows = not tested)  LANDMARK LEFT   L LEG (A-D) 6011.6 ml  L THIGH (E-G) 9602.5 ml  L FULL LIMB (A-G) 15614.1 ml  Limb Volume differential (LVD)  LEG LVD = 1.8%, L>R % THIGH LVD = 1.5%, R>L FULL LIMB LVD = 0.53%, R>L  Volume change since initial %  Volume change overall %  (Blank rows = not tested)   9th VISIT 11/24/22  LANDMARK LEFT   L LEG (A-D) 5918.6 ml  L THIGH (E-G) 9265.5 ml  L FULL LIMB (A-G) 15184.1  ml  Limb Volume differential (LVD)  LEG volume = Decreased 1.5%;  THIGH volume decreased 3.5 %, AND  L FULL LIMB VOLUME dec BY 2.8 % SINCE 11/01/22  Volume change since initial %  Volume change overall %    20 th VISIT 01/11/23  LANDMARK LEFT   L LEG (A-D) 5181.2 ml  L THIGH (E-G)  ml  L FULL LIMB (A-G)  ml  Limb Volume differential (LVD)  L LEG (A-D) volume = Decreased 12.4% since 11/24/22. 10% GOAL MET. Did not measure L thigh or full limb today since we have not been bandaging above the knee.   Volume change since initial L LEG (A-D) volume reduction since  commencing CDT on 09/02/22 measures 13.8% 10% GOAL MET.   30 th VISIT 01/11/23  LANDMARK RLE  L LEG (A-D) 5691.5  ml  L THIGH (E-G)  ml  L FULL LIMB (A-G)  ml  Limb Volume since initial 11/01/22 DECREASED BY 4.3%  Volume change overall    39 th VISIT 03/28/23  LANDMARK RLE  L LEG (A-D) 5632.0 ml  L THIGH (E-G)  ml  L FULL LIMB (A-G)  ml  Limb Volume since initial 11/01/22 DECREASED BY 1.05 % since last measured on 02/20/23; and DECREASED by 5.4%  since  09/02/22  Volume change overall    FOTO functional outcome score:      Lymphedema Life Impact Scale (LLIS):   Date FOTO LLIS  Initial 10/13/22 56%   Final 04/12/23 55%    Initial 10/13/22  36.76%  Final 04/12/23  25% ( 11.76% reduction)    TODAY'S TREATMENT:     RLE/RLQ MLD  RLE multilayer , knee length gradient compression  bandaging Pt/ FAMILY EDU   PATIENT EDUCATION:  Continued Pt/ CG edu for lymphedema self care home program throughout session. Topics include outcome of comparative limb volumetrics- starting vs changing limb volumes over time; technology and gradient techniques used for short stretch, multilayer compression wrapping, simple self-MLD, therapeutic lymphatic pumping exercises, skin/nail care, LE precautions,. compression garment recommendations  and specifications, donning and doffing gadgets, wear and care schedule and compression garment donning / doffing w assistive devices. Discussed progress towards all OT goals since commencing CDT. All questions answered to the Pt's satisfaction w comfort and fit. Good return. Person educated: Patient and Spouse Education method: Explanation, Demonstration, and Handouts Education comprehension: verbalized understanding, returned demonstration, verbal cues required, and needs further education   HOME EXERCISE PROGRAM: Lymphatic Pumping Therex-  1 set of 10, 2 x daily, in order, seated  Daily skin inspection and care with low ph lotion matching skin ph to limit infection  risk Simple Self-MLD 1 x daily During Intensive Phase CDT: multilayer compression wraps from foot to groin using gradient techniques, one limb at a time  to ensure safety During Self -Management Phase CDT: Fit with custom daytime compression garments: Elvarex, custom, flat knit, ccl 2 ( 23-32 mmHg) knee highs. Consider pairing with Compreshorts Capri length pantyhose for containment of buttocks, thighs and abdomen.  Fit with BLE knee length Jobst RELAX w/ open toe and zipper to limit ongoing tissue fibrosis formation and facilitate increased lymphatic function during HOS  Custom-made gradient compression garments and HOS devices are medically necessary in this case because they are uniquely sized and shaped to fit the exact dimensions of the affected extremities, and to provide accurate and consistent gradient compression essential for optimally managing this patient's symptoms of chronic, progressive lymphedema. Multiple custom compression garments are needed for optimal hygiene. Custom compression garments should be replaced q 3-6 months When worn consistently for optimal effectiveness.  ASSESSMENT: Continued MLD to RLE/RLQ today with emphasis on ankle and distal leg fibrosis. Pt denied pain with maual therapy. Reapplied compression wraps as established. We'll fit R custom stocking ASAP.  Cont 2 x weekly as per POC.  OBJECTIVE IMPAIRMENTS: Abnormal gait, decreased activity tolerance, decreased balance, decreased knowledge of condition, decreased knowledge of use of DME, decreased mobility, difficulty walking, decreased ROM, increased edema, impaired sensation, obesity, pain, and chronic , progressive swelling and pain of buttocks, hips, abdomen and legs .   ACTIVITY LIMITATIONS: carrying, lifting, bending, sitting, standing, squatting, sleeping, stairs, transfers, bed mobility, bathing, dressing, hygiene/grooming, caring for others, and leisure pursuits, social participation, productive/ work  activities  PARTICIPATION LIMITATIONS: meal prep, cleaning, laundry, interpersonal relationship, driving, shopping, community activity, occupation, yard work, and church  PERSONAL FACTORS: Age, Past/current experiences, hx of panic attacks and claustrophobia, Time since onset of injury/illness/exacerbation,, motivation, supportive spouse and family are also affecting patient's functional outcome.   REHAB POTENTIAL: Fair Unfortunately the Lipedema does not respond to CDT and is unchanged by this protocol. Fortunately lymphedema  does respond to differing degrees based on the quality and amount of fatty fibrosis present obstructing lymphatics, and patient's tolerance if skin is hypersensitive.   EVALUATION COMPLEXITY: Moderate   GOALS: Goals reviewed with patient? Yes  SHORT TERM GOALS: Target date: 4th OT Rx visit   Pt will demonstrate understanding of lymphedema precautions and prevention strategies with modified independence using a printed reference to identify at least 5 precautions and discussing how s/he may implement them into daily life to reduce risk of progression with modified assistance ( printed reference). Baseline: Max A Goal status: 12/12/22 PROGRESSING 01/11/23 GOAL MET  2.  Pt will be able to apply multilayer, thigh length, compression wraps using gradient techniques with Max caregiver assistance to decrease limb volume, to limit infection risk, and to limit lymphedema progression.  Baseline: Dependent Goal status: 11/10/22 Goal Met  LONG TERM GOALS: Target date:  04/24/23  Given this patient's Intake score of 36.76 % on the Lymphedema Life Impact Scale (LLIS), patient will experience a reduction of at least 5 points in her perceived level of functional impairment resulting from lymphedema to improve functional performance and quality of life (QOL). Baseline: 36.76% Goal status: 02/20/23 PROGRESSING  2.  Given this patient's Intake score of 56/100% on the functional outcomes  FOTO tool, patient will experience an increase in function of 3 points to improve basic and instrumental ADLs performance, including lymphedema self-care.  Baseline: 56% Goal status: 02/20/23 PROGRESSING  3.  During Intensive phase CDT Pt will achieve at least 85% compliance with all lymphedema self-care home program components, including  daily skin care, multilayer , gradient compression wraps with daily changes, daily simple self MLD and daily lymphatic pumping therex to achieve optimal clinical outcome and to habituate self care regime for optimal LE self-management over time. Baseline: Dependent Goal status: 01/09/23 GOAL MET  4.  Pt will achieve at least a 10% volume reductions bilaterally below the knees to return limb to more typical size and shape, to limit infection risk and LE progression, to decrease pain, to improve function, and to improve body image and QOL. Baseline: Dependent Goal status: 12/12/22 PROGRESSING. 01/11/23 GOAL MET for L LEG  03/28/23 Volumetrics reveal that the R leg is DECREASED in volume BY 1.05 % since last measured on 02/20/23; and DECREASED by 5.4% since  09/02/22.   5.  Pt will be able to don and doff appropriate compression garments and devices using assistive devices and extra time within 1 week of issue date for optimal lymphedema self-care. Baseline: Dependent Goal status: 01/25/23 GOAL MET   PLAN:  PT FREQUENCY: 2x/week  PT DURATION: 12  weeks other: and PRN  PLANNED INTERVENTIONS: Therapeutic exercises, Therapeutic activity, Patient/Family education, Self Care, DME instructions, Manual lymph drainage, Compression bandaging, Manual therapy, and skin care during MLD, fit with appropriate custom compression garments and devices, fit with appropriate compression device  which  follows lymphatic pathways and anatomic distribution  PLAN FOR NEXT SESSION:  MLD Compression Skin care Pt/ family edu for LE self care   Loel Dubonnet, MS, OTR/L,  CLT-LANA 04/20/23 4:03 PM

## 2023-04-21 ENCOUNTER — Encounter: Payer: 59 | Admitting: Occupational Therapy

## 2023-04-25 ENCOUNTER — Ambulatory Visit: Payer: 59 | Admitting: Occupational Therapy

## 2023-04-25 DIAGNOSIS — I890 Lymphedema, not elsewhere classified: Secondary | ICD-10-CM | POA: Diagnosis not present

## 2023-04-25 NOTE — Therapy (Signed)
OUTPATIENT OCCUPATIONAL THERAPY TREATMENT NOTE LOWER EXTREMITY LYMPHEDEMA  Patient Name: Mary [name] MRN: [id number] DOB:07-23-1960, 62 y.o., female Today's Date: 04/25/2023    END OF SESSION:  OT End of Session - 04/25/23 1604     Visit Number 47    Number of Visits 72    Date for OT Re-Evaluation 04/24/23    OT Start Time 0305    OT Stop Time 0403    OT Time Calculation (min) 58 min    Activity Tolerance Patient tolerated treatment well;No increased pain    Behavior During Therapy WFL for tasks assessed/performed                 Past Medical History:  Diagnosis Date   Anemia    low iron at times   Arthritis    Colon polyps    Complication of anesthesia 1991   Pt reports she awoke during plantar fascia surgery and felt "everything"   Heart murmur    " small per patient"    Hyperlipidemia    Hypertension    Hypothyroidism    Lactose intolerance    PAC (premature atrial contraction)    Palpitations    Panic attacks    Pt reports restrictive clothing/areas and dry mouth cause her to have attack.   Peripheral vascular disease (HCC)    "small" clot after thermal ablasion   Pneumonia 2016   "walking pneumonia" 3 times in 2016   Shortness of breath    going upstairs (due to weight). not so much since weight loss   Sleep apnea    after weight loss does not have to use cpap   Vitamin B12 deficiency    Vitamin D deficiency    Past Surgical History:  Procedure Laterality Date   APPENDECTOMY  1985   CESAREAN SECTION  1987   twins   COLONOSCOPY     COLONOSCOPY WITH PROPOFOL N/A 02/27/2018   Procedure: COLONOSCOPY WITH PROPOFOL;  Surgeon: Toledo, Boykin Nearing, MD;  Location: [facility];  Service: Gastroenterology;  Laterality: N/A;   IRRIGATION AND DEBRIDEMENT HEMATOMA Right 11/12/2015   Procedure: IRRIGATION AND DEBRIDEMENT HEMATOMA;  Surgeon: Earline Mayotte, MD;  Location: [facility];  Service: General;  Laterality: Right;   KNEE ARTHROSCOPY WITH MEDIAL  MENISECTOMY Left 07/15/2020   Procedure: Left knee arthroscopy with partial medial or lateral menisectomy, possible chondroplasty, possible partial synovectomy;  Surgeon: Lyndle Herrlich, MD;  Location: [facility];  Service: Orthopedics;  Laterality: Left;   LAMINECTOMY  07/10/2013   L4   L5   DR Yetta Barre   LAPAROSCOPIC SALPINGO OOPHERECTOMY Right 04/17/2014   Procedure: LAPAROSCOPIC SALPINGO OOPHORECTOMY RIGHT;  Surgeon: Adolphus Birchwood, MD;  Location: [facility];  Service: Gynecology;  Laterality: Right;   LYSIS OF ADHESION  04/17/2014   Procedure: LYSIS OF ADHESION;  Surgeon: Adolphus Birchwood, MD;  Location: [facility];  Service: Gynecology;;   MAXIMUM ACCESS (MAS)POSTERIOR LUMBAR INTERBODY FUSION (PLIF) 1 LEVEL  07/10/2013   Procedure: FOR MAXIMUM ACCESS (MAS) POSTERIOR LUMBAR INTERBODY FUSION (PLIF) LUMBAR FOUR-FIVE;  Surgeon: Tia Alert, MD;  Location: [facility];  Service: Neurosurgery;;  FOR MAXIMUM ACCESS (MAS) POSTERIOR LUMBAR INTERBODY FUSION (PLIF) LUMBAR FOUR-FIVE   OOPHORECTOMY     LSO   OPEN REDUCTION INTERNAL FIXATION (ORIF) DISTAL RADIAL FRACTURE Left 09/20/2018   Procedure: OPEN REDUCTION INTERNAL FIXATION (ORIF) DISTAL RADIAL FRACTURE, LEFT;  Surgeon: Kennedy Bucker, MD;  Location: [facility];  Service: Orthopedics;  Laterality: Left;   PLANTAR FASCIA SURGERY  1999   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   VAGINAL HYSTERECTOMY  2001   for bleeding and LSO for cyst   Patient Active Problem List   Diagnosis Date Noted   Reactive hypoglycemia 02/16/2022   Prediabetes 02/16/2022   Chronic venous insufficiency 12/23/2021   Lymphedema 12/13/2021   Panic attacks    Strain of knee 12/24/2019   Vitamin B12 deficiency 10/08/2019   Arthritis 07/16/2019   Edema 07/16/2019   History of abdominal pain 07/16/2019   Low back pain 07/16/2019   Vitamin D deficiency 07/16/2019   Hypothyroidism 02/28/2019   History of gastric bypass 02/28/2019   Obstructive sleep apnea syndrome 02/28/2019   Insomnia 02/28/2019    Class 3 severe obesity due to excess calories with serious comorbidity and body mass index (BMI) of 40.0 to 44.9 in adult (HCC) 02/28/2019   Elevated BP without diagnosis of hypertension 02/28/2019   Mixed hyperlipidemia 02/28/2019   Hot flashes 02/28/2019   BMI 40.0-44.9, adult (HCC) 12/16/2015   Hematoma of lower extremity 11/12/2015   Essential hypertension 06/20/2014   Pure hypercholesterolemia 06/20/2014   S/P lumbar spinal fusion 07/10/2013   Arthrodesis status 07/10/2013   Palpitations 01/02/2012   Edema of both legs 01/02/2012    PCP: Joni Reining, PA-C  REFERRING PROVIDER: Foster Simpson, DO  REFERRING DIAG: I89.0  THERAPY DIAG:  Lymphedema, not elsewhere classified  Rationale for Evaluation and Treatment: Rehabilitation  ONSET DATE: Insidious onset with progression over time. Greater than 20 years  SUBJECTIVE:                                                                                                                                                                                          SUBJECTIVE STATEMENT:Mary [name] presents to OT visit to address BLE lipo-lymphedema. Pt is accompanied by her supportive spouse, [name]. Pt denies LE-related pain in her legs. Pt has no new concerns.We discussed Pt calling DME vendor to check status on her custom compression garments.  PERTINENT HISTORY and contributing factors include, OA, HTN, Panic attacks 2/2 restrictive clothing, OSA (not using CPAP), s/p hysterectomy 1987, L knee sx, oophorectomy, s/p hysterectomy 2001, Gastric Bypass 2007, s/p plantar fascia sx 1999, obesity, insomnia  PAIN:  Are you having LE-related pain? No: NPRS scale: 0/10.  Pain location: BLE- generalized, knees Pain description: discomfort, heavy, tired, full, sore Aggravating factors: standing, walking, extended dependent sitting Relieving factors: elevation  PRECAUTIONS: Other: LYMPHEDEMA PRECAUTIONS: HYPOTHYROID, DIABETES SKIN  PRECAUTIONS  WEIGHT BEARING RESTRICTIONS: No  FALLS:  Has patient fallen since last visit? 04/05/23 YES Pt had a  fall in the middle of the night. She  states she got up to go to the bathroom and when rounding the bottom of the bed she passed out and woke up on the floor. Pt was able to get herself up . She is sore today, but uninjured by report. She was wearing the portable heart monitor at the time.   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  BLE COMPARATIVE LIMB VOLUMETRICS:  INTAKE: 11/01/22  LANDMARK RIGHT    R LEG (A-D) 5947.2 ml  R THIGH (E-G) 9750.0 ml  R FULL LIMB (A-G) 15697.2 ml  Limb Volume differential (LVD)  %  Volume change since initial %  Volume change overall V  (Blank rows = not tested)  LANDMARK LEFT   L LEG (A-D) 6011.6 ml  L THIGH (E-G) 9602.5 ml  L FULL LIMB (A-G) 15614.1 ml  Limb Volume differential (LVD)  LEG LVD = 1.8%, L>R % THIGH LVD = 1.5%, R>L FULL LIMB LVD = 0.53%, R>L  Volume change since initial %  Volume change overall %  (Blank rows = not tested)   9th VISIT 11/24/22  LANDMARK LEFT   L LEG (A-D) 5918.6 ml  L THIGH (E-G) 9265.5 ml  L FULL LIMB (A-G) 15184.1  ml  Limb Volume differential (LVD)  LEG volume = Decreased 1.5%;  THIGH volume decreased 3.5 %, AND  L FULL LIMB VOLUME dec BY 2.8 % SINCE 11/01/22  Volume change since initial %  Volume change overall %    20 th VISIT 01/11/23  LANDMARK LEFT   L LEG (A-D) 5181.2 ml  L THIGH (E-G)  ml  L FULL LIMB (A-G)  ml  Limb Volume differential (LVD)  L LEG (A-D) volume = Decreased 12.4% since 11/24/22. 10% GOAL MET. Did not measure L thigh or full limb today since we have not  been bandaging above the knee.   Volume change since initial L LEG (A-D) volume reduction since commencing CDT on 09/02/22 measures 13.8% 10% GOAL MET.   30 th VISIT 01/11/23  LANDMARK RLE  L LEG (A-D) 5691.5  ml  L THIGH (E-G)  ml  L FULL LIMB (A-G)  ml  Limb Volume since initial 11/01/22 DECREASED BY 4.3%  Volume change overall    39 th VISIT 03/28/23  LANDMARK RLE  L LEG (A-D) 5632.0 ml  L THIGH (E-G)  ml  L FULL LIMB (A-G)  ml  Limb Volume since initial 11/01/22 DECREASED BY 1.05 % since last measured on 02/20/23; and DECREASED by 5.4%  since  09/02/22  Volume change overall    FOTO functional outcome score:      Lymphedema Life Impact Scale (LLIS):   Date FOTO LLIS  Initial 10/13/22 56%   Final 04/12/23 55%    Initial 10/13/22  36.76%  Final 04/12/23  25% ( 11.76% reduction)    TODAY'S TREATMENT:     RLE/RLQ MLD  RLE multilayer , knee length gradient compression  bandaging Pt/ FAMILY EDU   PATIENT EDUCATION:  Continued Pt/ CG edu for lymphedema self care home program throughout session. Topics include outcome of comparative limb volumetrics- starting vs changing limb volumes over time; technology and gradient techniques used for short stretch, multilayer compression wrapping,  simple self-MLD, therapeutic lymphatic pumping exercises, skin/nail care, LE precautions,. compression garment recommendations and specifications, donning and doffing gadgets, wear and care schedule and compression garment donning / doffing w assistive devices. Discussed progress towards all OT goals since commencing CDT. All questions answered to the Pt's satisfaction w comfort and fit. Good return. Person educated: Patient and Spouse Education method: Explanation, Demonstration, and Handouts Education comprehension: verbalized understanding, returned demonstration, verbal cues required, and needs further education   HOME EXERCISE PROGRAM: Lymphatic Pumping Therex-  1 set of 10, 2 x daily, in order, seated   Daily skin inspection and care with low ph lotion matching skin ph to limit infection risk Simple Self-MLD 1 x daily During Intensive Phase CDT: multilayer compression wraps from foot to groin using gradient techniques, one limb at a time  to ensure safety During Self -Management Phase CDT: Fit with custom daytime compression garments: Elvarex, custom, flat knit, ccl 2 ( 23-32 mmHg) knee highs. Consider pairing with Compreshorts Capri length pantyhose for containment of buttocks, thighs and abdomen.  Fit with BLE knee length Jobst RELAX w/ open toe and zipper to limit ongoing tissue fibrosis formation and facilitate increased lymphatic function during HOS  Custom-made gradient compression garments and HOS devices are medically necessary in this case because they are uniquely sized and shaped to fit the exact dimensions of the affected extremities, and to provide accurate and consistent gradient compression essential for optimally managing this patient's symptoms of chronic, progressive lymphedema. Multiple custom compression garments are needed for optimal hygiene. Custom compression garments should be replaced q 3-6 months When worn consistently for optimal effectiveness.  ASSESSMENT: Continued MLD to RLE/RLQ today with emphasis on ankle and distal leg fibrosis. Pt denied pain with maual therapy. Reapplied compression wraps as established. We'll fit R custom stocking ASAP.  Cont 2 x weekly as per POC.  OBJECTIVE IMPAIRMENTS: Abnormal gait, decreased activity tolerance, decreased balance, decreased knowledge of condition, decreased knowledge of use of DME, decreased mobility, difficulty walking, decreased ROM, increased edema, impaired sensation, obesity, pain, and chronic , progressive swelling and pain of buttocks, hips, abdomen and legs .   ACTIVITY LIMITATIONS: carrying, lifting, bending, sitting, standing, squatting, sleeping, stairs, transfers, bed mobility, bathing, dressing, hygiene/grooming,  caring for others, and leisure pursuits, social participation, productive/ work activities  PARTICIPATION LIMITATIONS: meal prep, cleaning, laundry, interpersonal relationship, driving, shopping, community activity, occupation, yard work, and church  PERSONAL FACTORS: Age, Past/current experiences, hx of panic attacks and claustrophobia, Time since onset of injury/illness/exacerbation,, motivation, supportive spouse and family are also affecting patient's functional outcome.   REHAB POTENTIAL: Fair Unfortunately the Lipedema does not respond to CDT and is unchanged by this protocol. Fortunately lymphedema  does respond to differing degrees based on the quality and amount of fatty fibrosis present obstructing lymphatics, and patient's tolerance if skin is hypersensitive.   EVALUATION COMPLEXITY: Moderate   GOALS: Goals reviewed with patient? Yes  SHORT TERM GOALS: Target date: 4th OT Rx visit   Pt will demonstrate understanding of lymphedema precautions and prevention strategies with modified independence using a printed reference to identify at least 5 precautions and discussing how s/he may implement them into daily life to reduce risk of progression with modified assistance ( printed reference). Baseline: Max A Goal status: 12/12/22 PROGRESSING 01/11/23 GOAL MET  2.  Pt will be able to apply multilayer, thigh length, compression wraps using gradient techniques with Max caregiver assistance to decrease limb volume, to limit infection risk, and to limit lymphedema progression.  Baseline: Dependent Goal status: 11/10/22 Goal Met  LONG TERM GOALS: Target date: 04/24/23  Given this patient's Intake score of 36.76 % on the Lymphedema Life Impact Scale (LLIS), patient will experience a reduction of at least 5 points in her perceived level of functional impairment resulting from lymphedema to improve functional performance and quality of life (QOL). Baseline: 36.76% Goal status: 02/20/23  PROGRESSING  2.  Given this patient's Intake score of 56/100% on the functional outcomes FOTO tool, patient will experience an increase in function of 3 points to improve basic and instrumental ADLs performance, including lymphedema self-care.  Baseline: 56% Goal status: 02/20/23 PROGRESSING  3.  During Intensive phase CDT Pt will achieve at least 85% compliance with all lymphedema self-care home program components, including  daily skin care, multilayer , gradient compression wraps with daily changes, daily simple self MLD and daily lymphatic pumping therex to achieve optimal clinical outcome and to habituate self care regime for optimal LE self-management over time. Baseline: Dependent Goal status: 01/09/23 GOAL MET  4.  Pt will achieve at least a 10% volume reductions bilaterally below the knees to return limb to more typical size and shape, to limit infection risk and LE progression, to decrease pain, to improve function, and to improve body image and QOL. Baseline: Dependent Goal status: 12/12/22 PROGRESSING. 01/11/23 GOAL MET for L LEG  03/28/23 Volumetrics reveal that the R leg is DECREASED in volume BY 1.05 % since last measured on 02/20/23; and DECREASED by 5.4% since  09/02/22.   5.  Pt will be able to don and doff appropriate compression garments and devices using assistive devices and extra time within 1 week of issue date for optimal lymphedema self-care. Baseline: Dependent Goal status: 01/25/23 GOAL MET   PLAN:  PT FREQUENCY: 2x/week  PT DURATION: 12  weeks other: and PRN  PLANNED INTERVENTIONS: Therapeutic exercises, Therapeutic activity, Patient/Family education, Self Care, DME instructions, Manual lymph drainage, Compression bandaging, Manual therapy, and skin care during MLD, fit with appropriate custom compression garments and devices, fit with appropriate compression device  which  follows lymphatic pathways and anatomic distribution  PLAN FOR NEXT SESSION:   MLD Compression Skin care Pt/ family edu for LE self care   Loel Dubonnet, MS, OTR/L, CLT-LANA 04/25/23 4:06 PM

## 2023-04-27 ENCOUNTER — Ambulatory Visit: Payer: 59 | Admitting: Occupational Therapy

## 2023-04-27 ENCOUNTER — Encounter: Payer: Self-pay | Admitting: Occupational Therapy

## 2023-04-27 DIAGNOSIS — I890 Lymphedema, not elsewhere classified: Secondary | ICD-10-CM | POA: Diagnosis not present

## 2023-04-27 NOTE — Therapy (Signed)
OUTPATIENT OCCUPATIONAL THERAPY TREATMENT NOTE LOWER EXTREMITY LYMPHEDEMA  Patient Name: Mary [name] MRN: [id number] DOB:[birth year], 62 y.o., female Today's Date: 04/27/2023    END OF SESSION:  OT End of Session - 04/27/23 1514     Visit Number 48    Number of Visits 72    Date for OT Re-Evaluation 07/26/23    OT Start Time 0305    OT Stop Time 0400    OT Time Calculation (min) 55 min    Activity Tolerance Patient tolerated treatment well;No increased pain    Behavior During Therapy WFL for tasks assessed/performed                 Past Medical History:  Diagnosis Date   Anemia    low iron at times   Arthritis    Colon polyps    Complication of anesthesia 1991   Pt reports she awoke during plantar fascia surgery and felt "everything"   Heart murmur    " small per patient"    Hyperlipidemia    Hypertension    Hypothyroidism    Lactose intolerance    PAC (premature atrial contraction)    Palpitations    Panic attacks    Pt reports restrictive clothing/areas and dry mouth cause her to have attack.   Peripheral vascular disease (HCC)    "small" clot after thermal ablasion   Pneumonia 2016   "walking pneumonia" 3 times in 2016   Shortness of breath    going upstairs (due to weight). not so much since weight loss   Sleep apnea    after weight loss does not have to use cpap   Vitamin B12 deficiency    Vitamin D deficiency    Past Surgical History:  Procedure Laterality Date   APPENDECTOMY  1985   CESAREAN SECTION  1987   twins   COLONOSCOPY     COLONOSCOPY WITH PROPOFOL N/A 02/27/2018   Procedure: COLONOSCOPY WITH PROPOFOL;  Surgeon: Toledo, Boykin Nearing, MD;  Location: [facility];  Service: Gastroenterology;  Laterality: N/A;   IRRIGATION AND DEBRIDEMENT HEMATOMA Right 11/12/2015   Procedure: IRRIGATION AND DEBRIDEMENT HEMATOMA;  Surgeon: Earline Mayotte, MD;  Location: [facility];  Service: General;  Laterality: Right;   KNEE ARTHROSCOPY WITH MEDIAL  MENISECTOMY Left 07/15/2020   Procedure: Left knee arthroscopy with partial medial or lateral menisectomy, possible chondroplasty, possible partial synovectomy;  Surgeon: Lyndle Herrlich, MD;  Location: [facility];  Service: Orthopedics;  Laterality: Left;   LAMINECTOMY  07/10/2013   L4   L5   DR Yetta Barre   LAPAROSCOPIC SALPINGO OOPHERECTOMY Right 04/17/2014   Procedure: LAPAROSCOPIC SALPINGO OOPHORECTOMY RIGHT;  Surgeon: Adolphus Birchwood, MD;  Location: [facility];  Service: Gynecology;  Laterality: Right;   LYSIS OF ADHESION  04/17/2014   Procedure: LYSIS OF ADHESION;  Surgeon: Adolphus Birchwood, MD;  Location: [facility];  Service: Gynecology;;   MAXIMUM ACCESS (MAS)POSTERIOR LUMBAR INTERBODY FUSION (PLIF) 1 LEVEL  07/10/2013   Procedure: FOR MAXIMUM ACCESS (MAS) POSTERIOR LUMBAR INTERBODY FUSION (PLIF) LUMBAR FOUR-FIVE;  Surgeon: Tia Alert, MD;  Location: [facility];  Service: Neurosurgery;;  FOR MAXIMUM ACCESS (MAS) POSTERIOR LUMBAR INTERBODY FUSION (PLIF) LUMBAR FOUR-FIVE   OOPHORECTOMY     LSO   OPEN REDUCTION INTERNAL FIXATION (ORIF) DISTAL RADIAL FRACTURE Left 09/20/2018   Procedure: OPEN REDUCTION INTERNAL FIXATION (ORIF) DISTAL RADIAL FRACTURE, LEFT;  Surgeon: Kennedy Bucker, MD;  Location: [facility];  Service: Orthopedics;  Laterality: Left;   PLANTAR FASCIA SURGERY  1999   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   VAGINAL HYSTERECTOMY  2001   for bleeding and LSO for cyst   Patient Active Problem List   Diagnosis Date Noted   Reactive hypoglycemia 02/16/2022   Prediabetes 02/16/2022   Chronic venous insufficiency 12/23/2021   Lymphedema 12/13/2021   Panic attacks    Strain of knee 12/24/2019   Vitamin B12 deficiency 10/08/2019   Arthritis 07/16/2019   Edema 07/16/2019   History of abdominal pain 07/16/2019   Low back pain 07/16/2019   Vitamin D deficiency 07/16/2019   Hypothyroidism 02/28/2019   History of gastric bypass 02/28/2019   Obstructive sleep apnea syndrome 02/28/2019   Insomnia 02/28/2019    Class 3 severe obesity due to excess calories with serious comorbidity and body mass index (BMI) of 40.0 to 44.9 in adult (HCC) 02/28/2019   Elevated BP without diagnosis of hypertension 02/28/2019   Mixed hyperlipidemia 02/28/2019   Hot flashes 02/28/2019   BMI 40.0-44.9, adult (HCC) 12/16/2015   Hematoma of lower extremity 11/12/2015   Essential hypertension 06/20/2014   Pure hypercholesterolemia 06/20/2014   S/P lumbar spinal fusion 07/10/2013   Arthrodesis status 07/10/2013   Palpitations 01/02/2012   Edema of both legs 01/02/2012    PCP: Joni Reining, PA-C  REFERRING PROVIDER: Foster Simpson, DO  REFERRING DIAG: I89.0  THERAPY DIAG:  Lymphedema, not elsewhere classified  Rationale for Evaluation and Treatment: Rehabilitation  ONSET DATE: Insidious onset with progression over time. Greater than 20 years  SUBJECTIVE:                                                                                                                                                                                          SUBJECTIVE STATEMENT:Mary [name] presents to OT visit to address BLE lipo-lymphedema. Pt is accompanied by her supportive spouse, [name]. Pt denies LE-related pain in her legs. Pt has no new concerns.We discussed Pt calling DME vendor to check status on her custom compression garments.  PERTINENT HISTORY and contributing factors include, OA, HTN, Panic attacks 2/2 restrictive clothing, OSA (not using CPAP), s/p hysterectomy 1987, L knee sx, oophorectomy, s/p hysterectomy 2001, Gastric Bypass 2007, s/p plantar fascia sx 1999, obesity, insomnia  PAIN:  Are you having LE-related pain? No: NPRS scale: 0/10.  Pain location: BLE- generalized, knees Pain description: discomfort, heavy, tired, full, sore Aggravating factors: standing, walking, extended dependent sitting Relieving factors: elevation  PRECAUTIONS: Other: LYMPHEDEMA PRECAUTIONS: HYPOTHYROID, DIABETES SKIN  PRECAUTIONS  WEIGHT BEARING RESTRICTIONS: No  FALLS:  Has patient fallen since last visit? 04/05/23 YES Pt had a  fall in the middle of the night. She  states she got up to go to the bathroom and when rounding the bottom of the bed she passed out and woke up on the floor. Pt was able to get herself up . She is sore today, but uninjured by report. She was wearing the portable heart monitor at the time.   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  BLE COMPARATIVE LIMB VOLUMETRICS:  INTAKE: 11/01/22  LANDMARK RIGHT    R LEG (A-D) 5947.2 ml  R THIGH (E-G) 9750.0 ml  R FULL LIMB (A-G) 15697.2 ml  Limb Volume differential (LVD)  %  Volume change since initial %  Volume change overall V  (Blank rows = not tested)  LANDMARK LEFT   L LEG (A-D) 6011.6 ml  L THIGH (E-G) 9602.5 ml  L FULL LIMB (A-G) 15614.1 ml  Limb Volume differential (LVD)  LEG LVD = 1.8%, L>R % THIGH LVD = 1.5%, R>L FULL LIMB LVD = 0.53%, R>L  Volume change since initial %  Volume change overall %  (Blank rows = not tested)   9th VISIT 11/24/22  LANDMARK LEFT   L LEG (A-D) 5918.6 ml  L THIGH (E-G) 9265.5 ml  L FULL LIMB (A-G) 15184.1  ml  Limb Volume differential (LVD)  LEG volume = Decreased 1.5%;  THIGH volume decreased 3.5 %, AND  L FULL LIMB VOLUME dec BY 2.8 % SINCE 11/01/22  Volume change since initial %  Volume change overall %    20 th VISIT 01/11/23  LANDMARK LEFT   L LEG (A-D) 5181.2 ml  L THIGH (E-G)  ml  L FULL LIMB (A-G)  ml  Limb Volume differential (LVD)  L LEG (A-D) volume = Decreased 12.4% since 11/24/22. 10% GOAL MET. Did not measure L thigh or full limb today since we have not  been bandaging above the knee.   Volume change since initial L LEG (A-D) volume reduction since commencing CDT on 09/02/22 measures 13.8% 10% GOAL MET.   30 th VISIT 01/11/23  LANDMARK RLE  L LEG (A-D) 5691.5  ml  L THIGH (E-G)  ml  L FULL LIMB (A-G)  ml  Limb Volume since initial 11/01/22 DECREASED BY 4.3%  Volume change overall    39 th VISIT 03/28/23  LANDMARK RLE  L LEG (A-D) 5632.0 ml  L THIGH (E-G)  ml  L FULL LIMB (A-G)  ml  Limb Volume since initial 11/01/22 DECREASED BY 1.05 % since last measured on 02/20/23; and DECREASED by 5.4%  since  09/02/22  Volume change overall    FOTO functional outcome score:      Lymphedema Life Impact Scale (LLIS):   Date FOTO LLIS  Initial 10/13/22 56%   Final 04/12/23 55%    Initial 10/13/22  36.76%  Final 04/12/23  25% ( 11.76% reduction)    TODAY'S TREATMENT:     RLE/RLQ MLD  RLE multilayer , knee length gradient compression  bandaging Pt/ FAMILY EDU   PATIENT EDUCATION:  Continued Pt/ CG edu for lymphedema self care home program throughout session. Topics include outcome of comparative limb volumetrics- starting vs changing limb volumes over time; technology and gradient techniques used for short stretch, multilayer compression wrapping,  simple self-MLD, therapeutic lymphatic pumping exercises, skin/nail care, LE precautions,. compression garment recommendations and specifications, donning and doffing gadgets, wear and care schedule and compression garment donning / doffing w assistive devices. Discussed progress towards all OT goals since commencing CDT. All questions answered to the Pt's satisfaction w comfort and fit. Good return. Person educated: Patient and Spouse Education method: Explanation, Demonstration, and Handouts Education comprehension: verbalized understanding, returned demonstration, verbal cues required, and needs further education   HOME EXERCISE PROGRAM: Lymphatic Pumping Therex-  1 set of 10, 2 x daily, in order, seated   Daily skin inspection and care with low ph lotion matching skin ph to limit infection risk Simple Self-MLD 1 x daily During Intensive Phase CDT: multilayer compression wraps from foot to groin using gradient techniques, one limb at a time  to ensure safety During Self -Management Phase CDT: Fit with custom daytime compression garments: Elvarex, custom, flat knit, ccl 2 ( 23-32 mmHg) knee highs. Consider pairing with Compreshorts Capri length pantyhose for containment of buttocks, thighs and abdomen.  Fit with BLE knee length Jobst RELAX w/ open toe and zipper to limit ongoing tissue fibrosis formation and facilitate increased lymphatic function during HOS  Custom-made gradient compression garments and HOS devices are medically necessary in this case because they are uniquely sized and shaped to fit the exact dimensions of the affected extremities, and to provide accurate and consistent gradient compression essential for optimally managing this patient's symptoms of chronic, progressive lymphedema. Multiple custom compression garments are needed for optimal hygiene. Custom compression garments should be replaced q 3-6 months When worn consistently for optimal effectiveness.  ASSESSMENT: Continued MLD to RLE/RLQ today with emphasis on ankle and distal leg fibrosis. Pt denied pain with manual therapy. Reapplied compression wraps as established. We'll fit R custom stocking ASAP.  Cont 2 x weekly as per POC.  OBJECTIVE IMPAIRMENTS: Abnormal gait, decreased activity tolerance, decreased balance, decreased knowledge of condition, decreased knowledge of use of DME, decreased mobility, difficulty walking, decreased ROM, increased edema, impaired sensation, obesity, pain, and chronic , progressive swelling and pain of buttocks, hips, abdomen and legs .   ACTIVITY LIMITATIONS: carrying, lifting, bending, sitting, standing, squatting, sleeping, stairs, transfers, bed mobility, bathing, dressing, hygiene/grooming,  caring for others, and leisure pursuits, social participation, productive/ work activities  PARTICIPATION LIMITATIONS: meal prep, cleaning, laundry, interpersonal relationship, driving, shopping, community activity, occupation, yard work, and church  PERSONAL FACTORS: Age, Past/current experiences, hx of panic attacks and claustrophobia, Time since onset of injury/illness/exacerbation,, motivation, supportive spouse and family are also affecting patient's functional outcome.   REHAB POTENTIAL: Fair Unfortunately the Lipedema does not respond to CDT and is unchanged by this protocol. Fortunately lymphedema  does respond to differing degrees based on the quality and amount of fatty fibrosis present obstructing lymphatics, and patient's tolerance if skin is hypersensitive.   EVALUATION COMPLEXITY: Moderate   GOALS: Goals reviewed with patient? Yes  SHORT TERM GOALS: Target date: 4th OT Rx visit   Pt will demonstrate understanding of lymphedema precautions and prevention strategies with modified independence using a printed reference to identify at least 5 precautions and discussing how s/he may implement them into daily life to reduce risk of progression with modified assistance ( printed reference). Baseline: Max A Goal status: 12/12/22 PROGRESSING 01/11/23 GOAL MET  2.  Pt will be able to apply multilayer, thigh length, compression wraps using gradient techniques with Max caregiver assistance to decrease limb volume, to limit infection risk, and to limit lymphedema progression.  Baseline: Dependent Goal status: 11/10/22 Goal Met  LONG TERM GOALS: Target date: 04/24/23  Given this patient's Intake score of 36.76 % on the Lymphedema Life Impact Scale (LLIS), patient will experience a reduction of at least 5 points in her perceived level of functional impairment resulting from lymphedema to improve functional performance and quality of life (QOL). Baseline: 36.76% Goal status: 02/20/23  PROGRESSING  2.  Given this patient's Intake score of 56/100% on the functional outcomes FOTO tool, patient will experience an increase in function of 3 points to improve basic and instrumental ADLs performance, including lymphedema self-care.  Baseline: 56% Goal status: 02/20/23 PROGRESSING  3.  During Intensive phase CDT Pt will achieve at least 85% compliance with all lymphedema self-care home program components, including  daily skin care, multilayer , gradient compression wraps with daily changes, daily simple self MLD and daily lymphatic pumping therex to achieve optimal clinical outcome and to habituate self care regime for optimal LE self-management over time. Baseline: Dependent Goal status: 01/09/23 GOAL MET  4.  Pt will achieve at least a 10% volume reductions bilaterally below the knees to return limb to more typical size and shape, to limit infection risk and LE progression, to decrease pain, to improve function, and to improve body image and QOL. Baseline: Dependent Goal status: 12/12/22 PROGRESSING. 01/11/23 GOAL MET for L LEG  03/28/23 Volumetrics reveal that the R leg is DECREASED in volume BY 1.05 % since last measured on 02/20/23; and DECREASED by 5.4% since  09/02/22.   5.  Pt will be able to don and doff appropriate compression garments and devices using assistive devices and extra time within 1 week of issue date for optimal lymphedema self-care. Baseline: Dependent Goal status: 01/25/23 GOAL MET   PLAN:  PT FREQUENCY: 2x/week  PT DURATION: 12  weeks other: and PRN  PLANNED INTERVENTIONS: Therapeutic exercises, Therapeutic activity, Patient/Family education, Self Care, DME instructions, Manual lymph drainage, Compression bandaging, Manual therapy, and skin care during MLD, fit with appropriate custom compression garments and devices, fit with appropriate compression device  which  follows lymphatic pathways and anatomic distribution  PLAN FOR NEXT SESSION:   MLD Compression Skin care Pt/ family edu for LE self care   Loel Dubonnet, MS, OTR/L, CLT-LANA 04/27/23 4:00 PM

## 2023-05-02 ENCOUNTER — Encounter: Payer: Self-pay | Admitting: Occupational Therapy

## 2023-05-02 ENCOUNTER — Ambulatory Visit: Payer: 59 | Admitting: Occupational Therapy

## 2023-05-02 DIAGNOSIS — I890 Lymphedema, not elsewhere classified: Secondary | ICD-10-CM

## 2023-05-02 NOTE — Therapy (Signed)
OUTPATIENT OCCUPATIONAL THERAPY TREATMENT NOTE LOWER EXTREMITY LYMPHEDEMA  Patient Name: Mary [name] MRN: [id number] DOB:03/14/1961, 62 y.o., female Today's Date: 05/02/2023    END OF SESSION:  OT End of Session - 05/02/23 1512     Visit Number 49    Number of Visits 72    Date for OT Re-Evaluation 07/26/23    OT Start Time 0305    OT Stop Time 0357    OT Time Calculation (min) 52 min    Activity Tolerance Patient tolerated treatment well;No increased pain    Behavior During Therapy WFL for tasks assessed/performed                 Past Medical History:  Diagnosis Date   Anemia    low iron at times   Arthritis    Colon polyps    Complication of anesthesia 1991   Pt reports she awoke during plantar fascia surgery and felt "everything"   Heart murmur    " small per patient"    Hyperlipidemia    Hypertension    Hypothyroidism    Lactose intolerance    PAC (premature atrial contraction)    Palpitations    Panic attacks    Pt reports restrictive clothing/areas and dry mouth cause her to have attack.   Peripheral vascular disease (HCC)    "small" clot after thermal ablasion   Pneumonia 2016   "walking pneumonia" 3 times in 2016   Shortness of breath    going upstairs (due to weight). not so much since weight loss   Sleep apnea    after weight loss does not have to use cpap   Vitamin B12 deficiency    Vitamin D deficiency    Past Surgical History:  Procedure Laterality Date   APPENDECTOMY  1985   CESAREAN SECTION  1987   twins   COLONOSCOPY     COLONOSCOPY WITH PROPOFOL N/A 02/27/2018   Procedure: COLONOSCOPY WITH PROPOFOL;  Surgeon: Toledo, Boykin Nearing, MD;  Location: [facility];  Service: Gastroenterology;  Laterality: N/A;   IRRIGATION AND DEBRIDEMENT HEMATOMA Right 11/12/2015   Procedure: IRRIGATION AND DEBRIDEMENT HEMATOMA;  Surgeon: Earline Mayotte, MD;  Location: [facility];  Service: General;  Laterality: Right;   KNEE ARTHROSCOPY WITH MEDIAL  MENISECTOMY Left 07/15/2020   Procedure: Left knee arthroscopy with partial medial or lateral menisectomy, possible chondroplasty, possible partial synovectomy;  Surgeon: Lyndle Herrlich, MD;  Location: [facility];  Service: Orthopedics;  Laterality: Left;   LAMINECTOMY  07/10/2013   L4   L5   DR Yetta Barre   LAPAROSCOPIC SALPINGO OOPHERECTOMY Right 04/17/2014   Procedure: LAPAROSCOPIC SALPINGO OOPHORECTOMY RIGHT;  Surgeon: Adolphus Birchwood, MD;  Location: [facility];  Service: Gynecology;  Laterality: Right;   LYSIS OF ADHESION  04/17/2014   Procedure: LYSIS OF ADHESION;  Surgeon: Adolphus Birchwood, MD;  Location: [facility];  Service: Gynecology;;   MAXIMUM ACCESS (MAS)POSTERIOR LUMBAR INTERBODY FUSION (PLIF) 1 LEVEL  07/10/2013   Procedure: FOR MAXIMUM ACCESS (MAS) POSTERIOR LUMBAR INTERBODY FUSION (PLIF) LUMBAR FOUR-FIVE;  Surgeon: Tia Alert, MD;  Location: [facility];  Service: Neurosurgery;;  FOR MAXIMUM ACCESS (MAS) POSTERIOR LUMBAR INTERBODY FUSION (PLIF) LUMBAR FOUR-FIVE   OOPHORECTOMY     LSO   OPEN REDUCTION INTERNAL FIXATION (ORIF) DISTAL RADIAL FRACTURE Left 09/20/2018   Procedure: OPEN REDUCTION INTERNAL FIXATION (ORIF) DISTAL RADIAL FRACTURE, LEFT;  Surgeon: Kennedy Bucker, MD;  Location: [facility];  Service: Orthopedics;  Laterality: Left;   PLANTAR FASCIA SURGERY  1999   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   VAGINAL HYSTERECTOMY  2001   for bleeding and LSO for cyst   Patient Active Problem List   Diagnosis Date Noted   Reactive hypoglycemia 02/16/2022   Prediabetes 02/16/2022   Chronic venous insufficiency 12/23/2021   Lymphedema 12/13/2021   Panic attacks    Strain of knee 12/24/2019   Vitamin B12 deficiency 10/08/2019   Arthritis 07/16/2019   Edema 07/16/2019   History of abdominal pain 07/16/2019   Low back pain 07/16/2019   Vitamin D deficiency 07/16/2019   Hypothyroidism 02/28/2019   History of gastric bypass 02/28/2019   Obstructive sleep apnea syndrome 02/28/2019   Insomnia 02/28/2019    Class 3 severe obesity due to excess calories with serious comorbidity and body mass index (BMI) of 40.0 to 44.9 in adult (HCC) 02/28/2019   Elevated BP without diagnosis of hypertension 02/28/2019   Mixed hyperlipidemia 02/28/2019   Hot flashes 02/28/2019   BMI 40.0-44.9, adult (HCC) 12/16/2015   Hematoma of lower extremity 11/12/2015   Essential hypertension 06/20/2014   Pure hypercholesterolemia 06/20/2014   S/P lumbar spinal fusion 07/10/2013   Arthrodesis status 07/10/2013   Palpitations 01/02/2012   Edema of both legs 01/02/2012    PCP: Joni Reining, PA-C  REFERRING PROVIDER: Foster Simpson, DO  REFERRING DIAG: I89.0  THERAPY DIAG:  Lymphedema, not elsewhere classified  Rationale for Evaluation and Treatment: Rehabilitation  ONSET DATE: Insidious onset with progression over time. Greater than 20 years  SUBJECTIVE:                                                                                                                                                                                          SUBJECTIVE STATEMENT:Mary [name] presents to OT visit to address BLE lipo-lymphedema. Pt is accompanied by her supportive spouse, [name]. Pt denies LE-related pain in her legs. Pt has no new concerns.We discussed Pt calling DME vendor to check status on her custom compression garments.  PERTINENT HISTORY and contributing factors include, OA, HTN, Panic attacks 2/2 restrictive clothing, OSA (not using CPAP), s/p hysterectomy 1987, L knee sx, oophorectomy, s/p hysterectomy 2001, Gastric Bypass 2007, s/p plantar fascia sx 1999, obesity, insomnia  PAIN:  Are you having LE-related pain? No: NPRS scale: 0/10.  Pain location: BLE- generalized, knees Pain description: discomfort, heavy, tired, full, sore Aggravating factors: standing, walking, extended dependent sitting Relieving factors: elevation  PRECAUTIONS: Other: LYMPHEDEMA PRECAUTIONS: HYPOTHYROID, DIABETES SKIN  PRECAUTIONS  WEIGHT BEARING RESTRICTIONS: No  FALLS:  Has patient fallen since last visit? 04/05/23 YES Pt had a  fall in the middle of the night. She  states she got up to go to the bathroom and when rounding the bottom of the bed she passed out and woke up on the floor. Pt was able to get herself up . She is sore today, but uninjured by report. She was wearing the portable heart monitor at the time.   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  BLE COMPARATIVE LIMB VOLUMETRICS:  INTAKE: 11/01/22  LANDMARK RIGHT    R LEG (A-D) 5947.2 ml  R THIGH (E-G) 9750.0 ml  R FULL LIMB (A-G) 15697.2 ml  Limb Volume differential (LVD)  %  Volume change since initial %  Volume change overall V  (Blank rows = not tested)  LANDMARK LEFT   L LEG (A-D) 6011.6 ml  L THIGH (E-G) 9602.5 ml  L FULL LIMB (A-G) 15614.1 ml  Limb Volume differential (LVD)  LEG LVD = 1.8%, L>R % THIGH LVD = 1.5%, R>L FULL LIMB LVD = 0.53%, R>L  Volume change since initial %  Volume change overall %  (Blank rows = not tested)   9th VISIT 11/24/22  LANDMARK LEFT   L LEG (A-D) 5918.6 ml  L THIGH (E-G) 9265.5 ml  L FULL LIMB (A-G) 15184.1  ml  Limb Volume differential (LVD)  LEG volume = Decreased 1.5%;  THIGH volume decreased 3.5 %, AND  L FULL LIMB VOLUME dec BY 2.8 % SINCE 11/01/22  Volume change since initial %  Volume change overall %    20 th VISIT 01/11/23  LANDMARK LEFT   L LEG (A-D) 5181.2 ml  L THIGH (E-G)  ml  L FULL LIMB (A-G)  ml  Limb Volume differential (LVD)  L LEG (A-D) volume = Decreased 12.4% since 11/24/22. 10% GOAL MET. Did not measure L thigh or full limb today since we have not  been bandaging above the knee.   Volume change since initial L LEG (A-D) volume reduction since commencing CDT on 09/02/22 measures 13.8% 10% GOAL MET.   30 th VISIT 01/11/23  LANDMARK RLE  L LEG (A-D) 5691.5  ml  L THIGH (E-G)  ml  L FULL LIMB (A-G)  ml  Limb Volume since initial 11/01/22 DECREASED BY 4.3%  Volume change overall    39 th VISIT 03/28/23  LANDMARK RLE  L LEG (A-D) 5632.0 ml  L THIGH (E-G)  ml  L FULL LIMB (A-G)  ml  Limb Volume since initial 11/01/22 DECREASED BY 1.05 % since last measured on 02/20/23; and DECREASED by 5.4%  since  09/02/22  Volume change overall      50 th VISIT   LANDMARK RLE  R LEG (A-D) ml  RTHIGH (E-G)  ml  R FULL LIMB (A-G)  ml  Limb Volume since initial 11/01/22   Volume change overall    FOTO functional outcome score:      Lymphedema Life Impact Scale (LLIS):   Date FOTO LLIS  Initial 10/13/22 56%   Final 04/12/23 55%    Initial 10/13/22  36.76%  Final 04/12/23  25% ( 11.76% reduction)    TODAY'S TREATMENT:     RLE/RLQ MLD  RLE multilayer , knee length gradient compression  bandaging Pt/ FAMILY EDU  PATIENT EDUCATION:  Continued Pt/ CG edu for lymphedema self care home program throughout session. Topics include outcome of comparative limb volumetrics- starting vs changing limb volumes over time; technology and gradient techniques used for short stretch, multilayer compression wrapping, simple self-MLD, therapeutic lymphatic pumping exercises, skin/nail care, LE precautions,. compression garment recommendations and specifications, donning and doffing gadgets, wear and care schedule and compression garment donning / doffing w assistive devices. Discussed progress towards all OT goals since commencing CDT. All questions answered to the Pt's satisfaction w comfort and fit. Good return. Person educated: Patient and Spouse Education method: Explanation, Demonstration, and Handouts Education comprehension: verbalized understanding, returned  demonstration, verbal cues required, and needs further education   HOME EXERCISE PROGRAM: Lymphatic Pumping Therex-  1 set of 10, 2 x daily, in order, seated  Daily skin inspection and care with low ph lotion matching skin ph to limit infection risk Simple Self-MLD 1 x daily During Intensive Phase CDT: multilayer compression wraps from foot to groin using gradient techniques, one limb at a time  to ensure safety During Self -Management Phase CDT: Fit with custom daytime compression garments: Elvarex, custom, flat knit, ccl 2 ( 23-32 mmHg) knee highs. Consider pairing with Compreshorts Capri length pantyhose for containment of buttocks, thighs and abdomen.  Fit with BLE knee length Jobst RELAX w/ open toe and zipper to limit ongoing tissue fibrosis formation and facilitate increased lymphatic function during HOS  Custom-made gradient compression garments and HOS devices are medically necessary in this case because they are uniquely sized and shaped to fit the exact dimensions of the affected extremities, and to provide accurate and consistent gradient compression essential for optimally managing this patient's symptoms of chronic, progressive lymphedema. Multiple custom compression garments are needed for optimal hygiene. Custom compression garments should be replaced q 3-6 months When worn consistently for optimal effectiveness.  ASSESSMENT: Continued MLD to RLE/RLQ today with emphasis on ankle and distal leg fibrosis. Pt denied pain with manual therapy. Reapplied compression wraps as established. We'll fit R custom stocking ASAP.  Cont 2 x weekly as per POC.  OBJECTIVE IMPAIRMENTS: Abnormal gait, decreased activity tolerance, decreased balance, decreased knowledge of condition, decreased knowledge of use of DME, decreased mobility, difficulty walking, decreased ROM, increased edema, impaired sensation, obesity, pain, and chronic , progressive swelling and pain of buttocks, hips, abdomen and legs .    ACTIVITY LIMITATIONS: carrying, lifting, bending, sitting, standing, squatting, sleeping, stairs, transfers, bed mobility, bathing, dressing, hygiene/grooming, caring for others, and leisure pursuits, social participation, productive/ work activities  PARTICIPATION LIMITATIONS: meal prep, cleaning, laundry, interpersonal relationship, driving, shopping, community activity, occupation, yard work, and church  PERSONAL FACTORS: Age, Past/current experiences, hx of panic attacks and claustrophobia, Time since onset of injury/illness/exacerbation,, motivation, supportive spouse and family are also affecting patient's functional outcome.   REHAB POTENTIAL: Fair Unfortunately the Lipedema does not respond to CDT and is unchanged by this protocol. Fortunately lymphedema  does respond to differing degrees based on the quality and amount of fatty fibrosis present obstructing lymphatics, and patient's tolerance if skin is hypersensitive.   EVALUATION COMPLEXITY: Moderate   GOALS: Goals reviewed with patient? Yes  SHORT TERM GOALS: Target date: 4th OT Rx visit   Pt will demonstrate understanding of lymphedema precautions and prevention strategies with modified independence using a printed reference to identify at least 5 precautions and discussing how s/he may implement them into daily life to reduce risk of progression with modified assistance ( printed reference). Baseline: Max A Goal status:  12/12/22 PROGRESSING 01/11/23 GOAL MET  2.  Pt will be able to apply multilayer, thigh length, compression wraps using gradient techniques with Max caregiver assistance to decrease limb volume, to limit infection risk, and to limit lymphedema progression.  Baseline: Dependent Goal status: 11/10/22 Goal Met  LONG TERM GOALS: Target date: 04/24/23  Given this patient's Intake score of 36.76 % on the Lymphedema Life Impact Scale (LLIS), patient will experience a reduction of at least 5 points in her perceived level  of functional impairment resulting from lymphedema to improve functional performance and quality of life (QOL). Baseline: 36.76% Goal status: 02/20/23 PROGRESSING  2.  Given this patient's Intake score of 56/100% on the functional outcomes FOTO tool, patient will experience an increase in function of 3 points to improve basic and instrumental ADLs performance, including lymphedema self-care.  Baseline: 56% Goal status: 02/20/23 PROGRESSING  3.  During Intensive phase CDT Pt will achieve at least 85% compliance with all lymphedema self-care home program components, including  daily skin care, multilayer , gradient compression wraps with daily changes, daily simple self MLD and daily lymphatic pumping therex to achieve optimal clinical outcome and to habituate self care regime for optimal LE self-management over time. Baseline: Dependent Goal status: 01/09/23 GOAL MET  4.  Pt will achieve at least a 10% volume reductions bilaterally below the knees to return limb to more typical size and shape, to limit infection risk and LE progression, to decrease pain, to improve function, and to improve body image and QOL. Baseline: Dependent Goal status: 12/12/22 PROGRESSING. 01/11/23 GOAL MET for L LEG  03/28/23 Volumetrics reveal that the R leg is DECREASED in volume BY 1.05 % since last measured on 02/20/23; and DECREASED by 5.4% since  09/02/22.   5.  Pt will be able to don and doff appropriate compression garments and devices using assistive devices and extra time within 1 week of issue date for optimal lymphedema self-care. Baseline: Dependent Goal status: 01/25/23 GOAL MET   PLAN:  PT FREQUENCY: 2x/week  PT DURATION: 12  weeks other: and PRN  PLANNED INTERVENTIONS: Therapeutic exercises, Therapeutic activity, Patient/Family education, Self Care, DME instructions, Manual lymph drainage, Compression bandaging, Manual therapy, and skin care during MLD, fit with appropriate custom compression garments and  devices, fit with appropriate compression device  which  follows lymphatic pathways and anatomic distribution  PLAN FOR NEXT SESSION:  MLD Compression Skin care Pt/ family edu for LE self care   Loel Dubonnet, MS, OTR/L, CLT-LANA 05/02/23 4:03 PM

## 2023-05-04 ENCOUNTER — Ambulatory Visit: Payer: 59 | Admitting: Occupational Therapy

## 2023-05-04 DIAGNOSIS — I890 Lymphedema, not elsewhere classified: Secondary | ICD-10-CM | POA: Diagnosis not present

## 2023-05-04 NOTE — Therapy (Addendum)
OUTPATIENT OCCUPATIONAL THERAPY TREATMENT NOTE AND PROGRESS REPORT LOWER EXTREMITY LYMPHEDEMA  Patient Name: Mary [name] MRN: [id number] DOB:Dec 07, 1960, 62 y.o., female Today's Date: 05/04/2023  REPORTING PERIOD: 04/04/23 - 05/04/23  END OF SESSION:  OT End of Session - 05/04/23 1517     Visit Number 50    Number of Visits 72    Date for OT Re-Evaluation 07/26/23    OT Start Time 0303    OT Stop Time 0402    OT Time Calculation (min) 59 min    Activity Tolerance Patient tolerated treatment well;No increased pain    Behavior During Therapy WFL for tasks assessed/performed                 Past Medical History:  Diagnosis Date   Anemia    low iron at times   Arthritis    Colon polyps    Complication of anesthesia 1991   Pt reports she awoke during plantar fascia surgery and felt "everything"   Heart murmur    " small per patient"    Hyperlipidemia    Hypertension    Hypothyroidism    Lactose intolerance    PAC (premature atrial contraction)    Palpitations    Panic attacks    Pt reports restrictive clothing/areas and dry mouth cause her to have attack.   Peripheral vascular disease (HCC)    "small" clot after thermal ablasion   Pneumonia 2016   "walking pneumonia" 3 times in 2016   Shortness of breath    going upstairs (due to weight). not so much since weight loss   Sleep apnea    after weight loss does not have to use cpap   Vitamin B12 deficiency    Vitamin D deficiency    Past Surgical History:  Procedure Laterality Date   APPENDECTOMY  1985   CESAREAN SECTION  1987   twins   COLONOSCOPY     COLONOSCOPY WITH PROPOFOL N/A 02/27/2018   Procedure: COLONOSCOPY WITH PROPOFOL;  Surgeon: Toledo, Boykin Nearing, MD;  Location: [facility];  Service: Gastroenterology;  Laterality: N/A;   IRRIGATION AND DEBRIDEMENT HEMATOMA Right 11/12/2015   Procedure: IRRIGATION AND DEBRIDEMENT HEMATOMA;  Surgeon: Earline Mayotte, MD;  Location: [facility];  Service: General;   Laterality: Right;   KNEE ARTHROSCOPY WITH MEDIAL MENISECTOMY Left 07/15/2020   Procedure: Left knee arthroscopy with partial medial or lateral menisectomy, possible chondroplasty, possible partial synovectomy;  Surgeon: Lyndle Herrlich, MD;  Location: [facility];  Service: Orthopedics;  Laterality: Left;   LAMINECTOMY  07/10/2013   L4   L5   DR Yetta Barre   LAPAROSCOPIC SALPINGO OOPHERECTOMY Right 04/17/2014   Procedure: LAPAROSCOPIC SALPINGO OOPHORECTOMY RIGHT;  Surgeon: Adolphus Birchwood, MD;  Location: [facility];  Service: Gynecology;  Laterality: Right;   LYSIS OF ADHESION  04/17/2014   Procedure: LYSIS OF ADHESION;  Surgeon: Adolphus Birchwood, MD;  Location: [facility];  Service: Gynecology;;   MAXIMUM ACCESS (MAS)POSTERIOR LUMBAR INTERBODY FUSION (PLIF) 1 LEVEL  07/10/2013   Procedure: FOR MAXIMUM ACCESS (MAS) POSTERIOR LUMBAR INTERBODY FUSION (PLIF) LUMBAR FOUR-FIVE;  Surgeon: Tia Alert, MD;  Location: [facility];  Service: Neurosurgery;;  FOR MAXIMUM ACCESS (MAS) POSTERIOR LUMBAR INTERBODY FUSION (PLIF) LUMBAR FOUR-FIVE   OOPHORECTOMY     LSO   OPEN REDUCTION INTERNAL FIXATION (ORIF) DISTAL RADIAL FRACTURE Left 09/20/2018   Procedure: OPEN REDUCTION INTERNAL FIXATION (ORIF) DISTAL RADIAL FRACTURE, LEFT;  Surgeon: Kennedy Bucker, MD;  Location: [facility];  Service: Orthopedics;  Laterality: Left;   PLANTAR FASCIA SURGERY  1999   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   VAGINAL HYSTERECTOMY  2001   for bleeding and LSO for cyst   Patient Active Problem List   Diagnosis Date Noted   Reactive hypoglycemia 02/16/2022   Prediabetes 02/16/2022   Chronic venous insufficiency 12/23/2021   Lymphedema 12/13/2021   Panic attacks    Strain of knee 12/24/2019   Vitamin B12 deficiency 10/08/2019   Arthritis 07/16/2019   Edema 07/16/2019   History of abdominal pain 07/16/2019   Low back pain 07/16/2019   Vitamin D deficiency 07/16/2019   Hypothyroidism 02/28/2019   History of gastric bypass 02/28/2019   Obstructive  sleep apnea syndrome 02/28/2019   Insomnia 02/28/2019   Class 3 severe obesity due to excess calories with serious comorbidity and body mass index (BMI) of 40.0 to 44.9 in adult (HCC) 02/28/2019   Elevated BP without diagnosis of hypertension 02/28/2019   Mixed hyperlipidemia 02/28/2019   Hot flashes 02/28/2019   BMI 40.0-44.9, adult (HCC) 12/16/2015   Hematoma of lower extremity 11/12/2015   Essential hypertension 06/20/2014   Pure hypercholesterolemia 06/20/2014   S/P lumbar spinal fusion 07/10/2013   Arthrodesis status 07/10/2013   Palpitations 01/02/2012   Edema of both legs 01/02/2012    PCP: Mary Reining, PA-C  REFERRING PROVIDER: Foster Simpson, DO  REFERRING DIAG: I89.0  THERAPY DIAG:  Lymphedema, not elsewhere classified  Rationale for Evaluation and Treatment: Rehabilitation  ONSET DATE: Insidious onset with progression over time. Greater than 20 years  SUBJECTIVE:                                                                                                                                                                                          SUBJECTIVE STATEMENT:Mary [name] presents to OT visit to address BLE lipo-lymphedema. Pt is accompanied by her supportive spouse, Mary [name]. Pt denies LE-related pain in her legs. Pt has no new concerns.We discussed Pt calling DME vendor to check status on her custom compression garments. Vendor did not answer Pt's questions directly and she is unsure of order status.   PERTINENT HISTORY and contributing factors include, OA, HTN, Panic attacks 2/2 restrictive clothing, OSA (not using CPAP), s/p hysterectomy 1987, L knee sx, oophorectomy, s/p hysterectomy 2001, Gastric Bypass 2007, s/p plantar fascia sx 1999, obesity, insomnia  PAIN:  Are you having LE-related pain? No: NPRS scale: 0/10.  Pain location: BLE- generalized, knees Pain description: discomfort, heavy, tired, full, sore Aggravating factors: standing, walking,  extended dependent sitting Relieving factors: elevation  PRECAUTIONS: Other: LYMPHEDEMA PRECAUTIONS: HYPOTHYROID, DIABETES SKIN PRECAUTIONS  WEIGHT BEARING RESTRICTIONS: No  FALLS:  Has patient fallen since last visit? 04/05/23 YES Pt had a  fall in the middle of the night. She states she got up to go to the bathroom and when rounding the bottom of the bed she passed out and woke up on the floor. Pt was able to get herself up . She is sore today, but uninjured by report. She was wearing the portable heart monitor at the time.   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  BLE COMPARATIVE LIMB VOLUMETRICS:  INTAKE: 11/01/22  LANDMARK RIGHT    R LEG (A-D) 5947.2 ml  R THIGH (E-G) 9750.0 ml  R FULL LIMB (A-G) 15697.2 ml  Limb Volume differential (LVD)  %  Volume change since initial %  Volume change overall V  (Blank rows = not tested)  LANDMARK LEFT   L LEG (A-D) 6011.6 ml  L THIGH (E-G) 9602.5 ml  L FULL LIMB (A-G) 15614.1 ml  Limb Volume differential (LVD)  LEG LVD = 1.8%, L>R % THIGH LVD = 1.5%, R>L FULL LIMB LVD = 0.53%, R>L  Volume change since initial %  Volume change overall %  (Blank rows = not tested)   9th VISIT 11/24/22  LANDMARK LEFT   L LEG (A-D) 5918.6 ml  L THIGH (E-G) 9265.5 ml  L FULL LIMB (A-G) 15184.1  ml  Limb Volume differential (LVD)  LEG volume = Decreased 1.5%;  THIGH volume decreased 3.5 %, AND  L FULL LIMB VOLUME dec BY 2.8 % SINCE 11/01/22  Volume change since initial %  Volume change overall %    20 th VISIT 01/11/23  LANDMARK LEFT   L LEG (A-D) 5181.2 ml  L THIGH (E-G)  ml  L FULL LIMB (A-G)  ml  Limb Volume differential (LVD)   L LEG (A-D) volume = Decreased 12.4% since 11/24/22. 10% GOAL MET. Did not measure L thigh or full limb today since we have not been bandaging above the knee.   Volume change since initial L LEG (A-D) volume reduction since commencing CDT on 09/02/22 measures 13.8% 10% GOAL MET.   30 th VISIT 01/11/23  LANDMARK RLE  L LEG (A-D) 5691.5  ml  L THIGH (E-G)  ml  L FULL LIMB (A-G)  ml  Limb Volume since initial 11/01/22 DECREASED BY 4.3%  Volume change overall    39 th VISIT 03/28/23  LANDMARK RLE  L LEG (A-D) 5632.0 ml  L THIGH (E-G)  ml  L FULL LIMB (A-G)  ml  Limb Volume since initial 11/01/22 DECREASED BY 1.05 % since last measured on 02/20/23; and DECREASED by 5.4%  since  09/02/22  Volume change overall      50 th VISIT   LANDMARK RLE  R LEG (A-D) ml  RTHIGH (E-G)  ml  R FULL LIMB (A-G)  ml  Limb Volume since initial 11/01/22   Volume change overall    FOTO functional outcome score:      Lymphedema Life Impact Scale (LLIS):   Date FOTO LLIS  Initial 10/13/22 56%   Final 04/12/23 55%    Initial 10/13/22  36.76%  Final 04/12/23  25% ( 11.76% reduction)    TODAY'S  TREATMENT:     RLE/RLQ MLD  RLE multilayer , knee length gradient compression  bandaging Pt/ FAMILY EDU   PATIENT EDUCATION:  Continued Pt/ CG edu for lymphedema self care home program throughout session. Topics include outcome of comparative limb volumetrics- starting vs changing limb volumes over time; technology and gradient techniques used for short stretch, multilayer compression wrapping, simple self-MLD, therapeutic lymphatic pumping exercises, skin/nail care, LE precautions,. compression garment recommendations and specifications, donning and doffing gadgets, wear and care schedule and compression garment donning / doffing w assistive devices. Discussed progress towards all OT goals since commencing CDT. All questions answered to the Pt's satisfaction w comfort and fit. Good return. Person educated: Patient and  Spouse Education method: Explanation, Demonstration, and Handouts Education comprehension: verbalized understanding, returned demonstration, verbal cues required, and needs further education   HOME EXERCISE PROGRAM: Lymphatic Pumping Therex-  1 set of 10, 2 x daily, in order, seated  Daily skin inspection and care with low ph lotion matching skin ph to limit infection risk Simple Self-MLD 1 x daily During Intensive Phase CDT: multilayer compression wraps from foot to groin using gradient techniques, one limb at a time  to ensure safety During Self -Management Phase CDT: Fit with custom daytime compression garments: Elvarex, custom, flat knit, ccl 2 ( 23-32 mmHg) knee highs. Consider pairing with Compreshorts Capri length pantyhose for containment of buttocks, thighs and abdomen.  Fit with BLE knee length Jobst RELAX w/ open toe and zipper to limit ongoing tissue fibrosis formation and facilitate increased lymphatic function during HOS  Custom-made gradient compression garments and HOS devices are medically necessary in this case because they are uniquely sized and shaped to fit the exact dimensions of the affected extremities, and to provide accurate and consistent gradient compression essential for optimally managing this patient's symptoms of chronic, progressive lymphedema. Multiple custom compression garments are needed for optimal hygiene. Custom compression garments should be replaced q 3-6 months When worn consistently for optimal effectiveness.  ASSESSMENT: Pt continues to make steady progress towards all OT goals. Please review GOALS section for detailed assessments. Continued MLD to RLE/RLQ today with emphasis on ankle and distal leg fibrosis. Pt denied pain with manual therapy. Reapplied compression wraps as established. We'll fit R custom stocking ASAP.  Cont 2 x weekly as per POC until garment fitting is complete.  OBJECTIVE IMPAIRMENTS: Abnormal gait, decreased activity tolerance,  decreased balance, decreased knowledge of condition, decreased knowledge of use of DME, decreased mobility, difficulty walking, decreased ROM, increased edema, impaired sensation, obesity, pain, and chronic , progressive swelling and pain of buttocks, hips, abdomen and legs .   ACTIVITY LIMITATIONS: carrying, lifting, bending, sitting, standing, squatting, sleeping, stairs, transfers, bed mobility, bathing, dressing, hygiene/grooming, caring for others, and leisure pursuits, social participation, productive/ work activities  PARTICIPATION LIMITATIONS: meal prep, cleaning, laundry, interpersonal relationship, driving, shopping, community activity, occupation, yard work, and church  PERSONAL FACTORS: Age, Past/current experiences, hx of panic attacks and claustrophobia, Time since onset of injury/illness/exacerbation,, motivation, supportive spouse and family are also affecting patient's functional outcome.   REHAB POTENTIAL: Fair Unfortunately the Lipedema does not respond to CDT and is unchanged by this protocol. Fortunately lymphedema  does respond to differing degrees based on the quality and amount of fatty fibrosis present obstructing lymphatics, and patient's tolerance if skin is hypersensitive.   EVALUATION COMPLEXITY: Moderate   GOALS: Goals reviewed with patient? Yes  SHORT TERM GOALS: Target date: 4th OT Rx visit   Pt will demonstrate understanding of  lymphedema precautions and prevention strategies with modified independence using a printed reference to identify at least 5 precautions and discussing how s/he may implement them into daily life to reduce risk of progression with modified assistance ( printed reference). Baseline: Max A Goal status: 12/12/22 PROGRESSING 01/11/23 GOAL MET  2.  Pt will be able to apply multilayer, thigh length, compression wraps using gradient techniques with Max caregiver assistance to decrease limb volume, to limit infection risk, and to limit lymphedema  progression.  Baseline: Dependent Goal status: 11/10/22 Goal Met  LONG TERM GOALS: Target date: 04/24/23  Given this patient's Intake score of 36.76 % on the Lymphedema Life Impact Scale (LLIS), patient will experience a reduction of at least 5 points in her perceived level of functional impairment resulting from lymphedema to improve functional performance and quality of life (QOL). Baseline: 36.76% Goal status: 02/20/23 PROGRESSING  2.  Given this patient's Intake score of 56/100% on the functional outcomes FOTO tool, patient will experience an increase in function of 3 points to improve basic and instrumental ADLs performance, including lymphedema self-care.  Baseline: 56% Goal status: 02/20/23 PROGRESSING  3.  During Intensive phase CDT Pt will achieve at least 85% compliance with all lymphedema self-care home program components, including  daily skin care, multilayer , gradient compression wraps with daily changes, daily simple self MLD and daily lymphatic pumping therex to achieve optimal clinical outcome and to habituate self care regime for optimal LE self-management over time. Baseline: Dependent Goal status: 01/09/23 GOAL MET  4.  Pt will achieve at least a 10% volume reductions bilaterally below the knees to return limb to more typical size and shape, to limit infection risk and LE progression, to decrease pain, to improve function, and to improve body image and QOL. Baseline: Dependent Goal status: 12/12/22 PROGRESSING. 01/11/23 GOAL MET for L LEG  03/28/23 Volumetrics reveal that the R leg is DECREASED in volume BY 1.05 % since last measured on 02/20/23; and DECREASED by 5.4% since  09/02/22.   5.  Pt will be able to don and doff appropriate compression garments and devices using assistive devices and extra time within 1 week of issue date for optimal lymphedema self-care. Baseline: Dependent Goal status: 01/25/23 GOAL MET   PLAN:  PT FREQUENCY: 2x/week  PT DURATION: 12  weeks other:  and PRN  PLANNED INTERVENTIONS: Therapeutic exercises, Therapeutic activity, Patient/Family education, Self Care, DME instructions, Manual lymph drainage, Compression bandaging, Manual therapy, and skin care during MLD, fit with appropriate custom compression garments and devices, fit with appropriate compression device  which  follows lymphatic pathways and anatomic distribution  PLAN FOR NEXT SESSION:  MLD Compression Skin care Pt/ family edu for LE self care   Loel Dubonnet, MS, OTR/L, CLT-LANA 05/04/23 4:05 PM

## 2023-05-09 ENCOUNTER — Ambulatory Visit: Payer: 59 | Admitting: Occupational Therapy

## 2023-05-09 DIAGNOSIS — I890 Lymphedema, not elsewhere classified: Secondary | ICD-10-CM

## 2023-05-09 NOTE — Therapy (Signed)
OUTPATIENT OCCUPATIONAL THERAPY TREATMENT NOTE LOWER EXTREMITY LYMPHEDEMA  Patient Name: Mary [name] MRN: [id number] DOB:October 29, 1960, 62 y.o., female Today's Date: 05/09/2023   END OF SESSION:  OT End of Session - 05/09/23 1509     Visit Number 51    Number of Visits 72    Date for OT Re-Evaluation 07/26/23    OT Start Time 0305    OT Stop Time 0400    OT Time Calculation (min) 55 min    Activity Tolerance Patient tolerated treatment well;No increased pain    Behavior During Therapy WFL for tasks assessed/performed                 Past Medical History:  Diagnosis Date   Anemia    low iron at times   Arthritis    Colon polyps    Complication of anesthesia 1991   Pt reports she awoke during plantar fascia surgery and felt "everything"   Heart murmur    " small per patient"    Hyperlipidemia    Hypertension    Hypothyroidism    Lactose intolerance    PAC (premature atrial contraction)    Palpitations    Panic attacks    Pt reports restrictive clothing/areas and dry mouth cause her to have attack.   Peripheral vascular disease (HCC)    "small" clot after thermal ablasion   Pneumonia 2016   "walking pneumonia" 3 times in 2016   Shortness of breath    going upstairs (due to weight). not so much since weight loss   Sleep apnea    after weight loss does not have to use cpap   Vitamin B12 deficiency    Vitamin D deficiency    Past Surgical History:  Procedure Laterality Date   APPENDECTOMY  1985   CESAREAN SECTION  1987   twins   COLONOSCOPY     COLONOSCOPY WITH PROPOFOL N/A 02/27/2018   Procedure: COLONOSCOPY WITH PROPOFOL;  Surgeon: Toledo, Boykin Nearing, MD;  Location: [facility];  Service: Gastroenterology;  Laterality: N/A;   IRRIGATION AND DEBRIDEMENT HEMATOMA Right 11/12/2015   Procedure: IRRIGATION AND DEBRIDEMENT HEMATOMA;  Surgeon: Earline Mayotte, MD;  Location: [facility];  Service: General;  Laterality: Right;   KNEE ARTHROSCOPY WITH MEDIAL  MENISECTOMY Left 07/15/2020   Procedure: Left knee arthroscopy with partial medial or lateral menisectomy, possible chondroplasty, possible partial synovectomy;  Surgeon: Lyndle Herrlich, MD;  Location: [facility];  Service: Orthopedics;  Laterality: Left;   LAMINECTOMY  07/10/2013   L4   L5   DR Yetta Barre   LAPAROSCOPIC SALPINGO OOPHERECTOMY Right 04/17/2014   Procedure: LAPAROSCOPIC SALPINGO OOPHORECTOMY RIGHT;  Surgeon: Adolphus Birchwood, MD;  Location: [facility];  Service: Gynecology;  Laterality: Right;   LYSIS OF ADHESION  04/17/2014   Procedure: LYSIS OF ADHESION;  Surgeon: Adolphus Birchwood, MD;  Location: [facility];  Service: Gynecology;;   MAXIMUM ACCESS (MAS)POSTERIOR LUMBAR INTERBODY FUSION (PLIF) 1 LEVEL  07/10/2013   Procedure: FOR MAXIMUM ACCESS (MAS) POSTERIOR LUMBAR INTERBODY FUSION (PLIF) LUMBAR FOUR-FIVE;  Surgeon: Tia Alert, MD;  Location: [facility];  Service: Neurosurgery;;  FOR MAXIMUM ACCESS (MAS) POSTERIOR LUMBAR INTERBODY FUSION (PLIF) LUMBAR FOUR-FIVE   OOPHORECTOMY     LSO   OPEN REDUCTION INTERNAL FIXATION (ORIF) DISTAL RADIAL FRACTURE Left 09/20/2018   Procedure: OPEN REDUCTION INTERNAL FIXATION (ORIF) DISTAL RADIAL FRACTURE, LEFT;  Surgeon: Kennedy Bucker, MD;  Location: [facility];  Service: Orthopedics;  Laterality: Left;   PLANTAR FASCIA SURGERY  1999   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   VAGINAL HYSTERECTOMY  2001   for bleeding and LSO for cyst   Patient Active Problem List   Diagnosis Date Noted   Reactive hypoglycemia 02/16/2022   Prediabetes 02/16/2022   Chronic venous insufficiency 12/23/2021   Lymphedema 12/13/2021   Panic attacks    Strain of knee 12/24/2019   Vitamin B12 deficiency 10/08/2019   Arthritis 07/16/2019   Edema 07/16/2019   History of abdominal pain 07/16/2019   Low back pain 07/16/2019   Vitamin D deficiency 07/16/2019   Hypothyroidism 02/28/2019   History of gastric bypass 02/28/2019   Obstructive sleep apnea syndrome 02/28/2019   Insomnia 02/28/2019    Class 3 severe obesity due to excess calories with serious comorbidity and body mass index (BMI) of 40.0 to 44.9 in adult (HCC) 02/28/2019   Elevated BP without diagnosis of hypertension 02/28/2019   Mixed hyperlipidemia 02/28/2019   Hot flashes 02/28/2019   BMI 40.0-44.9, adult (HCC) 12/16/2015   Hematoma of lower extremity 11/12/2015   Essential hypertension 06/20/2014   Pure hypercholesterolemia 06/20/2014   S/P lumbar spinal fusion 07/10/2013   Arthrodesis status 07/10/2013   Palpitations 01/02/2012   Edema of both legs 01/02/2012    PCP: Joni Reining, PA-C  REFERRING PROVIDER: Foster Simpson, DO  REFERRING DIAG: I89.0  THERAPY DIAG:  Lymphedema, not elsewhere classified  Rationale for Evaluation and Treatment: Rehabilitation  ONSET DATE: Insidious onset with progression over time. Greater than 20 years  SUBJECTIVE:                                                                                                                                                                                          SUBJECTIVE STATEMENT:Mary [name] presents to OT visit to address BLE lipo-lymphedema. Pt is accompanied by her supportive spouse, Mary [name]. Pt denies LE-related pain in her legs. Pt has no new concerns.We discussed Pt calling DME vendor to check status on her custom compression garments. Vendor did not respond to her call. Pt denies LE-related leg pain.  PERTINENT HISTORY and contributing factors include, OA, HTN, Panic attacks 2/2 restrictive clothing, OSA (not using CPAP), s/p hysterectomy 1987, L knee sx, oophorectomy, s/p hysterectomy 2001, Gastric Bypass 2007, s/p plantar fascia sx 1999, obesity, insomnia  PAIN:  Are you having LE-related pain? No: NPRS scale: 0/10.  Pain location: BLE- generalized, knees Pain description: discomfort, heavy, tired, full, sore Aggravating factors: standing, walking, extended dependent sitting Relieving factors: elevation  PRECAUTIONS:  Other: LYMPHEDEMA PRECAUTIONS: HYPOTHYROID, DIABETES SKIN PRECAUTIONS  WEIGHT BEARING RESTRICTIONS: No  FALLS:  Has patient fallen since last visit? 04/05/23 YES  Pt had a  fall in the middle of the night. She states she got up to go to the bathroom and when rounding the bottom of the bed she passed out and woke up on the floor. Pt was able to get herself up . She is sore today, but uninjured by report. She was wearing the portable heart monitor at the time.   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  BLE COMPARATIVE LIMB VOLUMETRICS:  INTAKE: 11/01/22  LANDMARK RIGHT    R LEG (A-D) 5947.2 ml  R THIGH (E-G) 9750.0 ml  R FULL LIMB (A-G) 15697.2 ml  Limb Volume differential (LVD)  %  Volume change since initial %  Volume change overall V  (Blank rows = not tested)  LANDMARK LEFT   L LEG (A-D) 6011.6 ml  L THIGH (E-G) 9602.5 ml  L FULL LIMB (A-G) 15614.1 ml  Limb Volume differential (LVD)  LEG LVD = 1.8%, L>R % THIGH LVD = 1.5%, R>L FULL LIMB LVD = 0.53%, R>L  Volume change since initial %  Volume change overall %  (Blank rows = not tested)   9th VISIT 11/24/22  LANDMARK LEFT   L LEG (A-D) 5918.6 ml  L THIGH (E-G) 9265.5 ml  L FULL LIMB (A-G) 15184.1  ml  Limb Volume differential (LVD)  LEG volume = Decreased 1.5%;  THIGH volume decreased 3.5 %, AND  L FULL LIMB VOLUME dec BY 2.8 % SINCE 11/01/22  Volume change since initial %  Volume change overall %    20 th VISIT 01/11/23  LANDMARK LEFT   L LEG (A-D) 5181.2 ml  L THIGH (E-G)  ml  L FULL LIMB (A-G)  ml  Limb Volume differential (LVD)  L LEG (A-D) volume = Decreased 12.4% since 11/24/22. 10% GOAL MET. Did  not measure L thigh or full limb today since we have not been bandaging above the knee.   Volume change since initial L LEG (A-D) volume reduction since commencing CDT on 09/02/22 measures 13.8% 10% GOAL MET.   30 th VISIT 01/11/23  LANDMARK RLE  L LEG (A-D) 5691.5  ml  L THIGH (E-G)  ml  L FULL LIMB (A-G)  ml  Limb Volume since initial 11/01/22 DECREASED BY 4.3%  Volume change overall    39 th VISIT 03/28/23  LANDMARK RLE  L LEG (A-D) 5632.0 ml  L THIGH (E-G)  ml  L FULL LIMB (A-G)  ml  Limb Volume since initial 11/01/22 DECREASED BY 1.05 % since last measured on 02/20/23; and DECREASED by 5.4%  since  09/02/22  Volume change overall      50 th VISIT   LANDMARK RLE  R LEG (A-D) 5879.6 ml  RTHIGH (E-G)  ml  R FULL LIMB (A-G)  ml  Limb Volume since initial 03/28/23 INC 4.4%  Volume change overall    FOTO functional outcome score:      Lymphedema Life Impact Scale (LLIS):   Date FOTO LLIS  Initial 10/13/22 56%   Final 04/12/23 55%    Initial 10/13/22  36.76%  Final 04/12/23  25% ( 11.76% reduction)    TODAY'S TREATMENT:     RLE/RLQ MLD  RLE multilayer , knee length gradient compression  bandaging Pt/ FAMILY EDU   PATIENT EDUCATION:  Continued Pt/ CG edu for lymphedema self care home program throughout session. Topics include outcome of comparative limb volumetrics- starting vs changing limb volumes over time; technology and gradient techniques used for short stretch, multilayer compression wrapping, simple self-MLD, therapeutic lymphatic pumping exercises, skin/nail care, LE precautions,. compression garment recommendations and specifications, donning and doffing gadgets, wear and care schedule and compression garment donning / doffing w assistive devices. Discussed progress towards all OT goals since commencing CDT. All questions answered to the Pt's satisfaction w comfort and fit. Good return. Person educated: Patient and Spouse Education method: Explanation, Demonstration, and  Handouts Education comprehension: verbalized understanding, returned demonstration, verbal cues required, and needs further education   HOME EXERCISE PROGRAM: Lymphatic Pumping Therex-  1 set of 10, 2 x daily, in order, seated  Daily skin inspection and care with low ph lotion matching skin ph to limit infection risk Simple Self-MLD 1 x daily During Intensive Phase CDT: multilayer compression wraps from foot to groin using gradient techniques, one limb at a time  to ensure safety During Self -Management Phase CDT: Fit with custom daytime compression garments: Elvarex, custom, flat knit, ccl 2 ( 23-32 mmHg) knee highs. Consider pairing with Compreshorts Capri length pantyhose for containment of buttocks, thighs and abdomen.  Fit with BLE knee length Jobst RELAX w/ open toe and zipper to limit ongoing tissue fibrosis formation and facilitate increased lymphatic function during HOS  Custom-made gradient compression garments and HOS devices are medically necessary in this case because they are uniquely sized and shaped to fit the exact dimensions of the affected extremities, and to provide accurate and consistent gradient compression essential for optimally managing this patient's symptoms of chronic, progressive lymphedema. Multiple custom compression garments are needed for optimal hygiene. Custom compression garments should be replaced q 3-6 months When worn consistently for optimal effectiveness.  ASSESSMENT: Comparative limb volumetrics reveal 4.4% increase since last measured. This is likely a fluctuation.Pt continues to make steady progress towards all OT goals. Please review GOALS section for detailed assessments. Continued MLD to RLE/RLQ today with emphasis on ankle and distal leg fibrosis. Pt denied pain with manual therapy. Reapplied compression wraps as established. We'll fit R custom stocking ASAP.  Cont 2 x weekly as per POC until garment fitting is complete.  OBJECTIVE IMPAIRMENTS: Abnormal  gait, decreased activity tolerance, decreased balance, decreased knowledge of condition, decreased knowledge of use of DME, decreased mobility, difficulty walking, decreased ROM, increased edema, impaired sensation, obesity, pain, and chronic , progressive swelling and pain of buttocks, hips, abdomen and legs .   ACTIVITY LIMITATIONS: carrying, lifting, bending, sitting, standing, squatting, sleeping, stairs, transfers, bed mobility, bathing, dressing, hygiene/grooming, caring for others, and leisure pursuits, social participation, productive/ work activities  PARTICIPATION LIMITATIONS: meal prep, cleaning, laundry, interpersonal relationship, driving, shopping, community activity, occupation, yard work, and church  PERSONAL FACTORS: Age, Past/current experiences, hx of panic attacks and claustrophobia, Time since onset of injury/illness/exacerbation,, motivation, supportive spouse and family are also affecting patient's functional outcome.   REHAB POTENTIAL: Fair Unfortunately the Lipedema does not respond to CDT and is unchanged by this protocol. Fortunately lymphedema  does respond to differing degrees based on the quality and amount of fatty fibrosis present obstructing lymphatics, and patient's tolerance if skin is hypersensitive.   EVALUATION COMPLEXITY: Moderate   GOALS: Goals reviewed with patient? Yes  SHORT TERM GOALS: Target date: 4th OT Rx visit  Pt will demonstrate understanding of lymphedema precautions and prevention strategies with modified independence using a printed reference to identify at least 5 precautions and discussing how s/he may implement them into daily life to reduce risk of progression with modified assistance ( printed reference). Baseline: Max A Goal status: 12/12/22 PROGRESSING 01/11/23 GOAL MET  2.  Pt will be able to apply multilayer, thigh length, compression wraps using gradient techniques with Max caregiver assistance to decrease limb volume, to limit  infection risk, and to limit lymphedema progression.  Baseline: Dependent Goal status: 11/10/22 Goal Met  LONG TERM GOALS: Target date: 04/24/23  Given this patient's Intake score of 36.76 % on the Lymphedema Life Impact Scale (LLIS), patient will experience a reduction of at least 5 points in her perceived level of functional impairment resulting from lymphedema to improve functional performance and quality of life (QOL). Baseline: 36.76% Goal status: 02/20/23 PROGRESSING  2.  Given this patient's Intake score of 56/100% on the functional outcomes FOTO tool, patient will experience an increase in function of 3 points to improve basic and instrumental ADLs performance, including lymphedema self-care.  Baseline: 56% Goal status: 02/20/23 PROGRESSING  3.  During Intensive phase CDT Pt will achieve at least 85% compliance with all lymphedema self-care home program components, including  daily skin care, multilayer , gradient compression wraps with daily changes, daily simple self MLD and daily lymphatic pumping therex to achieve optimal clinical outcome and to habituate self care regime for optimal LE self-management over time. Baseline: Dependent Goal status: 01/09/23 GOAL MET  4.  Pt will achieve at least a 10% volume reductions bilaterally below the knees to return limb to more typical size and shape, to limit infection risk and LE progression, to decrease pain, to improve function, and to improve body image and QOL. Baseline: Dependent Goal status: 12/12/22 PROGRESSING. 01/11/23 GOAL MET for L LEG  03/28/23 Volumetrics reveal that the R leg is DECREASED in volume BY 1.05 % since last measured on 02/20/23; and DECREASED by 5.4% since  09/02/22.   5.  Pt will be able to don and doff appropriate compression garments and devices using assistive devices and extra time within 1 week of issue date for optimal lymphedema self-care. Baseline: Dependent Goal status: 01/25/23 GOAL MET   PLAN:  PT FREQUENCY:  2x/week  PT DURATION: 12  weeks other: and PRN  PLANNED INTERVENTIONS: Therapeutic exercises, Therapeutic activity, Patient/Family education, Self Care, DME instructions, Manual lymph drainage, Compression bandaging, Manual therapy, and skin care during MLD, fit with appropriate custom compression garments and devices, fit with appropriate compression device  which  follows lymphatic pathways and anatomic distribution  PLAN FOR NEXT SESSION:  MLD Compression Skin care Pt/ family edu for LE self care   Loel Dubonnet, MS, OTR/L, CLT-LANA 05/09/23 4:05 PM

## 2023-05-11 ENCOUNTER — Encounter: Payer: 59 | Admitting: Occupational Therapy

## 2023-05-16 ENCOUNTER — Encounter: Payer: Self-pay | Admitting: Occupational Therapy

## 2023-05-16 ENCOUNTER — Other Ambulatory Visit: Payer: Self-pay | Admitting: Cardiology

## 2023-05-16 ENCOUNTER — Ambulatory Visit: Payer: 59 | Attending: Plastic Surgery | Admitting: Occupational Therapy

## 2023-05-16 DIAGNOSIS — I890 Lymphedema, not elsewhere classified: Secondary | ICD-10-CM | POA: Diagnosis present

## 2023-05-16 DIAGNOSIS — R9439 Abnormal result of other cardiovascular function study: Secondary | ICD-10-CM

## 2023-05-16 DIAGNOSIS — I471 Supraventricular tachycardia, unspecified: Secondary | ICD-10-CM

## 2023-05-16 DIAGNOSIS — I4729 Other ventricular tachycardia: Secondary | ICD-10-CM

## 2023-05-16 DIAGNOSIS — E7800 Pure hypercholesterolemia, unspecified: Secondary | ICD-10-CM

## 2023-05-16 NOTE — Therapy (Signed)
OUTPATIENT OCCUPATIONAL THERAPY TREATMENT NOTE LOWER EXTREMITY LYMPHEDEMA  Patient Name: Mary [name] MRN: [id number] DOB:26-Apr-1961, 62 y.o., female Today's Date: 05/16/2023   END OF SESSION:  OT End of Session - 05/16/23 1509     Visit Number 52    Number of Visits 72    Date for OT Re-Evaluation 07/26/23    OT Start Time 0305    OT Stop Time 0400    OT Time Calculation (min) 55 min    Activity Tolerance Patient tolerated treatment well;No increased pain    Behavior During Therapy WFL for tasks assessed/performed                 Past Medical History:  Diagnosis Date   Anemia    low iron at times   Arthritis    Colon polyps    Complication of anesthesia 1991   Pt reports she awoke during plantar fascia surgery and felt "everything"   Heart murmur    " small per patient"    Hyperlipidemia    Hypertension    Hypothyroidism    Lactose intolerance    PAC (premature atrial contraction)    Palpitations    Panic attacks    Pt reports restrictive clothing/areas and dry mouth cause her to have attack.   Peripheral vascular disease (HCC)    "small" clot after thermal ablasion   Pneumonia 2016   "walking pneumonia" 3 times in 2016   Shortness of breath    going upstairs (due to weight). not so much since weight loss   Sleep apnea    after weight loss does not have to use cpap   Vitamin B12 deficiency    Vitamin D deficiency    Past Surgical History:  Procedure Laterality Date   APPENDECTOMY  1985   CESAREAN SECTION  1987   twins   COLONOSCOPY     COLONOSCOPY WITH PROPOFOL N/A 02/27/2018   Procedure: COLONOSCOPY WITH PROPOFOL;  Surgeon: Toledo, Boykin Nearing, MD;  Location: [facility];  Service: Gastroenterology;  Laterality: N/A;   IRRIGATION AND DEBRIDEMENT HEMATOMA Right 11/12/2015   Procedure: IRRIGATION AND DEBRIDEMENT HEMATOMA;  Surgeon: Earline Mayotte, MD;  Location: [facility];  Service: General;  Laterality: Right;   KNEE ARTHROSCOPY WITH MEDIAL  MENISECTOMY Left 07/15/2020   Procedure: Left knee arthroscopy with partial medial or lateral menisectomy, possible chondroplasty, possible partial synovectomy;  Surgeon: Lyndle Herrlich, MD;  Location: [facility];  Service: Orthopedics;  Laterality: Left;   LAMINECTOMY  07/10/2013   L4   L5   DR Yetta Barre   LAPAROSCOPIC SALPINGO OOPHERECTOMY Right 04/17/2014   Procedure: LAPAROSCOPIC SALPINGO OOPHORECTOMY RIGHT;  Surgeon: Adolphus Birchwood, MD;  Location: [facility];  Service: Gynecology;  Laterality: Right;   LYSIS OF ADHESION  04/17/2014   Procedure: LYSIS OF ADHESION;  Surgeon: Adolphus Birchwood, MD;  Location: [facility];  Service: Gynecology;;   MAXIMUM ACCESS (MAS)POSTERIOR LUMBAR INTERBODY FUSION (PLIF) 1 LEVEL  07/10/2013   Procedure: FOR MAXIMUM ACCESS (MAS) POSTERIOR LUMBAR INTERBODY FUSION (PLIF) LUMBAR FOUR-FIVE;  Surgeon: Tia Alert, MD;  Location: [facility];  Service: Neurosurgery;;  FOR MAXIMUM ACCESS (MAS) POSTERIOR LUMBAR INTERBODY FUSION (PLIF) LUMBAR FOUR-FIVE   OOPHORECTOMY     LSO   OPEN REDUCTION INTERNAL FIXATION (ORIF) DISTAL RADIAL FRACTURE Left 09/20/2018   Procedure: OPEN REDUCTION INTERNAL FIXATION (ORIF) DISTAL RADIAL FRACTURE, LEFT;  Surgeon: Kennedy Bucker, MD;  Location: [facility];  Service: Orthopedics;  Laterality: Left;   PLANTAR FASCIA SURGERY  1999   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   VAGINAL HYSTERECTOMY  2001   for bleeding and LSO for cyst   Patient Active Problem List   Diagnosis Date Noted   Reactive hypoglycemia 02/16/2022   Prediabetes 02/16/2022   Chronic venous insufficiency 12/23/2021   Lymphedema 12/13/2021   Panic attacks    Strain of knee 12/24/2019   Vitamin B12 deficiency 10/08/2019   Arthritis 07/16/2019   Edema 07/16/2019   History of abdominal pain 07/16/2019   Low back pain 07/16/2019   Vitamin D deficiency 07/16/2019   Hypothyroidism 02/28/2019   History of gastric bypass 02/28/2019   Obstructive sleep apnea syndrome 02/28/2019   Insomnia 02/28/2019    Class 3 severe obesity due to excess calories with serious comorbidity and body mass index (BMI) of 40.0 to 44.9 in adult (HCC) 02/28/2019   Elevated BP without diagnosis of hypertension 02/28/2019   Mixed hyperlipidemia 02/28/2019   Hot flashes 02/28/2019   BMI 40.0-44.9, adult (HCC) 12/16/2015   Hematoma of lower extremity 11/12/2015   Essential hypertension 06/20/2014   Pure hypercholesterolemia 06/20/2014   S/P lumbar spinal fusion 07/10/2013   Arthrodesis status 07/10/2013   Palpitations 01/02/2012   Edema of both legs 01/02/2012    PCP: Joni Reining, PA-C  REFERRING PROVIDER: Foster Simpson, DO  REFERRING DIAG: I89.0  THERAPY DIAG:  Lymphedema, not elsewhere classified  Rationale for Evaluation and Treatment: Rehabilitation  ONSET DATE: Insidious onset with progression over time. Greater than 20 years  SUBJECTIVE:                                                                                                                                                                                          SUBJECTIVE STATEMENT:Mary [name] presents to OT visit to address BLE lipo-lymphedema. Pt is accompanied by her supportive spouse, Mary [name]. Pt denies LE-related pain in her legs. Pt has no new concerns.We discussed Pt calling DME vendor to check status on her custom compression garments. Vendor did not respond to her call. Pt denies LE-related leg pain.  PERTINENT HISTORY and contributing factors include, OA, HTN, Panic attacks 2/2 restrictive clothing, OSA (not using CPAP), s/p hysterectomy 1987, L knee sx, oophorectomy, s/p hysterectomy 2001, Gastric Bypass 2007, s/p plantar fascia sx 1999, obesity, insomnia  PAIN:  Are you having LE-related pain? No: NPRS scale: 0/10.  Pain location: BLE- generalized, knees Pain description: discomfort, heavy, tired, full, sore Aggravating factors: standing, walking, extended dependent sitting Relieving factors: elevation  PRECAUTIONS:  Other: LYMPHEDEMA PRECAUTIONS: HYPOTHYROID, DIABETES SKIN PRECAUTIONS  WEIGHT BEARING RESTRICTIONS: No  FALLS:  Has patient fallen since last visit? 04/05/23 YES  Pt had a  fall in the middle of the night. She states she got up to go to the bathroom and when rounding the bottom of the bed she passed out and woke up on the floor. Pt was able to get herself up . She is sore today, but uninjured by report. She was wearing the portable heart monitor at the time.   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  BLE COMPARATIVE LIMB VOLUMETRICS:  INTAKE: 11/01/22  LANDMARK RIGHT    R LEG (A-D) 5947.2 ml  R THIGH (E-G) 9750.0 ml  R FULL LIMB (A-G) 15697.2 ml  Limb Volume differential (LVD)  %  Volume change since initial %  Volume change overall V  (Blank rows = not tested)  LANDMARK LEFT   L LEG (A-D) 6011.6 ml  L THIGH (E-G) 9602.5 ml  L FULL LIMB (A-G) 15614.1 ml  Limb Volume differential (LVD)  LEG LVD = 1.8%, L>R % THIGH LVD = 1.5%, R>L FULL LIMB LVD = 0.53%, R>L  Volume change since initial %  Volume change overall %  (Blank rows = not tested)   9th VISIT 11/24/22  LANDMARK LEFT   L LEG (A-D) 5918.6 ml  L THIGH (E-G) 9265.5 ml  L FULL LIMB (A-G) 15184.1  ml  Limb Volume differential (LVD)  LEG volume = Decreased 1.5%;  THIGH volume decreased 3.5 %, AND  L FULL LIMB VOLUME dec BY 2.8 % SINCE 11/01/22  Volume change since initial %  Volume change overall %    20 th VISIT 01/11/23  LANDMARK LEFT   L LEG (A-D) 5181.2 ml  L THIGH (E-G)  ml  L FULL LIMB (A-G)  ml  Limb Volume differential (LVD)  L LEG (A-D) volume = Decreased 12.4% since 11/24/22. 10% GOAL MET. Did  not measure L thigh or full limb today since we have not been bandaging above the knee.   Volume change since initial L LEG (A-D) volume reduction since commencing CDT on 09/02/22 measures 13.8% 10% GOAL MET.   30 th VISIT 01/11/23  LANDMARK RLE  L LEG (A-D) 5691.5  ml  L THIGH (E-G)  ml  L FULL LIMB (A-G)  ml  Limb Volume since initial 11/01/22 DECREASED BY 4.3%  Volume change overall    39 th VISIT 03/28/23  LANDMARK RLE  L LEG (A-D) 5632.0 ml  L THIGH (E-G)  ml  L FULL LIMB (A-G)  ml  Limb Volume since initial 11/01/22 DECREASED BY 1.05 % since last measured on 02/20/23; and DECREASED by 5.4%  since  09/02/22  Volume change overall      50 th VISIT   LANDMARK RLE  R LEG (A-D) 5879.6 ml  R THIGH (E-G)  ml  R FULL LIMB (A-G)  ml  Limb Volume since initial 03/28/23 INC 4.4%  Volume change overall    FOTO functional outcome score:      Lymphedema Life Impact Scale (LLIS):   Date FOTO LLIS  Initial 10/13/22 56%   Final 04/12/23 55%    Initial 10/13/22  36.76%  Final 04/12/23  25% ( 11.76% reduction)    TODAY'S TREATMENT:     RLE/RLQ MLD  RLE multilayer , knee length gradient compression  bandaging Pt/ FAMILY EDU   PATIENT EDUCATION:  Continued Pt/ CG edu for lymphedema self care home program throughout session. Topics include outcome of comparative limb volumetrics- starting vs changing limb volumes over time; technology and gradient techniques used for short stretch, multilayer compression wrapping, simple self-MLD, therapeutic lymphatic pumping exercises, skin/nail care, LE precautions,. compression garment recommendations and specifications, donning and doffing gadgets, wear and care schedule and compression garment donning / doffing w assistive devices. Discussed progress towards all OT goals since commencing CDT. All questions answered to the Pt's satisfaction w comfort and fit. Good return. Person educated: Patient and Spouse Education method: Explanation, Demonstration, and  Handouts Education comprehension: verbalized understanding, returned demonstration, verbal cues required, and needs further education   HOME EXERCISE PROGRAM: Lymphatic Pumping Therex-  1 set of 10, 2 x daily, in order, seated  Daily skin inspection and care with low ph lotion matching skin ph to limit infection risk Simple Self-MLD 1 x daily During Intensive Phase CDT: multilayer compression wraps from foot to groin using gradient techniques, one limb at a time  to ensure safety During Self -Management Phase CDT: Fit with custom daytime compression garments: Elvarex, custom, flat knit, ccl 2 ( 23-32 mmHg) knee highs. Consider pairing with Compreshorts Capri length pantyhose for containment of buttocks, thighs and abdomen.  Fit with BLE knee length Jobst RELAX w/ open toe and zipper to limit ongoing tissue fibrosis formation and facilitate increased lymphatic function during HOS  Custom-made gradient compression garments and HOS devices are medically necessary in this case because they are uniquely sized and shaped to fit the exact dimensions of the affected extremities, and to provide accurate and consistent gradient compression essential for optimally managing this patient's symptoms of chronic, progressive lymphedema. Multiple custom compression garments are needed for optimal hygiene. Custom compression garments should be replaced q 3-6 months When worn consistently for optimal effectiveness.  ASSESSMENT: Pt tolerated MLD and simultaneous skin care to RLE without increased pain or other difficulty. Reapplied gradient compression wraps as established. Briefly discussed option for off the shelf compression to use on upcoming trip  in lieu of bandaging. Next session we'll explore that further since DME vendor has not contacted Pt in a timely way. Cont as per POC. Once custom garment fitting is complete  consider trial with advanced lymphedema device   to assist with ongoing self care over  time.  OBJECTIVE IMPAIRMENTS: Abnormal gait, decreased activity tolerance, decreased balance, decreased knowledge of condition, decreased knowledge of use of DME, decreased mobility, difficulty walking, decreased ROM, increased edema, impaired sensation, obesity, pain, and chronic , progressive swelling and pain of buttocks, hips, abdomen and legs .   ACTIVITY LIMITATIONS: carrying, lifting, bending, sitting, standing, squatting, sleeping, stairs, transfers, bed mobility, bathing, dressing, hygiene/grooming, caring for others, and leisure pursuits, social participation, productive/ work activities  PARTICIPATION LIMITATIONS: meal prep, cleaning, laundry, interpersonal relationship, driving, shopping, community activity, occupation, yard work, and church  PERSONAL FACTORS: Age, Past/current experiences, hx of panic attacks and claustrophobia, Time since onset of injury/illness/exacerbation,, motivation, supportive spouse and family are also affecting patient's functional outcome.   REHAB POTENTIAL: Fair Unfortunately the Lipedema does not respond to CDT and is unchanged by this protocol. Fortunately lymphedema  does respond to differing degrees based on the quality and amount of fatty fibrosis present obstructing lymphatics, and patient's tolerance if skin is hypersensitive.   EVALUATION COMPLEXITY: Moderate   GOALS: Goals reviewed with patient? Yes  SHORT  TERM GOALS: Target date: 4th OT Rx visit   Pt will demonstrate understanding of lymphedema precautions and prevention strategies with modified independence using a printed reference to identify at least 5 precautions and discussing how s/he may implement them into daily life to reduce risk of progression with modified assistance ( printed reference). Baseline: Max A Goal status: 12/12/22 PROGRESSING 01/11/23 GOAL MET  2.  Pt will be able to apply multilayer, thigh length, compression wraps using gradient techniques with Max caregiver assistance  to decrease limb volume, to limit infection risk, and to limit lymphedema progression.  Baseline: Dependent Goal status: 11/10/22 Goal Met  LONG TERM GOALS: Target date: 04/24/23  Given this patient's Intake score of 36.76 % on the Lymphedema Life Impact Scale (LLIS), patient will experience a reduction of at least 5 points in her perceived level of functional impairment resulting from lymphedema to improve functional performance and quality of life (QOL). Baseline: 36.76% Goal status: 02/20/23 PROGRESSING  2.  Given this patient's Intake score of 56/100% on the functional outcomes FOTO tool, patient will experience an increase in function of 3 points to improve basic and instrumental ADLs performance, including lymphedema self-care.  Baseline: 56% Goal status: 02/20/23 PROGRESSING  3.  During Intensive phase CDT Pt will achieve at least 85% compliance with all lymphedema self-care home program components, including  daily skin care, multilayer , gradient compression wraps with daily changes, daily simple self MLD and daily lymphatic pumping therex to achieve optimal clinical outcome and to habituate self care regime for optimal LE self-management over time. Baseline: Dependent Goal status: 01/09/23 GOAL MET  4.  Pt will achieve at least a 10% volume reductions bilaterally below the knees to return limb to more typical size and shape, to limit infection risk and LE progression, to decrease pain, to improve function, and to improve body image and QOL. Baseline: Dependent Goal status: 12/12/22 PROGRESSING. 01/11/23 GOAL MET for L LEG  03/28/23 Volumetrics reveal that the R leg is DECREASED in volume BY 1.05 % since last measured on 02/20/23; and DECREASED by 5.4% since  09/02/22.   5.  Pt will be able to don and doff appropriate compression garments and devices using assistive devices and extra time within 1 week of issue date for optimal lymphedema self-care. Baseline: Dependent Goal status: 01/25/23 GOAL  MET   PLAN:  PT FREQUENCY: 2x/week  PT DURATION: 12  weeks other: and PRN  PLANNED INTERVENTIONS: Therapeutic exercises, Therapeutic activity, Patient/Family education, Self Care, DME instructions, Manual lymph drainage, Compression bandaging, Manual therapy, and skin care during MLD, fit with appropriate custom compression garments and devices, fit with appropriate compression device  which  follows lymphatic pathways and anatomic distribution  PLAN FOR NEXT SESSION:  MLD Compression Skin care Pt/ family edu for LE self care   Loel Dubonnet, MS, OTR/L, CLT-LANA 05/16/23 3:56 PM

## 2023-05-18 ENCOUNTER — Other Ambulatory Visit: Payer: Self-pay | Admitting: Physician Assistant

## 2023-05-18 ENCOUNTER — Ambulatory Visit: Payer: 59 | Admitting: Occupational Therapy

## 2023-05-18 DIAGNOSIS — F411 Generalized anxiety disorder: Secondary | ICD-10-CM

## 2023-05-18 DIAGNOSIS — I890 Lymphedema, not elsewhere classified: Secondary | ICD-10-CM | POA: Diagnosis not present

## 2023-05-18 NOTE — Therapy (Signed)
OUTPATIENT OCCUPATIONAL THERAPY TREATMENT NOTE LOWER EXTREMITY LYMPHEDEMA  Patient Name: COCO SHARPNACK MRN: [id number] DOB:1961-06-11, 62 y.o., female Today's Date: 05/18/2023   END OF SESSION:  OT End of Session - 05/18/23 1603     Visit Number 53    Number of Visits 72    Date for OT Re-Evaluation 07/26/23    OT Start Time 0300    OT Stop Time 0400    OT Time Calculation (min) 60 min    Activity Tolerance Patient tolerated treatment well;No increased pain    Behavior During Therapy WFL for tasks assessed/performed                 Past Medical History:  Diagnosis Date   Anemia    low iron at times   Arthritis    Colon polyps    Complication of anesthesia 1991   Pt reports she awoke during plantar fascia surgery and felt "everything"   Heart murmur    " small per patient"    Hyperlipidemia    Hypertension    Hypothyroidism    Lactose intolerance    PAC (premature atrial contraction)    Palpitations    Panic attacks    Pt reports restrictive clothing/areas and dry mouth cause her to have attack.   Peripheral vascular disease (HCC)    "small" clot after thermal ablasion   Pneumonia 2016   "walking pneumonia" 3 times in 2016   Shortness of breath    going upstairs (due to weight). not so much since weight loss   Sleep apnea    after weight loss does not have to use cpap   Vitamin B12 deficiency    Vitamin D deficiency    Past Surgical History:  Procedure Laterality Date   APPENDECTOMY  1985   CESAREAN SECTION  1987   twins   COLONOSCOPY     COLONOSCOPY WITH PROPOFOL N/A 02/27/2018   Procedure: COLONOSCOPY WITH PROPOFOL;  Surgeon: Toledo, Boykin Nearing, MD;  Location: [facility];  Service: Gastroenterology;  Laterality: N/A;   IRRIGATION AND DEBRIDEMENT HEMATOMA Right 11/12/2015   Procedure: IRRIGATION AND DEBRIDEMENT HEMATOMA;  Surgeon: Earline Mayotte, MD;  Location: [facility];  Service: General;  Laterality: Right;   KNEE ARTHROSCOPY WITH MEDIAL  MENISECTOMY Left 07/15/2020   Procedure: Left knee arthroscopy with partial medial or lateral menisectomy, possible chondroplasty, possible partial synovectomy;  Surgeon: Lyndle Herrlich, MD;  Location: [facility];  Service: Orthopedics;  Laterality: Left;   LAMINECTOMY  07/10/2013   L4   L5   DR Yetta Barre   LAPAROSCOPIC SALPINGO OOPHERECTOMY Right 04/17/2014   Procedure: LAPAROSCOPIC SALPINGO OOPHORECTOMY RIGHT;  Surgeon: Adolphus Birchwood, MD;  Location: [facility];  Service: Gynecology;  Laterality: Right;   LYSIS OF ADHESION  04/17/2014   Procedure: LYSIS OF ADHESION;  Surgeon: Adolphus Birchwood, MD;  Location: [facility];  Service: Gynecology;;   MAXIMUM ACCESS (MAS)POSTERIOR LUMBAR INTERBODY FUSION (PLIF) 1 LEVEL  07/10/2013   Procedure: FOR MAXIMUM ACCESS (MAS) POSTERIOR LUMBAR INTERBODY FUSION (PLIF) LUMBAR FOUR-FIVE;  Surgeon: Tia Alert, MD;  Location: [facility];  Service: Neurosurgery;;  FOR MAXIMUM ACCESS (MAS) POSTERIOR LUMBAR INTERBODY FUSION (PLIF) LUMBAR FOUR-FIVE   OOPHORECTOMY     LSO   OPEN REDUCTION INTERNAL FIXATION (ORIF) DISTAL RADIAL FRACTURE Left 09/20/2018   Procedure: OPEN REDUCTION INTERNAL FIXATION (ORIF) DISTAL RADIAL FRACTURE, LEFT;  Surgeon: Kennedy Bucker, MD;  Location: [facility];  Service: Orthopedics;  Laterality: Left;   PLANTAR FASCIA SURGERY  1999   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   VAGINAL HYSTERECTOMY  2001   for bleeding and LSO for cyst   Patient Active Problem List   Diagnosis Date Noted   Reactive hypoglycemia 02/16/2022   Prediabetes 02/16/2022   Chronic venous insufficiency 12/23/2021   Lymphedema 12/13/2021   Panic attacks    Strain of knee 12/24/2019   Vitamin B12 deficiency 10/08/2019   Arthritis 07/16/2019   Edema 07/16/2019   History of abdominal pain 07/16/2019   Low back pain 07/16/2019   Vitamin D deficiency 07/16/2019   Hypothyroidism 02/28/2019   History of gastric bypass 02/28/2019   Obstructive sleep apnea syndrome 02/28/2019   Insomnia 02/28/2019    Class 3 severe obesity due to excess calories with serious comorbidity and body mass index (BMI) of 40.0 to 44.9 in adult (HCC) 02/28/2019   Elevated BP without diagnosis of hypertension 02/28/2019   Mixed hyperlipidemia 02/28/2019   Hot flashes 02/28/2019   BMI 40.0-44.9, adult (HCC) 12/16/2015   Hematoma of lower extremity 11/12/2015   Essential hypertension 06/20/2014   Pure hypercholesterolemia 06/20/2014   S/P lumbar spinal fusion 07/10/2013   Arthrodesis status 07/10/2013   Palpitations 01/02/2012   Edema of both legs 01/02/2012    PCP: Joni Reining, PA-C  REFERRING PROVIDER: Foster Simpson, DO  REFERRING DIAG: I89.0  THERAPY DIAG:  Lymphedema, not elsewhere classified  Rationale for Evaluation and Treatment: Rehabilitation  ONSET DATE: Insidious onset with progression over time. Greater than 20 years  SUBJECTIVE:                                                                                                                                                                                          SUBJECTIVE STATEMENT:Adelina B Ent presents to OT visit to address BLE lipo-lymphedema. Pt is accompanied by her supportive spouse, [name]. Pt denies LE-related pain in her legs. Pt has no new concerns.Pt is wearing initial custom RLE custom compression garment to clinic. She reports she picked it up at the DME vendor's office in [place] this morning. She doesn't know how long they have had it.  PERTINENT HISTORY and contributing factors include, OA, HTN, Panic attacks 2/2 restrictive clothing, OSA (not using CPAP), s/p hysterectomy 1987, L knee sx, oophorectomy, s/p hysterectomy 2001, Gastric Bypass 2007, s/p plantar fascia sx 1999, obesity, insomnia  PAIN:  Are you having LE-related pain? No: NPRS scale: 0/10.  Pain location: BLE- generalized, knees Pain description: discomfort, heavy, tired, full, sore Aggravating factors: standing, walking, extended dependent  sitting Relieving factors: elevation  PRECAUTIONS: Other: LYMPHEDEMA PRECAUTIONS: HYPOTHYROID, DIABETES SKIN PRECAUTIONS  WEIGHT BEARING RESTRICTIONS: No  FALLS:  Has patient fallen since last visit? 04/05/23 YES Pt had a  fall in the middle of the night. She states she got up to go to the bathroom and when rounding the bottom of the bed she passed out and woke up on the floor. Pt was able to get herself up . She is sore today, but uninjured by report. She was wearing the portable heart monitor at the time.   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  BLE COMPARATIVE LIMB VOLUMETRICS:  INTAKE: 11/01/22  LANDMARK RIGHT    R LEG (A-D) 5947.2 ml  R THIGH (E-G) 9750.0 ml  R FULL LIMB (A-G) 15697.2 ml  Limb Volume differential (LVD)  %  Volume change since initial %  Volume change overall V  (Blank rows = not tested)  LANDMARK LEFT   L LEG (A-D) 6011.6 ml  L THIGH (E-G) 9602.5 ml  L FULL LIMB (A-G) 15614.1 ml  Limb Volume differential (LVD)  LEG LVD = 1.8%, L>R % THIGH LVD = 1.5%, R>L FULL LIMB LVD = 0.53%, R>L  Volume change since initial %  Volume change overall %  (Blank rows = not tested)   9th VISIT 11/24/22  LANDMARK LEFT   L LEG (A-D) 5918.6 ml  L THIGH (E-G) 9265.5 ml  L FULL LIMB (A-G) 15184.1  ml  Limb Volume differential (LVD)  LEG volume = Decreased 1.5%;  THIGH volume decreased 3.5 %, AND  L FULL LIMB VOLUME dec BY 2.8 % SINCE 11/01/22  Volume change since initial %  Volume change overall %    20 th VISIT 01/11/23  LANDMARK LEFT   L LEG (A-D) 5181.2 ml  L THIGH (E-G)  ml  L FULL LIMB (A-G)  ml  Limb Volume differential (LVD)  L LEG (A-D) volume  = Decreased 12.4% since 11/24/22. 10% GOAL MET. Did not measure L thigh or full limb today since we have not been bandaging above the knee.   Volume change since initial L LEG (A-D) volume reduction since commencing CDT on 09/02/22 measures 13.8% 10% GOAL MET.   30 th VISIT 01/11/23  LANDMARK RLE  L LEG (A-D) 5691.5  ml  L THIGH (E-G)  ml  L FULL LIMB (A-G)  ml  Limb Volume since initial 11/01/22 DECREASED BY 4.3%  Volume change overall    39 th VISIT 03/28/23  LANDMARK RLE  L LEG (A-D) 5632.0 ml  L THIGH (E-G)  ml  L FULL LIMB (A-G)  ml  Limb Volume since initial 11/01/22 DECREASED BY 1.05 % since last measured on 02/20/23; and DECREASED by 5.4%  since  09/02/22  Volume change overall      50 th VISIT   LANDMARK RLE  R LEG (A-D) 5879.6 ml  R THIGH (E-G)  ml  R FULL LIMB (A-G)  ml  Limb Volume since initial 03/28/23 INC 4.4%  Volume change overall    FOTO functional outcome score:      Lymphedema Life Impact Scale (LLIS):   Date FOTO LLIS  Initial 10/13/22 56%   Final 04/12/23 55%    Initial 10/13/22  36.76%  Final 04/12/23  25% ( 11.76% reduction)  TODAY'S TREATMENT:     RLE/RLQ MLD  RLE multilayer , knee length gradient compression  bandaging Pt/ FAMILY EDU   PATIENT EDUCATION:  Continued Pt/ CG edu for lymphedema self care home program throughout session. Topics include outcome of comparative limb volumetrics- starting vs changing limb volumes over time; technology and gradient techniques used for short stretch, multilayer compression wrapping, simple self-MLD, therapeutic lymphatic pumping exercises, skin/nail care, LE precautions,. compression garment recommendations and specifications, donning and doffing gadgets, wear and care schedule and compression garment donning / doffing w assistive devices. Discussed progress towards all OT goals since commencing CDT. All questions answered to the Pt's satisfaction w comfort and fit. Good return. Person educated: Patient and  Spouse Education method: Explanation, Demonstration, and Handouts Education comprehension: verbalized understanding, returned demonstration, verbal cues required, and needs further education   HOME EXERCISE PROGRAM: Lymphatic Pumping Therex-  1 set of 10, 2 x daily, in order, seated  Daily skin inspection and care with low ph lotion matching skin ph to limit infection risk Simple Self-MLD 1 x daily During Intensive Phase CDT: multilayer compression wraps from foot to groin using gradient techniques, one limb at a time  to ensure safety During Self -Management Phase CDT: Fit with custom daytime compression garments: Elvarex, custom, flat knit, ccl 2 ( 23-32 mmHg) knee highs. Consider pairing with Compreshorts Capri length pantyhose for containment of buttocks, thighs and abdomen.  Fit with BLE knee length Jobst RELAX w/ open toe and zipper to limit ongoing tissue fibrosis formation and facilitate increased lymphatic function during HOS  Custom-made gradient compression garments and HOS devices are medically necessary in this case because they are uniquely sized and shaped to fit the exact dimensions of the affected extremities, and to provide accurate and consistent gradient compression essential for optimally managing this patient's symptoms of chronic, progressive lymphedema. Multiple custom compression garments are needed for optimal hygiene. Custom compression garments should be replaced q 3-6 months When worn consistently for optimal effectiveness.  ASSESSMENT: Initial assessment of R leg custom flat knit compression garment appears  good. Fit is comfortable and appears to fit shape of limb correctly. Pt feels dorsal foot surface feels a little tight, but she will stretch the garment on a form when wet  like L stocking to improve comfort. Pt understands she should wash and wear and report any problems with fit and function to the vendor within a week. Garment appears to fit well today. Function  will be determined by wear.  Pt tolerated MLD and simultaneous skin care to RLE without increased pain or other difficulty. We'll reduce frequency for OT to 1 x weekly as Pt transitions       into LE self management Phase of CDT.We'll see her next week and cont POC.   OBJECTIVE IMPAIRMENTS: Abnormal gait, decreased activity tolerance, decreased balance, decreased knowledge of condition, decreased knowledge of use of DME, decreased mobility, difficulty walking, decreased ROM, increased edema, impaired sensation, obesity, pain, and chronic , progressive swelling and pain of buttocks, hips, abdomen and legs .   ACTIVITY LIMITATIONS: carrying, lifting, bending, sitting, standing, squatting, sleeping, stairs, transfers, bed mobility, bathing, dressing, hygiene/grooming, caring for others, and leisure pursuits, social participation, productive/ work activities  PARTICIPATION LIMITATIONS: meal prep, cleaning, laundry, interpersonal relationship, driving, shopping, community activity, occupation, yard work, and church  PERSONAL FACTORS: Age, Past/current experiences, hx of panic attacks and claustrophobia, Time since onset of injury/illness/exacerbation,, motivation, supportive spouse and family are also affecting patient's functional outcome.  REHAB POTENTIAL: Fair Unfortunately the Lipedema does not respond to CDT and is unchanged by this protocol. Fortunately lymphedema  does respond to differing degrees based on the quality and amount of fatty fibrosis present obstructing lymphatics, and patient's tolerance if skin is hypersensitive.   EVALUATION COMPLEXITY: Moderate   GOALS: Goals reviewed with patient? Yes  SHORT TERM GOALS: Target date: 4th OT Rx visit   Pt will demonstrate understanding of lymphedema precautions and prevention strategies with modified independence using a printed reference to identify at least 5 precautions and discussing how s/he may implement them into daily life to reduce  risk of progression with modified assistance ( printed reference). Baseline: Max A Goal status: 12/12/22 PROGRESSING 01/11/23 GOAL MET  2.  Pt will be able to apply multilayer, thigh length, compression wraps using gradient techniques with Max caregiver assistance to decrease limb volume, to limit infection risk, and to limit lymphedema progression.  Baseline: Dependent Goal status: 11/10/22 Goal Met  LONG TERM GOALS: Target date: 04/24/23  Given this patient's Intake score of 36.76 % on the Lymphedema Life Impact Scale (LLIS), patient will experience a reduction of at least 5 points in her perceived level of functional impairment resulting from lymphedema to improve functional performance and quality of life (QOL). Baseline: 36.76% Goal status: 02/20/23 PROGRESSING  2.  Given this patient's Intake score of 56/100% on the functional outcomes FOTO tool, patient will experience an increase in function of 3 points to improve basic and instrumental ADLs performance, including lymphedema self-care.  Baseline: 56% Goal status: 02/20/23 PROGRESSING  3.  During Intensive phase CDT Pt will achieve at least 85% compliance with all lymphedema self-care home program components, including  daily skin care, multilayer , gradient compression wraps with daily changes, daily simple self MLD and daily lymphatic pumping therex to achieve optimal clinical outcome and to habituate self care regime for optimal LE self-management over time. Baseline: Dependent Goal status: 01/09/23 GOAL MET  4.  Pt will achieve at least a 10% volume reductions bilaterally below the knees to return limb to more typical size and shape, to limit infection risk and LE progression, to decrease pain, to improve function, and to improve body image and QOL. Baseline: Dependent Goal status: 12/12/22 PROGRESSING. 01/11/23 GOAL MET for L LEG  03/28/23 Volumetrics reveal that the R leg is DECREASED in volume BY 1.05 % since last measured on 02/20/23; and  DECREASED by 5.4% since  09/02/22.   5.  Pt will be able to don and doff appropriate compression garments and devices using assistive devices and extra time within 1 week of issue date for optimal lymphedema self-care. Baseline: Dependent Goal status: 01/25/23 GOAL MET   PLAN:  PT FREQUENCY:1 x weekly and PRN  PT DURATION: 12  weeks other: and PRN  PLANNED INTERVENTIONS: Therapeutic exercises, Therapeutic activity, Patient/Family education, Self Care, DME instructions, Manual lymph drainage, Compression bandaging, Manual therapy, and skin care during MLD, fit with appropriate custom compression garments and devices, fit with appropriate compression device  which  follows lymphatic pathways and anatomic distribution  PLAN FOR NEXT SESSION:  Final compression garment assessment Final surveys Final volumetrics Discuss option for Flexitouch exploration   Loel Dubonnet, MS, OTR/L, CLT-LANA 05/18/23 4:05 PM

## 2023-05-22 ENCOUNTER — Ambulatory Visit: Payer: 59 | Admitting: Occupational Therapy

## 2023-05-25 ENCOUNTER — Ambulatory Visit: Payer: 59 | Admitting: Occupational Therapy

## 2023-05-26 ENCOUNTER — Telehealth (HOSPITAL_COMMUNITY): Payer: Self-pay | Admitting: *Deleted

## 2023-05-26 NOTE — Telephone Encounter (Signed)
 Received call from patient regarding upcoming cardiac imaging study; pt verbalizes understanding of appt date/time, parking situation and where to check in, pre-test NPO status and medications ordered, and verified current allergies; name and call back number provided for further questions should they arise Johney Frame RN Navigator Cardiac Imaging [facility] [phone] office [phone] cell

## 2023-05-26 NOTE — Telephone Encounter (Signed)
Attempted to call patient regarding upcoming cardiac CT appointment. Patient unable to review instructions at time, patient states she will call back to discuss.  Johney Frame RN Navigator Cardiac Imaging [facility] [phone] Office

## 2023-05-29 ENCOUNTER — Ambulatory Visit
Admission: RE | Admit: 2023-05-29 | Discharge: 2023-05-29 | Disposition: A | Discharge status: Home / Self Care | Payer: 59 | Source: Ambulatory Visit | Attending: Cardiology | Admitting: Cardiology

## 2023-05-29 DIAGNOSIS — R9439 Abnormal result of other cardiovascular function study: Secondary | ICD-10-CM

## 2023-05-29 DIAGNOSIS — E7800 Pure hypercholesterolemia, unspecified: Secondary | ICD-10-CM | POA: Diagnosis present

## 2023-05-29 DIAGNOSIS — I471 Supraventricular tachycardia, unspecified: Secondary | ICD-10-CM | POA: Diagnosis present

## 2023-05-29 DIAGNOSIS — I4729 Other ventricular tachycardia: Secondary | ICD-10-CM

## 2023-05-29 MED ORDER — IOHEXOL 350 MG/ML SOLN
80.0000 mL | Freq: Once | INTRAVENOUS | Status: AC | PRN
  Administered 2023-05-29: 80 mL via INTRAVENOUS

## 2023-05-29 MED ORDER — NITROGLYCERIN 0.4 MG SL SUBL
0.8000 mg | SUBLINGUAL_TABLET | Freq: Once | SUBLINGUAL | Status: AC
  Administered 2023-05-29: 0.8 mg via SUBLINGUAL
  Filled 2023-05-29: qty 25

## 2023-05-29 MED ORDER — METOPROLOL TARTRATE 5 MG/5ML IV SOLN
5.0000 mg | Freq: Once | INTRAVENOUS | Status: DC
  Filled 2023-05-29: qty 5

## 2023-05-29 NOTE — Progress Notes (Signed)

## 2023-05-30 ENCOUNTER — Ambulatory Visit: Payer: 59 | Admitting: Occupational Therapy

## 2023-05-30 DIAGNOSIS — I890 Lymphedema, not elsewhere classified: Secondary | ICD-10-CM

## 2023-05-30 NOTE — Therapy (Unsigned)
OUTPATIENT OCCUPATIONAL THERAPY TREATMENT NOTE LOWER EXTREMITY LYMPHEDEMA  Patient Name: Mary [name] MRN: [id number] DOB:October 29, 1960, 62 y.o., female Today's Date: 05/31/2023   END OF SESSION:  OT End of Session - 05/30/23 1513     Visit Number 54    Number of Visits 72    Date for OT Re-Evaluation 07/26/23    OT Start Time 0306    OT Stop Time 0406    OT Time Calculation (min) 60 min    Activity Tolerance Patient tolerated treatment well;No increased pain    Behavior During Therapy WFL for tasks assessed/performed                 Past Medical History:  Diagnosis Date   Anemia    low iron at times   Arthritis    Colon polyps    Complication of anesthesia 1991   Pt reports she awoke during plantar fascia surgery and felt "everything"   Heart murmur    " small per patient"    Hyperlipidemia    Hypertension    Hypothyroidism    Lactose intolerance    PAC (premature atrial contraction)    Palpitations    Panic attacks    Pt reports restrictive clothing/areas and dry mouth cause her [name] have attack.   Peripheral vascular disease (HCC)    "small" clot after thermal ablasion   Pneumonia 2016   "walking pneumonia" 3 times in 2016   Shortness of breath    going upstairs (due [name] weight). not so much since weight loss   Sleep apnea    after weight loss does not have [name] use cpap   Vitamin B12 deficiency    Vitamin D deficiency    Past Surgical History:  Procedure Laterality Date   APPENDECTOMY  1985   CESAREAN SECTION  1987   twins   COLONOSCOPY     COLONOSCOPY WITH PROPOFOL N/A 02/27/2018   Procedure: COLONOSCOPY WITH PROPOFOL;  Surgeon: Toledo, Boykin Nearing, MD;  Location: [facility];  Service: Gastroenterology;  Laterality: N/A;   IRRIGATION AND DEBRIDEMENT HEMATOMA Right 11/12/2015   Procedure: IRRIGATION AND DEBRIDEMENT HEMATOMA;  Surgeon: Earline Mayotte, MD;  Location: [facility];  Service: General;  Laterality: Right;   KNEE ARTHROSCOPY WITH MEDIAL  MENISECTOMY Left 07/15/2020   Procedure: Left knee arthroscopy with partial medial or lateral menisectomy, possible chondroplasty, possible partial synovectomy;  Surgeon: Lyndle Herrlich, MD;  Location: [facility];  Service: Orthopedics;  Laterality: Left;   LAMINECTOMY  07/10/2013   L4   L5   DR Yetta Barre   LAPAROSCOPIC SALPINGO OOPHERECTOMY Right 04/17/2014   Procedure: LAPAROSCOPIC SALPINGO OOPHORECTOMY RIGHT;  Surgeon: Adolphus Birchwood, MD;  Location: [facility];  Service: Gynecology;  Laterality: Right;   LYSIS OF ADHESION  04/17/2014   Procedure: LYSIS OF ADHESION;  Surgeon: Adolphus Birchwood, MD;  Location: [facility];  Service: Gynecology;;   MAXIMUM ACCESS (MAS)POSTERIOR LUMBAR INTERBODY FUSION (PLIF) 1 LEVEL  07/10/2013   Procedure: FOR MAXIMUM ACCESS (MAS) POSTERIOR LUMBAR INTERBODY FUSION (PLIF) LUMBAR FOUR-FIVE;  Surgeon: Tia Alert, MD;  Location: [facility];  Service: Neurosurgery;;  FOR MAXIMUM ACCESS (MAS) POSTERIOR LUMBAR INTERBODY FUSION (PLIF) LUMBAR FOUR-FIVE   OOPHORECTOMY     LSO   OPEN REDUCTION INTERNAL FIXATION (ORIF) DISTAL RADIAL FRACTURE Left 09/20/2018   Procedure: OPEN REDUCTION INTERNAL FIXATION (ORIF) DISTAL RADIAL FRACTURE, LEFT;  Surgeon: Kennedy Bucker, MD;  Location: [facility];  Service: Orthopedics;  Laterality: Left;   PLANTAR FASCIA SURGERY  1999   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   VAGINAL HYSTERECTOMY  2001   for bleeding and LSO for cyst   Patient Active Problem List   Diagnosis Date Noted   Reactive hypoglycemia 02/16/2022   Prediabetes 02/16/2022   Chronic venous insufficiency 12/23/2021   Lymphedema 12/13/2021   Panic attacks    Strain of knee 12/24/2019   Vitamin B12 deficiency 10/08/2019   Arthritis 07/16/2019   Edema 07/16/2019   History of abdominal pain 07/16/2019   Low back pain 07/16/2019   Vitamin D deficiency 07/16/2019   Hypothyroidism 02/28/2019   History of gastric bypass 02/28/2019   Obstructive sleep apnea syndrome 02/28/2019   Insomnia 02/28/2019    Class 3 severe obesity due [name] excess calories with serious comorbidity and body mass index (BMI) of 40.0 [name] 44.9 in adult (HCC) 02/28/2019   Elevated BP without diagnosis of hypertension 02/28/2019   Mixed hyperlipidemia 02/28/2019   Hot flashes 02/28/2019   BMI 40.0-44.9, adult (HCC) 12/16/2015   Hematoma of lower extremity 11/12/2015   Essential hypertension 06/20/2014   Pure hypercholesterolemia 06/20/2014   S/P lumbar spinal fusion 07/10/2013   Arthrodesis status 07/10/2013   Palpitations 01/02/2012   Edema of both legs 01/02/2012    PCP: Joni Reining, PA-C  REFERRING PROVIDER: Foster Simpson, DO  REFERRING DIAG: I89.0  THERAPY DIAG:  Lymphedema, not elsewhere classified  Rationale for Evaluation and Treatment: Rehabilitation  ONSET DATE: Insidious onset with progression over time. Greater than 20 years  SUBJECTIVE:                                                                                                                                                                                          SUBJECTIVE STATEMENT:Mary [name] presents [name] OT visit [name] address BLE lipo-lymphedema. Pt is accompanied by her supportive spouse, Mary [name]. Pt denies LE-related pain in her legs. Pt has no new concerns with initial RLE custom compression garment. She wore it on her trip [name] [place] and it worked very well for her. She feels she has some mild swelling after sitting in the care fore  two 10 hour days traveling.  Pt and OT and spouse discussed transition [name] self-management phase of CDT going forward during session.   PERTINENT HISTORY and contributing factors include, OA, HTN, Panic attacks 2/2 restrictive clothing, OSA (not using CPAP), s/p hysterectomy 1987, L knee sx, oophorectomy, s/p hysterectomy 2001, Gastric Bypass 2007, s/p plantar fascia sx 1999, obesity, insomnia  PAIN:  Are you having LE-related pain? No: NPRS scale: 0/10.  Pain location: BLE- generalized, knees Pain  description: discomfort, heavy, tired, full, sore Aggravating factors:  standing, walking, extended dependent sitting Relieving factors: elevation  PRECAUTIONS: Other: LYMPHEDEMA PRECAUTIONS: HYPOTHYROID, DIABETES SKIN PRECAUTIONS  WEIGHT BEARING RESTRICTIONS: No  FALLS:  Has patient fallen since last visit? 04/05/23 YES Pt had a  fall in the middle of the night. She states she got up [name] go [name] the bathroom and when rounding the bottom of the bed she passed out and woke up on the floor. Pt was able [name] get herself up . She is sore today, but uninjured by report. She was wearing the portable heart monitor at the time.   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  BLE COMPARATIVE LIMB VOLUMETRICS:  INTAKE: 11/01/22  LANDMARK RIGHT    R LEG (A-D) 5947.2 ml  R THIGH (E-G) 9750.0 ml  R FULL LIMB (A-G) 15697.2 ml  Limb Volume differential (LVD)  %  Volume change since initial %  Volume change overall V  (Blank rows = not tested)  LANDMARK LEFT   L LEG (A-D) 6011.6 ml  L THIGH (E-G) 9602.5 ml  L FULL LIMB (A-G) 15614.1 ml  Limb Volume differential (LVD)  LEG LVD = 1.8%, L>R % THIGH LVD = 1.5%, R>L FULL LIMB LVD = 0.53%, R>L  Volume change since initial %  Volume change overall %  (Blank rows = not tested)   9th VISIT 11/24/22  LANDMARK LEFT   L LEG (A-D) 5918.6 ml  L THIGH (E-G) 9265.5 ml  L FULL LIMB (A-G) 15184.1  ml  Limb Volume differential (LVD)  LEG volume = Decreased 1.5%;  THIGH volume decreased 3.5 %, AND  L FULL LIMB VOLUME dec BY 2.8 % SINCE 11/01/22  Volume change since initial %  Volume change overall %    20 th VISIT 01/11/23  LANDMARK LEFT   L LEG  (A-D) 5181.2 ml  L THIGH (E-G)  ml  L FULL LIMB (A-G)  ml  Limb Volume differential (LVD)  L LEG (A-D) volume = Decreased 12.4% since 11/24/22. 10% GOAL MET. Did not measure L thigh or full limb today since we have not been bandaging above the knee.   Volume change since initial L LEG (A-D) volume reduction since commencing CDT on 09/02/22 measures 13.8% 10% GOAL MET.   30 th VISIT 01/11/23  LANDMARK RLE  L LEG (A-D) 5691.5  ml  L THIGH (E-G)  ml  L FULL LIMB (A-G)  ml  Limb Volume since initial 11/01/22 DECREASED BY 4.3%  Volume change overall    39 th VISIT 03/28/23  LANDMARK RLE  L LEG (A-D) 5632.0 ml  L THIGH (E-G)  ml  L FULL LIMB (A-G)  ml  Limb Volume since initial 11/01/22 DECREASED BY 1.05 % since last measured on 02/20/23; and DECREASED by 5.4%  since  09/02/22  Volume change overall      50 th VISIT   LANDMARK RLE  R LEG (A-D) 5879.6 ml  R THIGH (E-G)  ml  R FULL LIMB (A-G)  ml  Limb Volume since initial 03/28/23 INC 4.4%  Volume change overall    FOTO functional outcome score:      Lymphedema Life Impact Scale (LLIS):   Date FOTO LLIS  Initial  10/13/22 56%   Final 04/12/23 55%    Initial 10/13/22  36.76%  Final 04/12/23  25% ( 11.76% reduction)    TODAY'S TREATMENT:     RLE/RLQ MLD  RLE multilayer , knee length gradient compression  bandaging Pt/ FAMILY EDU   PATIENT EDUCATION:  Continued Pt/ CG edu for lymphedema self care home program throughout session. Topics include outcome of comparative limb volumetrics- starting vs changing limb volumes over time; technology and gradient techniques used for short stretch, multilayer compression wrapping, simple self-MLD, therapeutic lymphatic pumping exercises, skin/nail care, LE precautions,. compression garment recommendations and specifications, donning and doffing gadgets, wear and care schedule and compression garment donning / doffing w assistive devices. Discussed progress towards all OT goals since commencing CDT. All  questions answered [name] the Pt's satisfaction w comfort and fit. Good return. Person educated: Patient and Spouse Education method: Explanation, Demonstration, and Handouts Education comprehension: verbalized understanding, returned demonstration, verbal cues required, and needs further education   HOME EXERCISE PROGRAM: Lymphatic Pumping Therex-  1 set of 10, 2 x daily, in order, seated  Daily skin inspection and care with low ph lotion matching skin ph [name] limit infection risk Simple Self-MLD 1 x daily During Intensive Phase CDT: multilayer compression wraps from foot [name] groin using gradient techniques, one limb at a time  [name] ensure safety During Self -Management Phase CDT: Fit with custom daytime compression garments: Elvarex, custom, flat knit, ccl 2 ( 23-32 mmHg) knee highs. Consider pairing with Compreshorts Capri length pantyhose for containment of buttocks, thighs and abdomen.  Fit with BLE knee length Jobst RELAX w/ open toe and zipper [name] limit ongoing tissue fibrosis formation and facilitate increased lymphatic function during HOS  Custom-made gradient compression garments and HOS devices are medically necessary in this case because they are uniquely sized and shaped [name] fit the exact dimensions of the affected extremities, and [name] provide accurate and consistent gradient compression essential for optimally managing this patient's symptoms of chronic, progressive lymphedema. Multiple custom compression garments are needed for optimal hygiene. Custom compression garments should be replaced q 3-6 months When worn consistently for optimal effectiveness.  ASSESSMENT: Pt and spouse educated  re transition [name] self-management phase of CDT after completing Intensive Phase of Rx and custom garment fitting is complete. Pt is reluctant [name] reduce frequency [name] primarily "follow along" until remaining LLE garment is fitted and she has a chance [name] investigate HOS devices for limiting HOS swelling and fibrosis  formation. Pt and spouse in agreement with continuing OT 1 x weekly for ~ 4 weeks [name] complete these activities. DME vendor is already working on an estimate for the daytime and HOS garments.   Pt tolerated MLD without increased pain. RLE is mildly swollen compared with condition observed at last session. Pt denies LE related pain. Pt encouraged  use self bandaging if she would like [name] expedite swelling reduction due [name] recent car travel.     OBJECTIVE IMPAIRMENTS: Abnormal gait, decreased activity tolerance, decreased balance, decreased knowledge of condition, decreased knowledge of use of DME, decreased mobility, difficulty walking, decreased ROM, increased edema, impaired sensation, obesity, pain, and chronic , progressive swelling and pain of buttocks, hips, abdomen and legs .   ACTIVITY LIMITATIONS: carrying, lifting, bending, sitting, standing, squatting, sleeping, stairs, transfers, bed mobility, bathing, dressing, hygiene/grooming, caring for others, and leisure pursuits, social participation, productive/ work activities  PARTICIPATION LIMITATIONS: meal prep, cleaning, laundry, interpersonal relationship, driving, shopping, community activity, occupation, yard work, and church  PERSONAL FACTORS:  Age, Past/current experiences, hx of panic attacks and claustrophobia, Time since onset of injury/illness/exacerbation,, motivation, supportive spouse and family are also affecting patient's functional outcome.   REHAB POTENTIAL: Fair Unfortunately the Lipedema does not respond [name] CDT and is unchanged by this protocol. Fortunately lymphedema  does respond [name] differing degrees based on the quality and amount of fatty fibrosis present obstructing lymphatics, and patient's tolerance if skin is hypersensitive.   EVALUATION COMPLEXITY: Moderate   GOALS: Goals reviewed with patient? Yes  SHORT TERM GOALS: Target date: 4th OT Rx visit   Pt will demonstrate understanding of lymphedema precautions and  prevention strategies with modified independence using a printed reference [name] identify at least 5 precautions and discussing how s/he may implement them into daily life [name] reduce risk of progression with modified assistance ( printed reference). Baseline: Max A Goal status: 12/12/22 PROGRESSING 01/11/23 GOAL MET  2.  Pt will be able [name] apply multilayer, thigh length, compression wraps using gradient techniques with Max caregiver assistance [name] decrease limb volume, [name] limit infection risk, and [name] limit lymphedema progression.  Baseline: Dependent Goal status: 11/10/22 Goal Met  LONG TERM GOALS: Target date: 04/24/23  Given this patient's Intake score of 36.76 % on the Lymphedema Life Impact Scale (LLIS), patient will experience a reduction of at least 5 points in her perceived level of functional impairment resulting from lymphedema [name] improve functional performance and quality of life (QOL). Baseline: 36.76% Goal status: 02/20/23 PROGRESSING  2.  Given this patient's Intake score of 56/100% on the functional outcomes FOTO tool, patient will experience an increase in function of 3 points [name] improve basic and instrumental ADLs performance, including lymphedema self-care.  Baseline: 56% Goal status: 02/20/23 PROGRESSING  3.  During Intensive phase CDT Pt will achieve at least 85% compliance with all lymphedema self-care home program components, including  daily skin care, multilayer , gradient compression wraps with daily changes, daily simple self MLD and daily lymphatic pumping therex [name] achieve optimal clinical outcome and [name] habituate self care regime for optimal LE self-management over time. Baseline: Dependent Goal status: 01/09/23 GOAL MET  4.  Pt will achieve at least a 10% volume reductions bilaterally below the knees [name] return limb [name] more typical size and shape, [name] limit infection risk and LE progression, [name] decrease pain, [name] improve function, and [name] improve body image and QOL. Baseline:  Dependent Goal status: 12/12/22 PROGRESSING. 01/11/23 GOAL MET for L LEG  03/28/23 Volumetrics reveal that the R leg is DECREASED in volume BY 1.05 % since last measured on 02/20/23; and DECREASED by 5.4% since  09/02/22.   5.  Pt will be able [name] don and doff appropriate compression garments and devices using assistive devices and extra time within 1 week of issue date for optimal lymphedema self-care. Baseline: Dependent Goal status: 01/25/23 GOAL MET   PLAN:  PT FREQUENCY:1 x weekly and PRN  PT DURATION: 12  weeks other: and PRN  PLANNED INTERVENTIONS: Therapeutic exercises, Therapeutic activity, Patient/Family education, Self Care, DME instructions, Manual lymph drainage, Compression bandaging, Manual therapy, and skin care during MLD, fit with appropriate custom compression garments and devices, fit with appropriate compression device  which  follows lymphatic pathways and anatomic distribution  PLAN FOR NEXT SESSION:  Final compression garment assessment Final surveys Final volumetrics Discuss option for Flexitouch exploration   Loel Dubonnet, MS, OTR/L, CLT-LANA 05/31/23 12:51 PM

## 2023-05-31 ENCOUNTER — Encounter: Payer: Self-pay | Admitting: Occupational Therapy

## 2023-06-01 ENCOUNTER — Ambulatory Visit: Payer: 59 | Admitting: Occupational Therapy

## 2023-06-05 ENCOUNTER — Ambulatory Visit: Payer: 59 | Admitting: Occupational Therapy

## 2023-06-05 DIAGNOSIS — I890 Lymphedema, not elsewhere classified: Secondary | ICD-10-CM | POA: Diagnosis not present

## 2023-06-05 NOTE — Therapy (Signed)
OUTPATIENT OCCUPATIONAL THERAPY TREATMENT NOTE LOWER EXTREMITY LYMPHEDEMA  Patient Name: Mary [name] MRN: [id number] DOB:Jan 16, 1961, 62 y.o., female Today's Date: 06/05/2023   END OF SESSION:  OT End of Session - 06/05/23 1515     Visit Number 55    Number of Visits 72    Date for OT Re-Evaluation 07/26/23    OT Start Time 0313    OT Stop Time 0403    OT Time Calculation (min) 50 min    Activity Tolerance Patient tolerated treatment well;No increased pain    Behavior During Therapy WFL for tasks assessed/performed                 Past Medical History:  Diagnosis Date   Anemia    low iron at times   Arthritis    Colon polyps    Complication of anesthesia 1991   Pt reports she awoke during plantar fascia surgery and felt "everything"   Heart murmur    " small per patient"    Hyperlipidemia    Hypertension    Hypothyroidism    Lactose intolerance    PAC (premature atrial contraction)    Palpitations    Panic attacks    Pt reports restrictive clothing/areas and dry mouth cause her to have attack.   Peripheral vascular disease (HCC)    "small" clot after thermal ablasion   Pneumonia 2016   "walking pneumonia" 3 times in 2016   Shortness of breath    going upstairs (due to weight). not so much since weight loss   Sleep apnea    after weight loss does not have to use cpap   Vitamin B12 deficiency    Vitamin D deficiency    Past Surgical History:  Procedure Laterality Date   APPENDECTOMY  1985   CESAREAN SECTION  1987   twins   COLONOSCOPY     COLONOSCOPY WITH PROPOFOL N/A 02/27/2018   Procedure: COLONOSCOPY WITH PROPOFOL;  Surgeon: Toledo, Boykin Nearing, MD;  Location: [facility];  Service: Gastroenterology;  Laterality: N/A;   IRRIGATION AND DEBRIDEMENT HEMATOMA Right 11/12/2015   Procedure: IRRIGATION AND DEBRIDEMENT HEMATOMA;  Surgeon: Earline Mayotte, MD;  Location: [facility];  Service: General;  Laterality: Right;   KNEE ARTHROSCOPY WITH MEDIAL  MENISECTOMY Left 07/15/2020   Procedure: Left knee arthroscopy with partial medial or lateral menisectomy, possible chondroplasty, possible partial synovectomy;  Surgeon: Lyndle Herrlich, MD;  Location: [facility];  Service: Orthopedics;  Laterality: Left;   LAMINECTOMY  07/10/2013   L4   L5   DR Yetta Barre   LAPAROSCOPIC SALPINGO OOPHERECTOMY Right 04/17/2014   Procedure: LAPAROSCOPIC SALPINGO OOPHORECTOMY RIGHT;  Surgeon: Adolphus Birchwood, MD;  Location: [facility];  Service: Gynecology;  Laterality: Right;   LYSIS OF ADHESION  04/17/2014   Procedure: LYSIS OF ADHESION;  Surgeon: Adolphus Birchwood, MD;  Location: [facility];  Service: Gynecology;;   MAXIMUM ACCESS (MAS)POSTERIOR LUMBAR INTERBODY FUSION (PLIF) 1 LEVEL  07/10/2013   Procedure: FOR MAXIMUM ACCESS (MAS) POSTERIOR LUMBAR INTERBODY FUSION (PLIF) LUMBAR FOUR-FIVE;  Surgeon: Tia Alert, MD;  Location: [facility];  Service: Neurosurgery;;  FOR MAXIMUM ACCESS (MAS) POSTERIOR LUMBAR INTERBODY FUSION (PLIF) LUMBAR FOUR-FIVE   OOPHORECTOMY     LSO   OPEN REDUCTION INTERNAL FIXATION (ORIF) DISTAL RADIAL FRACTURE Left 09/20/2018   Procedure: OPEN REDUCTION INTERNAL FIXATION (ORIF) DISTAL RADIAL FRACTURE, LEFT;  Surgeon: Kennedy Bucker, MD;  Location: [facility];  Service: Orthopedics;  Laterality: Left;   PLANTAR FASCIA SURGERY  1999   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   VAGINAL HYSTERECTOMY  2001   for bleeding and LSO for cyst   Patient Active Problem List   Diagnosis Date Noted   Reactive hypoglycemia 02/16/2022   Prediabetes 02/16/2022   Chronic venous insufficiency 12/23/2021   Lymphedema 12/13/2021   Panic attacks    Strain of knee 12/24/2019   Vitamin B12 deficiency 10/08/2019   Arthritis 07/16/2019   Edema 07/16/2019   History of abdominal pain 07/16/2019   Low back pain 07/16/2019   Vitamin D deficiency 07/16/2019   Hypothyroidism 02/28/2019   History of gastric bypass 02/28/2019   Obstructive sleep apnea syndrome 02/28/2019   Insomnia 02/28/2019    Class 3 severe obesity due to excess calories with serious comorbidity and body mass index (BMI) of 40.0 to 44.9 in adult (HCC) 02/28/2019   Elevated BP without diagnosis of hypertension 02/28/2019   Mixed hyperlipidemia 02/28/2019   Hot flashes 02/28/2019   BMI 40.0-44.9, adult (HCC) 12/16/2015   Hematoma of lower extremity 11/12/2015   Essential hypertension 06/20/2014   Pure hypercholesterolemia 06/20/2014   S/P lumbar spinal fusion 07/10/2013   Arthrodesis status 07/10/2013   Palpitations 01/02/2012   Edema of both legs 01/02/2012    PCP: Joni Reining, PA-C  REFERRING PROVIDER: Foster Simpson, DO  REFERRING DIAG: I89.0  THERAPY DIAG:  Lymphedema, not elsewhere classified  Rationale for Evaluation and Treatment: Rehabilitation  ONSET DATE: Insidious onset with progression over time. Greater than 20 years  SUBJECTIVE:                                                                                                                                                                                          SUBJECTIVE STATEMENT:Mary [name] presents to OT visit to address BLE lipo-lymphedema. Pt is accompanied by her supportive spouse, [name]. Pt denies LE-related pain in her legs. Pt expresses some concern that newest RLE compression stocking is not containing swelling.  PERTINENT HISTORY and contributing factors include, OA, HTN, Panic attacks 2/2 restrictive clothing, OSA (not using CPAP), s/p hysterectomy 1987, L knee sx, oophorectomy, s/p hysterectomy 2001, Gastric Bypass 2007, s/p plantar fascia sx 1999, obesity, insomnia  PAIN:  Are you having LE-related pain? No: NPRS scale: 0/10.  Pain location: BLE- generalized, knees Pain description: discomfort, heavy, tired, full, sore Aggravating factors: standing, walking, extended dependent sitting Relieving factors: elevation  PRECAUTIONS: Other: LYMPHEDEMA PRECAUTIONS: HYPOTHYROID, DIABETES SKIN PRECAUTIONS  WEIGHT  BEARING RESTRICTIONS: No  FALLS:  Has patient fallen since last visit? 04/05/23 YES Pt had a  fall in the middle of the night. She states she got up to  go to the bathroom and when rounding the bottom of the bed she passed out and woke up on the floor. Pt was able to get herself up . She is sore today, but uninjured by report. She was wearing the portable heart monitor at the time.   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  BLE COMPARATIVE LIMB VOLUMETRICS:  INTAKE: 11/01/22  LANDMARK RIGHT    R LEG (A-D) 5947.2 ml  R THIGH (E-G) 9750.0 ml  R FULL LIMB (A-G) 15697.2 ml  Limb Volume differential (LVD)  %  Volume change since initial %  Volume change overall V  (Blank rows = not tested)  LANDMARK LEFT   L LEG (A-D) 6011.6 ml  L THIGH (E-G) 9602.5 ml  L FULL LIMB (A-G) 15614.1 ml  Limb Volume differential (LVD)  LEG LVD = 1.8%, L>R % THIGH LVD = 1.5%, R>L FULL LIMB LVD = 0.53%, R>L  Volume change since initial %  Volume change overall %  (Blank rows = not tested)   9th VISIT 11/24/22  LANDMARK LEFT   L LEG (A-D) 5918.6 ml  L THIGH (E-G) 9265.5 ml  L FULL LIMB (A-G) 15184.1  ml  Limb Volume differential (LVD)  LEG volume = Decreased 1.5%;  THIGH volume decreased 3.5 %, AND  L FULL LIMB VOLUME dec BY 2.8 % SINCE 11/01/22  Volume change since initial %  Volume change overall %    20 th VISIT 01/11/23  LANDMARK LEFT   L LEG (A-D) 5181.2 ml  L THIGH (E-G)  ml  L FULL LIMB (A-G)  ml  Limb Volume differential (LVD)  L LEG (A-D) volume = Decreased 12.4% since 11/24/22. 10% GOAL MET. Did not measure L thigh or full limb today since we have not been bandaging above the  knee.   Volume change since initial L LEG (A-D) volume reduction since commencing CDT on 09/02/22 measures 13.8% 10% GOAL MET.   30 th VISIT 01/11/23  LANDMARK RLE  L LEG (A-D) 5691.5  ml  L THIGH (E-G)  ml  L FULL LIMB (A-G)  ml  Limb Volume since initial 11/01/22 DECREASED BY 4.3%  Volume change overall    39 th VISIT 03/28/23  LANDMARK RLE  L LEG (A-D) 5632.0 ml  L THIGH (E-G)  ml  L FULL LIMB (A-G)  ml  Limb Volume since initial 11/01/22 DECREASED BY 1.05 % since last measured on 02/20/23; and DECREASED by 5.4%  since  09/02/22  Volume change overall      50 th VISIT   LANDMARK RLE  R LEG (A-D) 5879.6 ml  R THIGH (E-G)  ml  R FULL LIMB (A-G)  ml  Limb Volume since initial 03/28/23 INC 4.4%  Volume change overall    FOTO functional outcome score:      Lymphedema Life Impact Scale (LLIS):   Date FOTO LLIS  Initial 10/13/22 56%   Final 04/12/23 55%    Initial 10/13/22  36.76%  Final 04/12/23  25% ( 11.76% reduction)    TODAY'S TREATMENT:     RLE/RLQ MLD  RLE multilayer , knee length gradient compression  bandaging Pt/ FAMILY EDU   PATIENT EDUCATION:  Continued Pt/ CG edu for lymphedema self care home program throughout session. Topics include outcome of comparative limb volumetrics- starting vs changing limb volumes over time; technology and gradient techniques used for short stretch, multilayer compression wrapping, simple self-MLD, therapeutic lymphatic pumping exercises, skin/nail care, LE precautions,. compression garment recommendations and specifications, donning and doffing gadgets, wear and care schedule and compression garment donning / doffing w assistive devices. Discussed progress towards all OT goals since commencing CDT. All questions answered to the Pt's satisfaction w comfort and fit. Good return. Person educated: Patient and Spouse Education method: Explanation, Demonstration, and Handouts Education comprehension: verbalized understanding, returned  demonstration, verbal cues required, and needs further education   HOME EXERCISE PROGRAM: Lymphatic Pumping Therex-  1 set of 10, 2 x daily, in order, seated  Daily skin inspection and care with low ph lotion matching skin ph to limit infection risk Simple Self-MLD 1 x daily During Intensive Phase CDT: multilayer compression wraps from foot to groin using gradient techniques, one limb at a time  to ensure safety During Self -Management Phase CDT: Fit with custom daytime compression garments: Elvarex, custom, flat knit, ccl 2 ( 23-32 mmHg) knee highs. Consider pairing with Compreshorts Capri length pantyhose for containment of buttocks, thighs and abdomen.  Fit with BLE knee length Jobst RELAX w/ open toe and zipper to limit ongoing tissue fibrosis formation and facilitate increased lymphatic function during HOS  Custom-made gradient compression garments and HOS devices are medically necessary in this case because they are uniquely sized and shaped to fit the exact dimensions of the affected extremities, and to provide accurate and consistent gradient compression essential for optimally managing this patient's symptoms of chronic, progressive lymphedema. Multiple custom compression garments are needed for optimal hygiene. Custom compression garments should be replaced q 3-6 months When worn consistently for optimal effectiveness.  ASSESSMENT: Completed repeat custom garment measurements for RLE knee high compression garment   . Anatomical measurements are unchanged since completing measurements initially indicating that garment is actually containing leg. Discussed compression and containment with Pt and spouse, and the option of increasing to ccl 3 in flat knit. Pt will consider this and we'll revisit next session.   Pt tolerated MLD without increased pain. RLE is mildly swollen compared with condition observed at last session. Pt denies LE related pain. Pt encouraged  use self bandaging if she would  like to expedite swelling reduction due to recent car travel.  Cont as per POC.   OBJECTIVE IMPAIRMENTS: Abnormal gait, decreased activity tolerance, decreased balance, decreased knowledge of condition, decreased knowledge of use of DME, decreased mobility, difficulty walking, decreased ROM, increased edema, impaired sensation, obesity, pain, and chronic , progressive swelling and pain of buttocks, hips, abdomen and legs .   ACTIVITY LIMITATIONS: carrying, lifting, bending, sitting, standing, squatting, sleeping, stairs, transfers, bed mobility, bathing, dressing, hygiene/grooming, caring for others, and leisure pursuits, social participation, productive/ work activities  PARTICIPATION LIMITATIONS: meal prep, cleaning, laundry, interpersonal relationship, driving, shopping, community activity, occupation, yard work, and church  PERSONAL FACTORS: Age, Past/current experiences, hx of panic attacks and claustrophobia, Time since onset of injury/illness/exacerbation,, motivation, supportive spouse and family are also affecting patient's functional outcome.   REHAB POTENTIAL: Fair Unfortunately the Lipedema does not respond to CDT and is unchanged by this protocol. Fortunately lymphedema  does respond to differing degrees based on the quality and amount of fatty fibrosis present obstructing lymphatics, and patient's tolerance if skin is hypersensitive.   EVALUATION COMPLEXITY: Moderate   GOALS: Goals  reviewed with patient? Yes  SHORT TERM GOALS: Target date: 4th OT Rx visit   Pt will demonstrate understanding of lymphedema precautions and prevention strategies with modified independence using a printed reference to identify at least 5 precautions and discussing how s/he may implement them into daily life to reduce risk of progression with modified assistance ( printed reference). Baseline: Max A Goal status: 12/12/22 PROGRESSING 01/11/23 GOAL MET  2.  Pt will be able to apply multilayer, thigh  length, compression wraps using gradient techniques with Max caregiver assistance to decrease limb volume, to limit infection risk, and to limit lymphedema progression.  Baseline: Dependent Goal status: 11/10/22 Goal Met  LONG TERM GOALS: Target date: 04/24/23  Given this patient's Intake score of 36.76 % on the Lymphedema Life Impact Scale (LLIS), patient will experience a reduction of at least 5 points in her perceived level of functional impairment resulting from lymphedema to improve functional performance and quality of life (QOL). Baseline: 36.76% Goal status: 02/20/23 PROGRESSING  2.  Given this patient's Intake score of 56/100% on the functional outcomes FOTO tool, patient will experience an increase in function of 3 points to improve basic and instrumental ADLs performance, including lymphedema self-care.  Baseline: 56% Goal status: 02/20/23 PROGRESSING  3.  During Intensive phase CDT Pt will achieve at least 85% compliance with all lymphedema self-care home program components, including  daily skin care, multilayer , gradient compression wraps with daily changes, daily simple self MLD and daily lymphatic pumping therex to achieve optimal clinical outcome and to habituate self care regime for optimal LE self-management over time. Baseline: Dependent Goal status: 01/09/23 GOAL MET  4.  Pt will achieve at least a 10% volume reductions bilaterally below the knees to return limb to more typical size and shape, to limit infection risk and LE progression, to decrease pain, to improve function, and to improve body image and QOL. Baseline: Dependent Goal status: 12/12/22 PROGRESSING. 01/11/23 GOAL MET for L LEG  03/28/23 Volumetrics reveal that the R leg is DECREASED in volume BY 1.05 % since last measured on 02/20/23; and DECREASED by 5.4% since  09/02/22.   5.  Pt will be able to don and doff appropriate compression garments and devices using assistive devices and extra time within 1 week of issue date  for optimal lymphedema self-care. Baseline: Dependent Goal status: 01/25/23 GOAL MET   PLAN:  PT FREQUENCY:1 x weekly and PRN  PT DURATION: 12  weeks other: and PRN  PLANNED INTERVENTIONS: Therapeutic exercises, Therapeutic activity, Patient/Family education, Self Care, DME instructions, Manual lymph drainage, Compression bandaging, Manual therapy, and skin care during MLD, fit with appropriate custom compression garments and devices, fit with appropriate compression device  which  follows lymphatic pathways and anatomic distribution  PLAN FOR NEXT SESSION:  Final compression garment assessment Final surveys Final volumetrics Discuss option for Flexitouch exploration   Loel Dubonnet, MS, OTR/L, CLT-LANA 06/05/23 4:07 PM

## 2023-06-06 ENCOUNTER — Other Ambulatory Visit: Payer: Self-pay

## 2023-06-06 DIAGNOSIS — F411 Generalized anxiety disorder: Secondary | ICD-10-CM

## 2023-06-07 MED ORDER — ALPRAZOLAM 0.5 MG PO TABS: 0.5000 mg | ORAL_TABLET | Freq: Two times a day (BID) | ORAL | 0 refills | Status: DC | PRN

## 2023-06-13 ENCOUNTER — Ambulatory Visit: Payer: 59 | Admitting: Occupational Therapy

## 2023-06-14 ENCOUNTER — Encounter: Payer: 59 | Admitting: Occupational Therapy

## 2023-06-15 ENCOUNTER — Other Ambulatory Visit: Payer: Self-pay | Admitting: Cardiovascular Disease

## 2023-06-15 ENCOUNTER — Ambulatory Visit: Payer: 59 | Admitting: Occupational Therapy

## 2023-06-15 DIAGNOSIS — R000 Tachycardia, unspecified: Secondary | ICD-10-CM

## 2023-06-20 ENCOUNTER — Ambulatory Visit: Payer: 59 | Attending: Plastic Surgery | Admitting: Occupational Therapy

## 2023-06-20 DIAGNOSIS — I890 Lymphedema, not elsewhere classified: Secondary | ICD-10-CM | POA: Diagnosis present

## 2023-06-21 NOTE — Therapy (Signed)
OUTPATIENT OCCUPATIONAL THERAPY TREATMENT NOTE LOWER EXTREMITY LYMPHEDEMA  Patient Name: Mary [name] MRN: [id number] DOB:[birth year], 62 y.o., female Today's Date: 06/21/2023   END OF SESSION:  OT End of Session - 06/20/23 1148     Visit Number 56    Number of Visits 72    Date for OT Re-Evaluation 07/26/23    OT Start Time 0315    OT Stop Time 0406    OT Time Calculation (min) 51 min    Activity Tolerance Patient tolerated treatment well;No increased pain    Behavior During Therapy WFL for tasks assessed/performed                 Past Medical History:  Diagnosis Date   Anemia    low iron at times   Arthritis    Colon polyps    Complication of anesthesia 1991   Pt reports she awoke during plantar fascia surgery and felt "everything"   Heart murmur    " small per patient"    Hyperlipidemia    Hypertension    Hypothyroidism    Lactose intolerance    PAC (premature atrial contraction)    Palpitations    Panic attacks    Pt reports restrictive clothing/areas and dry mouth cause her to have attack.   Peripheral vascular disease (HCC)    "small" clot after thermal ablasion   Pneumonia 2016   "walking pneumonia" 3 times in 2016   Shortness of breath    going upstairs (due to weight). not so much since weight loss   Sleep apnea    after weight loss does not have to use cpap   Vitamin B12 deficiency    Vitamin D deficiency    Past Surgical History:  Procedure Laterality Date   APPENDECTOMY  1985   CESAREAN SECTION  1987   twins   COLONOSCOPY     COLONOSCOPY WITH PROPOFOL N/A 02/27/2018   Procedure: COLONOSCOPY WITH PROPOFOL;  Surgeon: Toledo, Boykin Nearing, MD;  Location: [facility];  Service: Gastroenterology;  Laterality: N/A;   IRRIGATION AND DEBRIDEMENT HEMATOMA Right 11/12/2015   Procedure: IRRIGATION AND DEBRIDEMENT HEMATOMA;  Surgeon: Earline Mayotte, MD;  Location: [facility];  Service: General;  Laterality: Right;   KNEE ARTHROSCOPY WITH MEDIAL  MENISECTOMY Left 07/15/2020   Procedure: Left knee arthroscopy with partial medial or lateral menisectomy, possible chondroplasty, possible partial synovectomy;  Surgeon: Lyndle Herrlich, MD;  Location: [facility];  Service: Orthopedics;  Laterality: Left;   LAMINECTOMY  07/10/2013   L4   L5   DR Yetta Barre   LAPAROSCOPIC SALPINGO OOPHERECTOMY Right 04/17/2014   Procedure: LAPAROSCOPIC SALPINGO OOPHORECTOMY RIGHT;  Surgeon: Adolphus Birchwood, MD;  Location: [facility];  Service: Gynecology;  Laterality: Right;   LYSIS OF ADHESION  04/17/2014   Procedure: LYSIS OF ADHESION;  Surgeon: Adolphus Birchwood, MD;  Location: [facility];  Service: Gynecology;;   MAXIMUM ACCESS (MAS)POSTERIOR LUMBAR INTERBODY FUSION (PLIF) 1 LEVEL  07/10/2013   Procedure: FOR MAXIMUM ACCESS (MAS) POSTERIOR LUMBAR INTERBODY FUSION (PLIF) LUMBAR FOUR-FIVE;  Surgeon: Tia Alert, MD;  Location: [facility];  Service: Neurosurgery;;  FOR MAXIMUM ACCESS (MAS) POSTERIOR LUMBAR INTERBODY FUSION (PLIF) LUMBAR FOUR-FIVE   OOPHORECTOMY     LSO   OPEN REDUCTION INTERNAL FIXATION (ORIF) DISTAL RADIAL FRACTURE Left 09/20/2018   Procedure: OPEN REDUCTION INTERNAL FIXATION (ORIF) DISTAL RADIAL FRACTURE, LEFT;  Surgeon: Kennedy Bucker, MD;  Location: [facility];  Service: Orthopedics;  Laterality: Left;   PLANTAR FASCIA SURGERY  1999   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   VAGINAL HYSTERECTOMY  2001   for bleeding and LSO for cyst   Patient Active Problem List   Diagnosis Date Noted   Reactive hypoglycemia 02/16/2022   Prediabetes 02/16/2022   Chronic venous insufficiency 12/23/2021   Lymphedema 12/13/2021   Panic attacks    Strain of knee 12/24/2019   Vitamin B12 deficiency 10/08/2019   Arthritis 07/16/2019   Edema 07/16/2019   History of abdominal pain 07/16/2019   Low back pain 07/16/2019   Vitamin D deficiency 07/16/2019   Hypothyroidism 02/28/2019   History of gastric bypass 02/28/2019   Obstructive sleep apnea syndrome 02/28/2019   Insomnia 02/28/2019    Class 3 severe obesity due to excess calories with serious comorbidity and body mass index (BMI) of 40.0 to 44.9 in adult (HCC) 02/28/2019   Elevated BP without diagnosis of hypertension 02/28/2019   Mixed hyperlipidemia 02/28/2019   Hot flashes 02/28/2019   BMI 40.0-44.9, adult (HCC) 12/16/2015   Hematoma of lower extremity 11/12/2015   Essential hypertension 06/20/2014   Pure hypercholesterolemia 06/20/2014   S/P lumbar spinal fusion 07/10/2013   Arthrodesis status 07/10/2013   Palpitations 01/02/2012   Edema of both legs 01/02/2012    PCP: Joni Reining, PA-C  REFERRING PROVIDER: Foster Simpson, DO  REFERRING DIAG: I89.0  THERAPY DIAG:  No diagnosis found.  Rationale for Evaluation and Treatment: Rehabilitation  ONSET DATE: Insidious onset with progression over time. Greater than 20 years  SUBJECTIVE:                                                                                                                                                                                          SUBJECTIVE STATEMENT:Mary [name] presents to OT visit to address BLE lipo-lymphedema. Pt is accompanied by her supportive spouse, Mary [name]. Pt denies LE-related pain in her legs. Pt expresses frustration that she still has not received an estimate for custom compression garments.   PERTINENT HISTORY and contributing factors include, OA, HTN, Panic attacks 2/2 restrictive clothing, OSA (not using CPAP), s/p hysterectomy 1987, L knee sx, oophorectomy, s/p hysterectomy 2001, Gastric Bypass 2007, s/p plantar fascia sx 1999, obesity, insomnia  PAIN:  Are you having LE-related pain? No: NPRS scale: 0/10.  Pain location: BLE- generalized, knees Pain description: discomfort, heavy, tired, full, sore Aggravating factors: standing, walking, extended dependent sitting Relieving factors: elevation  PRECAUTIONS: Other: LYMPHEDEMA PRECAUTIONS: HYPOTHYROID, DIABETES SKIN PRECAUTIONS  WEIGHT BEARING  RESTRICTIONS: No  FALLS:  Has patient fallen since last visit? 04/05/23 YES Pt had a  fall in the middle of the night. She states she got  up to go to the bathroom and when rounding the bottom of the bed she passed out and woke up on the floor. Pt was able to get herself up . She is sore today, but uninjured by report. She was wearing the portable heart monitor at the time.   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  BLE COMPARATIVE LIMB VOLUMETRICS:  INTAKE: 11/01/22  LANDMARK RIGHT    R LEG (A-D) 5947.2 ml  R THIGH (E-G) 9750.0 ml  R FULL LIMB (A-G) 15697.2 ml  Limb Volume differential (LVD)  %  Volume change since initial %  Volume change overall V  (Blank rows = not tested)  LANDMARK LEFT   L LEG (A-D) 6011.6 ml  L THIGH (E-G) 9602.5 ml  L FULL LIMB (A-G) 15614.1 ml  Limb Volume differential (LVD)  LEG LVD = 1.8%, L>R % THIGH LVD = 1.5%, R>L FULL LIMB LVD = 0.53%, R>L  Volume change since initial %  Volume change overall %  (Blank rows = not tested)   9th VISIT 11/24/22  LANDMARK LEFT   L LEG (A-D) 5918.6 ml  L THIGH (E-G) 9265.5 ml  L FULL LIMB (A-G) 15184.1  ml  Limb Volume differential (LVD)  LEG volume = Decreased 1.5%;  THIGH volume decreased 3.5 %, AND  L FULL LIMB VOLUME dec BY 2.8 % SINCE 11/01/22  Volume change since initial %  Volume change overall %    20 th VISIT 01/11/23  LANDMARK LEFT   L LEG (A-D) 5181.2 ml  L THIGH (E-G)  ml  L FULL LIMB (A-G)  ml  Limb Volume differential (LVD)  L LEG (A-D) volume = Decreased 12.4% since 11/24/22. 10% GOAL MET. Did not measure L thigh or full limb today since we have not been bandaging above the knee.    Volume change since initial L LEG (A-D) volume reduction since commencing CDT on 09/02/22 measures 13.8% 10% GOAL MET.   30 th VISIT 01/11/23  LANDMARK RLE  L LEG (A-D) 5691.5  ml  L THIGH (E-G)  ml  L FULL LIMB (A-G)  ml  Limb Volume since initial 11/01/22 DECREASED BY 4.3%  Volume change overall    39 th VISIT 03/28/23  LANDMARK RLE  L LEG (A-D) 5632.0 ml  L THIGH (E-G)  ml  L FULL LIMB (A-G)  ml  Limb Volume since initial 11/01/22 DECREASED BY 1.05 % since last measured on 02/20/23; and DECREASED by 5.4%  since  09/02/22  Volume change overall      50 th VISIT   LANDMARK RLE  R LEG (A-D) 5879.6 ml  R THIGH (E-G)  ml  R FULL LIMB (A-G)  ml  Limb Volume since initial 03/28/23 INC 4.4%  Volume change overall    FOTO functional outcome score:      Lymphedema Life Impact Scale (LLIS):   Date FOTO LLIS  Initial 10/13/22 56%   Final 04/12/23 55%    Initial 10/13/22  36.76%  Final 04/12/23  25% ( 11.76% reduction)   Final Comparative limb volumetrics 06/21/23  LANDMARK RIGHT    R LEG (A-D) 6112.0 ml  R THIGH (E-G) ml  R FULL  LIMB (A-G)  ml  Limb Volume differential (LVD)  %  Volume change since last INC 3.9%  Volume change overall V  (Blank rows = not tested)  LANDMARK LEFT   L LEG (A-D) 6303.7 ml  L THIGH (E-G)   L FULL LIMB (A-G)   Limb Volume differential (LVD)  %  Volume change since last  INC 21.7%  Volume change overall %  (Blank rows = not tested)  TODAY'S TREATMENT:     Final BLE comparative limb volumetrics Pt/ FAMILY EDU   PATIENT EDUCATION:  Continued Pt/ CG edu for lymphedema self care home program throughout session. Topics include outcome of comparative limb volumetrics- starting vs changing limb volumes over time; technology and gradient techniques used for short stretch, multilayer compression wrapping, simple self-MLD, therapeutic lymphatic pumping exercises, skin/nail care, LE precautions,. compression garment recommendations and specifications,  donning and doffing gadgets, wear and care schedule and compression garment donning / doffing w assistive devices. Discussed progress towards all OT goals since commencing CDT. All questions answered to the Pt's satisfaction w comfort and fit. Good return. Person educated: Patient and Spouse Education method: Explanation, Demonstration, and Handouts Education comprehension: verbalized understanding, returned demonstration, verbal cues required, and needs further education   HOME EXERCISE PROGRAM: Lymphatic Pumping Therex-  1 set of 10, 2 x daily, in order, seated  Daily skin inspection and care with low ph lotion matching skin ph to limit infection risk Simple Self-MLD 1 x daily During Intensive Phase CDT: multilayer compression wraps from foot to groin using gradient techniques, one limb at a time  to ensure safety During Self -Management Phase CDT: Fit with custom daytime compression garments: Elvarex, custom, flat knit, ccl 2 ( 23-32 mmHg) knee highs. Consider pairing with Compreshorts Capri length pantyhose for containment of buttocks, thighs and abdomen.  Fit with BLE knee length Jobst RELAX w/ open toe and zipper to limit ongoing tissue fibrosis formation and facilitate increased lymphatic function during HOS  Custom-made gradient compression garments and HOS devices are medically necessary in this case because they are uniquely sized and shaped to fit the exact dimensions of the affected extremities, and to provide accurate and consistent gradient compression essential for optimally managing this patient's symptoms of chronic, progressive lymphedema. Multiple custom compression garments are needed for optimal hygiene. Custom compression garments should be replaced q 3-6 months When worn consistently for optimal effectiveness.  ASSESSMENT: Final comparative limb volumetrics reveal increase in R leg volume  of 3.9% since last measured on 1029/24. Overall volumetric change for R Leg reveals an  increase  of 2.77%. Goal not met for the RLE. LLE volumetrics reveal a dramatic 21.7% increase in volume since last measured 8/24, and an overall change of 0.016% reduction since commencing CDT. Goal not met.Pt's existing garments continue to fit well and she is compliant with full time daily  wear as recommended. Pt continues with skin care there ex and MLD as prescribed. Please review GOALS section for final assessment. Pt will continue to work with garment vendors on obtaining recommended compression garments, and will notify OT PRN. We'll DC OT if we do not have communication within a few weeks.    OBJECTIVE IMPAIRMENTS: Abnormal gait, decreased activity tolerance, decreased balance, decreased knowledge of condition, decreased knowledge of use of DME, decreased mobility, difficulty walking, decreased ROM, increased edema, impaired sensation, obesity, pain, and chronic , progressive swelling and pain of buttocks, hips, abdomen and legs .   ACTIVITY LIMITATIONS: carrying, lifting, bending, sitting, standing,  squatting, sleeping, stairs, transfers, bed mobility, bathing, dressing, hygiene/grooming, caring for others, and leisure pursuits, social participation, productive/ work activities  PARTICIPATION LIMITATIONS: meal prep, cleaning, laundry, interpersonal relationship, driving, shopping, community activity, occupation, yard work, and church  PERSONAL FACTORS: Age, Past/current experiences, hx of panic attacks and claustrophobia, Time since onset of injury/illness/exacerbation,, motivation, supportive spouse and family are also affecting patient's functional outcome.   REHAB POTENTIAL: Fair Unfortunately the Lipedema does not respond to CDT and is unchanged by this protocol. Fortunately lymphedema  does respond to differing degrees based on the quality and amount of fatty fibrosis present obstructing lymphatics, and patient's tolerance if skin is hypersensitive.   EVALUATION COMPLEXITY:  Moderate   GOALS: Goals reviewed with patient? Yes  SHORT TERM GOALS: Target date: 4th OT Rx visit   Pt will demonstrate understanding of lymphedema precautions and prevention strategies with modified independence using a printed reference to identify at least 5 precautions and discussing how s/he may implement them into daily life to reduce risk of progression with modified assistance ( printed reference). Baseline: Max A Goal status: 12/12/22 PROGRESSING 01/11/23 GOAL MET  2.  Pt will be able to apply multilayer, thigh length, compression wraps using gradient techniques with Max caregiver assistance to decrease limb volume, to limit infection risk, and to limit lymphedema progression.  Baseline: Dependent Goal status: 11/10/22 Goal Met  LONG TERM GOALS: Target date: 04/24/23  Given this patient's Intake score of 36.76 % on the Lymphedema Life Impact Scale (LLIS), patient will experience a reduction of at least 5 points in her perceived level of functional impairment resulting from lymphedema to improve functional performance and quality of life (QOL). Baseline: Final 25%. DECREASED 11.76 points.  Goal status: GOAL MET  2.  Given this patient's Intake score of 56/100% on the functional outcomes FOTO tool, patient will experience an increase in function of 3 points to improve basic and instrumental ADLs performance, including lymphedema self-care.  Baseline: Final 55% Goal status:GOAL NOT MET  3.  During Intensive phase CDT Pt will achieve at least 85% compliance with all lymphedema self-care home program components, including  daily skin care, multilayer , gradient compression wraps with daily changes, daily simple self MLD and daily lymphatic pumping therex to achieve optimal clinical outcome and to habituate self care regime for optimal LE self-management over time. Baseline: Dependent Goal status: GOAL MET  4.  Pt will achieve at least a 10% volume reductions bilaterally below the knees  to return limb to more typical size and shape, to limit infection risk and LE progression, to decrease pain, to improve function, and to improve body image and QOL. Baseline: Dependent Goal status: Final comparative limb volumetrics reveal increase in R leg volume  of 3.9% since last measured on 1029/24. Overall volumetric change for R Leg reveals an increase  of 2.77%. Goal not met for the RLE. LLE volumetrics reveal a dramatic 21.7% increase in volume since last measured 8/24, and an overall change of 0.016% reduction since commencing CDT. Goal not met.  5.  Pt will be able to don and doff appropriate compression garments and devices using assistive devices and extra time within 1 week of issue date for optimal lymphedema self-care. Baseline: Dependent Goal status: 01/25/23 GOAL MET   PLAN:  PT FREQUENCY:Follow up PRN  PT DURATION: 12  weeks other: and PRN  PLANNED INTERVENTIONS: Therapeutic exercises, Therapeutic activity, Patient/Family education, Self Care, DME instructions, Manual lymph drainage, Compression bandaging, Manual therapy, and skin care during MLD,  fit with appropriate custom compression garments and devices, fit with appropriate compression device  which  follows lymphatic pathways and anatomic distribution  PLAN FOR NEXT SESSION:  Final compression garment assessment Final surveys Final volumetrics Discuss option for Flexitouch exploration   Loel Dubonnet, MS, OTR/L, CLT-LANA 06/21/23 1:20 PM

## 2023-06-22 ENCOUNTER — Ambulatory Visit: Payer: 59 | Admitting: Occupational Therapy

## 2023-06-27 ENCOUNTER — Ambulatory Visit: Payer: 59 | Admitting: Occupational Therapy

## 2023-06-29 ENCOUNTER — Other Ambulatory Visit: Payer: Self-pay | Admitting: Physician Assistant

## 2023-06-29 ENCOUNTER — Encounter: Payer: Self-pay | Admitting: Physician Assistant

## 2023-06-29 ENCOUNTER — Ambulatory Visit: Payer: 59 | Admitting: Occupational Therapy

## 2023-06-29 DIAGNOSIS — E559 Vitamin D deficiency, unspecified: Secondary | ICD-10-CM

## 2023-06-29 DIAGNOSIS — E782 Mixed hyperlipidemia: Secondary | ICD-10-CM

## 2023-06-30 ENCOUNTER — Other Ambulatory Visit: Payer: Self-pay | Admitting: Physician Assistant

## 2023-06-30 ENCOUNTER — Other Ambulatory Visit: Payer: Self-pay

## 2023-06-30 MED ORDER — PSEUDOEPH-BROMPHEN-DM 30-2-10 MG/5ML PO SYRP: 5.0000 mL | ORAL_SOLUTION | Freq: Four times a day (QID) | ORAL | 0 refills | Status: DC | PRN

## 2023-06-30 MED ORDER — NIRMATRELVIR/RITONAVIR (PAXLOVID)TABLET: 3.0000 | ORAL_TABLET | Freq: Two times a day (BID) | ORAL | 0 refills | Status: AC

## 2023-06-30 NOTE — Progress Notes (Signed)
Spoke with Corrie Mckusick & she updated her med list.  AMD

## 2023-07-18 ENCOUNTER — Telehealth: Payer: Self-pay

## 2023-07-18 ENCOUNTER — Other Ambulatory Visit: Payer: Self-pay | Admitting: Physician Assistant

## 2023-07-18 ENCOUNTER — Encounter: Payer: 59 | Admitting: Occupational Therapy

## 2023-07-18 DIAGNOSIS — E079 Disorder of thyroid, unspecified: Secondary | ICD-10-CM

## 2023-07-18 NOTE — Telephone Encounter (Signed)
 Received notification from [employer] that they started a prior authorization for Mary [name]'s Zolpidem  Tartrate ER 12.5 mg tablets through covermymeds.  Key:  [id number] Patient Last Name:  [name] DOB:  [birth year]  Instructed to complete the PA through covermymeds using the above Key Code.  Completed PA process & awaiting their notification

## 2023-07-27 ENCOUNTER — Encounter: Payer: 59 | Admitting: Occupational Therapy

## 2023-08-01 ENCOUNTER — Encounter: Payer: 59 | Admitting: Occupational Therapy

## 2023-08-03 ENCOUNTER — Encounter: Payer: 59 | Admitting: Occupational Therapy

## 2023-08-07 ENCOUNTER — Ambulatory Visit: Payer: Self-pay | Admitting: Student

## 2023-08-07 VITALS — BP 186/80 | HR 76 | Temp 97.8°F | Resp 16

## 2023-08-07 DIAGNOSIS — J069 Acute upper respiratory infection, unspecified: Secondary | ICD-10-CM

## 2023-08-07 MED ORDER — AMOXICILLIN-POT CLAVULANATE 875-125 MG PO TABS: 1.0000 | ORAL_TABLET | Freq: Two times a day (BID) | ORAL | 0 refills | Status: AC

## 2023-08-07 MED ORDER — PSEUDOEPH-BROMPHEN-DM 30-2-10 MG/5ML PO SYRP: 5.0000 mL | ORAL_SOLUTION | Freq: Four times a day (QID) | ORAL | 0 refills | Status: DC | PRN

## 2023-08-07 NOTE — Progress Notes (Signed)
   Acute Office Visit  Subjective:     Patient ID: Mary [name], female    DOB: 1961/02/12, 63 y.o.   MRN: [id number]  Cough x 14 days   HPI Patient is in today for evaluation of cough and upper respiratory issues.  Patient states that she was experiencing some sinus issues approximately 2 weeks ago.  Sinus issues have resolved.  1 episode of nosebleed that also self resolved.  Complains of persistent, productive cough with green and tasting sputum.  She has been taking Sudafed and Mucinex.  Denies fever or chill.  ROS      Objective:    BP (!) 186/80   Pulse 76   Temp 97.8 F (36.6 C)   Resp 16   SpO2 96%    Physical Exam Constitutional:      Appearance: Normal appearance.  HENT:     Head: Atraumatic.     Right Ear: Tympanic membrane normal.     Left Ear: Tympanic membrane normal.     Nose: Nose normal.     Mouth/Throat:     Mouth: Mucous membranes are moist.     Pharynx: Oropharyngeal exudate present.  Eyes:     Conjunctiva/sclera: Conjunctivae normal.  Cardiovascular:     Rate and Rhythm: Normal rate and regular rhythm.     Heart sounds: Normal heart sounds.  Pulmonary:     Effort: Pulmonary effort is normal.     Breath sounds: Wheezing present.  Musculoskeletal:     Cervical back: Neck supple. No tenderness.  Skin:    General: Skin is warm and dry.  Neurological:     Mental Status: She is alert.  Psychiatric:        Behavior: Behavior normal.     No results found for any visits on 08/07/23.      Assessment & Plan:  Acute upper respiratory infection Plan: Augmentin, brompheniramine pseudoephedrine cough syrup Discussed physical exam findings with patient.  Patient presents with worsening, acute upper respiratory issues.  Patient states symptoms initially started approximately 2 weeks ago with sinus complaints.  It has been progressively worsening and moved down to her chest with persistent coughing that is keeping her up at night.  States it is  productive with green-colored sputum with bowel taste.  Denies any fever or chills.  Given timeline and symptoms, symptoms consistent with acute upper respiratory infection.  Recommended which she states has helped her in the past.  She was also prescribed cough medication during previous similar complaint which helped as well.  Refill provided on.  Recommend follow-up in 7 days for reevaluation additional treatment options.  Patient expressed understanding and agreement of plan.  Problem List Items Addressed This Visit   None   No orders of the defined types were placed in this encounter.   No follow-ups on file.  Ivar Drape

## 2023-08-07 NOTE — Progress Notes (Signed)
Reportedly had positive covid in December and took Paxlovid and recovered.  Is contacting cardiology due to continued elevated BP she stated.

## 2023-08-07 NOTE — Progress Notes (Signed)
Mary [name] called C/O productive cough since Friday - coughing up "green bad tasting" phlegm Also, reports a nosebleed on Friday Coughed until it made ribs hurt & headache Said she had a "sinus infection" last week but didn't seek medical Tx - treated w/ OTC sudafed Last week her teeth were hurting Wheezing - notices more when laying down Denies fever, sinus pain/pressure, ear discomfort & nasal drainage  Has been taking OTC Nyquil & Tylenol

## 2023-08-08 ENCOUNTER — Encounter: Payer: 59 | Admitting: Occupational Therapy

## 2023-08-10 ENCOUNTER — Encounter: Payer: 59 | Admitting: Occupational Therapy

## 2023-08-14 ENCOUNTER — Other Ambulatory Visit: Payer: Self-pay

## 2023-08-14 DIAGNOSIS — E538 Deficiency of other specified B group vitamins: Secondary | ICD-10-CM

## 2023-08-14 DIAGNOSIS — E559 Vitamin D deficiency, unspecified: Secondary | ICD-10-CM

## 2023-08-14 MED ORDER — VITAMIN D (ERGOCALCIFEROL) 1.25 MG (50000 UNIT) PO CAPS: 50000.0000 [IU] | ORAL_CAPSULE | ORAL | 9 refills | Status: AC

## 2023-08-14 MED ORDER — CYANOCOBALAMIN 1000 MCG/ML IJ SOLN: 1000.0000 ug | INTRAMUSCULAR | 2 refills | Status: AC

## 2023-08-15 ENCOUNTER — Encounter: Payer: 59 | Admitting: Occupational Therapy

## 2023-08-17 ENCOUNTER — Encounter: Payer: 59 | Admitting: Occupational Therapy

## 2023-08-22 ENCOUNTER — Encounter: Payer: 59 | Admitting: Occupational Therapy

## 2023-08-24 ENCOUNTER — Encounter: Payer: 59 | Admitting: Occupational Therapy

## 2023-08-29 ENCOUNTER — Encounter: Payer: 59 | Admitting: Occupational Therapy

## 2023-08-31 ENCOUNTER — Encounter: Payer: 59 | Admitting: Occupational Therapy

## 2023-09-05 ENCOUNTER — Encounter: Payer: 59 | Admitting: Occupational Therapy

## 2023-09-07 ENCOUNTER — Encounter: Payer: 59 | Admitting: Occupational Therapy

## 2023-09-12 ENCOUNTER — Encounter: Payer: 59 | Admitting: Occupational Therapy

## 2023-09-14 ENCOUNTER — Other Ambulatory Visit: Payer: Self-pay | Admitting: Physician Assistant

## 2023-09-14 ENCOUNTER — Other Ambulatory Visit: Payer: Self-pay

## 2023-09-14 ENCOUNTER — Encounter: Payer: 59 | Admitting: Occupational Therapy

## 2023-09-14 DIAGNOSIS — G4700 Insomnia, unspecified: Secondary | ICD-10-CM

## 2023-10-12 ENCOUNTER — Other Ambulatory Visit: Payer: Self-pay

## 2023-10-12 DIAGNOSIS — G4700 Insomnia, unspecified: Secondary | ICD-10-CM

## 2023-10-12 MED ORDER — ZOLPIDEM TARTRATE ER 12.5 MG PO TBCR: 12.5000 mg | EXTENDED_RELEASE_TABLET | Freq: Every evening | ORAL | 5 refills | Status: DC | PRN

## 2023-11-09 ENCOUNTER — Other Ambulatory Visit: Payer: Self-pay | Admitting: Physician Assistant

## 2023-11-09 ENCOUNTER — Other Ambulatory Visit: Payer: Self-pay

## 2023-11-09 DIAGNOSIS — F411 Generalized anxiety disorder: Secondary | ICD-10-CM

## 2023-11-09 MED ORDER — ALPRAZOLAM 0.5 MG PO TABS
0.5000 mg | ORAL_TABLET | Freq: Two times a day (BID) | ORAL | 0 refills | Status: DC | PRN
Start: 2023-11-09 — End: 2024-01-17

## 2023-11-28 ENCOUNTER — Encounter (INDEPENDENT_AMBULATORY_CARE_PROVIDER_SITE_OTHER): Payer: Self-pay

## 2023-11-28 DIAGNOSIS — F419 Anxiety disorder, unspecified: Secondary | ICD-10-CM | POA: Insufficient documentation

## 2023-11-28 DIAGNOSIS — I471 Supraventricular tachycardia, unspecified: Secondary | ICD-10-CM | POA: Insufficient documentation

## 2023-12-01 ENCOUNTER — Ambulatory Visit: Payer: Self-pay

## 2023-12-01 DIAGNOSIS — E538 Deficiency of other specified B group vitamins: Secondary | ICD-10-CM

## 2023-12-01 DIAGNOSIS — Z0000 Encounter for general adult medical examination without abnormal findings: Secondary | ICD-10-CM

## 2023-12-01 DIAGNOSIS — R7309 Other abnormal glucose: Secondary | ICD-10-CM

## 2023-12-01 DIAGNOSIS — Z01810 Encounter for preprocedural cardiovascular examination: Secondary | ICD-10-CM

## 2023-12-01 DIAGNOSIS — E559 Vitamin D deficiency, unspecified: Secondary | ICD-10-CM

## 2023-12-01 LAB — POCT URINALYSIS DIPSTICK
Bilirubin, UA: NEGATIVE
Blood, UA: NEGATIVE
Glucose, UA: NEGATIVE
Ketones, UA: NEGATIVE
Leukocytes, UA: NEGATIVE
Nitrite, UA: NEGATIVE
Protein, UA: POSITIVE — AB
Spec Grav, UA: 1.025 (ref 1.010–1.025)
Urobilinogen, UA: 0.2 U/dL
pH, UA: 5.5 (ref 5.0–8.0)

## 2023-12-01 NOTE — Progress Notes (Signed)
 Pt presents today to complete physical. Pt requested some extra labs due to cath procedure Tuesday. Mary [name]

## 2023-12-02 LAB — VITAMIN D 25 HYDROXY (VIT D DEFICIENCY, FRACTURES): Vit D, 25-Hydroxy: 31.1 ng/mL (ref 30.0–100.0)

## 2023-12-02 LAB — CMP12+LP+TP+TSH+6AC+CBC/D/PLT
ALT: 17 IU/L (ref 0–32)
AST: 17 IU/L (ref 0–40)
Albumin: 4.3 g/dL (ref 3.9–4.9)
Alkaline Phosphatase: 122 IU/L — ABNORMAL HIGH (ref 44–121)
BUN/Creatinine Ratio: 21 (ref 12–28)
BUN: 13 mg/dL (ref 8–27)
Basophils Absolute: 0 10*3/uL (ref 0.0–0.2)
Basos: 1 %
Bilirubin Total: 0.3 mg/dL (ref 0.0–1.2)
Calcium: 9 mg/dL (ref 8.7–10.3)
Chloride: 105 mmol/L (ref 96–106)
Chol/HDL Ratio: 2.5 ratio (ref 0.0–4.4)
Cholesterol, Total: 198 mg/dL (ref 100–199)
Creatinine, Ser: 0.62 mg/dL (ref 0.57–1.00)
EOS (ABSOLUTE): 0.1 10*3/uL (ref 0.0–0.4)
Eos: 1 %
Estimated CHD Risk: <0.5 times avg. (ref 0.0–1.0)
Free Thyroxine Index: 3.4 (ref 1.2–4.9)
GGT: 16 IU/L (ref 0–60)
Globulin, Total: 2.1 g/dL (ref 1.5–4.5)
Glucose: 112 mg/dL — ABNORMAL HIGH (ref 70–99)
HDL: 79 mg/dL (ref >39)
Hematocrit: 41.7 % (ref 34.0–46.6)
Hemoglobin: 14 g/dL (ref 11.1–15.9)
Immature Grans (Abs): 0 10*3/uL (ref 0.0–0.1)
Immature Granulocytes: 0 %
Iron: 87 ug/dL (ref 27–139)
LDH: 191 IU/L (ref 119–226)
LDL Chol Calc (NIH): 103 mg/dL — ABNORMAL HIGH (ref 0–99)
Lymphocytes Absolute: 1.4 10*3/uL (ref 0.7–3.1)
Lymphs: 26 %
MCH: 29 pg (ref 26.6–33.0)
MCHC: 33.6 g/dL (ref 31.5–35.7)
MCV: 86 fL (ref 79–97)
Monocytes Absolute: 0.5 10*3/uL (ref 0.1–0.9)
Monocytes: 8 %
Neutrophils Absolute: 3.5 10*3/uL (ref 1.4–7.0)
Neutrophils: 64 %
Phosphorus: 2.7 mg/dL — ABNORMAL LOW (ref 3.0–4.3)
Platelets: 179 10*3/uL (ref 150–450)
Potassium: 4.3 mmol/L (ref 3.5–5.2)
RBC: 4.83 x10E6/uL (ref 3.77–5.28)
RDW: 14.5 % (ref 11.7–15.4)
Sodium: 142 mmol/L (ref 134–144)
T3 Uptake Ratio: 28 % (ref 24–39)
T4, Total: 12.1 ug/dL — ABNORMAL HIGH (ref 4.5–12.0)
TSH: 3.05 u[IU]/mL (ref 0.450–4.500)
Total Protein: 6.4 g/dL (ref 6.0–8.5)
Triglycerides: 88 mg/dL (ref 0–149)
Uric Acid: 4.5 mg/dL (ref 3.0–7.2)
VLDL Cholesterol Cal: 16 mg/dL (ref 5–40)
WBC: 5.5 10*3/uL (ref 3.4–10.8)
eGFR: 101 mL/min/{1.73_m2} (ref >59)

## 2023-12-02 LAB — PROTIME-INR
INR: 1 (ref 0.9–1.2)
Prothrombin Time: 11.1 s (ref 9.1–12.0)

## 2023-12-02 LAB — HGB A1C W/O EAG: Hgb A1c MFr Bld: 6.2 % — ABNORMAL HIGH (ref 4.8–5.6)

## 2023-12-02 LAB — MAGNESIUM: Magnesium: 2.1 mg/dL (ref 1.6–2.3)

## 2023-12-02 LAB — VITAMIN B12: Vitamin B-12: 699 pg/mL (ref 232–1245)

## 2023-12-07 ENCOUNTER — Encounter: Admitting: Physician Assistant

## 2023-12-11 ENCOUNTER — Ambulatory Visit: Payer: Self-pay | Admitting: Physician Assistant

## 2023-12-11 ENCOUNTER — Encounter: Payer: Self-pay | Admitting: Physician Assistant

## 2023-12-11 VITALS — BP 152/86 | HR 57 | Resp 16 | Wt 297.0 lb

## 2023-12-11 DIAGNOSIS — Z0000 Encounter for general adult medical examination without abnormal findings: Secondary | ICD-10-CM

## 2023-12-11 NOTE — Progress Notes (Signed)
 [facility] ____________________________________________   None    (approximate)  I have reviewed the triage vital signs and the nursing notes.   HISTORY  Chief Complaint No chief complaint on file.   HPI Mary [name] is a 63 y.o. female patient presents for annual physical exam.  Patient  seen by cardiology secondary to SVTs.  Multiple attempts of ablation was tried and terminated secondary to spontaneous return to normal sinus rhythm prior to assessing the irritant area to perform procedure.  This occurred on 12/05/2023.  Patient was discharged and advised to take Multaq 400 mg twice a day.  Patient states 3 days ago she started experiencing bradycardia as measured by her home unit.  Patient decreased Multaq to once a day in the morning.  Patient has continue beta-blocker and states she noticed heart rate averaged between 55 to 57 bpm.  Patient has not notified her cardiologist of these changes.  Patient also wishes to start for Phentermine  for weight loss.  Advised patient to check with cardiologist since this medicine is a stimulant.  Patient also to discuss taking Ozempic for weight loss she failed the banding procedure few years ago.         Past Medical History:  Diagnosis Date   Anemia    low iron at times   Arthritis    Colon polyps    Complication of anesthesia 1991   Pt reports she awoke during plantar fascia surgery and felt "everything"   Heart murmur    " small per patient"    Hyperlipidemia    Hypertension    Hypothyroidism    Lactose intolerance    PAC (premature atrial contraction)    Palpitations    Panic attacks    Pt reports restrictive clothing/areas and dry mouth cause her to have attack.   Peripheral vascular disease (HCC)    "small" clot after thermal ablasion   Pneumonia 2016   "walking pneumonia" 3 times in 2016   Shortness of breath    going upstairs (due to weight). not so much since weight loss   Sleep  apnea    after weight loss does not have to use cpap   Vitamin B12 deficiency    Vitamin D  deficiency     Patient Active Problem List   Diagnosis Date Noted   Anxiety 11/28/2023   SVT (supraventricular tachycardia) (HCC) 11/28/2023   Reactive hypoglycemia 02/16/2022   Prediabetes 02/16/2022   Chronic venous insufficiency 12/23/2021   Lymphedema 12/13/2021   Panic attacks    Strain of knee 12/24/2019   Vitamin B12 deficiency 10/08/2019   Arthritis 07/16/2019   Edema 07/16/2019   Low back pain 07/16/2019   Vitamin D  deficiency 07/16/2019   Hypothyroidism 02/28/2019   History of gastric bypass 02/28/2019   Obstructive sleep apnea syndrome 02/28/2019   Insomnia 02/28/2019   Class 3 severe obesity due to excess calories with serious comorbidity and body mass index (BMI) of 40.0 to 44.9 in adult 02/28/2019   Elevated BP without diagnosis of hypertension 02/28/2019   Mixed hyperlipidemia 02/28/2019   Hot flashes 02/28/2019   BMI 40.0-44.9, adult (HCC) 12/16/2015   Hematoma of lower extremity 11/12/2015   Essential hypertension 06/20/2014   Pure hypercholesterolemia 06/20/2014   S/P lumbar spinal fusion 07/10/2013   Arthrodesis status 07/10/2013   Palpitations 01/02/2012   Edema of both legs 01/02/2012    Past Surgical History:  Procedure Laterality Date   APPENDECTOMY  1985   CESAREAN  SECTION  1987   twins   COLONOSCOPY     COLONOSCOPY WITH PROPOFOL  N/A 02/27/2018   Procedure: COLONOSCOPY WITH PROPOFOL ;  Surgeon: Toledo, Alphonsus Jeans, MD;  Location: [facility];  Service: Gastroenterology;  Laterality: N/A;   IRRIGATION AND DEBRIDEMENT HEMATOMA Right 11/12/2015   Procedure: IRRIGATION AND DEBRIDEMENT HEMATOMA;  Surgeon: Marshall Skeeter, MD;  Location: [facility];  Service: General;  Laterality: Right;   KNEE ARTHROSCOPY WITH MEDIAL MENISECTOMY Left 07/15/2020   Procedure: Left knee arthroscopy with partial medial or lateral menisectomy, possible chondroplasty, possible partial  synovectomy;  Surgeon: Jerlyn Moons, MD;  Location: [facility];  Service: Orthopedics;  Laterality: Left;   LAMINECTOMY  07/10/2013   L4   L5   DR Rochelle Chu   LAPAROSCOPIC SALPINGO OOPHERECTOMY Right 04/17/2014   Procedure: LAPAROSCOPIC SALPINGO OOPHORECTOMY RIGHT;  Surgeon: Alphonso Aschoff, MD;  Location: [facility];  Service: Gynecology;  Laterality: Right;   LYSIS OF ADHESION  04/17/2014   Procedure: LYSIS OF ADHESION;  Surgeon: Alphonso Aschoff, MD;  Location: [facility];  Service: Gynecology;;   MAXIMUM ACCESS (MAS)POSTERIOR LUMBAR INTERBODY FUSION (PLIF) 1 LEVEL  07/10/2013   Procedure: FOR MAXIMUM ACCESS (MAS) POSTERIOR LUMBAR INTERBODY FUSION (PLIF) LUMBAR FOUR-FIVE;  Surgeon: Isadora Mar, MD;  Location: [facility];  Service: Neurosurgery;;  FOR MAXIMUM ACCESS (MAS) POSTERIOR LUMBAR INTERBODY FUSION (PLIF) LUMBAR FOUR-FIVE   OOPHORECTOMY     LSO   OPEN REDUCTION INTERNAL FIXATION (ORIF) DISTAL RADIAL FRACTURE Left 09/20/2018   Procedure: OPEN REDUCTION INTERNAL FIXATION (ORIF) DISTAL RADIAL FRACTURE, LEFT;  Surgeon: Molli Angelucci, MD;  Location: [facility];  Service: Orthopedics;  Laterality: Left;   PLANTAR FASCIA SURGERY  1999   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   VAGINAL HYSTERECTOMY  2001   for bleeding and LSO for cyst    Prior to Admission medications   Medication Sig Start Date End Date Taking? Authorizing Provider  albuterol  (VENTOLIN  HFA) 108 (90 Base) MCG/ACT inhaler Inhale 2 puffs into the lungs every 6 (six) hours as needed for wheezing or shortness of breath. Patient not taking: Reported on 08/07/2023 10/17/22   Marcina Severe, PA-C  ALPRAZolam  (XANAX ) 0.5 MG tablet Take 1 tablet (0.5 mg total) by mouth 2 (two) times daily as needed. for anxiety 11/09/23   Marcina Severe, PA-C  aspirin 81 MG chewable tablet Chew by mouth. 03/23/23   [provider]  Continuous Blood Gluc Receiver (DEXCOM G7 RECEIVER) DEVI Use to check glucose as directed Patient not taking: Reported on 08/07/2023 02/17/22    Nida, Gebreselassie W, MD  cyanocobalamin  (VITAMIN B12) 1000 MCG/ML injection Inject 1 mL (1,000 mcg total) into the muscle every 14 (fourteen) days. 08/14/23   Marcina Severe, PA-C  EPINEPHrine  0.3 mg/0.3 mL IJ SOAJ injection Inject 0.3 mg into the muscle as needed. 12/20/22   Scarboro, Adam J, NP  fluticasone  (FLONASE ) 50 MCG/ACT nasal spray Place 1 spray into both nostrils in the morning and at bedtime. Patient not taking: Reported on 12/20/2022 10/31/22 10/31/23  Vergia Glasgow, MD  levothyroxine  (SYNTHROID ) 100 MCG tablet TAKE ONE TABLET EACH MORNING BEFORE BREAKFAST 07/18/23   Marcina Severe, PA-C  losartan (COZAAR) 25 MG tablet Take 25 mg by mouth daily. 05/01/23   [provider]  losartan (COZAAR) 50 MG tablet Take 50 mg by mouth daily. 11/14/23   [provider]  meloxicam (MOBIC) 15 MG tablet Take by mouth daily as needed for pain.    [provider]  metoprolol  tartrate (LOPRESSOR ) 50 MG tablet Take 1 tablet by mouth 2 (two) times daily. 06/14/23 06/13/24  [provider]  MULTAQ 400 MG tablet Take 400 mg by mouth 2 (two) times daily. 12/06/23   [provider]  simvastatin  (ZOCOR ) 40 MG tablet TAKE ONE TABLET BY MOUTH AT BEDTIME 06/30/23   Ilena Dieckman K, PA-C  sodium chloride  (OCEAN) 0.65 % SOLN nasal spray Place 2 sprays into both nostrils in the morning and at bedtime. 10/31/22   Vergia Glasgow, MD  Vitamin D , Ergocalciferol , (DRISDOL ) 1.25 MG (50000 UNIT) CAPS capsule Take 1 capsule (50,000 Units total) by mouth every 7 (seven) days. 08/14/23   Marcina Severe, PA-C  zolpidem  (AMBIEN  CR) 12.5 MG CR tablet Take 1 tablet (12.5 mg total) by mouth at bedtime as needed for sleep. 10/12/23   Marcina Severe, PA-C    Allergies Bee venom, Amoxicillin , Citalopram, Lactose intolerance (gi), Other, and Tape  Family History  Problem Relation Age of Onset   Heart disease Mother    Lung cancer Mother    Diabetes Mother        later in life   Coronary artery  disease Mother    Osteoporosis Mother    Diabetes Father    Heart disease Father    Arrhythmia Sister    Breast cancer Sister 35       diagnosed 09/09/2016   Colonic polyp Brother        pre cancer    Social History Social History   Tobacco Use   Smoking status: Former    Current packs/day: 0.00    Average packs/day: 0.3 packs/day for 4.0 years (1.2 ttl pk-yrs)    Types: Cigarettes    Start date: 07/11/1998    Quit date: 07/11/2002    Years since quitting: 21.4    Passive exposure: Never   Smokeless tobacco: Never  Vaping Use   Vaping status: Never Used  Substance Use Topics   Alcohol use: Not Currently    Alcohol/week: 1.0 standard drink of alcohol    Types: 1 Standard drinks or equivalent per week   Drug use: No    Review of Systems Constitutional: No fever/chills Eyes: No visual changes. ENT: No sore throat. Cardiovascular: Denies chest pain. Respiratory: Denies shortness of breath. Gastrointestinal: No abdominal pain.  No nausea, no vomiting.  No diarrhea.  No constipation. Genitourinary: Negative for dysuria. Musculoskeletal: Negative for back pain. Skin: Negative for rash. Neurological: Negative for headaches, focal weakness or numbness. Psychiatric: Anxiety and insomnia Endocrine: Hyperlipidemia, hypertension, hypothyroidism and prediabetes  Allergic/Immunilogical: Amoxicillin , bee venom, and lactose intolerance.  ____________________________________________   PHYSICAL EXAM:  VITAL SIGNS:  Constitutional: Alert and oriented. Well appearing and in no acute distress.  Obesity Eyes: Conjunctivae are normal. PERRL. EOMI. Head: Atraumatic. Nose: No congestion/rhinnorhea. Mouth/Throat: Mucous membranes are moist.  Oropharynx non-erythematous. Neck: No stridor. No cervical spine tenderness to palpation. Hematological/Lymphatic/Immunilogical: No cervical lymphadenopathy. Cardiovascular: Normal rate, regular rhythm. Grossly normal heart sounds.  Good peripheral  circulation. Respiratory: Normal respiratory effort.  No retractions. Lungs CTAB. Gastrointestinal: Soft and nontender. No distention. No abdominal bruits. No CVA tenderness. Genitourinary: Deferred Musculoskeletal: No lower extremity tenderness nor edema.  No joint effusions. Neurologic:  Normal speech and language. No gross focal neurologic deficits are appreciated. No gait instability. Skin:  Skin is warm, dry and intact. No rash noted. Psychiatric: Mood and affect are normal. Speech and behavior are normal.  ____________________________________________   LABS __  Component Ref Range & Units (hover) 10 d ago 1 yr ago 2 yr ago 3 yr ago 4 yr ago  Color, UA yellow Yellow Amber yellow Light Yellow  Clarity, UA clear Clear Clear clear Clear  Glucose, UA Negative Negative Negative Negative Negative  Bilirubin, UA neg Negative Negative negative Negative  Ketones, UA neg Negative Negative negative Negative  Spec Grav, UA 1.025 1.020 1.025 1.025 1.010  Blood, UA neg Negative Negative negative Negative  pH, UA 5.5 6.0 6.0 6.0 6.0  Protein, UA Positive Abnormal  Negative Negative Negative Negative  Comment: trace -+  Urobilinogen, UA 0.2 0.2 0.2 0.2 0.2  Nitrite, UA neg Negative Negative negative Negative  Leukocytes, UA Negative Negative Negative Negative Negative  Appearance    medium   Odor           TSH 3.050 3.640 3.590 3.900   3.450  T4, Total 12.1 High  10.7 10.3 9.7   11.2  T3 Uptake Ratio 28 29 29 24   29   Free Thyroxine Index 3.4 3.1 3.0 2.3   3.2  WBC 5.5 5.3 4.8 5.0  8.7 6.8  RBC 4.83 4.98 4.99 4.82  5.19 5.11  Hemoglobin 14.0 13.9 13.8 13.3  14.1 14.2  Hematocrit 41.7 42.6 40.7 39.0  42.8 43.0  MCV 86 86 82 81  83 84  MCH 29.0 27.9 27.7 27.6  27.2 27.8  MCHC 33.6 32.6 33.9 34.1  32.9 33.0  RDW 14.5 14.9 14.8 14.9  14.8 15.1  Platelets 179 194 178 187  211 195  Neutrophils 64 60 61 65  65 64  Lymphs 26 29 28 26  25 26   Monocytes 8 9 8 7  6 8   Eos 1 1 2 1  2  1   Basos 1 1 1 1  1 1   Neutrophils Absolute 3.5 3.2 3.0 3.2  5.8 4.4  Lymphocytes Absolute 1.4 1.5 1.3 1.3  2.2 1.8  Monocytes Absolute 0.5 0.5 0.4 0.4  0.5 0.5  EOS (ABSOLUTE) 0.1 0.1 0.1 0.1  0.1 0.1  Basophils Absolute 0.0 0.1 0.1 0.0  0.1 0.1  Immature Granulocytes 0 0 0 0  1 0  Immature Grans (Abs) 0.0 0.0 0.0 0.0  0.1 0.0                       Component Ref Range & Units (hover) 10 d ago (12/01/23) 1 yr ago (12/09/22) 1 yr ago (06/16/22) 2 yr ago (09/09/21) 3 yr ago (11/13/20) 3 yr ago (11/13/20) 3 yr ago (08/28/20)  Glucose 112 High  99 109 High  97 90 R  100 High  R  Uric Acid 4.5 4.7 CM 4.4 CM 4.3 CM   4.3 CM  Comment:            Therapeutic target for gout patients: <6.0  BUN 13 14 12 19 14  R  12 R  Creatinine, Ser 0.62 0.68 0.66 0.69 0.88  0.68  eGFR 101 99 100 99 76    BUN/Creatinine Ratio 21 21 18 28 16  R  18 R  Sodium 142 139 140 141 139  140  Potassium 4.3 4.3 4.1 4.2 4.6  4.0  Chloride 105 104 105 107 High  103  104  Calcium 9.0 9.0 9.1 9.0 9.5 R  9.3 R  Phosphorus 2.7 Low  2.9 Low  3.1 3.1   3.1  Total Protein 6.4 6.5 6.2 6.4 6.9  6.4  Albumin 4.3 4.0 4.0  4.2 R 4.0 R  4.1 R  Globulin, Total 2.1 2.5 2.2 2.2 2.9  2.3  Bilirubin Total 0.3 0.3 0.3 0.3 0.3  0.3  Alkaline Phosphatase 122 High  127 High  119 119 120  121  LDH 191 172 162 179   204  AST 17 15 14 14 12  12   ALT 17 15 14 12 19  14   GGT 16 14 12 14   15   Iron 87 78 65 70 R   72 R  Cholesterol, Total 198 253 High  271 High  245 High    244 High   Triglycerides 88 90 93 99   102  HDL 79 74 75 73   75  VLDL Cholesterol Cal 16 15 16 17   18   LDL Chol Calc (NIH) 103 High  164 High  180 High  155 High    151 High   Chol/HDL Ratio 2.5 3.4 CM 3.6 CM 3.4 CM   3.3 CM                      Component Ref Range & Units (hover) 10 d ago (12/01/23) 1 yr ago (12/09/22) 1 yr ago (06/16/22) 1 yr ago (02/03/22) 3 yr ago (01/08/20)  Hgb A1c MFr Bld 6.2 High  6.2 High  CM 6.4 High  CM 6.3 High  CM 6.0 High  CM   Comment:          Prediabetes: 5.7 - 6.4          Diabetes: >6.4          Glycemic control for adults with diabetes: <7.0                __________________________________________  EKG  Sinus rhythm at 63 bpm ____________________________________________   ____________________________________________   INITIAL IMPRESSION / ASSESSMENT AND PLAN As part of my medical decision making, I reviewed the following data within the electronic MEDICAL RECORD NUMBER      No acute findings on physical exam except for obesity.  Patient pulse today was 57.  Advised patient to discuss dosage of Maltaq once a day versus twice a day.  Patient also discussed with cardiologist as her heart condition needs prerequisites for comorbidity to consider Ozempic.    ____________________________________________   FINAL CLINICAL IMPRESSION Annual physical exam with bradycardia.   ED Discharge Orders     None        Note:  This document was prepared using Dragon voice recognition software and may include unintentional dictation errors.

## 2023-12-11 NOTE — Progress Notes (Signed)
 Here for yearly physical.  Concerns of increased weight and questions of whether she can take Phentermine  again as she had stopped during times of tachycardia in the past and now having bradycardia.  Recently started Multaq and was supposed to have an ablation, but didn't go into tach while there reportedly even with Epi and stated noted with start of Multaq and con't betablocker she notes tracking pulse in high 50's.

## 2023-12-19 ENCOUNTER — Encounter: Payer: Self-pay | Admitting: Physician Assistant

## 2023-12-20 ENCOUNTER — Other Ambulatory Visit: Payer: Self-pay | Admitting: Physician Assistant

## 2023-12-20 MED ORDER — OZEMPIC (0.25 OR 0.5 MG/DOSE) 2 MG/3ML ~~LOC~~ SOPN: 0.2500 mg | PEN_INJECTOR | SUBCUTANEOUS | 0 refills | Status: DC

## 2024-01-17 ENCOUNTER — Ambulatory Visit: Payer: Self-pay | Admitting: Physician Assistant

## 2024-01-17 ENCOUNTER — Encounter: Payer: Self-pay | Admitting: Physician Assistant

## 2024-01-17 DIAGNOSIS — F411 Generalized anxiety disorder: Secondary | ICD-10-CM

## 2024-01-17 MED ORDER — ALPRAZOLAM 0.5 MG PO TABS: 0.5000 mg | ORAL_TABLET | Freq: Two times a day (BID) | ORAL | 0 refills | Status: DC | PRN

## 2024-01-17 MED ORDER — OZEMPIC (0.25 OR 0.5 MG/DOSE) 2 MG/3ML ~~LOC~~ SOPN: 0.5000 mg | PEN_INJECTOR | SUBCUTANEOUS | 0 refills | Status: DC

## 2024-01-17 NOTE — Progress Notes (Signed)
   Subjective: Weight management    Patient ID: Mary [name], female    DOB: Mar 21, 1961, 63 y.o.   MRN: [id number]  HPI Patient presents for weight management evaluation.  Patient started Ozempic  at 0.25 last month.  States 2 pound weight loss.  States mild GI bleeding.  Patient also requests refill of his Xanax .   Review of Systems Anxiety, hypertension, obstructive sleep, and hypothyroidism.    Objective:   Physical Exam BP 168/86  BP Location Left Arm  Patient Position Sitting  Cuff Size Large  Pulse Rate 62  Temp 97.1 F (36.2 C)  Temp Source Temporal  Weight 295 lb (133.8 kg)  Height 5' 6 (1.676 m)  Resp 14  SpO2 97 %   BMI: 47.61 kg/m2  BSA: 2.50 m2         Assessment & Plan: Weight management  Patient Ozempic  increased to 0.5 mg weekly.  Patient will follow-up in 1 month.  Xanax  prescription refilled.

## 2024-01-17 NOTE — Progress Notes (Signed)
 Wt = 297 on 12/11/2023  C/O feeling fatigued - ? If R/T new med (Multaq) or low Vitamin D  level Cardiologist put her on Multaq b/c unable to ablate the area of the heart causing tachycardia  States she's experienced some gas & bloating since starting the Ozempic  - takes GasX & it helps Also reports she can tell a difference in her portion control

## 2024-02-05 ENCOUNTER — Encounter: Payer: Self-pay | Admitting: Physician Assistant

## 2024-02-05 ENCOUNTER — Ambulatory Visit: Payer: Self-pay | Admitting: Physician Assistant

## 2024-02-05 VITALS — BP 133/76 | HR 67 | Temp 97.6°F | Resp 16

## 2024-02-05 DIAGNOSIS — R051 Acute cough: Secondary | ICD-10-CM

## 2024-02-05 MED ORDER — HYDROCOD POLI-CHLORPHE POLI ER 10-8 MG/5ML PO SUER: 5.0000 mL | Freq: Every evening | ORAL | 0 refills | Status: DC | PRN

## 2024-02-05 NOTE — Progress Notes (Signed)
 Pt presents today with cough, sore throat, drainage, back muscles and  left rib sore from cough since Friday. Pt states she also has lymph node swollen under left arm pit.

## 2024-02-05 NOTE — Progress Notes (Signed)
   Subjective: Nonproductive cough    Patient ID: Mary [name], female    DOB: 06/22/1961, 63 y.o.   MRN: [id number]  HPI Patient has 3 days of nonproductive cough.  Patient states cough is worse when laying down.  Denies fever chills associated with complaint.  Denies recent travel or known contact with COVID-19.  Spouse had the same symptoms and is improving.  Recently seen for SVT.  Review of Systems Anxiety, hypertension, hypothyroidism, and SVT.    Objective:   Physical Exam BP 133/76  Cuff Size --  Pulse Rate 67  Temp 97.6 F (36.4 C)  Temp Source Temporal  Resp 16  SpO2 96 %  HEENT is unremarkable except for postnasal drainage. Neck is supple for lymphadenopathy or bruits. Lungs are clear to auscultation.  Increased cough with deep respirations. Heart is regular rate and rhythm.       Assessment & Plan: Cough  Advised over-the-counter Benadryl, take Tessalon  Perles during the day.  Take Tussionex at night.  Follow-up as needed.

## 2024-02-14 ENCOUNTER — Encounter: Payer: Self-pay | Admitting: Physician Assistant

## 2024-02-14 ENCOUNTER — Other Ambulatory Visit: Payer: Self-pay | Admitting: Physician Assistant

## 2024-02-14 ENCOUNTER — Ambulatory Visit: Payer: Self-pay | Admitting: Physician Assistant

## 2024-02-14 ENCOUNTER — Ambulatory Visit
Admission: RE | Admit: 2024-02-14 | Discharge: 2024-02-14 | Disposition: A | Discharge status: Home / Self Care | Source: Ambulatory Visit | Attending: Physician Assistant | Admitting: Physician Assistant

## 2024-02-14 ENCOUNTER — Ambulatory Visit
Admission: RE | Admit: 2024-02-14 | Discharge: 2024-02-14 | Disposition: A | Discharge status: Home / Self Care | Attending: Physician Assistant | Admitting: Physician Assistant

## 2024-02-14 VITALS — BP 126/71 | HR 58 | Temp 97.0°F | Resp 14 | Wt 291.0 lb

## 2024-02-14 DIAGNOSIS — R053 Chronic cough: Secondary | ICD-10-CM | POA: Diagnosis present

## 2024-02-14 DIAGNOSIS — Z7689 Persons encountering health services in other specified circumstances: Secondary | ICD-10-CM

## 2024-02-14 MED ORDER — SEMAGLUTIDE (1 MG/DOSE) 4 MG/3ML ~~LOC~~ SOPN
1.0000 mg | PEN_INJECTOR | SUBCUTANEOUS | 0 refills | Status: DC
Start: 2024-02-14 — End: 2024-03-20

## 2024-02-14 MED ORDER — SEMAGLUTIDE (1 MG/DOSE) 4 MG/3ML ~~LOC~~ SOPN
1.0000 mg | PEN_INJECTOR | SUBCUTANEOUS | 2 refills | Status: DC
Start: 2024-02-14 — End: 2024-02-14

## 2024-02-14 MED ORDER — METHYLPREDNISOLONE 4 MG PO TBPK
ORAL_TABLET | ORAL | 0 refills | Status: AC
Start: 2024-02-14 — End: ?

## 2024-02-14 MED ORDER — HYDROCOD POLI-CHLORPHE POLI ER 10-8 MG/5ML PO SUER: 5.0000 mL | Freq: Every evening | ORAL | 0 refills | Status: DC | PRN

## 2024-02-14 NOTE — Progress Notes (Signed)
   Subjective: Cough and weight management    Patient ID: Mary [name], female    DOB: September 24, 1960, 63 y.o.   MRN: [id number]  HPI Patient follow-up for chronic cough for 2 weeks.  Patient states only mild relief with Tussionex.  Denies fever chills associated with complaint.  States cough is productive.  Patient also requests refill of Ozempic  for weight management.  States she is tolerating medication well with only mild nausea..   Review of Systems Hypertension, SVT, sleep apnea, reactive hypoglycemia, and hypothyroidism.    Objective:   Physical Exam BP 126/71  BP Location Left Arm  Patient Position Sitting  Cuff Size Large  Pulse Rate 58  Temp 97 F (36.1 C)  Temp Source Temporal  Weight 291 lb (132 kg)  Resp 14  SpO2 95 %   BMI: 46.97 kg/m2  BSA: 2.48 m2  HEENT is unremarkable. Neck is supple for lymphadenopathy or bruits. Lungs are clear to auscultation however increased cough with deep inspirations. Heart regular rate and rhythm.       Assessment & Plan: Chronic cough and weight management.     Patient sent to imaging center for chest x-ray.  Patient given prescription of Medrol  Dosepak and refill Tussionex.  Patient Ozempic  was increased to 1 mg weekly.  Follow-up status post x-ray.

## 2024-02-14 NOTE — Progress Notes (Signed)
 Lost 4 lbs since 01/17/2024   States still doing good with ozempic  A few episodes of feeling bloated - takes Gas-x & it manages it A few episode of nausea but no vomiting - doesn't last long  Was using Tussionex until Rx ran out - takes at night because it will knock her out   Took some Benadryl - states it knocked her out  States needs Rx for Ozempic  [address])L  Last week Ron prescribed Tussionex & Mucinex for URI States still has deep cough & it's productive

## 2024-02-15 ENCOUNTER — Ambulatory Visit: Payer: Self-pay | Admitting: Physician Assistant

## 2024-03-20 ENCOUNTER — Ambulatory Visit: Payer: Self-pay | Admitting: Physician Assistant

## 2024-03-20 ENCOUNTER — Encounter: Payer: Self-pay | Admitting: Physician Assistant

## 2024-03-20 DIAGNOSIS — F411 Generalized anxiety disorder: Secondary | ICD-10-CM

## 2024-03-20 MED ORDER — ONDANSETRON HCL 4 MG PO TABS: 4.0000 mg | ORAL_TABLET | Freq: Three times a day (TID) | ORAL | 0 refills | Status: AC | PRN

## 2024-03-20 MED ORDER — ALPRAZOLAM 0.5 MG PO TABS: 0.5000 mg | ORAL_TABLET | Freq: Two times a day (BID) | ORAL | 0 refills | Status: DC | PRN

## 2024-03-20 MED ORDER — SEMAGLUTIDE (2 MG/DOSE) 8 MG/3ML ~~LOC~~ SOPN: 2.0000 mg | PEN_INJECTOR | SUBCUTANEOUS | 2 refills | Status: DC

## 2024-03-20 NOTE — Progress Notes (Signed)
 Pt presents today for increase dose of ozempic . Previous weight 291lb. Today 285lb

## 2024-03-20 NOTE — Progress Notes (Signed)
   Subjective: Weight management    Patient ID: Mary [name], female    DOB: [birth year], 63 y.o.   MRN: [id number]  HPI Patient presents for weight management.  Patient is currently using Ozempic .  Patient has lost 12 pounds in the past 3 months.  States she is experiencing mild nausea with the medication.  Patient also has altered diet and increase physical activities.   Review of Systems Anxiety/panic attacks, chronic venous insufficiency, hypothyroidism, hypertension, sleep apnea, and SVT.    Objective:   Physical Exam  03/20/2024   11:18 AM    BP 140/68  Cuff Size Large  Pulse Rate 77  Weight 285 lb (129.3 kg)  Height 5' 6.5 (1.689 m)  Resp 16  SpO2 95 %   BMI: 45.31 kg/m2  BSA: 2.46 m2         Assessment & Plan: Weight management  Patient presenting increased to 2 mg.  Patient given a prescription for Xanax  and Zofran .  Patient will follow-up in 3 months unless condition worsens.

## 2024-03-22 ENCOUNTER — Other Ambulatory Visit: Payer: Self-pay | Admitting: Physician Assistant

## 2024-03-22 MED ORDER — PROMETHAZINE HCL 25 MG PO TABS: 25.0000 mg | ORAL_TABLET | Freq: Four times a day (QID) | ORAL | 0 refills | Status: AC | PRN

## 2024-04-12 ENCOUNTER — Other Ambulatory Visit: Payer: Self-pay | Admitting: Physician Assistant

## 2024-04-12 DIAGNOSIS — G4700 Insomnia, unspecified: Secondary | ICD-10-CM

## 2024-05-23 ENCOUNTER — Other Ambulatory Visit: Payer: Self-pay | Admitting: Physician Assistant

## 2024-05-23 DIAGNOSIS — F411 Generalized anxiety disorder: Secondary | ICD-10-CM

## 2024-06-10 ENCOUNTER — Other Ambulatory Visit: Payer: Self-pay

## 2024-06-10 DIAGNOSIS — Z6841 Body Mass Index (BMI) 40.0 and over, adult: Secondary | ICD-10-CM

## 2024-06-10 DIAGNOSIS — R7309 Other abnormal glucose: Secondary | ICD-10-CM

## 2024-06-10 DIAGNOSIS — I890 Lymphedema, not elsewhere classified: Secondary | ICD-10-CM

## 2024-06-10 DIAGNOSIS — R7303 Prediabetes: Secondary | ICD-10-CM

## 2024-06-10 MED ORDER — SEMAGLUTIDE (2 MG/DOSE) 8 MG/3ML ~~LOC~~ SOPN: 2.0000 mg | PEN_INJECTOR | SUBCUTANEOUS | 2 refills | Status: DC

## 2024-06-19 ENCOUNTER — Other Ambulatory Visit: Payer: Self-pay | Admitting: Physician Assistant

## 2024-06-19 DIAGNOSIS — E079 Disorder of thyroid, unspecified: Secondary | ICD-10-CM

## 2024-07-10 ENCOUNTER — Other Ambulatory Visit: Payer: Self-pay | Admitting: Physician Assistant

## 2024-07-10 DIAGNOSIS — G4700 Insomnia, unspecified: Secondary | ICD-10-CM

## 2024-08-02 ENCOUNTER — Telehealth: Payer: Self-pay

## 2024-08-02 ENCOUNTER — Other Ambulatory Visit: Payer: Self-pay | Admitting: Physician Assistant

## 2024-08-02 DIAGNOSIS — R7303 Prediabetes: Secondary | ICD-10-CM

## 2024-08-02 DIAGNOSIS — R7309 Other abnormal glucose: Secondary | ICD-10-CM

## 2024-08-02 DIAGNOSIS — Z6841 Body Mass Index (BMI) 40.0 and over, adult: Secondary | ICD-10-CM

## 2024-08-02 DIAGNOSIS — I890 Lymphedema, not elsewhere classified: Secondary | ICD-10-CM

## 2024-08-02 MED ORDER — CLARITHROMYCIN 500 MG PO TABS: 500.0000 mg | ORAL_TABLET | Freq: Two times a day (BID) | ORAL | 0 refills | Status: AC

## 2024-08-02 MED ORDER — HYDROCOD POLI-CHLORPHE POLI ER 10-8 MG/5ML PO SUER: 5.0000 mL | Freq: Two times a day (BID) | ORAL | 0 refills | Status: AC | PRN

## 2024-08-02 NOTE — Telephone Encounter (Signed)
 Mary [name] states she called the clinic on Wednesday C/O Left ear discomfort (stopped up & couldn't clear it), left sided sore throat, sneezing & runny nose (clear drainage) that all started on Saturday (07/27/24) night.  Wast told to try Claritin  & Pseudoephedrine.  Called back today & states she took the Claritin  & Pseudoephedrine.  The Left ear & left side of throat have improved but now the right side is bothering her.  Current S/Sx: Cough - dry - keeping her awake at night Right ear feels full & congested Right side of throat feels sore Runny nose - Clear drainage Headache Afebrile  Denies: No Teeth/jaw discomfort No Fever No SOB No N/V/D  OTC Meds: Claritin  & Pseudoephedrine 2x/day Tylenol  500 mg 2x/day (States can't take NSAIDS) Nyquil Saline Rinses Warm tea  Worried that her symptoms will worsen over the weekend & worried about the bad weather forecast & that she will not be able to get out of her house if we get all the snow & ice that's predicted for her.  Worried because her URI's get really bad quickly.  Last treated in 02/2024 for lingering cough & sent to [facility] for CXR.  Can you prescribe her something?  Cough is really bothering her especially at night.  I did encourage her to add honey to her tea to help soothe the throat & cough and/or lidocaine  throat lozenges or throat spray.
